# Patient Record
Sex: Female | Born: 1935 | Race: White | Hispanic: No | Smoking: Never smoker
Health system: Southern US, Community
[De-identification: ages and names within clinical notes are randomized; demographics above are authoritative.]

## PROBLEM LIST (undated history)

## (undated) DIAGNOSIS — I252 Old myocardial infarction: Secondary | ICD-10-CM

## (undated) DIAGNOSIS — I2510 Atherosclerotic heart disease of native coronary artery without angina pectoris: Secondary | ICD-10-CM

## (undated) DIAGNOSIS — E785 Hyperlipidemia, unspecified: Secondary | ICD-10-CM

## (undated) DIAGNOSIS — I10 Essential (primary) hypertension: Secondary | ICD-10-CM

## (undated) DIAGNOSIS — C4490 Unspecified malignant neoplasm of skin, unspecified: Secondary | ICD-10-CM

## (undated) DIAGNOSIS — R109 Unspecified abdominal pain: Secondary | ICD-10-CM

## (undated) DIAGNOSIS — J189 Pneumonia, unspecified organism: Secondary | ICD-10-CM

## (undated) DIAGNOSIS — D649 Anemia, unspecified: Secondary | ICD-10-CM

## (undated) DIAGNOSIS — I5030 Unspecified diastolic (congestive) heart failure: Secondary | ICD-10-CM

## (undated) DIAGNOSIS — I255 Ischemic cardiomyopathy: Secondary | ICD-10-CM

## (undated) DIAGNOSIS — N189 Chronic kidney disease, unspecified: Secondary | ICD-10-CM

## (undated) DIAGNOSIS — M1990 Unspecified osteoarthritis, unspecified site: Secondary | ICD-10-CM

## (undated) DIAGNOSIS — I499 Cardiac arrhythmia, unspecified: Secondary | ICD-10-CM

## (undated) DIAGNOSIS — G40909 Epilepsy, unspecified, not intractable, without status epilepticus: Secondary | ICD-10-CM

## (undated) DIAGNOSIS — G4733 Obstructive sleep apnea (adult) (pediatric): Secondary | ICD-10-CM

## (undated) HISTORY — DX: Ischemic cardiomyopathy: I25.5

## (undated) HISTORY — PX: EYE SURGERY: SHX253

## (undated) HISTORY — DX: Unspecified malignant neoplasm of skin, unspecified: C44.90

## (undated) HISTORY — DX: Unspecified abdominal pain: R10.9

## (undated) HISTORY — PX: KNEE SURGERY: SHX244

## (undated) HISTORY — DX: Obstructive sleep apnea (adult) (pediatric): G47.33

## (undated) HISTORY — DX: Cardiac arrhythmia, unspecified: I49.9

## (undated) HISTORY — PX: TOTAL ABDOMINAL HYSTERECTOMY: SHX209

## (undated) HISTORY — DX: Essential (primary) hypertension: I10

## (undated) HISTORY — PX: BACK SURGERY: SHX140

## (undated) HISTORY — PX: WRIST FRACTURE SURGERY: SHX121

## (undated) HISTORY — PX: CHOLECYSTECTOMY: SHX55

## (undated) HISTORY — DX: Old myocardial infarction: I25.2

---

## 1999-10-25 ENCOUNTER — Encounter: Admission: RE | Admit: 1999-10-25 | Discharge: 1999-10-25 | Payer: Self-pay | Admitting: Family Medicine

## 1999-10-25 ENCOUNTER — Encounter: Payer: Self-pay | Admitting: Family Medicine

## 2000-02-08 ENCOUNTER — Inpatient Hospital Stay (HOSPITAL_COMMUNITY): Admission: EM | Admit: 2000-02-08 | Discharge: 2000-02-13 | Payer: Self-pay | Admitting: Emergency Medicine

## 2000-02-08 ENCOUNTER — Encounter: Payer: Self-pay | Admitting: Emergency Medicine

## 2000-02-08 ENCOUNTER — Encounter (INDEPENDENT_AMBULATORY_CARE_PROVIDER_SITE_OTHER): Payer: Self-pay | Admitting: Specialist

## 2000-02-10 ENCOUNTER — Encounter: Payer: Self-pay | Admitting: Cardiology

## 2001-10-11 ENCOUNTER — Encounter: Admission: RE | Admit: 2001-10-11 | Discharge: 2001-10-11 | Payer: Self-pay | Admitting: Family Medicine

## 2001-10-11 ENCOUNTER — Encounter: Payer: Self-pay | Admitting: Family Medicine

## 2004-05-09 ENCOUNTER — Inpatient Hospital Stay (HOSPITAL_COMMUNITY): Admission: EM | Admit: 2004-05-09 | Discharge: 2004-05-12 | Payer: Self-pay | Admitting: Emergency Medicine

## 2005-02-16 ENCOUNTER — Encounter: Payer: Self-pay | Admitting: Pulmonary Disease

## 2006-01-22 ENCOUNTER — Ambulatory Visit: Payer: Self-pay | Admitting: Pulmonary Disease

## 2006-09-10 ENCOUNTER — Encounter: Admission: RE | Admit: 2006-09-10 | Discharge: 2006-10-09 | Payer: Self-pay | Admitting: Orthopedic Surgery

## 2006-10-22 ENCOUNTER — Ambulatory Visit (HOSPITAL_COMMUNITY): Admission: RE | Admit: 2006-10-22 | Discharge: 2006-10-23 | Payer: Self-pay | Admitting: Orthopedic Surgery

## 2008-05-22 ENCOUNTER — Encounter: Admission: RE | Admit: 2008-05-22 | Discharge: 2008-05-22 | Payer: Self-pay | Admitting: Family Medicine

## 2008-05-22 ENCOUNTER — Encounter: Payer: Self-pay | Admitting: Gastroenterology

## 2008-11-10 ENCOUNTER — Telehealth (INDEPENDENT_AMBULATORY_CARE_PROVIDER_SITE_OTHER): Payer: Self-pay | Admitting: *Deleted

## 2008-11-10 ENCOUNTER — Ambulatory Visit: Payer: Self-pay | Admitting: Gastroenterology

## 2008-11-10 DIAGNOSIS — R109 Unspecified abdominal pain: Secondary | ICD-10-CM

## 2008-11-11 ENCOUNTER — Encounter (INDEPENDENT_AMBULATORY_CARE_PROVIDER_SITE_OTHER): Payer: Self-pay | Admitting: *Deleted

## 2008-11-11 LAB — CONVERTED CEMR LAB
Albumin: 4 g/dL (ref 3.5–5.2)
Alkaline Phosphatase: 118 units/L — ABNORMAL HIGH (ref 39–117)
Eosinophils Relative: 1.6 % (ref 0.0–5.0)
Glucose, Bld: 112 mg/dL — ABNORMAL HIGH (ref 70–99)
MCV: 94.7 fL (ref 78.0–100.0)
Monocytes Absolute: 0.2 10*3/uL (ref 0.1–1.0)
Monocytes Relative: 3.9 % (ref 3.0–12.0)
Neutrophils Relative %: 74.2 % (ref 43.0–77.0)
Platelets: 158 10*3/uL (ref 150.0–400.0)
Potassium: 4.5 meq/L (ref 3.5–5.1)
Sodium: 143 meq/L (ref 135–145)
Total Protein: 7.5 g/dL (ref 6.0–8.3)
WBC: 5.4 10*3/uL (ref 4.5–10.5)

## 2008-11-19 ENCOUNTER — Encounter (INDEPENDENT_AMBULATORY_CARE_PROVIDER_SITE_OTHER): Payer: Self-pay | Admitting: *Deleted

## 2008-11-19 ENCOUNTER — Ambulatory Visit (HOSPITAL_COMMUNITY): Admission: RE | Admit: 2008-11-19 | Discharge: 2008-11-19 | Payer: Self-pay | Admitting: Gastroenterology

## 2008-11-19 ENCOUNTER — Ambulatory Visit: Payer: Self-pay | Admitting: Gastroenterology

## 2008-12-18 ENCOUNTER — Encounter: Admission: RE | Admit: 2008-12-18 | Discharge: 2008-12-18 | Payer: Self-pay | Admitting: Orthopedic Surgery

## 2009-01-05 ENCOUNTER — Ambulatory Visit: Payer: Self-pay | Admitting: Gastroenterology

## 2009-02-03 ENCOUNTER — Ambulatory Visit (HOSPITAL_BASED_OUTPATIENT_CLINIC_OR_DEPARTMENT_OTHER): Admission: RE | Admit: 2009-02-03 | Discharge: 2009-02-03 | Payer: Self-pay | Admitting: Orthopedic Surgery

## 2009-03-02 ENCOUNTER — Encounter: Admission: RE | Admit: 2009-03-02 | Discharge: 2009-03-31 | Payer: Self-pay | Admitting: Orthopedic Surgery

## 2010-05-24 NOTE — Procedures (Signed)
Summary: COLON prep/[facility]  [facility] prep/[facility]   Imported By: Lester [place]ates 11/13/2008 08:01:13  _____________________________________________________________________  External Attachment:    Type:   Image     Comment:   External Document

## 2010-05-30 ENCOUNTER — Ambulatory Visit (INDEPENDENT_AMBULATORY_CARE_PROVIDER_SITE_OTHER): Payer: Medicare Other | Admitting: Pulmonary Disease

## 2010-05-30 ENCOUNTER — Encounter: Payer: Self-pay | Admitting: Pulmonary Disease

## 2010-05-30 DIAGNOSIS — I499 Cardiac arrhythmia, unspecified: Secondary | ICD-10-CM | POA: Insufficient documentation

## 2010-05-30 DIAGNOSIS — G4733 Obstructive sleep apnea (adult) (pediatric): Secondary | ICD-10-CM

## 2010-05-30 DIAGNOSIS — J309 Allergic rhinitis, unspecified: Secondary | ICD-10-CM | POA: Insufficient documentation

## 2010-05-30 DIAGNOSIS — Z85828 Personal history of other malignant neoplasm of skin: Secondary | ICD-10-CM | POA: Insufficient documentation

## 2010-05-30 DIAGNOSIS — G4731 Primary central sleep apnea: Secondary | ICD-10-CM | POA: Insufficient documentation

## 2010-05-30 DIAGNOSIS — I10 Essential (primary) hypertension: Secondary | ICD-10-CM | POA: Insufficient documentation

## 2010-06-15 NOTE — Assessment & Plan Note (Signed)
Summary: self referral for management of osa   Copy to:  n/a Primary Provider/Referring Provider:  Charlesetta Shanks, MD   CC:  Sleep Consult.  Former Pt. Was last seen Oct 2007. Marland Kitchen  History of Present Illness: The pt is a 77y/o female who comes in today as a self referral for management of osa.  She has a diagnosis of severe osa established in 2006, with AHI 84/hr and including some central apneas.  She was started on Adapt SV by the attending sleep center, but ultimately came to me for f/u at a later date.  I would typically not leave her on this type of machine, however the pt felt she was doing well and wanted to continue.  She has now been lossed to f/u for 4+ years, and comes in today where she is in need of a new machine.  She has kept up with her mask changes and maintenance faithfully.  She currently feels that she sleeps well with her device prior to it having functioning issues, and feels rested the following day.  She is satisfied with her daytime alertness, and has no issues with sleepiness unless she reads for hours during the middle of the day.  Her weight has been fairly stable since the last visit.    Current Medications (verified): 1)  Fish Oil   Oil (Fish Oil) .Marland Kitchen.. 1000 Mg By Mouth Once Daily 2)  Folic Acid 800 Mcg Tabs (Folic Acid) .Marland Kitchen.. 1 By Mouth Once Daily 3)  Caltrate 600+d 600-400 Mg-Unit Tabs (Calcium Carbonate-Vitamin D) .Marland Kitchen.. 1 By Mouth Once Daily 4)  Super Omega 3 500 Mg Caps (Omega-3 Fatty Acids) .... Take 1 Tablet By Mouth Once A Day 5)  Aspir-Low 81 Mg Tbec (Aspirin) .Marland Kitchen.. 1 By Mouth Once Daily 6)  Dilantin 100 Mg Caps (Phenytoin Sodium Extended) .... 2 By Mouth At Bedtime 7)  Lisinopril 20 Mg Tabs (Lisinopril) .Marland Kitchen.. 1 By Mouth Once Daily 8)  Oxybutynin Chloride 5 Mg Tabs (Oxybutynin Chloride) .Marland Kitchen.. 1 By Mouth Once Daily 9)  Allegra 180 Mg Tabs (Fexofenadine Hcl) .Marland Kitchen.. 1 By Mouth Once Daily 10)  Felodipine 5 Mg Xr24h-Tab (Felodipine) .Marland Kitchen.. 1 By Mouth Once Daily 11)  Vitamin D  (Ergocalciferol) 50000 Unit Caps (Ergocalciferol) .Marland Kitchen.. 1 By Mouth Per Week 12)  Coenzyme Q10 10 Mg Caps (Coenzyme Q10) .Marland Kitchen.. 1 By Mouth Once Daily 13)  Acidophilus  Caps (Lactobacillus) .... As Needed Usually By Mouth Once Daily 14)  Flonase 50 Mcg/act  Susp (Fluticasone Propionate) .... Two Puffs Each Nostril Daily  Allergies (verified): 1)  ! Codeine  Past History:  Past Medical History:  ALLERGIC RHINITIS (ICD-477.9) SKIN CANCER, HX OF (ICD-V10.83) HYPERTENSION (ICD-401.9) OBSTRUCTIVE SLEEP APNEA (ICD-327.23)--AHI 84/hr in 2006 IRREGULAR HEART RATE (ICD-427.9) ABDOMINAL PAIN OTHER SPECIFIED SITE (ICD-789.09)    Past Surgical History: Reviewed history from 11/10/2008 and no changes required. Back Surgery Cholecystectomy Hysterectomy   Family History: Reviewed history from 11/10/2008 and no changes required. heart disease: father cancer: mother and father (skin)   Social History: Reviewed history from 11/10/2008 and no changes required. she is married and lives with husband. , she has one son,  she never smoked cigarettes,  she does not drink alcohol. retired from [employer].   Review of Systems       The patient complains of irregular heartbeats, acid heartburn, indigestion, nasal congestion/difficulty breathing through nose, sneezing, and joint stiffness or pain.  The patient denies shortness of breath with activity, shortness of breath at rest, productive  cough, non-productive cough, coughing up blood, chest pain, loss of appetite, weight change, abdominal pain, difficulty swallowing, sore throat, tooth/dental problems, headaches, itching, ear ache, anxiety, depression, hand/feet swelling, rash, change in color of mucus, and fever.    Vital Signs:  Patient profile:   77 year old female Height:      62 inches Weight:      161.25 pounds BMI:     29.60 O2 Sat:      98 % on Room air Temp:     98.2 degrees F oral Pulse rate:   82 / minute BP sitting:   124 / 80   (left arm) Cuff size:   regular  Vitals Entered By: Arman Filter LPN (May 30, 2010 9:27 AM)  O2 Flow:  Room air CC: Sleep Consult.  Former Pt. Was last seen Oct 2007.  Comments Medications reviewed with patient Arman Filter LPN  May 30, 2010 9:28 AM    Physical Exam  General:  ow female in nad Eyes:  PERRLA and EOMI.   Nose:  patent without discharge no purulence Mouth:  moderate elongation of soft palate and uvula Neck:  no jvd, tmg, LN Lungs:  clear to auscultation Heart:  rrr, no mrg Abdomen:  soft and nontender, bs+ Extremities:  no significant edema or cyanosis  pulses intact distally Neurologic:  alert, does not appear sleepy, moves all 4.   Impression & Recommendations:  Problem # 1:  OBSTRUCTIVE SLEEP APNEA (ICD-327.23) the pt has known mixed apnea, and has done well with adapt sv device.  She is wearing this complianlty, and has been keeping up with her supplies.  She does have some daytime sleepiness, but is not adversely impacting her QOL. I have reminded her to see me yearly (have not seen since 2007), and also to work on weight loss.  Will send an order to her DME to replace her machine.  Medications Added to Medication List This Visit: 1)  Super Omega 3 500 Mg Caps (Omega-3 fatty acids) .... Take 1 tablet by mouth once a day 2)  Flonase 50 Mcg/act Susp (Fluticasone propionate) .... Two puffs each nostril daily  Other Orders: New Patient Level III (16109) DME Referral (DME)  Patient Instructions: 1)  work on weight loss 2)  will send an order to dme for a new machine 3)  try turning up heat on humidifier for nasal congestion at night to see if that helps 4)  followup with me in one year, but call if having issues.

## 2010-07-28 LAB — POCT I-STAT 4, (NA,K, GLUC, HGB,HCT)
Hemoglobin: 13.9 g/dL (ref 12.0–15.0)
Sodium: 141 mEq/L (ref 135–145)

## 2010-09-06 NOTE — Op Note (Signed)
[name], [name]             ACCOUNT NO.:  [id number]   MEDICAL RECORD NO.:  [id number]          PATIENT TYPE:  OIB   LOCATION:  1607                         FACILITY:  [facility]   PHYSICIAN:  Marlowe Kays, M.D.  DATE OF BIRTH:  1930/01/29   DATE OF PROCEDURE:  10/22/2006  DATE OF DISCHARGE:                               OPERATIVE REPORT   PREOPERATIVE DIAGNOSIS:  Painful right 2nd claw toe.   POSTOPERATIVE DIAGNOSIS:  Painful right 2nd claw toe.   OPERATION:  Correction of right 2nd claw toe with resection of the base  of the proximal phalanx and pin fixation.   SURGEON:  Marlowe Kays, M.D.   ASSISTANT:  Nurse.   ANESTHESIA:  Spinal.   PATHOLOGY AND JUSTIFICATION FOR PROCEDURE:  As stated in diagnosis.  She  had dorsal subluxation of the proximal phalanx on the 2nd metatarsal  head.   PROCEDURE:  Prophylactic antibiotics, satisfactory spinal anesthesia and  pneumatic tourniquet, right lower extremity was prepped with DuraPrep  from midcalf to toes after esmarching the leg out nonsterilely.  Foot  and ankle were then draped in a sterile field.  I made the dorsal  extensor splitting incision from the distal portion of the proximal  phalanx over the 1st metatarsal head.  The capsule of the MP joint was  opened and the proximal phalanx isolated with subperiosteal dissection.  Baby Homan retractors were placed beneath the proximal portion of the  proximal phalanx and I then used a micro saw to cut the proximal phalanx  and roughly junction mid proximal 3rds, which is what it took to correct  the deformity.  I had to take the base of proximal phalanx out piecemeal  because it was so fragile and would not hold clamps.  I checked to be  sure that there were no fragments of bone remaining.  The wound was then  irrigated well with sterile saline and soft tissues infiltrated and  additional spinal anesthetic with 0.5% plain Marcaine.  I then placed a  0.045 smooth K-wire from  proximal to distal and then distal to proximal,  stabilizing the 2nd toe in a corrected position.  I released the  tourniquet and the 2nd toe pinked up nicely.  The pin was bent and cut  and capped.  I then closed the incision with running 3-0 Vicryl in the  extensor tendon mechanism and interrupted 4-0 nylon in the skin and  subcutaneous tissue.  Betadine, Adaptic and dry sterile dressing were  applied.  She tolerated the procedure well and was taken to the recovery  room in satisfactory condition with no known complications.           ______________________________  Marlowe Kays, M.D.     JA/MEDQ  D:  10/22/2006  T:  10/23/2006  Job:  [id number]

## 2010-09-09 NOTE — Discharge Summary (Signed)
NAMEMENNA, ABELN             ACCOUNT NO.:  [id number]   MEDICAL RECORD NO.:  [id number]          PATIENT TYPE:  INP   LOCATION:  6733                         FACILITY:  [facility]   PHYSICIAN:  Melissa L. Ladona Ridgel, MD  DATE OF BIRTH:  04-24-1930   DATE OF ADMISSION:  05/09/2004  DATE OF DISCHARGE:  05/12/2004                                 DISCHARGE SUMMARY   DISCHARGE DIAGNOSES:  1.  Gastroenteritis, likely viral in etiology.  There still remain some      cultures pending which we will follow up as an outpatient.  The      patient's Clostridium difficile cultures have been.  The patient is      tolerating solid food and has no further symptoms of diarrhea, she was      actually able to have a formed stool.  She will be discharged to home on      no medications.  2.  Hypertension.  The patient was transiently kept off of her lisinopril      and did have some episodes of hypertension, systolically in the 170s.      With resumption of lisinopril 10 mg, her blood pressures have been      appropriate and stable in the 110-120 systolic range.  I would discharge      her home on lisinopril 10 mg.  At this time, she is unclear of the      dosing of her lisinopril - she suggests that perhaps it might be 30 mg      but since she is recently recovering from dehydration I would prefer to      keep her on a lower dose until she is back eating and drinking and      functioning at a normal level.  I would, therefore, ask her primary care      physician to up titrate her medications as needed.  3.  History of seizure disorder.  The patient states that she had head      trauma in the past and was prophylactically placed on a seizure      medication because of some mild jerking in her hands which was felt to      represent seizure activity.  During the course of the hospital stay she      initially was kept off of her Dilantin as her liver enzymes were      elevated into the 50s, AST and ALT.  The  Dilantin was resumed on the day      after admission and at this time her liver enzymes have returned to      normal.  I suspect that the LFT elevation was related to the nausea and      vomiting.  There is, however, a hepatitis panel pending to assess      whether or not this may have been a cause for her initial illness.  I      will follow up on this with her primary care physician.   DISCHARGE MEDICATIONS:  1.  Lisinopril 10 mg daily.  2.  Dilantin 200 mg p.o. q.h.s.   I will request the patient see her primary care physician, Dr. Modesto Charon, in the  next 1-2 weeks to follow up on this hospital admission.  She will be  instructed to return to her primary care physician's office or the emergency  room if she develops fevers, chills, nausea, vomiting or further diarrhea.   HISTORY OF PRESENT ILLNESS:  This is a 77 year old white female with a past  medical history as stated for hypertension and seizure disorder.  She  presented to the emergency room with a 4-day history of nausea, vomiting and  diarrhea.  The patient states that prior to her ED visit she awoke feeling  well, she relates getting up and having a [place] sandwich and then developed  an episode of diarrhea.  The diarrhea became more frequent and loose.  She  stated that she then developed some nausea and vomiting, she saw her  outpatient physician who gave her conservative medication for diarrhea  without improvement.  She was unable to take the oral medication and,  therefore, came to the emergency room for further evaluation.   In the emergency room, she was found to be dehydration with a BUN of 32 and  a creatinine of 1.0.  Physically, she did appear dehydrated with dry mucous  membranes and a UA specific gravity of 1.023.  She also had 80 ketones in  her urine.  Because of her protracted symptoms and inability to keep pills  down as well as liquids, she was admitted to the hospital for further  evaluation.   HOSPITAL  COURSE:  During the course of the hospital stay the patient was  rehydrated, she was started back on clear liquids and then advanced to a  full diet which she has tolerated well.  She was given two doses of Questran  to assist with the formation of more formed stool.  C. difficile cultures  remained negative throughout the hospital course.  She was not started on  any antibiotics because she did not display any white count or fever and it  was felt that her symptoms were more related to a viral source than a  bacterial source.  At the time of discharge the patient has provided with  stool samples which will be sent for bacterial studies, Salmonella, Shigella  and Campylobacter.  We will also follow up on her hepatitis panel and let  her physician know if there are any gross abnormalities.   At the time of discharge the patient appears stable clinically, her vital  signs are T-max 98.6, temperature 97, blood pressure 121/78, pulse 72,  respirations 20, saturations 97% on room air.  Generally, she appears well  with good color in her cheeks.  Mucous membranes are moist.  Her neck is  supple, there is no JVD, no lymph nodes, pupils equal round and reactive to  light, extraocular movements are intact, chest is clear to auscultation,  there are no rhonchi, rales or wheezes, and cardiovascular is regular rate  and rhythm with positive S1/S2, no S3 or S4.  There may be a faint 1/6  systolic ejection murmur heard today in the right second intercostal space,  otherwise she has had no obvious murmurs.  Abdomen is soft, nontender, non-  distended with positive bowel sounds.  Extremities show no clubbing,  cyanosis or edema.  Neurologically, she is nonfocal, cranial nerves II-XII  are intact.  Extremities show 2+ pulses and mild hyperpigmentation of the  left  greater than right lower extremity consistent with possible stasis  dermatitis.  PERTINENT LAB VALUES:  At the time of discharge her hemoglobin  is 11.3,  hematocrit 33, white count mildly decreased at 3.5 with platelets of  136,000.  BUN on the day of discharge is 9 with a creatinine of 0.7, sodium  142, potassium 4.0.  Her discharging LFTs reveal an AST of 30 and ALT of 34,  albumin decreased at 2.9.  Of note, her admitting liver panel revealed AST  60, ALT 58.  These findings were without signs or symptoms of obvious  gallbladder disease.   At this time the patient is deemed stable for discharge with a diagnosis of  viral gastroenteritis.  We will follow up on her bacterial cultures but I  doubt that we will see any positivity.  I will make contact with the  patient's primary care physician if there is a positive finding that needs  intervention.      MLT/MEDQ  D:  05/12/2004  T:  05/12/2004  Job:  [id number]   cc:   Maryla Morrow. Modesto Charon, M.D.  [address]  [place]  [place] [address]  Fax: [phone]

## 2010-09-09 NOTE — Op Note (Signed)
[place]  Patient:    [name], [name]                    MRN: [id number] Adm. Date:  [id number] Attending:  Katha Cabal CC:         ALFRED B. LITTLE, M.D.   Operative Report  PREOPERATIVE  DIAGNOSIS:  Acute cholecystitis.  POSTOPERATIVE DIAGNOSIS:  Acute gangrenous gallbladder, acute gangrenous cholecystitis.  PROCEDURE:  Laparoscopic cholecystectomy.  SURGEON:  Thornton Park. Daphine Deutscher, M.D.  ANESTHESIA:  General endotracheal.  FINDINGS:  Normal intraoperative cholangiogram, gangrenous full thickness wall with a purulent bowel.  DESCRIPTION OF PROCEDURE:   Ms. Hebdon was taken to the OR 16 on Friday afternoon, October 19, and given general anesthesia.  Preoperatively she had been treated with Unasyn.  I went through the umbilicus with a pursestring suture and entered the abdomen and inflated.  Found a large omental pack walling off the gallbladder and after stripping it away there was a greenish brown patch on the fundus consistent with transmural necrosis of the gangrenous gallbladder.  The rest of the gallbladder was edematous and red speckled color.  This was first decompressed with a needle and the suction device.  It was grasped and elevated as best I could.  I went down and dissected out the infundibulum.  I pealed it away from the omental pack and was fortunately able to see a cystic duct readily.  I put a clip up on the gallbladder.  I incised the cystic duct and inserted a Reddick catheter through which I took a dynamic cholangiogram, which showed good intrahepatic filling and prompt flow into the duodenum.  I triple clipped the cystic duct, divided it, triple clipped the cystic artery, divided it and removed the gallbladder from the gallbladder bed using the electrocautery.  I did have a little bleed on the medial aspect of the liver edge which I controlled with the electrocautery and a clip.  The gallbladder was  essentially necrotic and pealed away.  There was a lot of vapor which clouded the camera at times but we were able to prevail and detach this and place it in a bag.  I then went in and irrigated and surveyed the gallbladder bed.  No active bleeding or bowel leaks were seen.  I put in a 35 [place] Blake drain and brought it out through the lateral most port.  This was sutured to the skin with a 4-0 nylon.  The bag containing the gangrenous gallbladder was brought out through the umbilicus and the pursestring suture was tied down under direct vision.  The abdomen was deflated and the remaining portion of the gas was removed. The wound were closed with 4-0 Vicryl with Benzoin and Steri-Strips.  The patient seemed to tolerate the procedure well and was taken to the recovery room in satisfactory condition. DD:  02/10/00 TD:  02/12/00 Job: [id number]

## 2010-09-09 NOTE — Discharge Summary (Signed)
[place]  Patient:    TISHA, CLINE                   MRN: [id number] Adm. Date:  02/08/00 Disc. Date: 02/13/00 Attending:  Thornton Park. Daphine Deutscher, M.D.                           Discharge Summary  ADMISSION DIAGNOSIS:  Chest pain, rule out cardiac disease.  DISCHARGE DIAGNOSIS:  Gangrenous cholecystitis.  HOSPITAL COURSE:  The patient is a 77 year old gentleman admitted to Dr. Selmer Dominion service.  He underwent a heart catheterization which did not show any cardiac findings that would explain his chest pain.  An ultrasound showed evidence of cholecystitis.  He was seen by me, and on February 10, 2000 underwent a laparoscopic cholecystectomy and intraoperative cholangiogram which showed a gangrenous gallbladder.  Postoperatively, he did relatively well and was ready for discharge on February 13, 2000.  I had placed a drain, and this was removed.  He was given Tylox for pain and instructed to return to the office in two weeks.  FINAL DIAGNOSIS:  Gangrenous cholecystitis status post laparoscopic cholecystectomy and intraoperative cholangiogram  CONDITION ON DISCHARGE:  Improved. DD:  07/03/00 TD:  07/04/00 Job: [id number]

## 2010-09-09 NOTE — H&P (Signed)
[name], [name]             ACCOUNT NO.:  [id number]   MEDICAL RECORD NO.:  [id number]          PATIENT TYPE:  EMS   LOCATION:  [facility]                         FACILITY:  [facility]   PHYSICIAN:  Deirdre Peer. Polite, M.D. DATE OF BIRTH:  [birth year]   DATE OF ADMISSION:  05/09/2004  DATE OF DISCHARGE:                                HISTORY & PHYSICAL   CHIEF COMPLAINT:  Diarrhea, nausea and vomiting.   HISTORY OF PRESENT ILLNESS:  77 year old female with a past medical history  of hypertension, seizure disorder who presents to the ED for evaluation  after having diarrhea, nausea and vomiting since last Thursday.  The patient  states she was in her usual state of health until last Wednesday when she  woke up not feeling well.  The patient states she ate a Molly [place]  sandwich and later, the following day, had episodes of diarrhea, at most up  to 12 times.  Initially loose ultimately becoming watery.  The patient  stated that the symptoms lasted all weekend and on Saturday, had some nausea  and vomiting.  The patient saw outpatient MD and given conservative meds for  diarrhea without improvement.  Since then, the patient has not been able to  take any oral meds or keep anything down.  The patient presents to the ED  for evaluation.   In the ED, the patient is alert and oriented but does appear to be  dehydrated.  Labs show a creatinine of 1, BUN 32, glucose 85, otherwise,  electrolytes within normal limits.  The patient's labs show elevated LFTs  with AST 79 and ALT 71 with a normal bilirubin.  The patient's UA shows  specific gravity of 1.023, greater than 80 ketones, trace leukocyte  esterase, 0 to 2 white blood cells.  Dilantin level 4.5.  Because of the  patient's protracted symptoms of diarrhea, now nausea and vomiting,  inability to keep any p.o. down, the patient [name] require admission for  further evaluation and treatment.  The patient denies any chills, does admit  to feeling  somewhat warm and thinks she may have had a fever at the doctors  office.  She denies any recent upper respiratory infection, she had a mild  earache which she treated herself a week or so ago.  She denies any chest  pain, no shortness of breath.  The patient denies any recent antibiotics.  She does have well water at home which her husband also consumes and has not  had any trouble.  She denies any foreign travel.  As stated because of the  protracted nature of the symptoms, admission is deemed necessary for further  evaluation and treatment.   PAST MEDICAL HISTORY:  As stated above, significant for hypertension,  history of seizure disorder approximately four years ago after having head  trauma.   MEDICATIONS:  On admission include Lisinopril and Dilantin.   PAST SURGICAL HISTORY:  Significant for cholecystectomy approximately 4-5  years ago,  hysterectomy approximately 25 years ago.  The patient had back  surgery of the lower back for herniated disc.   ALLERGIES:  The  patient states she is allergic to codeine which causes  nausea and vomiting.   REVIEW OF SYMPTOMS:  As stated in the HPI.   FAMILY HISTORY:  Essentially noncontributory.   PHYSICAL EXAMINATION:  VITAL SIGNS:  Temperature 96.9, blood pressure 160/52, pulse 90, respiratory  rate 18, saturation 96%.  HEENT:  Significant for dry oral mucosa, otherwise, within normal limits.  CHEST:  Clear to auscultation.  HEART:  Regular, no S3.  ABDOMEN:  Soft, nontender, no hepatosplenomegaly.  EXTREMITIES:  No cyanosis, clubbing, and edema.   LABORATORY DATA:  White count 5.1, hemoglobin 14.5, hematocrit 42.4, MCV  90.9, platelets 147.  BMP sodium 139, potassium 3.7, chloride 104, CO2 22,  glucose 85, BUN 32, creatinine 1, calcium 8.8, total protein 7, albumin 4,  AST 79, ALT 71, alkaline phos 126, bilirubin 0.9.  Dilantin level 4.5.  UA  with specific gravity 1.023, greater than 80 ketones, nitrite negative,  leukocyte  esterase trace, 0-2 white blood cells per high powered field.   ASSESSMENT:  1.  Diarrhea.  2.  Nausea and vomiting, must rule out viral gastroenteritis versus other      infectious etiology.  3.  Hypertension.  4.  Seizure disorder on Dilantin status post head trauma.  5.  Elevated LFTs without signs of jaundice.   RECOMMENDATIONS:  The patient admitted to a medicine floor bed, [name] give  judicious IV fluids, [name] check stool O&P, WBC, and C. diff.  [name] repeat  Dilantin level.  [name] also check repeat LFTs if LFTs remain elevated on her  hepatitis panel.  Also, if LFTs remain elevated, may need to consider  discontinuing Dilantin.  [name] make further recommendations after review of  the above studies.      RDP/MEDQ  D:  05/09/2004  T:  05/09/2004  Job:  [id number]

## 2010-09-09 NOTE — Discharge Summary (Signed)
[place]  Patient:    [name], [name]                    MRN: [id number] Adm. Date:  [id number] Disc. Date: [id number] Attending:  Katha Cabal CC:         Thereasa Solo. Little, M.D.  Carolyne Fiscal, M.D.   Discharge Summary  HISTORY OF PRESENT ILLNESS:  This is a 77 year old lady with known history of coronary artery disease who presented with chest pain and underwent cardiac catheterization.  As a result of the catheter, it was found that she had not significant coronary artery disease and had a gallbladder ultrasound showing acute cholecystitis.  Therefore, she was taken to the operating room on February 10, 2000, and underwent a laparoscopic cholecystectomy with intraoperative cholangiogram with findings of a gangrenous gallbladder with omentum neatly walling that area off.  Postoperatively, she did very well.  She was ready for discharge on February 13, 2000.  She had a drain placed at the time of surgery and this was removed prior to discharge.  FOLLOW-UP:  She was asked to return to the office in two weeks.  DISCHARGE MEDICATIONS:  She was given a prescription for Tylenol to take for pain as needed.  FINAL DIAGNOSIS:  Gangrenous cholecystitis, status post laparoscopic cholecystectomy. DD:  02/28/00 TD:  02/29/00 Job: [id number]

## 2010-09-09 NOTE — Assessment & Plan Note (Signed)
[facility]                               PULMONARY OFFICE NOTE   NAME:PREVATTTeasha, [name]                    MRN:          [id number]  DATE:01/22/2006                            DOB:          [birth year]    HISTORY OF PRESENT ILLNESS:  The patient is a 77 year old female who I have  been asked to see for management of obstructive sleep apnea.  The patient  was first diagnosed with sleep apnea in October 2006 where she was found to  have a respiratory disturbance index of approximately 84 events per hour and  O2 desaturation as low as 82%.  She primarily had obstructive apneas and  mixed apneas with a small number of centrals.  She was initially tried on  BiPAP during her titration; however, the central events persisted.  She was  then changed over to an Adapt SV machine with a pressure of 8/1.  Currently,  the patient is on her machine which she wears every night.  She uses a nasal  mask with a heated humidifier and obtains her supplies from [place] in  [place].  The patient typically gets to bed between 11:30 p.m. and 12  a.m. and gets up at 7:30 a.m. to start her day.  Overall, she feels fairly  rested but thinks that she may be able to feel a little better.  She is  satisfied with her degree of alertness during the day, although she will  occasionally get sleepy at times.  She feels this really does not adversely  impact her quality of life.  She has no sleepiness with driving.  Of note,  her weight is up about 10 pounds over the last 2 years.   PAST MEDICAL HISTORY:  Significant for:  1. Hypertension.  2. History of allergic rhinitis.  3. History of sleep apnea as stated above.  4. Status post cholecystectomy and hysterectomy.  5. Status post back surgery.   CURRENT MEDICATIONS:  1. Benicar 40 mg daily.  2. Dilantin 50 mg five tablets q.h.s.  3. Amlodipine 10 mg daily.  4. Vesicare 5 mg daily.  5. Fexofenadine 180 mg daily.   The  patient is intolerant to CODEINE.   SOCIAL HISTORY:  She is married, she has never smoked.   FAMILY HISTORY:  Remarkable for heart disease in both of her parents.  Her  father had prostate cancer.   REVIEW OF SYSTEMS:  As per history of present illness.  Also, see patient  intake form documented in the chart.   PHYSICAL EXAMINATION:  GENERAL:  This is a well-developed female in no acute  distress.  VITAL SIGNS:  Blood pressure is 142/74, pulse 60, temperature 97.7, weight  is 151 pounds, O2 saturation on room air is 97%.  HEENT:  Pupils equal, round, and reactive to light and accommodation.  Extraocular muscles are intact.  Nares are patent without difficulty.  Oropharynx does show mild elongation of the soft palate and uvula with a  very small lower jaw with overbite.  NECK:  Supple without lymphadenopathy, thyromegaly or JVD.  CHEST:  Totally clear.  CARDIAC:  Reveals regular rate and rhythm.  ABDOMEN:  Soft, nontender, with good bowel sounds.  GENITAL, RECTAL, BREAST:  Not done, not indicated.  LOWER EXTREMITIES:  Show trace edema and pulses are intact distally.  NEUROLOGIC:  She is alert and oriented without obvious motor deficit.   IMPRESSION:  History of severe obstructive sleep apnea with her obstructive  events much greater than her central events.  The patient currently is on an  Adapt SV machine and overall seems fairly satisfied with her quality of  life.  I have had a long discussion with her about the difference between  obstructive sleep apnea and central sleep apnea, and how we usually do not  worry about central events unless they are definitely disruptive to her  sleep.  I think that her Adapt SV machine is probably overkill, but the  patient seems to be satisfied with her level of alertness and has not had  any issues with her mask or machine.  At this point in time we will continue  on the current pathology.   PLAN:  1. Will obtain the actual raw data from  [facility]      for further review.  2. The patient will follow up in approximately 6 months or sooner if there      are problems.            ______________________________  Barbaraann Share, MD,FCCP      [facility]/MedQ  DD:  02/12/2006  DT:  02/12/2006  Job #:  [id number]   cc:   Armanda Magic, M.D.  Dr. Celene Skeen in [facility]

## 2010-09-09 NOTE — Cardiovascular Report (Signed)
[place]  Patient:    GRACY, EHLY                    MRN: [id number] Proc. Date: 02/09/00 Adm. Date:  [id number] Attending:  Loreli Dollar CC:         Carolyne Fiscal, M.D.  Cath Lab   Cardiac Catheterization  INDICATIONS FOR PROCEDURE:  Ms. Pownall is a 77 year old female who presented to the emergency room after having severe epigastric and lower chest pain.  It occurred at rest.  Nitroglycerin x 3 did not make a significant improvement, but GI cocktail did.  Her cardiac enzymes were negative, and her EKG was unremarkable.  She has had, in 1990, a nuclear stress test that was false positive and underwent a cardiac catheterization that showed no CAD at that time.  After discussing with Dr. Duaine Dredge, the decision was made to proceed on with cardiac catheterization.  His perception was that she had complained of intermittent chest pain off and on for years and knowing that a false positive stress test would be the outcome of a noninvasive evaluation, arrangements were made to proceed on with cardiac catheterization.  DESCRIPTION OF PROCEDURE:  The patient was prepared and draped in the usual sterile fashion exposing the right groin.  Following local anesthetic with 1% Xylocaine the Seldinger technique was employed, and a 6 [place] introducer sheath was placed in the right femoral artery.  Selective right and left coronary arteriography and ventriculography in the RAO projection were performed; 6 French Judkins configuration catheters were used.  COMPLICATIONS:  None.  1. Left heart catheterization. 2. Selective right and left coronary arteriography. 3. Ventriculography.  RESULTS: 1. Hemodynamic monitoring:  Central aortic pressure was 146/61, left    ventricular pressure was 146/10.  There was no aortic valve gradient noted    at the time of pullback. 2. Ventriculography:  Ventriculography in the RAO projection revealed  normal    left ventricular systolic function except the posterior basilar segment    appeared to be hypokinetic to akinetic.  The ejection fraction calculated    at 55%.  The end-diastolic pressure was 10.  CORONARY ARTERIOGRAPHY:  There was minor calcification noted in the mid portion of the RCA.  1. Left main:  Normal. 2. LAD.  The LAD had some minor proximal irregularities.  The vessel    trifurcated in its mid portion and was free of significant disease.  The    proximal portion of the LAD was about 3.5 to 4 mm in diameter. 3. Circumflex:  The circumflex was a large vessel of about 4 mm with minor    proximal irregularities and one distal OM branch which was free of disease. 4. Optimal diagonal:  3.0 to 3.5 mm vessel free of significant disease. 5. RCA:  The RCA had minor calcification in the proximal portion.  There were    mild irregularities in the proximal, mid, and distal vessel but no    significant obstructions.  The PDA and posterior lateral branches were free    of disease.  CONCLUSIONS: 1. Normal left ventricular systolic function with ejection fraction of 55%;    however, there was a posterior basilar wall motion abnormality. 2. No evidence of significant CAD.  DISCUSSION:  At this point, I see no reason the patient cannot be discharged to home with negative cardiac enzymes.  Would keep her on GI medications and make sure her blood pressure is well controlled.  I will reevaluate her in the office tomorrow with plans for long-term follow-up by Dr. Duaine Dredge. DD:  02/09/00 TD:  02/09/00 Job: [id number]

## 2011-02-08 LAB — BASIC METABOLIC PANEL
BUN: 17
Chloride: 105
GFR calc Af Amer: >60
Potassium: 4.5
Sodium: 148 — ABNORMAL HIGH

## 2011-02-08 LAB — PROTIME-INR
INR: 1.1
Prothrombin Time: 14.4

## 2011-02-08 LAB — HEMOGLOBIN AND HEMATOCRIT, BLOOD
HCT: 38.3
Hemoglobin: 13.1

## 2011-02-08 LAB — APTT: aPTT: 28

## 2011-05-29 ENCOUNTER — Encounter: Payer: Self-pay | Admitting: Pulmonary Disease

## 2011-05-30 ENCOUNTER — Encounter: Payer: Self-pay | Admitting: Pulmonary Disease

## 2011-05-30 ENCOUNTER — Ambulatory Visit (INDEPENDENT_AMBULATORY_CARE_PROVIDER_SITE_OTHER): Payer: Medicare Other | Admitting: Pulmonary Disease

## 2011-05-30 VITALS — BP 144/76 | HR 60 | Temp 97.7°F | Ht 65.0 in | Wt 157.0 lb

## 2011-05-30 DIAGNOSIS — G4733 Obstructive sleep apnea (adult) (pediatric): Secondary | ICD-10-CM

## 2011-05-30 NOTE — Patient Instructions (Signed)
Continue on your current device, keep up with mask changes Work on weight reduction If doing well, followup with me in one year.

## 2011-05-30 NOTE — Progress Notes (Signed)
  Subjective:    Patient ID: [name], female    DOB: 18-May-1929, 78 y.o.   MRN: [id number]  HPI The patient comes in today for followup of her sleep apnea.  She's been wearing her adapt SV compliantly, and does not have any issues with her mask fit or pressure delivery.  She feels that she sleeps well, and is fairly rested in the mornings.  She is satisfied with her daytime alertness.   Review of Systems  Constitutional: Negative for fever and unexpected weight change.  HENT: Positive for congestion and sneezing. Negative for ear pain, nosebleeds, sore throat, rhinorrhea, trouble swallowing, dental problem, postnasal drip and sinus pressure.   Eyes: Positive for redness and itching.  Respiratory: Positive for cough. Negative for chest tightness, shortness of breath and wheezing.   Cardiovascular: Positive for leg swelling. Negative for palpitations.  Gastrointestinal: Negative for nausea and vomiting.  Genitourinary: Negative for dysuria.  Musculoskeletal: Negative for joint swelling.  Skin: Negative for rash.  Neurological: Negative for headaches.  Hematological: Bruises/bleeds easily.  Psychiatric/Behavioral: Negative for dysphoric mood. The patient is not nervous/anxious.        Objective:   Physical Exam Overweight female in no acute distress No skin breakdown or pressure necrosis from the CPAP mask Lower extremities without edema, cyanosis Alert, moves all 4 extremities.       Assessment & Plan:

## 2011-05-30 NOTE — Assessment & Plan Note (Signed)
The patient is doing very well from a sleep apnea standpoint on her current positive pressure device.  I have asked her to continue on this, to work on weight reduction, and to keep up with her mask changes and supplies.  She will followup with me in one year, or sooner if having issues.

## 2011-07-12 DIAGNOSIS — K589 Irritable bowel syndrome without diarrhea: Secondary | ICD-10-CM | POA: Insufficient documentation

## 2011-07-12 DIAGNOSIS — L84 Corns and callosities: Secondary | ICD-10-CM | POA: Insufficient documentation

## 2011-07-12 DIAGNOSIS — J301 Allergic rhinitis due to pollen: Secondary | ICD-10-CM | POA: Insufficient documentation

## 2011-07-12 DIAGNOSIS — M25579 Pain in unspecified ankle and joints of unspecified foot: Secondary | ICD-10-CM | POA: Insufficient documentation

## 2011-07-12 DIAGNOSIS — K5712 Diverticulitis of small intestine without perforation or abscess without bleeding: Secondary | ICD-10-CM | POA: Insufficient documentation

## 2011-07-12 DIAGNOSIS — M546 Pain in thoracic spine: Secondary | ICD-10-CM | POA: Insufficient documentation

## 2011-07-12 DIAGNOSIS — D649 Anemia, unspecified: Secondary | ICD-10-CM | POA: Insufficient documentation

## 2011-08-07 ENCOUNTER — Encounter (HOSPITAL_COMMUNITY): Payer: Self-pay | Admitting: *Deleted

## 2011-08-07 ENCOUNTER — Emergency Department (HOSPITAL_COMMUNITY)
Admission: EM | Admit: 2011-08-07 | Discharge: 2011-08-08 | Disposition: A | Discharge status: Home / Self Care | Payer: Medicare Other | Attending: Emergency Medicine | Admitting: Emergency Medicine

## 2011-08-07 ENCOUNTER — Emergency Department (HOSPITAL_COMMUNITY): Payer: Medicare Other

## 2011-08-07 DIAGNOSIS — S52202A Unspecified fracture of shaft of left ulna, initial encounter for closed fracture: Secondary | ICD-10-CM

## 2011-08-07 DIAGNOSIS — S52509A Unspecified fracture of the lower end of unspecified radius, initial encounter for closed fracture: Secondary | ICD-10-CM

## 2011-08-07 DIAGNOSIS — Y92009 Unspecified place in unspecified non-institutional (private) residence as the place of occurrence of the external cause: Secondary | ICD-10-CM | POA: Insufficient documentation

## 2011-08-07 DIAGNOSIS — S52209A Unspecified fracture of shaft of unspecified ulna, initial encounter for closed fracture: Secondary | ICD-10-CM | POA: Insufficient documentation

## 2011-08-07 DIAGNOSIS — I10 Essential (primary) hypertension: Secondary | ICD-10-CM | POA: Insufficient documentation

## 2011-08-07 DIAGNOSIS — W010XXA Fall on same level from slipping, tripping and stumbling without subsequent striking against object, initial encounter: Secondary | ICD-10-CM | POA: Insufficient documentation

## 2011-08-07 DIAGNOSIS — S52599A Other fractures of lower end of unspecified radius, initial encounter for closed fracture: Secondary | ICD-10-CM | POA: Insufficient documentation

## 2011-08-07 MED ORDER — OXYCODONE-ACETAMINOPHEN 5-325 MG PO TABS
1.0000 | ORAL_TABLET | Freq: Once | ORAL | Status: AC
  Administered 2011-08-07: 1 via ORAL
  Filled 2011-08-07: qty 1

## 2011-08-07 MED ORDER — BUPIVACAINE HCL (PF) 0.5 % IJ SOLN
30.0000 mL | Freq: Once | INTRAMUSCULAR | Status: AC
  Administered 2011-08-07: 30 mL
  Filled 2011-08-07: qty 30

## 2011-08-07 MED ORDER — HYDROCODONE-ACETAMINOPHEN 5-325 MG PO TABS: 1.0000 | ORAL_TABLET | ORAL | Status: AC | PRN

## 2011-08-07 MED ORDER — PROPOFOL 10 MG/ML IV BOLUS: 1.0000 mg/kg | Freq: Once | INTRAVENOUS | Status: DC

## 2011-08-07 MED ORDER — FENTANYL CITRATE 0.05 MG/ML IJ SOLN
50.0000 ug | Freq: Once | INTRAMUSCULAR | Status: AC
  Administered 2011-08-07: 50 ug via INTRAVENOUS
  Filled 2011-08-07: qty 2

## 2011-08-07 MED ORDER — PROPOFOL 10 MG/ML IV BOLUS
1.0000 mg/kg | Freq: Once | INTRAVENOUS | Status: AC
  Administered 2011-08-07: 30 mg via INTRAVENOUS
  Filled 2011-08-07: qty 20
  Filled 2011-08-07: qty 6.97

## 2011-08-07 MED ORDER — PROPOFOL BOLUS VIA INFUSION: 1.0000 mg/kg | Freq: Once | INTRAVENOUS | Status: DC

## 2011-08-07 NOTE — ED Notes (Signed)
XR at bedside for post reduction films.

## 2011-08-07 NOTE — ED Provider Notes (Signed)
Patient received from [facility], PA-C, at change of shift.  This is a shared visit with Dr Rubin Payor.  Patient with "dorsally displaced and angulated fracture of the distal radius" and ulnar styloid fracture.  Wrist reduced by Dr Rubin Payor with my assistance.  Post-reduction film shows some improvement.  Splint placed by ortho tech. Patient prefers to follow up with her orthopedist Dr Simonne Come.  Dr Rubin Payor has reviewed the post-reduction film and advises d/c home with follow up with Dr Simonne Come.  Patient reports she has taken hydrocodone without any problems in the past.  Patient understands that she need to follow up with Dr Simonne Come first thing in the morning.  Discussed reasons for immediate return, importance of close follow up.  Patient verbalizes understanding and agrees with plan.     Results for orders placed during the hospital encounter of 02/03/09  POCT I-STAT 4, (NA,K, GLUC, HGB,HCT)      Component Value Range   Sodium 141  135 - 145 (mEq/L)   Potassium 4.3  3.5 - 5.1 (mEq/L)   Glucose, Bld 104 (*) 70 - 99 (mg/dL)   HCT 78.4  69.6 - 29.5 (%)   Hemoglobin 13.9  12.0 - 15.0 (g/dL)   Dg Wrist Complete Left  08/07/2011  *RADIOLOGY REPORT*  Clinical Data: Status post reduction of left wrist fracture.  LEFT WRIST - COMPLETE 3+ VIEW  Comparison: Left wrist radiographs performed earlier today at 06:09 p.m.  Findings: There is perhaps slightly improved alignment of the comminuted fracture through the distal radius, though significant dorsal angulation and displacement, and radial displacement, are again noted, with mild underlying impaction.  The ulnar styloid fracture is unchanged in appearance.  The carpal rows remain grossly intact, though fracture lines extend to the radiocarpal joint.  The soft tissues are not well assessed due to the overlying splint.  IMPRESSION: Perhaps slightly improved alignment of the comminuted fracture through the distal radius, though significant dorsal  angulation and displacement, and radial displacement, are again noted, with mild underlying impaction.  Stable appearance to ulnar styloid fracture.  Original Report Authenticated By: Tonia Ghent, M.D.   Dg Wrist Complete Left  08/07/2011  *RADIOLOGY REPORT*  Clinical Data: Fall.  Wrist pain and swelling.  LEFT WRIST - COMPLETE 3+ VIEW  Comparison: None.  Findings: The patient has a dorsally displaced and angulated fracture of the distal radius.  Ulnar styloid fracture is also identified.  No other acute bony or joint abnormality is seen. Calcifications about the second and third MCP joints is consistent with chronic chondrocalcinosis.  Chondrocalcinosis of the triangular fibrocartilage also identified.  IMPRESSION: Acute distal radius and ulnar styloid fractures.  Per CMS PQRS reporting requirements (PQRS Measure 24): Given the patient's age of greater than 50 and the fracture site (hip, distal radius, or spine), the patient should be tested for osteoporosis using DXA, and the appropriate treatment considered based on the DXA results.  Original Report Authenticated By: Bernadene Bell. Maricela Curet, M.D.      Dillard Cannon Daisy, [place] 08/07/11 [phone]

## 2011-08-07 NOTE — ED Notes (Signed)
Pt waiting on Ortho consult per Ebbie Ridge PA-C.

## 2011-08-07 NOTE — ED Provider Notes (Signed)
History     CSN: [id number]  Arrival date & time 08/07/11  1747   First MD Initiated Contact with Patient 08/07/11 1827      Chief Complaint  Patient presents with  . Fall    L wrist injury     The history is provided by the patient.   Patient states that she was taking some trash out to her trash cans when she states that the trash can started to tip and she fell backwards, landing on her left wrist. The patient denies pain in any other area. The patient denies headache, weakness, neck pain, N/V, visual changes, chest pain, SOB, or back pain. Past Medical History  Diagnosis Date  . Allergic rhinitis   . Skin cancer   . Hypertension   . OSA (obstructive sleep apnea)   . Irregular heart rate   . Abdominal  pain, other specified site     Past Surgical History  Procedure Date  . Back surgery   . Cholecystectomy   . Total abdominal hysterectomy     Family History  Problem Relation Age of Onset  . Heart disease Father   . Skin cancer Mother   . Skin cancer Father     History  Substance Use Topics  . Smoking status: Never Smoker   . Smokeless tobacco: Not on file  . Alcohol Use: No    OB History    Grav Para Term Preterm Abortions TAB SAB Ect Mult Living                  Review of Systems ROS; All pertinent positives and negatives reviewed in the history of present illness  Allergies  Codeine  Home Medications   Current Outpatient Rx  Name Route Sig Dispense Refill  . ASPIRIN 81 MG PO TABS Oral Take 81 mg by mouth every other day.     Marland Kitchen CALCIUM-VITAMIN D 600-125 MG-UNIT PO TABS Oral Take 1 tablet by mouth daily.    Marland Kitchen VITAMIN D 1000 UNITS PO TABS Oral Take 1,000 Units by mouth daily.    Marland Kitchen COENZYME Q10 60 MG PO TABS Oral Take 1 tablet by mouth daily.    Marland Kitchen FELODIPINE ER 5 MG PO TB24 Oral Take 5 mg by mouth daily.    Marland Kitchen FEXOFENADINE HCL 180 MG PO TABS Oral Take 180 mg by mouth daily.    Marland Kitchen FOLIC ACID 800 MCG PO TABS Oral Take 800 mcg by mouth daily.     Marland Kitchen  LISINOPRIL 20 MG PO TABS Oral Take 20 mg by mouth daily.    Marland Kitchen FISH OIL 1000 MG PO CAPS Oral Take 2 capsules by mouth daily.    . OXYBUTYNIN CHLORIDE 5 MG PO TABS Oral Take 5 mg by mouth daily.    Marland Kitchen PHENYTOIN SODIUM EXTENDED 100 MG PO CAPS Oral Take 200 mg by mouth daily.      BP 190/73  Pulse 76  Temp(Src) 97.4 F (36.3 C) (Oral)  Resp 18  SpO2 99%  Physical Exam  Nursing note and vitals reviewed. Constitutional: She is oriented to person, place, and time. She appears well-developed.  HENT:  Head: Normocephalic and atraumatic.  Eyes: Pupils are equal, round, and reactive to light.  Neck: Normal range of motion.  Cardiovascular: Normal rate, regular rhythm and normal heart sounds.  Exam reveals no gallop and no friction rub.   No murmur heard. Pulmonary/Chest: Effort normal and breath sounds normal. No respiratory distress.  Musculoskeletal:  Arms: Neurological: She is alert and oriented to person, place, and time. She has normal strength. No cranial nerve deficit or sensory deficit. GCS eye subscore is 4. GCS verbal subscore is 5. GCS motor subscore is 6.  Skin: Skin is warm and dry.    ED Course  Procedures (including critical care time)  Labs Reviewed - No data to display Dg Wrist Complete Left  08/07/2011  *RADIOLOGY REPORT*  Clinical Data: Fall.  Wrist pain and swelling.  LEFT WRIST - COMPLETE 3+ VIEW  Comparison: None.  Findings: The patient has a dorsally displaced and angulated fracture of the distal radius.  Ulnar styloid fracture is also identified.  No other acute bony or joint abnormality is seen. Calcifications about the second and third MCP joints is consistent with chronic chondrocalcinosis.  Chondrocalcinosis of the triangular fibrocartilage also identified.  IMPRESSION: Acute distal radius and ulnar styloid fractures.  Per CMS PQRS reporting requirements (PQRS Measure 24): Given the patient's age of greater than 50 and the fracture site (hip, distal radius, or  spine), the patient should be tested for osteoporosis using DXA, and the appropriate treatment considered based on the DXA results.  Original Report Authenticated By: Bernadene Bell. Maricela Curet, M.D.     The patient sees [facility] orthopedics.  I spoke with Dr. Shelle Iron. There is no one on for hand for them. Will splint and Dr. Rubin Payor will reduce the fracture fragment.   MDM  MDM Interpretation: x-ray Consults: orthopedics            Carlyle Dolly, PA-C 08/07/11 2049

## 2011-08-07 NOTE — ED Notes (Signed)
Pt reports she was on a step stool throwing trash in a large trash can, had her weight on the large trash can when it fell.  Pt reports trying to catch her fall with her L hand.  Deformity to L wrist noted.  Pt reports hitting her head, denies LOC.  No hematoma or bleeding noted at this time.  Pt is A&Ox 4.

## 2011-08-07 NOTE — ED Notes (Signed)
Sedation start time 2150.

## 2011-08-07 NOTE — Discharge Instructions (Signed)
Please call Dr Simonne Come first thing tomorrow morning for a close follow up appointment.  You have broken both of the bones in your forearm and one of them is displaced.  Please use the pain medication as prescribed.  Do not take additional tylenol while you are taking this medication.  You may take additional ibuprofen (Motrin, Advil) or aleve if you need additional pain relief.  If you hand turns pale, becomes numb or tingling, or you have difficulty moving your fingers, return immediately to the ER.  You may return to the ER at any time for worsening condition or any new symptoms that concern you.  Cast or Splint Care Casts and splints support injured limbs and keep bones from moving while they heal.  HOME CARE  Keep the cast or splint uncovered during the drying period.   A plaster cast can take 24 to 48 hours to dry.   A fiberglass cast will dry in less than 1 hour.   Do not rest the cast on anything harder than a pillow for 24 hours.   Do not put weight on your injured limb. Do not put pressure on the cast. Wait for your doctor's approval.   Keep the cast or splint dry.   Cover the cast or splint with a plastic bag during baths or wet weather.   If you have a cast over your chest and belly (trunk), take sponge baths until the cast is taken off.   Keep your cast or splint clean. Wash a dirty cast with a damp cloth.   Do not put any objects under your cast or splint. Do not scratch the skin under the cast with an object.   Do not take out the padding from inside your cast.   Exercise your joints near the cast as told by your doctor.   Raise (elevate) your injured limb on 1 or 2 pillows for the first 1 to 3 days.  GET HELP RIGHT AWAY IF:  Your cast or splint cracks.   Your cast or splint is too tight or too loose.   You itch badly under the cast.   Your cast gets wet or has a soft spot.   You have a bad smell coming from the cast.   You get an object stuck under the  cast.   Your skin around the cast becomes red or raw.   You have new or more pain after the cast is put on.   You have fluid leaking through the cast.   You cannot move your fingers or toes.   Your fingers or toes turn colors or are cool, painful, or puffy (swollen).   You have tingling or lose feeling (numbness) around the injured area.   You have pain or pressure under the cast.   You have trouble breathing or have shortness of breath.   You have chest pain.  MAKE SURE YOU:  Understand these instructions.   Will watch your condition.   Will get help right away if you are not doing well or get worse.  Document Released: 08/10/2010 Document Revised: 03/30/2011 Document Reviewed: 08/10/2010 [facility] Patient Information 2012 [place], [place].  Radial Fracture You have a broken bone (fracture) of the forearm. This is the part of your arm between the elbow and your wrist. Your forearm is made up of two bones. These are the radius and ulna. Your fracture is in the radial shaft. This is the bone in your forearm located on the thumb  side. A cast or splint is used to protect and keep your injured bone from moving. The cast or splint will be on generally for about 5 to 6 weeks, with individual variations. HOME CARE INSTRUCTIONS   Keep the injured part elevated while sitting or lying down. Keep the injury above the level of your heart (the center of the chest). This will decrease swelling and pain.   Apply ice to the injury for 15 to 20 minutes, 3 to 4 times per day while awake, for 2 days. Put the ice in a plastic bag and place a towel between the bag of ice and your cast or splint.   Move your fingers to avoid stiffness and minimize swelling.   If you have a plaster or fiberglass cast:   Do not try to scratch the skin under the cast using sharp or pointed objects.   Check the skin around the cast every day. You may put lotion on any red or sore areas.   Keep your cast dry and  clean.   If you have a plaster splint:   Wear the splint as directed.   You may loosen the elastic around the splint if your fingers become numb, tingle, or turn cold or blue.   Do not put pressure on any part of your cast or splint. It may break. Rest your cast only on a pillow for the first 24 hours until it is fully hardened.   Your cast or splint can be protected during bathing with a plastic bag. Do not lower the cast or splint into water.   Only take over-the-counter or prescription medicines for pain, discomfort, or fever as directed by your caregiver.  SEEK IMMEDIATE MEDICAL CARE IF:   Your cast gets damaged or breaks.   You have more severe pain or swelling than you did before getting the cast.   You have severe pain when stretching your fingers.   There is a bad smell, new stains and/or pus-like (purulent) drainage coming from under the cast.   Your fingers or hand turn pale or blue and become cold or your loose feeling.  Document Released: 09/21/2005 Document Revised: 03/30/2011 Document Reviewed: 12/18/2005 [facility] Patient Information 2012 [place], [place].Ulnar Fracture You have a fracture (broken bone) of the forearm. This is the part of your arm between the elbow and your wrist. Your forearm is made up of two bones. These are the radius and ulna. Your fracture is in the ulna. This is the bone in your forearm located on the little finger side of your forearm. A cast or splint is used to protect and keep your injured bone from moving. The cast or splint will be on generally for about 5 to 6 weeks, with individual variations. HOME CARE INSTRUCTIONS   Keep the injured part elevated while sitting or lying down. Keep the injury above the level of your heart (the center of the chest). This will decrease swelling and pain.   Apply ice to the injury for 15 to 20 minutes, 3 to 4 times per day while awake, for 2 days. Put the ice in a plastic bag and place a towel between the bag  of ice and your cast or splint.   Move your fingers to avoid stiffness and minimize swelling.   If you have a plaster or fiberglass cast:   Do not try to scratch the skin under the cast using sharp or pointed objects.   Check the skin around the cast every  day. You may put lotion on any red or sore areas.   Keep your cast dry and clean.   If you have a plaster splint:   Wear the splint as directed.   You may loosen the elastic around the splint if your fingers become numb, tingle, or turn cold or blue.   Do not put pressure on any part of your cast or splint. It may break. Rest your cast only on a pillow the first 24 hours until it is fully hardened.   Your cast or splint can be protected during bathing with a plastic bag. Do not lower the cast or splint into water.   Only take over-the-counter or prescription medicines for pain, discomfort, or fever as directed by your caregiver.  SEEK IMMEDIATE MEDICAL CARE IF:   Your cast gets damaged or breaks.   You have more severe pain or swelling than you did before the cast.   You have severe pain when stretching your fingers.   There is a bad smell or new stains and/or purulent (pus like) drainage coming from under the cast.  Document Released: 09/21/2005 Document Revised: 03/30/2011 Document Reviewed: 02/23/2007 [facility] Patient Information 2012 [place], [place].

## 2011-08-07 NOTE — ED Notes (Signed)
Pt tolerated sedation well. No adverse reactions noted. pts sedation score remained 5. Pt has returned to baseline. VSS. Pt A&Ox's 4. Family at bedside. Pt remains on cardiac monitor.

## 2011-08-08 NOTE — ED Provider Notes (Signed)
  Physical Exam  BP 143/55  Pulse 70  Temp(Src) 97.4 F (36.3 C) (Oral)  Resp 16  Ht 5' 4.96" (1.65 m)  Wt 153 lb 9.6 oz (69.673 kg)  BMI 25.59 kg/m2  SpO2 95%  Physical Exam  ED Course  ORTHOPEDIC INJURY TREATMENT Date/Time: 08/08/2011 12:10 AM Performed by: Benjiman Core R. Authorized by: Billee Cashing Consent: Verbal consent obtained. Written consent obtained. Risks and benefits: risks, benefits and alternatives were discussed Consent given by: patient Patient understanding: patient states understanding of the procedure being performed Patient consent: the patient's understanding of the procedure matches consent given Procedure consent: procedure consent matches procedure scheduled Relevant documents: relevant documents present and verified Test results: test results available and properly labeled Required items: required blood products, implants, devices, and special equipment available Patient identity confirmed: verbally with patient and arm band Time out: Immediately prior to procedure a "time out" was called to verify the correct patient, procedure, equipment, support staff and site/side marked as required. Injury location: wrist Location details: left wrist Injury type: fracture Fracture type: distal radius and ulnar styloid Pre-procedure neurovascular assessment: neurovascularly intact Pre-procedure distal perfusion: normal Pre-procedure neurological function: normal Pre-procedure range of motion: reduced Local anesthesia used: yes Anesthesia: hematoma block Local anesthetic: bupivacaine 0.5% without epinephrine Anesthetic total: 2 ml Patient sedated: yes Manipulation performed: yes Reduction successful: no Immobilization: splint Splint type: sugar tong Post-procedure neurovascular assessment: post-procedure neurovascularly intact Post-procedure distal perfusion: normal Post-procedure neurological function: normal Post-procedure range of motion:  normal Patient tolerance: Patient tolerated the procedure well with no immediate complications. Comments: Will followup with orthopedics tomorrow.    Procedural sedation Performed by: Billee Cashing Consent: Verbal consent obtained. Risks and benefits: risks, benefits and alternatives were discussed Required items: required blood products, implants, devices, and special equipment available Patient identity confirmed: arm band and provided demographic data Time out: Immediately prior to procedure a "time out" was called to verify the correct patient, procedure, equipment, support staff and site/side marked as required.  Sedation type: moderate (conscious) sedation NPO time confirmed and considedered  Sedatives: PROPOFOL  Physician Time at Bedside:  Vitals: Vital signs were monitored during sedation. Cardiac Monitor, pulse oximeter Patient tolerance: Patient tolerated the procedure well with no immediate complications. Comments: Pt with uneventful recovered. Returned to pre-procedural sedation baseline    MDM Patient with a fall and distal radius and ulnar styloid fracture. Discussed with orthopedic surgery. He will see her in followup. Unsuccessful reduction an attempt, but somewhat improved pain here. No neuro deficits. She'll followup with orthopedic tomorrow. She sees [facility] like to followup with him. He did not have a hand surgeon on call today, but the patient wished to stay with them and not see the other doctor on call      Juliet Rude. Rubin Payor, MD 08/08/11 [phone]

## 2011-08-10 NOTE — ED Provider Notes (Signed)
Medical screening examination/treatment/procedure(s) were conducted as a shared visit with non-physician practitioner(s) and myself.  I personally evaluated the patient during the encounter  Juliet Rude. Rubin Payor, MD 08/10/11 [phone]

## 2011-08-10 NOTE — ED Provider Notes (Signed)
Medical screening examination/treatment/procedure(s) were conducted as a shared visit with non-physician practitioner(s) and myself.  I personally evaluated the patient during the encounter  Torrance Frech R. Teagan Ozawa, MD 08/10/11 0703 

## 2011-09-11 ENCOUNTER — Ambulatory Visit: Payer: Medicare Other | Attending: Orthopedic Surgery | Admitting: Physical Therapy

## 2011-09-11 DIAGNOSIS — IMO0001 Reserved for inherently not codable concepts without codable children: Secondary | ICD-10-CM | POA: Insufficient documentation

## 2011-09-11 DIAGNOSIS — M25639 Stiffness of unspecified wrist, not elsewhere classified: Secondary | ICD-10-CM | POA: Insufficient documentation

## 2011-09-11 DIAGNOSIS — M25539 Pain in unspecified wrist: Secondary | ICD-10-CM | POA: Insufficient documentation

## 2011-09-14 ENCOUNTER — Ambulatory Visit: Payer: Medicare Other | Admitting: Physical Therapy

## 2011-09-19 ENCOUNTER — Ambulatory Visit: Payer: Medicare Other | Admitting: Physical Therapy

## 2011-09-21 ENCOUNTER — Ambulatory Visit: Payer: Medicare Other | Admitting: Physical Therapy

## 2011-09-25 ENCOUNTER — Ambulatory Visit: Payer: Medicare Other | Attending: Orthopedic Surgery | Admitting: Physical Therapy

## 2011-09-25 DIAGNOSIS — IMO0001 Reserved for inherently not codable concepts without codable children: Secondary | ICD-10-CM | POA: Insufficient documentation

## 2011-09-25 DIAGNOSIS — M25539 Pain in unspecified wrist: Secondary | ICD-10-CM | POA: Insufficient documentation

## 2011-09-25 DIAGNOSIS — M25639 Stiffness of unspecified wrist, not elsewhere classified: Secondary | ICD-10-CM | POA: Insufficient documentation

## 2011-09-28 ENCOUNTER — Ambulatory Visit: Payer: Medicare Other | Admitting: Physical Therapy

## 2011-10-02 ENCOUNTER — Ambulatory Visit: Payer: Medicare Other | Admitting: Physical Therapy

## 2011-10-05 ENCOUNTER — Ambulatory Visit: Payer: Medicare Other | Admitting: Physical Therapy

## 2011-10-10 ENCOUNTER — Ambulatory Visit: Payer: Medicare Other | Admitting: Physical Therapy

## 2011-10-12 ENCOUNTER — Ambulatory Visit: Payer: Medicare Other | Admitting: Physical Therapy

## 2011-10-17 ENCOUNTER — Ambulatory Visit: Payer: Medicare Other | Admitting: Physical Therapy

## 2011-10-19 ENCOUNTER — Ambulatory Visit: Payer: Medicare Other | Admitting: Physical Therapy

## 2011-10-23 ENCOUNTER — Ambulatory Visit: Payer: Medicare Other | Attending: Orthopedic Surgery | Admitting: Physical Therapy

## 2011-10-23 DIAGNOSIS — M25539 Pain in unspecified wrist: Secondary | ICD-10-CM | POA: Insufficient documentation

## 2011-10-23 DIAGNOSIS — M25639 Stiffness of unspecified wrist, not elsewhere classified: Secondary | ICD-10-CM | POA: Insufficient documentation

## 2011-10-23 DIAGNOSIS — IMO0001 Reserved for inherently not codable concepts without codable children: Secondary | ICD-10-CM | POA: Insufficient documentation

## 2011-10-24 ENCOUNTER — Ambulatory Visit: Payer: Medicare Other | Admitting: Physical Therapy

## 2011-10-30 ENCOUNTER — Ambulatory Visit: Payer: Medicare Other | Admitting: Physical Therapy

## 2012-06-04 ENCOUNTER — Ambulatory Visit (INDEPENDENT_AMBULATORY_CARE_PROVIDER_SITE_OTHER): Payer: Medicare Other | Admitting: Pulmonary Disease

## 2012-06-04 ENCOUNTER — Ambulatory Visit: Payer: Medicare Other | Admitting: Pulmonary Disease

## 2012-06-04 ENCOUNTER — Encounter: Payer: Self-pay | Admitting: Pulmonary Disease

## 2012-06-04 VITALS — BP 148/78 | HR 82 | Temp 96.8°F | Ht 63.0 in | Wt 147.4 lb

## 2012-06-04 DIAGNOSIS — G4733 Obstructive sleep apnea (adult) (pediatric): Secondary | ICD-10-CM

## 2012-06-04 NOTE — Assessment & Plan Note (Signed)
The patient is doing very well on adaptSV.  She is sleeping well, feels rested in the mornings, and is having no issues with her device.  I have asked her to keep up with her mask changes and supplies, and to work on weight loss.

## 2012-06-04 NOTE — Progress Notes (Signed)
  Subjective:    Patient ID: [name], female    DOB: 1930-04-12, 79 y.o.   MRN: [id number]  HPI The patient comes in today for followup of her known obstructive sleep apnea.  She is wearing Adapt SV and feels that she is doing well with the device.  She is having no issues with her mask fit or pressure.  She feels that she is sleeping well, and denies any daytime alertness issues.   Review of Systems  Constitutional: Negative for fever and unexpected weight change.  HENT: Negative for ear pain, nosebleeds, congestion, sore throat, rhinorrhea, sneezing, trouble swallowing, dental problem, postnasal drip and sinus pressure.   Eyes: Negative for redness and itching.  Respiratory: Negative for cough, chest tightness, shortness of breath and wheezing.   Cardiovascular: Negative for palpitations and leg swelling.  Gastrointestinal: Negative for nausea and vomiting.  Genitourinary: Negative for dysuria.  Musculoskeletal: Negative for joint swelling.  Skin: Negative for rash.  Neurological: Negative for headaches.  Hematological: Does not bruise/bleed easily.  Psychiatric/Behavioral: Negative for dysphoric mood. The patient is not nervous/anxious.        Objective:   Physical Exam Overweight female in no acute distress Nose without purulence or discharge noted No skin breakdown or pressure necrosis from the CPAP mask Lower extremities with minimal edema, no cyanosis Alert and oriented, does not appear to be sleepy, moves all 4 extremities.       Assessment & Plan:

## 2012-06-04 NOTE — Patient Instructions (Addendum)
Continue with your device, and keep up with mask changes and supplies. Work on weight loss followup with me in one year.

## 2012-09-28 DIAGNOSIS — M109 Gout, unspecified: Secondary | ICD-10-CM | POA: Insufficient documentation

## 2013-02-10 DIAGNOSIS — M810 Age-related osteoporosis without current pathological fracture: Secondary | ICD-10-CM | POA: Insufficient documentation

## 2013-05-15 ENCOUNTER — Other Ambulatory Visit: Payer: Self-pay | Admitting: Dermatology

## 2013-06-04 ENCOUNTER — Encounter: Payer: Self-pay | Admitting: Pulmonary Disease

## 2013-06-04 ENCOUNTER — Ambulatory Visit (INDEPENDENT_AMBULATORY_CARE_PROVIDER_SITE_OTHER): Payer: Medicare Other | Admitting: Pulmonary Disease

## 2013-06-04 VITALS — BP 116/72 | HR 70 | Temp 97.5°F | Ht 60.0 in | Wt 149.2 lb

## 2013-06-04 DIAGNOSIS — G4733 Obstructive sleep apnea (adult) (pediatric): Secondary | ICD-10-CM

## 2013-06-04 NOTE — Progress Notes (Signed)
   Subjective:    Patient ID: [name], female    DOB: 05/01/1929, 80 y.o.   MRN: [id number]  HPI Patient comes in today for followup of her known obstructive sleep apnea. 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  She is sleeping well with the device, and feels rested in the mornings upon arising. She denies any inappropriate daytime sleepiness. She is having no issues with her mask fit, and has been keeping up with her mask changes and supplies.   Review of Systems  Constitutional: Negative for fever and unexpected weight change.  HENT: Negative for congestion, dental problem, ear pain, nosebleeds, postnasal drip, rhinorrhea, sinus pressure, sneezing, sore throat and trouble swallowing.   Eyes: Negative for redness and itching.  Respiratory: Negative for cough, chest tightness, shortness of breath and wheezing.   Cardiovascular: Positive for leg swelling ( right ankle swelling). Negative for palpitations.  Gastrointestinal: Negative for nausea and vomiting.  Genitourinary: Negative for dysuria.  Musculoskeletal: Negative for joint swelling.  Skin: Negative for rash.  Neurological: Negative for headaches.  Hematological: Does not bruise/bleed easily.  Psychiatric/Behavioral: Negative for dysphoric mood. The patient is not nervous/anxious.        Objective:   Physical Exam Ow female in nad Nose without purulence or d/c noted.  No skin breakdown or pressure necrosis from cpap mask. Neck without LN or TMG LE with mild edema, no cyanosis Alert and oriented, does not appear to be sleepy, moves all 4.        Assessment & Plan:

## 2013-06-04 NOTE — Assessment & Plan Note (Signed)
The pt feels she is doing well on her adapt sv, and is sleeping well with adequate daytime alertness.  I have asked her to continue with her treatment, and to work on weight loss.  I will see her back in one year.

## 2013-06-04 NOTE — Patient Instructions (Signed)
Continue on your ASV device, and keep up with mask changes and supplies. Work on weight reduction. followup with me again in one year.

## 2013-08-12 DIAGNOSIS — Z6829 Body mass index (BMI) 29.0-29.9, adult: Secondary | ICD-10-CM | POA: Insufficient documentation

## 2013-09-22 ENCOUNTER — Encounter: Payer: Self-pay | Admitting: Podiatry

## 2013-09-22 ENCOUNTER — Ambulatory Visit (INDEPENDENT_AMBULATORY_CARE_PROVIDER_SITE_OTHER): Payer: Medicare Other | Admitting: Podiatry

## 2013-09-22 VITALS — BP 158/70 | HR 63 | Resp 18

## 2013-09-22 DIAGNOSIS — M2040 Other hammer toe(s) (acquired), unspecified foot: Secondary | ICD-10-CM

## 2013-09-22 DIAGNOSIS — Q828 Other specified congenital malformations of skin: Secondary | ICD-10-CM

## 2013-09-22 NOTE — Progress Notes (Signed)
° °  Subjective:    Patient ID: [name], female    DOB: 1929-05-28, 80 y.o.   MRN: [id number]  HPI I have some places on both of my feet and the right 5th toe and has been going on for about 6 months and hurts with shoes and sore and tender and feels like it is cutting me with a knife and on the left foot I have a place on the side of my foot and has been going on for about 6 months and sore and tender as well and hurts with shoes    Review of Systems  Skin:       Left leg has some redness to it  Neurological: Positive for dizziness.  Hematological: Bruises/bleeds easily.  All other systems reviewed and are negative.      Objective:   Physical Exam  Orientated x3 white female  Vascular: DP pulses 4/4 bilaterally PT pulses 0/4 bilaterally  Neurological: Sensation to 10 g monofilament wire intact 4/5 right and 5/5 left Ankle reflexes reactive bilaterally Vibratory sensation and reactive bilaterally  Dermatological: Keratoses dorsum of right fifth toe Nucleated keratoses plantar fifth left MPJ  Musculoskeletal: Adductovarus rotation fourth and fifth right toes      Assessment & Plan:   Assessment: Suspect peripheral vascular disease associated with decreased pedal pulses Sensory neuropathy Hammertoe deformity fifth right toe Keratoses fifth right toe Porokeratoses left  Plan: Keratoses x2 debrided Gelcap sleeve dispensed to wear over fifth right toe  Reappoint at patient's request

## 2013-09-22 NOTE — Patient Instructions (Signed)
Wear gelcap sleeve on fifth right toe Spot stretch shoe over fifth right toe Consider cutting shoe out over fifth right toe

## 2013-09-23 ENCOUNTER — Encounter: Payer: Self-pay | Admitting: Podiatry

## 2014-06-10 ENCOUNTER — Encounter: Payer: Self-pay | Admitting: Pulmonary Disease

## 2014-06-10 ENCOUNTER — Ambulatory Visit (INDEPENDENT_AMBULATORY_CARE_PROVIDER_SITE_OTHER): Payer: Medicare Other | Admitting: Pulmonary Disease

## 2014-06-10 VITALS — BP 110/78 | HR 73 | Temp 97.0°F | Ht 62.0 in | Wt 150.0 lb

## 2014-06-10 DIAGNOSIS — G4737 Central sleep apnea in conditions classified elsewhere: Secondary | ICD-10-CM

## 2014-06-10 DIAGNOSIS — G4731 Primary central sleep apnea: Secondary | ICD-10-CM

## 2014-06-10 DIAGNOSIS — G4733 Obstructive sleep apnea (adult) (pediatric): Secondary | ICD-10-CM

## 2014-06-10 NOTE — Assessment & Plan Note (Signed)
The patient appears to be doing well her current ASV set up. I've asked her to continue on her device, and to keep up with mask changes and supplies. I've also encouraged her to work on modest weight loss, and to call us if she is not doing well.

## 2014-06-10 NOTE — Patient Instructions (Signed)
Continue on your ASV device, and keep up with mask changes and supplies. Work on modest weight loss followup with me again in one year, but call if having any issues with your device.

## 2014-06-10 NOTE — Progress Notes (Signed)
   Subjective:    Patient ID: [name], female    DOB: Apr 15, 1930, 81 y.o.   MRN: [id number]  HPI The patient comes in today for follow-up of her complex sleep apnea. She is maintaining on her ASV device, and feels that she is doing well. Her husband has not heard any breakthrough snoring and thinks that she sleeps well. Has any significant daytime alertness issues, and is satisfied with her sleep.   Review of Systems  Constitutional: Negative.  Negative for fever and unexpected weight change.  HENT: Positive for rhinorrhea. Negative for congestion, dental problem, ear pain, nosebleeds, postnasal drip, sinus pressure, sneezing, sore throat and trouble swallowing.   Eyes: Negative.  Negative for redness and itching.  Respiratory: Positive for cough. Negative for choking, chest tightness, shortness of breath, wheezing and stridor.   Cardiovascular: Negative for palpitations and leg swelling.  Gastrointestinal: Negative.  Negative for nausea and vomiting.  Endocrine: Negative.   Genitourinary: Negative.  Negative for dysuria and decreased urine volume.  Musculoskeletal: Negative.  Negative for joint swelling.  Skin: Negative for rash.  Allergic/Immunologic: Positive for environmental allergies. Negative for food allergies and immunocompromised state.  Neurological: Negative for seizures, syncope, weakness, numbness and headaches.  Hematological: Does not bruise/bleed easily.  Psychiatric/Behavioral: Negative.  Negative for dysphoric mood. The patient is not nervous/anxious.        Objective:   Physical Exam Overweight female in no acute distress Nose without purulence or discharge noted Neck without lymphadenopathy or thyromegaly No skin breakdown or pressure necrosis from the C Pap mask Lower extremities with mild edema, no cyanosis Alert and oriented, does not appear to be sleepy, moves all 4 extremities.       Assessment & Plan:

## 2014-09-15 DIAGNOSIS — K529 Noninfective gastroenteritis and colitis, unspecified: Secondary | ICD-10-CM | POA: Insufficient documentation

## 2015-06-14 ENCOUNTER — Ambulatory Visit: Payer: Medicare Other | Admitting: Pulmonary Disease

## 2015-06-28 ENCOUNTER — Ambulatory Visit: Payer: Medicare Other | Admitting: Pulmonary Disease

## 2015-10-01 ENCOUNTER — Observation Stay (HOSPITAL_COMMUNITY)
Admission: EM | Admit: 2015-10-01 | Discharge: 2015-10-03 | Disposition: A | Discharge status: Home / Self Care | Payer: Medicare Other | Attending: Internal Medicine | Admitting: Internal Medicine

## 2015-10-01 ENCOUNTER — Emergency Department (HOSPITAL_COMMUNITY): Payer: Medicare Other

## 2015-10-01 ENCOUNTER — Encounter (HOSPITAL_COMMUNITY): Payer: Self-pay | Admitting: Emergency Medicine

## 2015-10-01 DIAGNOSIS — G40909 Epilepsy, unspecified, not intractable, without status epilepticus: Secondary | ICD-10-CM

## 2015-10-01 DIAGNOSIS — Z7982 Long term (current) use of aspirin: Secondary | ICD-10-CM | POA: Insufficient documentation

## 2015-10-01 DIAGNOSIS — E871 Hypo-osmolality and hyponatremia: Secondary | ICD-10-CM | POA: Diagnosis not present

## 2015-10-01 DIAGNOSIS — J019 Acute sinusitis, unspecified: Secondary | ICD-10-CM | POA: Diagnosis not present

## 2015-10-01 DIAGNOSIS — Z9989 Dependence on other enabling machines and devices: Secondary | ICD-10-CM | POA: Insufficient documentation

## 2015-10-01 DIAGNOSIS — Z79899 Other long term (current) drug therapy: Secondary | ICD-10-CM | POA: Insufficient documentation

## 2015-10-01 DIAGNOSIS — R079 Chest pain, unspecified: Principal | ICD-10-CM | POA: Diagnosis present

## 2015-10-01 DIAGNOSIS — I10 Essential (primary) hypertension: Secondary | ICD-10-CM | POA: Diagnosis present

## 2015-10-01 DIAGNOSIS — G4733 Obstructive sleep apnea (adult) (pediatric): Secondary | ICD-10-CM | POA: Diagnosis present

## 2015-10-01 DIAGNOSIS — G4737 Central sleep apnea in conditions classified elsewhere: Secondary | ICD-10-CM

## 2015-10-01 DIAGNOSIS — B9689 Other specified bacterial agents as the cause of diseases classified elsewhere: Secondary | ICD-10-CM | POA: Diagnosis present

## 2015-10-01 DIAGNOSIS — G4739 Other sleep apnea: Secondary | ICD-10-CM | POA: Diagnosis present

## 2015-10-01 DIAGNOSIS — G4731 Primary central sleep apnea: Secondary | ICD-10-CM | POA: Diagnosis present

## 2015-10-01 LAB — CBC
HCT: 36.4 % (ref 36.0–46.0)
HEMOGLOBIN: 12.3 g/dL (ref 12.0–15.0)
MCH: 31.1 pg (ref 26.0–34.0)
MCHC: 33.8 g/dL (ref 30.0–36.0)
MCV: 91.9 fL (ref 78.0–100.0)
PLATELETS: 196 10*3/uL (ref 150–400)
RBC: 3.96 MIL/uL (ref 3.87–5.11)
RDW: 12.8 % (ref 11.5–15.5)
WBC: 7 10*3/uL (ref 4.0–10.5)

## 2015-10-01 LAB — BASIC METABOLIC PANEL
Anion gap: 9 (ref 5–15)
BUN: 30 mg/dL — AB (ref 6–20)
CALCIUM: 9.9 mg/dL (ref 8.9–10.3)
CO2: 21 mmol/L — AB (ref 22–32)
CREATININE: 0.91 mg/dL (ref 0.44–1.00)
Chloride: 104 mmol/L (ref 101–111)
GFR calc Af Amer: >60 mL/min (ref >60)
GFR calc non Af Amer: 57 mL/min — ABNORMAL LOW (ref >60)
GLUCOSE: 127 mg/dL — AB (ref 65–99)
Potassium: 4.5 mmol/L (ref 3.5–5.1)
Sodium: 134 mmol/L — ABNORMAL LOW (ref 135–145)

## 2015-10-01 LAB — PHENYTOIN LEVEL, TOTAL: Phenytoin Lvl: <2.5 ug/mL — ABNORMAL LOW (ref 10.0–20.0)

## 2015-10-01 LAB — I-STAT TROPONIN, ED: TROPONIN I, POC: 0 ng/mL (ref 0.00–0.08)

## 2015-10-01 LAB — TROPONIN I: Troponin I: <0.03 ng/mL (ref <0.031)

## 2015-10-01 MED ORDER — OMEGA-3-ACID ETHYL ESTERS 1 G PO CAPS
1.0000 g | ORAL_CAPSULE | Freq: Every day | ORAL | Status: DC
  Administered 2015-10-02: 1 g via ORAL
  Filled 2015-10-01 (×2): qty 1

## 2015-10-01 MED ORDER — HEPARIN SODIUM (PORCINE) 5000 UNIT/ML IJ SOLN
5000.0000 [IU] | Freq: Three times a day (TID) | INTRAMUSCULAR | Status: DC
  Administered 2015-10-02 – 2015-10-03 (×5): 5000 [IU] via SUBCUTANEOUS
  Filled 2015-10-01 (×5): qty 1

## 2015-10-01 MED ORDER — PHENYTOIN SODIUM EXTENDED 100 MG PO CAPS
100.0000 mg | ORAL_CAPSULE | Freq: Two times a day (BID) | ORAL | Status: DC
Start: 2015-10-01 — End: 2015-10-03
  Administered 2015-10-02 (×2): 100 mg via ORAL
  Filled 2015-10-01 (×5): qty 1

## 2015-10-01 MED ORDER — LISINOPRIL 20 MG PO TABS
20.0000 mg | ORAL_TABLET | Freq: Every day | ORAL | Status: DC
  Administered 2015-10-02: 20 mg via ORAL
  Filled 2015-10-01 (×2): qty 1

## 2015-10-01 MED ORDER — PANTOPRAZOLE SODIUM 40 MG PO TBEC
40.0000 mg | DELAYED_RELEASE_TABLET | Freq: Every day | ORAL | Status: DC
  Administered 2015-10-02 (×2): 40 mg via ORAL
  Filled 2015-10-01 (×3): qty 1

## 2015-10-01 MED ORDER — ACETAMINOPHEN 325 MG PO TABS
650.0000 mg | ORAL_TABLET | Freq: Four times a day (QID) | ORAL | Status: DC | PRN
  Administered 2015-10-01: 650 mg via ORAL
  Filled 2015-10-01: qty 2

## 2015-10-01 MED ORDER — FLUTICASONE PROPIONATE 50 MCG/ACT NA SUSP
2.0000 | Freq: Every day | NASAL | Status: DC
  Administered 2015-10-02: 2 via NASAL
  Filled 2015-10-01: qty 16

## 2015-10-01 MED ORDER — FOLIC ACID 1 MG PO TABS
1.0000 mg | ORAL_TABLET | Freq: Every day | ORAL | Status: DC
  Administered 2015-10-02: 1 mg via ORAL
  Filled 2015-10-01 (×2): qty 1

## 2015-10-01 MED ORDER — SALINE SPRAY 0.65 % NA SOLN
1.0000 | NASAL | Status: DC | PRN
  Filled 2015-10-01: qty 44

## 2015-10-01 MED ORDER — ASPIRIN EC 81 MG PO TBEC
81.0000 mg | DELAYED_RELEASE_TABLET | ORAL | Status: DC
  Administered 2015-10-02: 81 mg via ORAL
  Filled 2015-10-01: qty 1

## 2015-10-01 MED ORDER — AMOXICILLIN-POT CLAVULANATE 875-125 MG PO TABS
1.0000 | ORAL_TABLET | Freq: Two times a day (BID) | ORAL | Status: DC
  Administered 2015-10-02 (×3): 1 via ORAL
  Filled 2015-10-01 (×4): qty 1

## 2015-10-01 MED ORDER — CALCIUM CARBONATE-VITAMIN D 500-200 MG-UNIT PO TABS
1.0000 | ORAL_TABLET | Freq: Every day | ORAL | Status: DC
Start: 2015-10-02 — End: 2015-10-03
  Administered 2015-10-02: 1 via ORAL
  Filled 2015-10-01 (×2): qty 1

## 2015-10-01 MED ORDER — LORATADINE 10 MG PO TABS
10.0000 mg | ORAL_TABLET | Freq: Every day | ORAL | Status: DC
  Administered 2015-10-02: 10 mg via ORAL
  Filled 2015-10-01 (×2): qty 1

## 2015-10-01 MED ORDER — METOPROLOL TARTRATE 5 MG/5ML IV SOLN
5.0000 mg | Freq: Once | INTRAVENOUS | Status: AC
  Administered 2015-10-02: 5 mg via INTRAVENOUS
  Filled 2015-10-01: qty 5

## 2015-10-01 MED ORDER — DIPHENOXYLATE-ATROPINE 2.5-0.025 MG PO TABS: 1.0000 | ORAL_TABLET | ORAL | Status: DC | PRN

## 2015-10-01 MED ORDER — OXYBUTYNIN CHLORIDE 5 MG PO TABS
5.0000 mg | ORAL_TABLET | Freq: Every day | ORAL | Status: DC
  Administered 2015-10-02: 5 mg via ORAL
  Filled 2015-10-01 (×2): qty 1

## 2015-10-01 MED ORDER — ASPIRIN 81 MG PO CHEW
325.0000 mg | CHEWABLE_TABLET | Freq: Once | ORAL | Status: AC
  Administered 2015-10-01: 324 mg via ORAL
  Filled 2015-10-01: qty 5

## 2015-10-01 MED ORDER — ATORVASTATIN CALCIUM 40 MG PO TABS
40.0000 mg | ORAL_TABLET | Freq: Once | ORAL | Status: AC
  Administered 2015-10-02: 40 mg via ORAL
  Filled 2015-10-01: qty 1

## 2015-10-01 MED ORDER — NITROGLYCERIN 0.4 MG SL SUBL
0.4000 mg | SUBLINGUAL_TABLET | SUBLINGUAL | Status: DC | PRN
  Administered 2015-10-01 (×2): 0.4 mg via SUBLINGUAL
  Filled 2015-10-01: qty 1

## 2015-10-01 MED ORDER — ONDANSETRON HCL 4 MG/2ML IJ SOLN: 4.0000 mg | Freq: Four times a day (QID) | INTRAMUSCULAR | Status: DC | PRN

## 2015-10-01 MED ORDER — NIFEDIPINE ER 30 MG PO TB24
30.0000 mg | ORAL_TABLET | Freq: Every day | ORAL | Status: DC
  Administered 2015-10-02: 30 mg via ORAL
  Filled 2015-10-01 (×2): qty 1

## 2015-10-01 NOTE — H&P (Signed)
History and Physical    [name] [id number] DOB: [birth year] DOA: 10/01/2015  PCP: Phineas Inches, MD   Patient coming from: Home   Chief Complaint: Chest tightness   HPI: [name] is a 82 y.o. female with medical history significant for allergic rhinitis, hypertension, sleep apnea, and recent diagnosis of acute bacterial sinusitis on doxycycline and presents to the ED with 3 days of intermittent pressure sensation in the central chest. Patient reports pain in her usual state of health until approximately 3 weeks ago when she developed copious rhinorrhea. Over the ensuing days, the rhinorrhea slowed, but she experienced sinus congestion with pain at the maxillary sinus. This continued to progress despite aggressive treatment of her allergic rhinitis and she was prescribed a course of doxycycline on 09/27/2015. The following day, patient noted the acute development of pressure in the central chest while at rest that resolved spontaneously over the course of several minutes. Sensation recurred several times over the next 2 days but has been persistent today. She is unable to identify any alleviating or exacerbating factors. She describes the sensation as a "tightness". It is nonradiating and without associated nausea, shortness of breath, or diaphoresis. There's been no palpitations, unilateral leg swelling or tenderness, or history of VTE. She reports similar symptoms approximately 20 years ago which was initially diagnosed as angina but she reports that she was later told after some testing that it was not cardiac related and she had not experienced those symptoms again in the past 20 years.  ED Course: Upon arrival to the ED, patient is found to be afebrile, saturating well on room air, and with vital signs stable. EKG features a sinus rhythm with ventricular premature complex and troponin is 0.00. Chest x-ray is negative for acute cardiopulmonary disease. Chemistry panel features a  mild hyponatremia and BUN of 30 with serum creatinine of 0.91. CBC is unremarkable. A full dose aspirin chew was administered in the emergency department and the patient was monitored on telemetry with no ischemic changes appreciated. She denies chest pain at this time and will be observed on the telemetry unit for ongoing evaluation and management of her presenting complaints.  Review of Systems:  All other systems reviewed and apart from HPI, are negative.  Past Medical History  Diagnosis Date  . Allergic rhinitis   . Skin cancer   . Hypertension   . OSA (obstructive sleep apnea)   . Irregular heart rate   . Abdominal pain, other specified site     Past Surgical History  Procedure Laterality Date  . Back surgery    . Cholecystectomy    . Total abdominal hysterectomy       reports that she has never smoked. She has never used smokeless tobacco. She reports that she does not drink alcohol or use illicit drugs.  Allergies  Allergen Reactions  . Codeine Other (See Comments)    Family History  Problem Relation Age of Onset  . Heart disease Father   . Skin cancer Mother   . Skin cancer Father      Prior to Admission medications   Medication Sig Start Date End Date Taking? Authorizing Provider  aspirin 81 MG tablet Take 81 mg by mouth every other day.    Yes Historical Provider, MD  Calcium-Vitamin D 600-125 MG-UNIT TABS Take 1 tablet by mouth daily.   Yes Historical Provider, MD  cholecalciferol (VITAMIN D) 1000 UNITS tablet Take 1,000 Units by mouth daily.   Yes Historical Provider,  MD  Coenzyme Q10 60 MG TABS Take 1 tablet by mouth daily.   Yes Historical Provider, MD  doxycycline (VIBRAMYCIN) 100 MG capsule Take 100 mg by mouth 2 (two) times daily.  09/27/15 10/07/15 Yes Historical Provider, MD  felodipine (PLENDIL) 5 MG 24 hr tablet Take 5 mg by mouth daily.   Yes Historical Provider, MD  fexofenadine (ALLEGRA) 180 MG tablet Take 180 mg by mouth daily.   Yes Historical  Provider, MD  folic acid (FOLVITE) 1 MG tablet Take 1 mg by mouth daily.   Yes Historical Provider, MD  lisinopril (PRINIVIL,ZESTRIL) 20 MG tablet Take 20 mg by mouth daily.   Yes Historical Provider, MD  NIFEdipine (PROCARDIA-XL/ADALAT-CC/NIFEDICAL-XL) 30 MG 24 hr tablet TK 1 T PO  D 08/13/15  Yes Historical Provider, MD  Omega-3 Fatty Acids (FISH OIL) 1000 MG CAPS Take 2 capsules by mouth daily.   Yes Historical Provider, MD  oxybutynin (DITROPAN) 5 MG tablet Take 5 mg by mouth daily.   Yes Historical Provider, MD  phenytoin (DILANTIN) 100 MG ER capsule Take 100 mg by mouth 2 (two) times daily.    Yes Historical Provider, MD  diphenoxylate-atropine (LOMOTIL) 2.5-0.025 MG per tablet Take 1 tablet by mouth as needed for diarrhea or loose stools.    Historical Provider, MD  sodium chloride (OCEAN) 0.65 % SOLN nasal spray Place 1 spray into both nostrils as needed for congestion.    Historical Provider, MD    Physical Exam: Filed Vitals:   10/01/15 2147 10/01/15 2155 10/01/15 2200 10/01/15 2230  BP: 156/73 158/63 150/60 156/71  Pulse: 87 89 86   Temp:      TempSrc:      Resp: 18 18 22 20   Height:      Weight:      SpO2: 98% 97% 98%       Constitutional: NAD, calm, in apparent discomfort  Eyes: PERTLA, lids and conjunctivae normal ENMT: Mucous membranes are moist. Cobblestoning of posterior pharynx, no exudate or lesions.   Neck: normal, supple, no masses, no thyromegaly Respiratory: clear to auscultation bilaterally, no wheezing, no crackles. Normal respiratory effort.    Cardiovascular: S1 & S2 heard, regular rate and rhythm, no significant murmurs / rubs / gallops. No significant JVD. Abdomen: No distension, no tenderness, no masses palpated. Bowel sounds normal.  Musculoskeletal: no clubbing / cyanosis. No joint deformity upper and lower extremities. Normal muscle tone.  Skin: no significant rashes, lesions, ulcers. Warm, dry, well-perfused. Neurologic: CN 2-12 grossly intact.  Sensation intact, DTR normal. Strength 5/5 in all 4 limbs. Fine resting tremor, generalized.  Psychiatric: Normal judgment and insight. Alert and oriented x 3. Normal mood and affect.      Labs on Admission: I have personally reviewed following labs and imaging studies  CBC:  Recent Labs Lab 10/01/15 1933  WBC 7.0  HGB 12.3  HCT 36.4  MCV 91.9  PLT 123456   Basic Metabolic Panel:  Recent Labs Lab 10/01/15 1933  NA 134*  K 4.5  CL 104  CO2 21*  GLUCOSE 127*  BUN 30*  CREATININE 0.91  CALCIUM 9.9   GFR: Estimated Creatinine Clearance: 43.8 mL/min (by C-G formula based on Cr of 0.91). Liver Function Tests: No results for input(s): AST, ALT, ALKPHOS, BILITOT, PROT, ALBUMIN in the last 168 hours. No results for input(s): LIPASE, AMYLASE in the last 168 hours. No results for input(s): AMMONIA in the last 168 hours. Coagulation Profile: No results for input(s): INR, PROTIME in the  last 168 hours. Cardiac Enzymes: No results for input(s): CKTOTAL, CKMB, CKMBINDEX, TROPONINI in the last 168 hours. BNP (last 3 results) No results for input(s): PROBNP in the last 8760 hours. HbA1C: No results for input(s): HGBA1C in the last 72 hours. CBG: No results for input(s): GLUCAP in the last 168 hours. Lipid Profile: No results for input(s): CHOL, HDL, LDLCALC, TRIG, CHOLHDL, LDLDIRECT in the last 72 hours. Thyroid Function Tests: No results for input(s): TSH, T4TOTAL, FREET4, T3FREE, THYROIDAB in the last 72 hours. Anemia Panel: No results for input(s): VITAMINB12, FOLATE, FERRITIN, TIBC, IRON, RETICCTPCT in the last 72 hours. Urine analysis: No results found for: COLORURINE, APPEARANCEUR, LABSPEC, PHURINE, GLUCOSEU, HGBUR, BILIRUBINUR, KETONESUR, PROTEINUR, UROBILINOGEN, NITRITE, LEUKOCYTESUR Sepsis Labs: @LABRCNTIP (procalcitonin:4,lacticidven:4) )No results found for this or any previous visit (from the past 240 hour(s)).   Radiological Exams on Admission: Dg Chest 2  View  10/01/2015  CLINICAL DATA:  Back pain, shortness of breath, dry cough EXAM: CHEST  2 VIEW COMPARISON:  02/20/2009 FINDINGS: Cardiomediastinal silhouette is stable. No acute infiltrate or pleural effusion. No pulmonary edema. Osteopenia and mild degenerative changes mid thoracic spine. IMPRESSION: No active cardiopulmonary disease. Electronically Signed   By: Lahoma Crocker M.D.   On: 10/01/2015 20:08    EKG: Independently reviewed. Sinus rhythm, VPC  Assessment/Plan  1. Chest pain  - Described by pt as central chest "tightness," which she believes is caused by the recent doxycycline use - Symptoms are largely atypical and possibly MSK d/t recent cough, or less likely esophagitis from doxy; will treat with APAP and Protonix  - EKG on admission features a sinus rhythm with no appreciable ischemic changes; will repeat in am, sooner for CP recurrence   - Initial troponin is 0.00, will continue serial measurements q6h x3  - Full-dose ASA chew, Lopressor 5 mg IVP given, and high-intensity statin administered in ED   - Monitor on telemetry for ischemic changes   2. Acute bacterial sinusitis  - Diagnosed on 6/5 days ago and started on doxycycline, which pt did not take today as she believes it is causing the CP  - No fever or leukocytosis on admission, possibly secondary to partial treatment PTA  - Will complete treatment with Augmentin 875 mg BID    3. Sleep apnea   - Managed with CPAP at home, will continue    4. Seizure disorder  - Pt reports as stable  - Continue current-dose Dilantin    5. Hypertension - Mildly elevated on presentation, treated with Lopressor 5 mg IV x1 in setting of potential ACS  - Managed at home with lisinopril, felodipine, nifedipine  - Will continue with lisinopril and nifedipine, hold felodipine for now given uncertainty surrounding dual-dihydropyridines    DVT prophylaxis: sq heparin  Code Status: Full  Family Communication: Husband updated at the bedside    Disposition Plan: Observe on telemetry   Consults called: None   Admission status: Observation    Vianne Bulls, MD [facility] Pager [phone]  If 7PM-7AM, please contact night-coverage [email or web address] Password [email or web address]  10/01/2015, 11:06 PM

## 2015-10-01 NOTE — ED Provider Notes (Signed)
CSN: [id number]     Arrival date & time 10/01/15  1848 History   First MD Initiated Contact with Patient 10/01/15 2136     Chief Complaint  Patient presents with  . Chest Pain  PT PRESENTS WITH INTERMITTENT CP FOR 3 DAYS.  PT DENIES ANY PRIOR CARDIAC HX.  PT HAS SOME SOB.  PT HAS BEEN TREATED FOR SINUSITIS FOR 3 WEEKS AND PT IS UNSURE IF HER CP IS FROM THE MEDICATIONS SHE HAS BEEN TAKEN.   (Consider location/radiation/quality/duration/timing/severity/associated sxs/prior Treatment) Patient is a 82 y.o. female presenting with chest pain. The history is provided by the patient.  Chest Pain Pain location:  Substernal area Pain quality: pressure   Pain radiates to:  Does not radiate Pain radiates to the back: no   Pain severity:  Mild Onset quality:  Gradual Timing:  Intermittent Progression:  Waxing and waning   Past Medical History  Diagnosis Date  . Allergic rhinitis   . Skin cancer   . Hypertension   . OSA (obstructive sleep apnea)   . Irregular heart rate   . Abdominal pain, other specified site    Past Surgical History  Procedure Laterality Date  . Back surgery    . Cholecystectomy    . Total abdominal hysterectomy     Family History  Problem Relation Age of Onset  . Heart disease Father   . Skin cancer Mother   . Skin cancer Father    Social History  Substance Use Topics  . Smoking status: Never Smoker   . Smokeless tobacco: Never Used  . Alcohol Use: No   OB History    No data available     Review of Systems  Cardiovascular: Positive for chest pain.  All other systems reviewed and are negative.     Allergies  Codeine  Home Medications   Prior to Admission medications   Medication Sig Start Date End Date Taking? Authorizing Provider  aspirin 81 MG tablet Take 81 mg by mouth every other day.    Yes Historical Provider, MD  Calcium-Vitamin D 600-125 MG-UNIT TABS Take 1 tablet by mouth daily.   Yes Historical Provider, MD  cholecalciferol (VITAMIN  D) 1000 UNITS tablet Take 1,000 Units by mouth daily.   Yes Historical Provider, MD  Coenzyme Q10 60 MG TABS Take 1 tablet by mouth daily.   Yes Historical Provider, MD  doxycycline (VIBRAMYCIN) 100 MG capsule Take 100 mg by mouth 2 (two) times daily.  09/27/15 10/07/15 Yes Historical Provider, MD  felodipine (PLENDIL) 5 MG 24 hr tablet Take 5 mg by mouth daily.   Yes Historical Provider, MD  fexofenadine (ALLEGRA) 180 MG tablet Take 180 mg by mouth daily.   Yes Historical Provider, MD  folic acid (FOLVITE) 1 MG tablet Take 1 mg by mouth daily.   Yes Historical Provider, MD  lisinopril (PRINIVIL,ZESTRIL) 20 MG tablet Take 20 mg by mouth daily.   Yes Historical Provider, MD  NIFEdipine (PROCARDIA-XL/ADALAT-CC/NIFEDICAL-XL) 30 MG 24 hr tablet TK 1 T PO  D 08/13/15  Yes Historical Provider, MD  Omega-3 Fatty Acids (FISH OIL) 1000 MG CAPS Take 2 capsules by mouth daily.   Yes Historical Provider, MD  oxybutynin (DITROPAN) 5 MG tablet Take 5 mg by mouth daily.   Yes Historical Provider, MD  phenytoin (DILANTIN) 100 MG ER capsule Take 100 mg by mouth 2 (two) times daily.    Yes Historical Provider, MD  diphenoxylate-atropine (LOMOTIL) 2.5-0.025 MG per tablet Take 1 tablet by mouth  as needed for diarrhea or loose stools.    Historical Provider, MD  sodium chloride (OCEAN) 0.65 % SOLN nasal spray Place 1 spray into both nostrils as needed for congestion.    Historical Provider, MD   BP 158/63 mmHg  Pulse 89  Temp(Src) 97.9 F (36.6 C) (Oral)  Resp 18  Ht 5\' 3"  (1.6 m)  Wt 153 lb (69.4 kg)  BMI 27.11 kg/m2  SpO2 97% Physical Exam  Constitutional: She is oriented to person, place, and time. She appears well-developed and well-nourished.  HENT:  Head: Normocephalic and atraumatic.  Right Ear: External ear normal.  Left Ear: External ear normal.  Nose: Nose normal.  Mouth/Throat: Oropharynx is clear and moist.  Eyes: Conjunctivae and EOM are normal. Pupils are equal, round, and reactive to light.   Neck: Normal range of motion. Neck supple.  Cardiovascular: Normal rate, regular rhythm, normal heart sounds and intact distal pulses.   Pulmonary/Chest: Effort normal and breath sounds normal.  Abdominal: Soft. Bowel sounds are normal.  Musculoskeletal: Normal range of motion.  Neurological: She is alert and oriented to person, place, and time.  Skin: Skin is warm and dry.  Psychiatric: She has a normal mood and affect. Her behavior is normal. Judgment and thought content normal.  Nursing note and vitals reviewed.   ED Course  Procedures (including critical care time) Labs Review Labs Reviewed  BASIC METABOLIC PANEL - Abnormal; Notable for the following:    Sodium 134 (*)    CO2 21 (*)    Glucose, Bld 127 (*)    BUN 30 (*)    GFR calc non Af Amer 57 (*)    All other components within normal limits  CBC  PHENYTOIN LEVEL, TOTAL  I-STAT TROPOININ, ED    Imaging Review Dg Chest 2 View  10/01/2015  CLINICAL DATA:  Back pain, shortness of breath, dry cough EXAM: CHEST  2 VIEW COMPARISON:  02/20/2009 FINDINGS: Cardiomediastinal silhouette is stable. No acute infiltrate or pleural effusion. No pulmonary edema. Osteopenia and mild degenerative changes mid thoracic spine. IMPRESSION: No active cardiopulmonary disease. Electronically Signed   By: Lahoma Crocker M.D.   On: 10/01/2015 20:08   I have personally reviewed and evaluated these images and lab results as part of my medical decision-making.   EKG Interpretation   Date/Time:  Friday October 01 2015 18:58:13 EDT Ventricular Rate:  92 PR Interval:  151 QRS Duration: 81 QT Interval:  370 QTC Calculation: 458 R Axis:   25 Text Interpretation:  Sinus rhythm Ventricular premature complex Confirmed  by Felishia Wartman MD, Jerrilyn Messinger ([id number]) on 10/01/2015 9:36:59 PM      MDM  PT WITH SOME RISK FACTORS FOR CAD.  NL TROPONIN AND EKG.  I SPOKE WITH DR. OPYD ([facility]) WHO WILL ADMIT FOR OBS. Final diagnoses:  Chest pain, unspecified chest pain type        Isla Pence, MD 10/01/15 2226

## 2015-10-01 NOTE — ED Notes (Signed)
Pt reports constant central chest pressure for 3 days; states nothing makes it better or worse; denies cardiac hx.

## 2015-10-02 ENCOUNTER — Observation Stay (HOSPITAL_BASED_OUTPATIENT_CLINIC_OR_DEPARTMENT_OTHER): Payer: Medicare Other

## 2015-10-02 DIAGNOSIS — R079 Chest pain, unspecified: Principal | ICD-10-CM

## 2015-10-02 DIAGNOSIS — J019 Acute sinusitis, unspecified: Secondary | ICD-10-CM | POA: Diagnosis not present

## 2015-10-02 DIAGNOSIS — I10 Essential (primary) hypertension: Secondary | ICD-10-CM | POA: Diagnosis not present

## 2015-10-02 DIAGNOSIS — G40909 Epilepsy, unspecified, not intractable, without status epilepticus: Secondary | ICD-10-CM | POA: Diagnosis not present

## 2015-10-02 LAB — T4, FREE: FREE T4: 1.46 ng/dL — AB (ref 0.61–1.12)

## 2015-10-02 LAB — ECHOCARDIOGRAM COMPLETE
CHL CUP MV DEC (S): 141
E/e' ratio: 10.85
EWDT: 141 ms
FS: 23 % — AB (ref 28–44)
Height: 63 in
IVS/LV PW RATIO, ED: 1.01
LA diam end sys: 42 mm
LA diam index: 2.43 cm/m2
LASIZE: 42 mm
LAVOL: 56.4 mL
LAVOLA4C: 63.9 mL
LAVOLIN: 32.6 mL/m2
LDCA: 2.01 cm2
LV E/e' medial: 10.85
LV E/e'average: 10.85
LV PW d: 9.21 mm — AB (ref 0.6–1.1)
LV TDI E'LATERAL: 9.03
LVELAT: 9.03 cm/s
LVOTD: 16 mm
MV pk A vel: 96 m/s
MV pk E vel: 98 m/s
MVPG: 4 mmHg
TDI e' medial: 7.62
WEIGHTICAEL: 2306.89 [oz_av]

## 2015-10-02 LAB — TROPONIN I
Troponin I: <0.03 ng/mL (ref <0.031)
Troponin I: <0.03 ng/mL (ref <0.031)

## 2015-10-02 LAB — PROCALCITONIN: Procalcitonin: <0.1 ng/mL

## 2015-10-02 LAB — ALBUMIN: Albumin: 3.7 g/dL (ref 3.5–5.0)

## 2015-10-02 LAB — TSH: TSH: 2.088 u[IU]/mL (ref 0.350–4.500)

## 2015-10-02 NOTE — Progress Notes (Signed)
Patient complains of mask discomfort on CPAP. Also states "too much air" and "too much noise". Settings and mask adjusted to auto min 4 max 8. Continues to state discomfort and wishes to remove from face. Patient requests smaller mask or nasal pillows as she uses at home. Education provided on apparatus availability and offered for her to use her home mask if someone could bring it in. Patient declines at this time. RT will continue to follow.

## 2015-10-02 NOTE — Progress Notes (Signed)
RT placed patient on CPAP. Patient setting is auto titrate 5-20 cmH2O. Sterile water added to water chamber for humidification.Patient seems to be tolerating well. RT will continue to monitor.

## 2015-10-02 NOTE — Progress Notes (Signed)
Echocardiogram 2D Echocardiogram has been performed.  Tresa Res 10/02/2015, 2:41 PM

## 2015-10-02 NOTE — Evaluation (Signed)
Physical Therapy Evaluation Patient Details Name: [name] MRN: [id number] DOB: [birth year] Today's Date: 10/02/2015   History of Present Illness  82 yo female admitted with chest tightness.  She has recent history of sinusitis with use of antibiotics and remote history of right knee pain and procedures. Her husband is in the room with her and she reports he has memory problems and she can't leave him along. She also reports anxiety about being in the hospital   Clinical Impression  [name] shows inconsistency in her functional abilities and safety.  She needs 24 hour supervision for herself for safety as she recovers and I do not feel she would be able to care for her husband at this time. If there is  24 hour care at home for both of them, I would recommend home health PT.  Feel the best venue for her return to independent function in the shortest amount of time is a SNF with PT 5 days a week for a few weeks, but she declined this suggestion.  She said she did not have the money for that and she didn't know who would care for her husband.     Follow Up Recommendations Home health PT;SNF;Supervision/Assistance - 24 hour;Other (comment) (pt/husband need 24 hour supervision and with that )    Equipment Recommendations  None recommended by PT    Recommendations for Other Services OT consult     Precautions / Restrictions Precautions Precautions: Fall Restrictions Weight Bearing Restrictions: No      Mobility  Bed Mobility Overal bed mobility: Needs Assistance Bed Mobility: Supine to Sit;Sit to Supine     Supine to sit: Min guard;Mod assist Sit to supine: Min guard;Mod assist   General bed mobility comments: pt can move toward right side of bed and stand from right side of bed,  she needs assist to move toward left side of bed and was unable, with multiple attempts to stand from bed on left side   Transfers Overall transfer level: Needs assistance Equipment used:  Rolling walker (2 wheeled) Transfers: Sit to/from Stand Sit to Stand: Min assist;Mod assist         General transfer comment: mutiple attempts, pt needs frequent cues for hand placement and needs assis to initiate movement   Ambulation/Gait Ambulation/Gait assistance: Min assist Ambulation Distance (Feet): 50 Feet Assistive device: Rolling walker (2 wheeled) Gait Pattern/deviations: Step-through pattern;Decreased step length - right;Decreased step length - left;Decreased weight shift to right Gait velocity: slow   General Gait Details: noted to have arms tremble as she pushes on walker indicating general weakness and reliance on arm strength for stability.  She has difficitly with advancing right leg   Stairs            Wheelchair Mobility    Modified Rankin (Stroke Patients Only)       Balance Overall balance assessment: Needs assistance Sitting-balance support: Bilateral upper extremity supported Sitting balance-Leahy Scale: Fair (keeps trunk flexed )     Standing balance support: Bilateral upper extremity supported Standing balance-Leahy Scale: Fair                               Pertinent Vitals/Pain Pain Assessment: No/denies pain (at current time)    Home Living Family/patient expects to be discharged to:: Private residence Living Arrangements: Spouse/significant other Available Help at Discharge: Other (Comment) (sister-in- law local but limited ability to assist ) Type of Home: House  Home Access: Stairs to enter   Entrance Stairs-Number of Steps: 1 Home Layout: One level Home Equipment: [place] - 2 wheels;Cane - single point Additional Comments: pt says she does not use her RW     Prior Function Level of Independence: Independent with assistive device(s)         Comments: Pt states she does all her own housework and shopping  but she also says she is limited by cramping in legs and right knee pain      Hand Dominance         Extremity/Trunk Assessment   Upper Extremity Assessment: Generalized weakness           Lower Extremity Assessment: Generalized weakness;RLE deficits/detail;LLE deficits/detail RLE Deficits / Details: pt reports knee pain, appears to have about 3-/5 quad strength as she cannot get full active knee extension, ROM ~ 0-90  LLE Deficits / Details: generalized weakness and muscle loss   Cervical / Trunk Assessment: Kyphotic  Communication   Communication: No difficulties  Cognition Arousal/Alertness: Awake/alert Behavior During Therapy: Agitated (pt showed different levels of behavior/ functional ability) Overall Cognitive Status: Difficult to assess                      General Comments General comments (skin integrity, edema, etc.): Pt appears to have generalized weakness with inconstency in functional ability and decreased activity tolerance     Exercises        Assessment/Plan    PT Assessment    PT Diagnosis Difficulty walking;Abnormality of gait;Generalized weakness   PT Problem List    PT Treatment Interventions     PT Goals (Current goals can be found in the Care Plan section) Acute Rehab PT Goals Patient Stated Goal: to go home tonight  PT Goal Formulation: With patient Time For Goal Achievement: 10/16/15 Potential to Achieve Goals: Good    Frequency     Barriers to discharge        Co-evaluation               End of Session Equipment Utilized During Treatment: Gait belt Activity Tolerance: Patient limited by fatigue Patient left: in bed;with call bell/phone within reach;with bed alarm set;with family/visitor present Nurse Communication: Mobility status    Functional Assessment Tool Used: clincal judgement  Functional Limitation: Mobility: Walking and moving around Mobility: Walking and Moving Around Current Status [id number]): At least 40 percent but less than 60 percent impaired, limited or restricted Mobility: Walking and Moving Around  Goal Status [phone]): At least 1 percent but less than 20 percent impaired, limited or restricted    Time: 1635-1710 PT Time Calculation (min) (ACUTE ONLY): 35 min   Charges:   PT Evaluation $PT Eval Low Complexity: 1 Procedure PT Treatments $Gait Training: 8-22 mins   PT G Codes:   PT G-Codes **NOT FOR INPATIENT CLASS** Functional Assessment Tool Used: clincal judgement  Functional Limitation: Mobility: Walking and moving around Mobility: Walking and Moving Around Current Status [id number]): At least 40 percent but less than 60 percent impaired, limited or restricted Mobility: Walking and Moving Around Goal Status [phone]): At least 1 percent but less than 20 percent impaired, limited or restricted   Helene Kelp K. Owens Shark, PT  10/02/2015, 5:55 PM

## 2015-10-02 NOTE — Progress Notes (Signed)
PROGRESS NOTE    [name]  [id number] DOB: 22-May-1929 DOA: 10/01/2015 PCP: Phineas Inches, MD    Brief Narrative: [name] is a 82 y.o. female with medical history significant for allergic rhinitis, hypertension, sleep apnea, and recent diagnosis of acute bacterial sinusitis on doxycycline and presents to the ED with 3 days of intermittent pressure sensation in the central chest. Patient reports pain in her usual state of health until approximately 3 weeks ago when she developed copious rhinorrhea. Over the ensuing days, the rhinorrhea slowed, but she experienced sinus congestion with pain at the maxillary sinus. This continued to progress despite aggressive treatment of her allergic rhinitis and she was prescribed a course of doxycycline on 09/27/2015. The following day, patient noted the acute development of pressure in the central chest while at rest that resolved spontaneously over the course of several minutes. Sensation recurred several times over the next 2 days but has been persistent today. She describes the sensation as a "tightness". It is nonradiating and without associated nausea, shortness of breath, or diaphoresis. There's been no palpitations, unilateral leg swelling or tenderness, or history of VTE. She reports similar symptoms approximately 20 years ago which was initially diagnosed as angina but she reports that she was later told after some testing that it was not cardiac related and she had not experienced those symptoms again in the past 20 years.  Assessment & Plan:   Principal Problem:   Chest pain Active Problems:   Complex sleep apnea syndrome   Hypertension   Hyponatremia   Acute bacterial sinusitis   OSA on CPAP   Seizure disorder ([place])  1. Chest pain  - Described by pt as central chest "tightness," which she believes is caused by the recent doxycycline use - Symptoms are largely atypical and possibly MSK d/t recent cough, or less likely  esophagitis from doxy; will treat with APAP and Protonix  - EKG on admission features a sinus rhythm with PVC - Full-dose ASA chew.  -troponin times 2 negative.  Check ECHO.   2. Acute bacterial sinusitis  - Diagnosed on 6/5 days ago and started on doxycycline, which pt did not take today as she believes it is causing the CP  - No fever or leukocytosis on admission, possibly secondary to partial treatment PTA  - Will complete treatment with Augmentin 875 mg BID  -day 4 antibiotics.   3. Sleep apnea  - Managed with CPAP at home, will continue   4. Seizure disorder  - Pt reports as stable  - Continue current-dose Dilantin   5. Hypertension - Mildly elevated on presentation, treated with Lopressor 5 mg IV x1 in setting of potential ACS  - Continue with  lisinopril,  nifedipine  - Will continue with lisinopril and nifedipine, hold felodipine for now given uncertainty surrounding dual-dihydropyridines   6-Unsteady balance; per nursing staff patient at high risk for fall, PT CM consulted.   DVT prophylaxis: heparin  Code Status: full code.  Family Communication: husband who was at bedside.  Disposition Plan: await PT evaluation and ECHO results.    Consultants:   none  Procedures:   ECHO P  Antimicrobials:  Augmentin    Subjective: Denies chest pain. No further episode. She report chest pain worse with deep breath and cough.  Denies chest pain on exertion. Chest pain resolved yesterday after nitroglycerin.   Objective: Filed Vitals:   10/01/15 2300 10/01/15 2339 10/02/15 0418 10/02/15 0959  BP: 149/61 154/64 158/58 160/52  Pulse:  85 79   Temp:  97.8 F (36.6 C) 98.3 F (36.8 C)   TempSrc:  Oral Oral   Resp: 16 20 24    Height:  5\' 3"  (1.6 m)    Weight:  65.6 kg (144 lb 10 oz) 65.4 kg (144 lb 2.9 oz)   SpO2:  99% 100%     Intake/Output Summary (Last 24 hours) at 10/02/15 1007 Last data filed at 10/02/15 0700  Gross per 24 hour  Intake    120  ml  Output    600 ml  Net   -480 ml   Filed Weights   10/01/15 1856 10/01/15 2339 10/02/15 0418  Weight: 69.4 kg (153 lb) 65.6 kg (144 lb 10 oz) 65.4 kg (144 lb 2.9 oz)    Examination:  General exam: Appears calm and comfortable  Respiratory system: Clear to auscultation. Respiratory effort normal. Cardiovascular system: S1 & S2 heard, RRR. No JVD, murmurs, rubs, gallops or clicks. No pedal edema. Gastrointestinal system: Abdomen is nondistended, soft and nontender. No organomegaly or masses felt. Normal bowel sounds heard. Central nervous system: Alert and oriented. No focal neurological deficits. Extremities: Symmetric 5 x 5 power. Skin: No rashes, lesions or ulcers Psychiatry: Judgement and insight appear normal. Mood & affect appropriate.     Data Reviewed: I have personally reviewed following labs and imaging studies  CBC:  Recent Labs Lab 10/01/15 1933  WBC 7.0  HGB 12.3  HCT 36.4  MCV 91.9  PLT 123456   Basic Metabolic Panel:  Recent Labs Lab 10/01/15 1933  NA 134*  K 4.5  CL 104  CO2 21*  GLUCOSE 127*  BUN 30*  CREATININE 0.91  CALCIUM 9.9   GFR: Estimated Creatinine Clearance: 42.6 mL/min (by C-G formula based on Cr of 0.91). Liver Function Tests: No results for input(s): AST, ALT, ALKPHOS, BILITOT, PROT, ALBUMIN in the last 168 hours. No results for input(s): LIPASE, AMYLASE in the last 168 hours. No results for input(s): AMMONIA in the last 168 hours. Coagulation Profile: No results for input(s): INR, PROTIME in the last 168 hours. Cardiac Enzymes:  Recent Labs Lab 10/01/15 2312 10/02/15 0530  TROPONINI <0.03 <0.03   BNP (last 3 results) No results for input(s): PROBNP in the last 8760 hours. HbA1C: No results for input(s): HGBA1C in the last 72 hours. CBG: No results for input(s): GLUCAP in the last 168 hours. Lipid Profile: No results for input(s): CHOL, HDL, LDLCALC, TRIG, CHOLHDL, LDLDIRECT in the last 72 hours. Thyroid Function  Tests:  Recent Labs  10/01/15 2312  TSH 2.088   Anemia Panel: No results for input(s): VITAMINB12, FOLATE, FERRITIN, TIBC, IRON, RETICCTPCT in the last 72 hours. Sepsis Labs:  Recent Labs Lab 10/01/15 2312  PROCALCITON <0.10    No results found for this or any previous visit (from the past 240 hour(s)).       Radiology Studies: Dg Chest 2 View  10/01/2015  CLINICAL DATA:  Back pain, shortness of breath, dry cough EXAM: CHEST  2 VIEW COMPARISON:  02/20/2009 FINDINGS: Cardiomediastinal silhouette is stable. No acute infiltrate or pleural effusion. No pulmonary edema. Osteopenia and mild degenerative changes mid thoracic spine. IMPRESSION: No active cardiopulmonary disease. Electronically Signed   By: Lahoma Crocker M.D.   On: 10/01/2015 20:08        Scheduled Meds: . amoxicillin-clavulanate  1 tablet Oral BID  . aspirin EC  81 mg Oral QODAY  . calcium-vitamin D  1 tablet Oral Daily  . fluticasone  2  spray Each Nare Daily  . folic acid  1 mg Oral Daily  . heparin  5,000 Units Subcutaneous Q8H  . lisinopril  20 mg Oral Daily  . loratadine  10 mg Oral Daily  . NIFEdipine  30 mg Oral Daily  . omega-3 acid ethyl esters  1 g Oral Daily  . oxybutynin  5 mg Oral Daily  . pantoprazole  40 mg Oral Daily  . phenytoin  100 mg Oral BID   Continuous Infusions:       Time spent: 35 minutes.     Elmarie Shiley, MD [facility] Pager [phone]  If 7PM-7AM, please contact night-coverage [email or web address] Password [email or web address] 10/02/2015, 10:07 AM

## 2015-10-03 DIAGNOSIS — R079 Chest pain, unspecified: Secondary | ICD-10-CM | POA: Diagnosis not present

## 2015-10-03 DIAGNOSIS — I10 Essential (primary) hypertension: Secondary | ICD-10-CM | POA: Diagnosis not present

## 2015-10-03 DIAGNOSIS — J019 Acute sinusitis, unspecified: Secondary | ICD-10-CM | POA: Diagnosis not present

## 2015-10-03 DIAGNOSIS — G40909 Epilepsy, unspecified, not intractable, without status epilepticus: Secondary | ICD-10-CM | POA: Diagnosis not present

## 2015-10-03 LAB — CBC
HEMATOCRIT: 32.7 % — AB (ref 36.0–46.0)
HEMOGLOBIN: 11.3 g/dL — AB (ref 12.0–15.0)
MCH: 31 pg (ref 26.0–34.0)
MCHC: 34.6 g/dL (ref 30.0–36.0)
MCV: 89.8 fL (ref 78.0–100.0)
Platelets: 156 10*3/uL (ref 150–400)
RBC: 3.64 MIL/uL — ABNORMAL LOW (ref 3.87–5.11)
RDW: 12.7 % (ref 11.5–15.5)
WBC: 6.9 10*3/uL (ref 4.0–10.5)

## 2015-10-03 LAB — BASIC METABOLIC PANEL
Anion gap: 7 (ref 5–15)
BUN: 21 mg/dL — ABNORMAL HIGH (ref 6–20)
CALCIUM: 8.8 mg/dL — AB (ref 8.9–10.3)
CO2: 22 mmol/L (ref 22–32)
Chloride: 103 mmol/L (ref 101–111)
Creatinine, Ser: 0.81 mg/dL (ref 0.44–1.00)
Glucose, Bld: 108 mg/dL — ABNORMAL HIGH (ref 65–99)
Potassium: 4 mmol/L (ref 3.5–5.1)
SODIUM: 132 mmol/L — AB (ref 135–145)

## 2015-10-03 LAB — PROCALCITONIN

## 2015-10-03 MED ORDER — AMOXICILLIN-POT CLAVULANATE 875-125 MG PO TABS: 1.0000 | ORAL_TABLET | Freq: Two times a day (BID) | ORAL | Status: DC

## 2015-10-03 MED ORDER — PANTOPRAZOLE SODIUM 40 MG PO TBEC: 40.0000 mg | DELAYED_RELEASE_TABLET | Freq: Every day | ORAL | Status: DC

## 2015-10-03 MED ORDER — NITROGLYCERIN 0.4 MG SL SUBL: 0.4000 mg | SUBLINGUAL_TABLET | SUBLINGUAL | Status: DC | PRN

## 2015-10-03 NOTE — Discharge Summary (Signed)
Physician Discharge Summary  [name] [id number] DOB: 10-29-1929 DOA: 10/01/2015  PCP: Phineas Inches, MD  Admit date: 10/01/2015 Discharge date: 10/03/2015  Admitted From: Home Disposition: Home  Recommendations for Outpatient Follow-up:  1. Follow up with PCP in 1-2 weeks 2. Please obtain BMP/CBC in one week 3. Further evaluation for chest pain as needed.   Home Health: Yes  Equipment/Devices; none  Discharge Condition: Stable CODE STATUS:Full code.  Diet recommendation: Heart healthy diet   Brief/Interim Summary: 1. Chest pain ; possible esophagitis.  - Described by pt as central chest "tightness," which she believes is caused by the recent doxycycline use - Symptoms are largely atypical and possibly MSK d/t recent cough, or less likely esophagitis from doxy; will treat with APAP and Protonix  - EKG on admission features a sinus rhythm with PVC - Full-dose ASA chew.  -troponin times 3 negative.  -ECHO normal EF, no wall motion abnormalities.   2. Acute bacterial sinusitis  - Diagnosed on 6/5 days ago and started on doxycycline, which pt did not take today as she believes it is causing the CP  - No fever or leukocytosis on admission, possibly secondary to partial treatment PTA  - Will complete treatment with Augmentin 875 mg BID  -day 5 antibiotics.   3. Sleep apnea  - Managed with CPAP at home, will continue   4. Seizure disorder  - Pt reports as stable  - Continue current-dose Dilantin   5. Hypertension - Mildly elevated on presentation, treated with Lopressor 5 mg IV x1 in setting of potential ACS  - Continue with lisinopril, nifedipine  - Will continue with lisinopril and nifedipine, hold felodipine for now given uncertainty surrounding dual-dihydropyridines   6-Unsteady balance; pt recommend SNF//HH 24  Patient and husband decline SNF. CM consulted. Home health orded: PT, RN, Aid, SW   Discharge Diagnoses:    Chest pain, possible  esophagitis.    Complex sleep apnea syndrome   Hypertension   Hyponatremia   Acute bacterial sinusitis   OSA on CPAP   Seizure disorder [facility])    Discharge Instructions  Discharge Instructions    Diet - low sodium heart healthy    Complete by:  As directed      Increase activity slowly    Complete by:  As directed             Medication List    STOP taking these medications        diphenoxylate-atropine 2.5-0.025 MG tablet  Commonly known as:  LOMOTIL     doxycycline 100 MG capsule  Commonly known as:  VIBRAMYCIN     felodipine 5 MG 24 hr tablet  Commonly known as:  PLENDIL      TAKE these medications        amoxicillin-clavulanate 875-125 MG tablet  Commonly known as:  AUGMENTIN  Take 1 tablet by mouth 2 (two) times daily.     aspirin 81 MG tablet  Take 81 mg by mouth every other day.     Calcium-Vitamin D 600-125 MG-UNIT Tabs  Take 1 tablet by mouth daily.     cholecalciferol 1000 units tablet  Commonly known as:  VITAMIN D  Take 1,000 Units by mouth daily.     Coenzyme Q10 60 MG Tabs  Take 1 tablet by mouth daily.     fexofenadine 180 MG tablet  Commonly known as:  ALLEGRA  Take 180 mg by mouth daily.     Fish Oil 1000 MG Caps  Take 2 capsules by mouth daily.     folic acid 1 MG tablet  Commonly known as:  FOLVITE  Take 1 mg by mouth daily.     lisinopril 20 MG tablet  Commonly known as:  PRINIVIL,ZESTRIL  Take 20 mg by mouth daily.     NIFEdipine 30 MG 24 hr tablet  Commonly known as:  PROCARDIA-XL/ADALAT-CC/NIFEDICAL-XL  TK 1 T PO  D     nitroGLYCERIN 0.4 MG SL tablet  Commonly known as:  NITROSTAT  Place 1 tablet (0.4 mg total) under the tongue every 5 (five) minutes as needed for chest pain.     oxybutynin 5 MG tablet  Commonly known as:  DITROPAN  Take 5 mg by mouth daily.     pantoprazole 40 MG tablet  Commonly known as:  PROTONIX  Take 1 tablet (40 mg total) by mouth daily.     phenytoin 100 MG ER capsule  Commonly known  as:  DILANTIN  Take 100 mg by mouth 2 (two) times daily.     sodium chloride 0.65 % Soln nasal spray  Commonly known as:  OCEAN  Place 1 spray into both nostrils as needed for congestion.           Follow-up Information    Follow up with BOUSKA,DAVID E, MD In 1 week.   Specialty:  Family Medicine   Contact information:   [address] [place] [address] [phone]      Allergies  Allergen Reactions  . Codeine Other (See Comments)    Consultations:  none   Procedures/Studies: Dg Chest 2 View  10/01/2015  CLINICAL DATA:  Back pain, shortness of breath, dry cough EXAM: CHEST  2 VIEW COMPARISON:  02/20/2009 FINDINGS: Cardiomediastinal silhouette is stable. No acute infiltrate or pleural effusion. No pulmonary edema. Osteopenia and mild degenerative changes mid thoracic spine. IMPRESSION: No active cardiopulmonary disease. Electronically Signed   By: Lahoma Crocker M.D.   On: 10/01/2015 20:08    ECHO; - Left ventricle: The cavity size was normal. Systolic function was  normal. The estimated ejection fraction was in the range of 60%  to 65%. Wall motion was normal; there were no regional wall  motion abnormalities. Left ventricular diastolic function  parameters were normal. - Mitral valve: Calcified annulus. There was mild regurgitation. - Left atrium: The atrium was mildly dilated. - Atrial septum: No defect or patent foramen ovale was identified.    Subjective:   Discharge Exam: Filed Vitals:   10/02/15 2203 10/03/15 0431  BP: 146/68 144/64  Pulse: 91 101  Temp: 98 F (36.7 C) 98.4 F (36.9 C)  Resp: 18 16   Filed Vitals:   10/02/15 1500 10/02/15 2031 10/02/15 2203 10/03/15 0431  BP: 151/62  146/68 144/64  Pulse: 95 94 91 101  Temp: 98.3 F (36.8 C)  98 F (36.7 C) 98.4 F (36.9 C)  TempSrc: Oral  Oral Oral  Resp: 20 18 18 16   Height:      Weight:    64.6 kg (142 lb 6.7 oz)  SpO2: 99% 98% 98% 99%    General: Pt is alert,  awake, not in acute distress Cardiovascular: RRR, S1/S2 +, no rubs, no gallops Respiratory: CTA bilaterally, no wheezing, no rhonchi Abdominal: Soft, NT, ND, bowel sounds + Extremities: no edema, no cyanosis    The results of significant diagnostics from this hospitalization (including imaging, microbiology, ancillary and laboratory) are listed below for reference.  Microbiology: No results found for this or any previous visit (from the past 240 hour(s)).   Labs: BNP (last 3 results) No results for input(s): BNP in the last 8760 hours. Basic Metabolic Panel:  Recent Labs Lab 10/01/15 1933 10/03/15 0516  NA 134* 132*  K 4.5 4.0  CL 104 103  CO2 21* 22  GLUCOSE 127* 108*  BUN 30* 21*  CREATININE 0.91 0.81  CALCIUM 9.9 8.8*   Liver Function Tests:  Recent Labs Lab 10/02/15 1139  ALBUMIN 3.7   No results for input(s): LIPASE, AMYLASE in the last 168 hours. No results for input(s): AMMONIA in the last 168 hours. CBC:  Recent Labs Lab 10/01/15 1933 10/03/15 0516  WBC 7.0 6.9  HGB 12.3 11.3*  HCT 36.4 32.7*  MCV 91.9 89.8  PLT 196 156   Cardiac Enzymes:  Recent Labs Lab 10/01/15 2312 10/02/15 0530 10/02/15 1139  TROPONINI <0.03 <0.03 <0.03   BNP: Invalid input(s): POCBNP CBG: No results for input(s): GLUCAP in the last 168 hours. D-Dimer No results for input(s): DDIMER in the last 72 hours. Hgb A1c No results for input(s): HGBA1C in the last 72 hours. Lipid Profile No results for input(s): CHOL, HDL, LDLCALC, TRIG, CHOLHDL, LDLDIRECT in the last 72 hours. Thyroid function studies  Recent Labs  10/01/15 2312  TSH 2.088   Anemia work up No results for input(s): VITAMINB12, FOLATE, FERRITIN, TIBC, IRON, RETICCTPCT in the last 72 hours. Urinalysis No results found for: COLORURINE, APPEARANCEUR, LABSPEC, [place], GLUCOSEU, HGBUR, BILIRUBINUR, KETONESUR, PROTEINUR, UROBILINOGEN, NITRITE, LEUKOCYTESUR Sepsis Labs Invalid input(s):  PROCALCITONIN,  WBC,  LACTICIDVEN Microbiology No results found for this or any previous visit (from the past 240 hour(s)).   Time coordinating discharge: Over 30 minutes  SIGNED:   Elmarie Shiley, MD  [facility] 10/03/2015, 8:47 AM Pager   If 7PM-7AM, please contact night-coverage [email or web address] Password [email or web address]

## 2015-10-03 NOTE — Progress Notes (Signed)
Nutrition Brief Note  Patient identified on the Malnutrition Screening Tool (MST) Report   Wt Readings from Last 15 Encounters:  10/03/15 142 lb 6.7 oz (64.6 kg)  06/10/14 150 lb (68.04 kg)  06/04/13 149 lb 3.2 oz (67.677 kg)  06/04/12 147 lb 6.4 oz (66.86 kg)  08/07/11 153 lb 9.6 oz (69.673 kg)  05/30/11 157 lb (71.215 kg)  05/30/10 161 lb 4 oz (73.143 kg)  01/05/09 154 lb (69.854 kg)  11/10/08 152 lb (68.947 kg)    Body mass index is 25.23 kg/(m^2). Patient meets criteria for overweight based on current BMI.   Current diet order is Regular, patient is consuming approximately 100% of meals at this time. Labs and medications reviewed.   No nutrition interventions warranted at this time. If nutrition issues arise, please consult RD.   Clayton Bibles, MS, RD, LDN Pager: [phone] After Hours Pager: [phone]

## 2015-10-03 NOTE — Care Management Note (Signed)
Case Management Note  Patient Details  Name: [name] MRN: [id number] Date of Birth: Jun 04, 1929  Subjective/Objective:  Chest pain, Acute bacterial sinusitis                   Action/Plan: Discharge Planning: AVS reviewed:  NCM spoke to pt and husband at bedside. Offered choice for HH/list provided. Pt agreeable to [facility] for HH. Pt states she has RW and cane at home. Lives at home with husband. States she does not want SNF-rehab. She wants to dc to home.   PCP -Bernerd Limbo MD Expected Discharge Date:  10/03/2015               Expected Discharge Plan:  [place]  In-House Referral:  Clinical Social Work  Discharge planning Services  CM Consult  Post Acute Care Choice:  Home Health Choice offered to:  Patient  DME Arranged:  N/A DME Agency:  NA  HH Arranged:  RN, PT, Social Work, Nurse's Aide [place] Agency:  [facility]  Status of Service:  Completed, signed off  Medicare Important Message Given:    Date Medicare IM Given:    Medicare IM give by:    Date Additional Medicare IM Given:    Additional Medicare Important Message give by:     If discussed at [place] of Stay Meetings, dates discussed:    Additional Comments:  Erenest Rasher, RN 10/03/2015, 10:12 AM

## 2015-10-03 NOTE — Care Management Obs Status (Signed)
[place] NOTIFICATION   Patient Details  Name: [name] MRN: [id number] Date of Birth: 1930/04/10   Medicare Observation Status Notification Given:  Yes    Erenest Rasher, RN 10/03/2015, 9:34 AM

## 2015-10-05 ENCOUNTER — Emergency Department (HOSPITAL_COMMUNITY): Payer: Medicare Other

## 2015-10-05 ENCOUNTER — Encounter (HOSPITAL_COMMUNITY): Payer: Self-pay | Admitting: Emergency Medicine

## 2015-10-05 ENCOUNTER — Emergency Department (HOSPITAL_COMMUNITY)
Admission: EM | Admit: 2015-10-05 | Discharge: 2015-10-06 | Disposition: A | Discharge status: Home / Self Care | Payer: Medicare Other | Source: Home / Self Care | Attending: Emergency Medicine | Admitting: Emergency Medicine

## 2015-10-05 DIAGNOSIS — Z79899 Other long term (current) drug therapy: Secondary | ICD-10-CM

## 2015-10-05 DIAGNOSIS — I10 Essential (primary) hypertension: Secondary | ICD-10-CM

## 2015-10-05 DIAGNOSIS — Z7982 Long term (current) use of aspirin: Secondary | ICD-10-CM | POA: Insufficient documentation

## 2015-10-05 DIAGNOSIS — Z91040 Latex allergy status: Secondary | ICD-10-CM | POA: Insufficient documentation

## 2015-10-05 DIAGNOSIS — R791 Abnormal coagulation profile: Secondary | ICD-10-CM

## 2015-10-05 DIAGNOSIS — J189 Pneumonia, unspecified organism: Secondary | ICD-10-CM | POA: Insufficient documentation

## 2015-10-05 DIAGNOSIS — Z85820 Personal history of malignant melanoma of skin: Secondary | ICD-10-CM | POA: Insufficient documentation

## 2015-10-05 LAB — COMPREHENSIVE METABOLIC PANEL
ALBUMIN: 3.3 g/dL — AB (ref 3.5–5.0)
ALK PHOS: 71 U/L (ref 38–126)
ALT: 25 U/L (ref 14–54)
ANION GAP: 7 (ref 5–15)
AST: 23 U/L (ref 15–41)
BILIRUBIN TOTAL: 0.5 mg/dL (ref 0.3–1.2)
BUN: 33 mg/dL — ABNORMAL HIGH (ref 6–20)
CALCIUM: 9 mg/dL (ref 8.9–10.3)
CO2: 21 mmol/L — ABNORMAL LOW (ref 22–32)
Chloride: 104 mmol/L (ref 101–111)
Creatinine, Ser: 0.98 mg/dL (ref 0.44–1.00)
GFR calc non Af Amer: 52 mL/min — ABNORMAL LOW (ref >60)
GLUCOSE: 137 mg/dL — AB (ref 65–99)
POTASSIUM: 4.1 mmol/L (ref 3.5–5.1)
Sodium: 132 mmol/L — ABNORMAL LOW (ref 135–145)
TOTAL PROTEIN: 6.1 g/dL — AB (ref 6.5–8.1)

## 2015-10-05 LAB — PROTIME-INR
INR: 1.28 (ref 0.00–1.49)
PROTHROMBIN TIME: 16.2 s — AB (ref 11.6–15.2)

## 2015-10-05 LAB — CBC
HEMATOCRIT: 32.4 % — AB (ref 36.0–46.0)
Hemoglobin: 10.7 g/dL — ABNORMAL LOW (ref 12.0–15.0)
MCH: 30.2 pg (ref 26.0–34.0)
MCHC: 33 g/dL (ref 30.0–36.0)
MCV: 91.5 fL (ref 78.0–100.0)
Platelets: 152 10*3/uL (ref 150–400)
RBC: 3.54 MIL/uL — ABNORMAL LOW (ref 3.87–5.11)
RDW: 12.8 % (ref 11.5–15.5)
WBC: 8.6 10*3/uL (ref 4.0–10.5)

## 2015-10-05 LAB — I-STAT TROPONIN, ED: TROPONIN I, POC: 0.01 ng/mL (ref 0.00–0.08)

## 2015-10-05 LAB — D-DIMER, QUANTITATIVE: D-Dimer, Quant: 1.9 ug/mL-FEU — ABNORMAL HIGH (ref 0.00–0.50)

## 2015-10-05 MED ORDER — IOPAMIDOL (ISOVUE-370) INJECTION 76%
INTRAVENOUS | Status: AC
  Administered 2015-10-06: 100 mL
  Filled 2015-10-05: qty 100

## 2015-10-05 MED ORDER — SODIUM CHLORIDE 0.9 % IV SOLN
INTRAVENOUS | Status: DC
  Administered 2015-10-05: 23:00:00 via INTRAVENOUS

## 2015-10-05 NOTE — ED Provider Notes (Signed)
CSN: [id number]     Arrival date & time 10/05/15  2153 History   First MD Initiated Contact with Patient 10/05/15 2159     Chief Complaint  Patient presents with  . Chest Pain   HPI Patient presents to the emergency room for evaluation of chest pain. The patient presents to the emergency room with complaints of chest pain.  Patient states she's had a constant pressure in her chest for the last day. Pain does not radiate. She has had some trouble with coughing and congestion. She was recently in the hospital and told she had a sinus infection. She has felt febrile and has had generalized weakness. She denies any vomiting or diarrhea. She denies any shortness of breath.  Past Medical History  Diagnosis Date  . Allergic rhinitis   . Skin cancer   . Hypertension   . OSA (obstructive sleep apnea)   . Irregular heart rate   . Abdominal pain, other specified site    Past Surgical History  Procedure Laterality Date  . Back surgery    . Cholecystectomy    . Total abdominal hysterectomy     Family History  Problem Relation Age of Onset  . Heart disease Father   . Skin cancer Mother   . Skin cancer Father    Social History  Substance Use Topics  . Smoking status: Never Smoker   . Smokeless tobacco: Never Used  . Alcohol Use: No   OB History    No data available     Review of Systems  All other systems reviewed and are negative.     Allergies  Codeine; Latex; and Tape  Home Medications   Prior to Admission medications   Medication Sig Start Date End Date Taking? Authorizing Provider  amLODipine (NORVASC) 2.5 MG tablet Take 2.5 mg by mouth daily.   Yes Historical Provider, MD  amoxicillin-clavulanate (AUGMENTIN) 875-125 MG tablet Take 1 tablet by mouth 2 (two) times daily. 10/03/15  Yes Belkys A Regalado, MD  doxycycline (VIBRAMYCIN) 100 MG capsule Take 100 mg by mouth 2 (two) times daily.   Yes Historical Provider, MD  lisinopril (PRINIVIL,ZESTRIL) 20 MG tablet Take 20 mg by  mouth daily.   Yes Historical Provider, MD  NIFEdipine (PROCARDIA-XL/ADALAT-CC/NIFEDICAL-XL) 30 MG 24 hr tablet Take 30 mg by mouth daily 08/13/15  Yes Historical Provider, MD  nitroGLYCERIN (NITROSTAT) 0.4 MG SL tablet Place 1 tablet (0.4 mg total) under the tongue every 5 (five) minutes as needed for chest pain. 10/03/15  Yes Belkys A Regalado, MD  oxybutynin (DITROPAN) 5 MG tablet Take 5 mg by mouth daily.   Yes Historical Provider, MD  pantoprazole (PROTONIX) 40 MG tablet Take 1 tablet (40 mg total) by mouth daily. 10/03/15  Yes Belkys A Regalado, MD  phenytoin (DILANTIN) 100 MG ER capsule Take 200 mg by mouth at bedtime.    Yes Historical Provider, MD  aspirin 81 MG tablet Take 81 mg by mouth every other day.     Historical Provider, MD  Calcium-Vitamin D 600-125 MG-UNIT TABS Take 1 tablet by mouth daily.    Historical Provider, MD  cholecalciferol (VITAMIN D) 1000 UNITS tablet Take 1,000 Units by mouth daily.    Historical Provider, MD  Coenzyme Q10 60 MG TABS Take 1 tablet by mouth daily.    Historical Provider, MD  fexofenadine (ALLEGRA) 180 MG tablet Take 180 mg by mouth daily.    Historical Provider, MD  folic acid (FOLVITE) 1 MG tablet Take 1 mg  by mouth daily.    Historical Provider, MD  Omega-3 Fatty Acids (FISH OIL) 1000 MG CAPS Take 2 capsules by mouth daily.    Historical Provider, MD  sodium chloride (OCEAN) 0.65 % SOLN nasal spray Place 1 spray into both nostrils as needed for congestion.    Historical Provider, MD   BP 136/56 mmHg  Pulse 97  Temp(Src) 97.7 F (36.5 C) (Oral)  Resp 20  SpO2 99% Physical Exam  Constitutional: No distress.  HENT:  Head: Normocephalic and atraumatic.  Right Ear: External ear normal.  Left Ear: External ear normal.  Eyes: Conjunctivae are normal. Right eye exhibits no discharge. Left eye exhibits no discharge. No scleral icterus.  Neck: Neck supple. No tracheal deviation present.  Cardiovascular: Normal rate, regular rhythm and intact distal  pulses.   Pulmonary/Chest: Effort normal and breath sounds normal. No stridor. No respiratory distress. She has no wheezes. She has no rales. She exhibits no tenderness.  Abdominal: Soft. Bowel sounds are normal. She exhibits no distension. There is no tenderness. There is no rebound and no guarding.  Musculoskeletal: She exhibits no edema or tenderness.  Neurological: She is alert. She has normal strength. No cranial nerve deficit (no facial droop, extraocular movements intact, no slurred speech) or sensory deficit. She exhibits normal muscle tone. She displays no seizure activity. Coordination normal.  Skin: Skin is warm and dry. No rash noted.  Psychiatric: She has a normal mood and affect.  Nursing note and vitals reviewed.   ED Course  Procedures (including critical care time) Labs Review Labs Reviewed  CBC - Abnormal; Notable for the following:    RBC 3.54 (*)    Hemoglobin 10.7 (*)    HCT 32.4 (*)    All other components within normal limits  COMPREHENSIVE METABOLIC PANEL - Abnormal; Notable for the following:    Sodium 132 (*)    CO2 21 (*)    Glucose, Bld 137 (*)    BUN 33 (*)    Total Protein 6.1 (*)    Albumin 3.3 (*)    GFR calc non Af Amer 52 (*)    All other components within normal limits  PROTIME-INR - Abnormal; Notable for the following:    Prothrombin Time 16.2 (*)    All other components within normal limits  D-DIMER, QUANTITATIVE (NOT AT [facility]) - Abnormal; Notable for the following:    D-Dimer, Quant 1.90 (*)    All other components within normal limits  I-STAT TROPOININ, ED    Imaging Review Dg Chest 2 View  10/05/2015  CLINICAL DATA:  Mid chest pain for 3 days EXAM: CHEST  2 VIEW COMPARISON:  10/01/2015 FINDINGS: Normal heart size and pulmonary vascularity. No focal airspace disease or consolidation in the lungs. No blunting of costophrenic angles. No pneumothorax. Mediastinal contours appear intact. Degenerative changes in the spine and shoulders.  Calcification of the aorta. IMPRESSION: No active cardiopulmonary disease. Electronically Signed   By: Lucienne Capers M.D.   On: 10/05/2015 23:17   I have personally reviewed and evaluated these images and lab results as part of my medical decision-making.   EKG Interpretation   Date/Time:  Tuesday October 05 2015 22:21:35 EDT Ventricular Rate:  100 PR Interval:  148 QRS Duration: 80 QT Interval:  369 QTC Calculation: 476 R Axis:   44 Text Interpretation:  Sinus tachycardia with irregular rate Probable  anteroseptal infarct, old No significant change since last tracing  Confirmed by Chiann Goffredo  MD-J, Mandeep Kiser [id number]) on 10/05/2015  10:26:49 PM      MDM   Pt was recently admitted to the hospital for chest pain.  Diagnosed with a sinus infection, had a cardiac rule out and was released.  Presents today with recurrent chest pain.  CXR without pna.  D dimer is elevated.  Will ct chest to rule out PE.    Dorie Rank, MD 10/05/15 [phone]

## 2015-10-05 NOTE — ED Notes (Signed)
Pt arrives via EMS for cp x several hours today, was discharged from [facility] yesterday for sinus infection. Pt reports centralized chest pain after chasing her husband in the yard all day. Pt is his primary caretaker as he has dementia and has been chasing him around the house all day trying to prevent him from getting stung by bees, for which she was unsuccessful. Pt feels weak, and has had a low grade fever all day per EMS. tremoring in triage. Pt received 324 aspirin and 4 SL nitros PTA.

## 2015-10-06 ENCOUNTER — Emergency Department (HOSPITAL_COMMUNITY): Payer: Medicare Other

## 2015-10-06 LAB — I-STAT TROPONIN, ED: TROPONIN I, POC: 0.01 ng/mL (ref 0.00–0.08)

## 2015-10-06 MED ORDER — LEVOFLOXACIN 750 MG PO TABS: 750.0000 mg | ORAL_TABLET | Freq: Every day | ORAL | Status: DC

## 2015-10-06 MED ORDER — LEVOFLOXACIN 750 MG PO TABS: 750.0000 mg | ORAL_TABLET | Freq: Once | ORAL | Status: DC

## 2015-10-06 MED ORDER — ACETAMINOPHEN 325 MG PO TABS
650.0000 mg | ORAL_TABLET | Freq: Once | ORAL | Status: AC
  Administered 2015-10-06: 650 mg via ORAL
  Filled 2015-10-06: qty 2

## 2015-10-06 NOTE — Discharge Instructions (Signed)

## 2015-10-06 NOTE — ED Notes (Signed)
Patient left at this time with all belongings. 

## 2015-10-06 NOTE — ED Provider Notes (Signed)
Patient signed out to me to follow-up on CT angiogram chest. Patient seen for chest pain. She was just recently hospitalized and had complete cardiac workup, felt to be expressing noncardiac chest pain. Patient returns with chest pain, had an elevated d-dimer. CT angiography was therefore ordered. CT has been reviewed. No evidence of PE noted. She does have areas of possible pneumonia. Will treat empirically with Levaquin. She is appropriate for discharge and outpatient management. Follow-up with PCP one week.  Results for orders placed or performed during the hospital encounter of 10/05/15  CBC  Result Value Ref Range   WBC 8.6 4.0 - 10.5 K/uL   RBC 3.54 (L) 3.87 - 5.11 MIL/uL   Hemoglobin 10.7 (L) 12.0 - 15.0 g/dL   HCT 32.4 (L) 36.0 - 46.0 %   MCV 91.5 78.0 - 100.0 fL   MCH 30.2 26.0 - 34.0 pg   MCHC 33.0 30.0 - 36.0 g/dL   RDW 12.8 11.5 - 15.5 %   Platelets 152 150 - 400 K/uL  Comprehensive metabolic panel  Result Value Ref Range   Sodium 132 (L) 135 - 145 mmol/L   Potassium 4.1 3.5 - 5.1 mmol/L   Chloride 104 101 - 111 mmol/L   CO2 21 (L) 22 - 32 mmol/L   Glucose, Bld 137 (H) 65 - 99 mg/dL   BUN 33 (H) 6 - 20 mg/dL   Creatinine, Ser 0.98 0.44 - 1.00 mg/dL   Calcium 9.0 8.9 - 10.3 mg/dL   Total Protein 6.1 (L) 6.5 - 8.1 g/dL   Albumin 3.3 (L) 3.5 - 5.0 g/dL   AST 23 15 - 41 U/L   ALT 25 14 - 54 U/L   Alkaline Phosphatase 71 38 - 126 U/L   Total Bilirubin 0.5 0.3 - 1.2 mg/dL   GFR calc non Af Amer 52 (L) >60 mL/min   GFR calc Af Amer >60 >60 mL/min   Anion gap 7 5 - 15  Protime-INR  Result Value Ref Range   Prothrombin Time 16.2 (H) 11.6 - 15.2 seconds   INR 1.28 0.00 - 1.49  D-dimer, quantitative (not at [facility])  Result Value Ref Range   D-Dimer, Quant 1.90 (H) 0.00 - 0.50 ug/mL-FEU  I-stat troponin, ED (not at [facility])  Result Value Ref Range   Troponin i, poc 0.01 0.00 - 0.08 ng/mL   Comment 3          I-stat troponin, ED  Result Value Ref Range   Troponin i, poc 0.01  0.00 - 0.08 ng/mL   Comment 3           Dg Chest 2 View  10/05/2015  CLINICAL DATA:  Mid chest pain for 3 days EXAM: CHEST  2 VIEW COMPARISON:  10/01/2015 FINDINGS: Normal heart size and pulmonary vascularity. No focal airspace disease or consolidation in the lungs. No blunting of costophrenic angles. No pneumothorax. Mediastinal contours appear intact. Degenerative changes in the spine and shoulders. Calcification of the aorta. IMPRESSION: No active cardiopulmonary disease. Electronically Signed   By: Lucienne Capers M.D.   On: 10/05/2015 23:17   Dg Chest 2 View  10/01/2015  CLINICAL DATA:  Back pain, shortness of breath, dry cough EXAM: CHEST  2 VIEW COMPARISON:  02/20/2009 FINDINGS: Cardiomediastinal silhouette is stable. No acute infiltrate or pleural effusion. No pulmonary edema. Osteopenia and mild degenerative changes mid thoracic spine. IMPRESSION: No active cardiopulmonary disease. Electronically Signed   By: Lahoma Crocker M.D.   On: 10/01/2015 20:08  Ct Angio Chest Pe W/cm &/or Wo Cm  10/06/2015  CLINICAL DATA:  Acute onset of generalized chest pain. Elevated D-dimer. Initial encounter. EXAM: CT ANGIOGRAPHY CHEST WITH CONTRAST TECHNIQUE: Multidetector CT imaging of the chest was performed using the standard protocol during bolus administration of intravenous contrast. Multiplanar CT image reconstructions and MIPs were obtained to evaluate the vascular anatomy. CONTRAST:  100 mL of Isovue 370 IV contrast COMPARISON:  Chest radiograph performed 10/05/2015 FINDINGS: There is no evidence of pulmonary embolus. Minimal patchy nodular opacities are noted within both lungs. Some of this reflects atelectasis, though mild infection cannot be excluded. There is no evidence of pleural effusion or pneumothorax. Diffuse coronary artery calcifications are seen. A borderline prominent 1.1 cm precarinal node is noted. No additional mediastinal lymphadenopathy is seen. No pericardial effusion is identified.  Scattered calcification is noted along the aortic arch and proximal great vessels. No axillary lymphadenopathy is seen. The thyroid gland is unremarkable in appearance. The visualized portions of the liver and spleen are unremarkable. The patient is status post cholecystectomy, with clips noted at the gallbladder fossa. The visualized portions of the pancreas, adrenal glands and kidneys are within normal limits. Scattered calcification is noted along the proximal abdominal aorta. No acute osseous abnormalities are seen. Mild nonspecific sclerotic change is noted along the lower thoracic and upper lumbar spine. Review of the MIP images confirms the above findings. IMPRESSION: 1. No evidence of pulmonary embolus. 2. Minimal patchy nodular opacities within both lungs. Some of this reflects atelectasis, though mild infection cannot be excluded. 3. Diffuse coronary artery calcifications seen. 4. Borderline prominent 1.1 cm precarinal node noted. Electronically Signed   By: Garald Balding M.D.   On: 10/06/2015 02:34      Orpah Greek, MD 10/06/15 [phone]

## 2015-10-08 ENCOUNTER — Emergency Department (HOSPITAL_COMMUNITY): Payer: Medicare Other

## 2015-10-08 ENCOUNTER — Encounter (HOSPITAL_COMMUNITY): Payer: Self-pay | Admitting: Emergency Medicine

## 2015-10-08 ENCOUNTER — Inpatient Hospital Stay (HOSPITAL_COMMUNITY)
Admission: EM | Admit: 2015-10-08 | Discharge: 2015-10-11 | DRG: 194 | Disposition: A | Discharge status: Skilled Nursing Facility | Payer: Medicare Other | Attending: Family Medicine | Admitting: Family Medicine

## 2015-10-08 DIAGNOSIS — E860 Dehydration: Secondary | ICD-10-CM | POA: Diagnosis present

## 2015-10-08 DIAGNOSIS — Z885 Allergy status to narcotic agent status: Secondary | ICD-10-CM

## 2015-10-08 DIAGNOSIS — F0390 Unspecified dementia without behavioral disturbance: Secondary | ICD-10-CM | POA: Diagnosis present

## 2015-10-08 DIAGNOSIS — E871 Hypo-osmolality and hyponatremia: Secondary | ICD-10-CM | POA: Diagnosis present

## 2015-10-08 DIAGNOSIS — N179 Acute kidney failure, unspecified: Secondary | ICD-10-CM | POA: Diagnosis present

## 2015-10-08 DIAGNOSIS — Z91048 Other nonmedicinal substance allergy status: Secondary | ICD-10-CM

## 2015-10-08 DIAGNOSIS — R1313 Dysphagia, pharyngeal phase: Secondary | ICD-10-CM | POA: Diagnosis present

## 2015-10-08 DIAGNOSIS — Z8249 Family history of ischemic heart disease and other diseases of the circulatory system: Secondary | ICD-10-CM

## 2015-10-08 DIAGNOSIS — Z7982 Long term (current) use of aspirin: Secondary | ICD-10-CM

## 2015-10-08 DIAGNOSIS — I10 Essential (primary) hypertension: Secondary | ICD-10-CM | POA: Diagnosis present

## 2015-10-08 DIAGNOSIS — Y95 Nosocomial condition: Secondary | ICD-10-CM | POA: Diagnosis present

## 2015-10-08 DIAGNOSIS — Z91040 Latex allergy status: Secondary | ICD-10-CM

## 2015-10-08 DIAGNOSIS — Z792 Long term (current) use of antibiotics: Secondary | ICD-10-CM

## 2015-10-08 DIAGNOSIS — Z888 Allergy status to other drugs, medicaments and biological substances status: Secondary | ICD-10-CM

## 2015-10-08 DIAGNOSIS — G4733 Obstructive sleep apnea (adult) (pediatric): Secondary | ICD-10-CM | POA: Diagnosis present

## 2015-10-08 DIAGNOSIS — R079 Chest pain, unspecified: Secondary | ICD-10-CM

## 2015-10-08 DIAGNOSIS — Z9989 Dependence on other enabling machines and devices: Secondary | ICD-10-CM

## 2015-10-08 DIAGNOSIS — Z808 Family history of malignant neoplasm of other organs or systems: Secondary | ICD-10-CM

## 2015-10-08 DIAGNOSIS — Z79899 Other long term (current) drug therapy: Secondary | ICD-10-CM

## 2015-10-08 DIAGNOSIS — J189 Pneumonia, unspecified organism: Secondary | ICD-10-CM | POA: Diagnosis present

## 2015-10-08 DIAGNOSIS — R000 Tachycardia, unspecified: Secondary | ICD-10-CM | POA: Diagnosis present

## 2015-10-08 DIAGNOSIS — G40909 Epilepsy, unspecified, not intractable, without status epilepticus: Secondary | ICD-10-CM

## 2015-10-08 DIAGNOSIS — R5381 Other malaise: Secondary | ICD-10-CM | POA: Diagnosis not present

## 2015-10-08 DIAGNOSIS — Z85828 Personal history of other malignant neoplasm of skin: Secondary | ICD-10-CM

## 2015-10-08 DIAGNOSIS — R531 Weakness: Secondary | ICD-10-CM

## 2015-10-08 LAB — COMPREHENSIVE METABOLIC PANEL
ALBUMIN: 3.2 g/dL — AB (ref 3.5–5.0)
ALK PHOS: 65 U/L (ref 38–126)
ALT: 24 U/L (ref 14–54)
AST: 28 U/L (ref 15–41)
Anion gap: 9 (ref 5–15)
BILIRUBIN TOTAL: 0.7 mg/dL (ref 0.3–1.2)
BUN: 34 mg/dL — AB (ref 6–20)
CO2: 17 mmol/L — ABNORMAL LOW (ref 22–32)
CREATININE: 1.3 mg/dL — AB (ref 0.44–1.00)
Calcium: 9 mg/dL (ref 8.9–10.3)
Chloride: 105 mmol/L (ref 101–111)
GFR calc Af Amer: 43 mL/min — ABNORMAL LOW (ref >60)
GFR, EST NON AFRICAN AMERICAN: 37 mL/min — AB (ref >60)
GLUCOSE: 94 mg/dL (ref 65–99)
POTASSIUM: 4.1 mmol/L (ref 3.5–5.1)
Sodium: 131 mmol/L — ABNORMAL LOW (ref 135–145)
TOTAL PROTEIN: 6 g/dL — AB (ref 6.5–8.1)

## 2015-10-08 LAB — CBC
HEMATOCRIT: 32.8 % — AB (ref 36.0–46.0)
Hemoglobin: 11 g/dL — ABNORMAL LOW (ref 12.0–15.0)
MCH: 30.2 pg (ref 26.0–34.0)
MCHC: 33.5 g/dL (ref 30.0–36.0)
MCV: 90.1 fL (ref 78.0–100.0)
Platelets: 141 10*3/uL — ABNORMAL LOW (ref 150–400)
RBC: 3.64 MIL/uL — ABNORMAL LOW (ref 3.87–5.11)
RDW: 13.1 % (ref 11.5–15.5)
WBC: 9.1 10*3/uL (ref 4.0–10.5)

## 2015-10-08 LAB — PHENYTOIN LEVEL, TOTAL

## 2015-10-08 LAB — I-STAT TROPONIN, ED: TROPONIN I, POC: 0.01 ng/mL (ref 0.00–0.08)

## 2015-10-08 LAB — BRAIN NATRIURETIC PEPTIDE: B Natriuretic Peptide: 41 pg/mL (ref 0.0–100.0)

## 2015-10-08 MED ORDER — DEXTROSE 5 % IV SOLN
500.0000 mg | Freq: Once | INTRAVENOUS | Status: AC
  Administered 2015-10-08: 500 mg via INTRAVENOUS
  Filled 2015-10-08: qty 500

## 2015-10-08 MED ORDER — DEXTROSE 5 % IV SOLN
1.0000 g | Freq: Once | INTRAVENOUS | Status: AC
  Administered 2015-10-08: 1 g via INTRAVENOUS
  Filled 2015-10-08: qty 10

## 2015-10-08 MED ORDER — AZITHROMYCIN 250 MG PO TABS
500.0000 mg | ORAL_TABLET | Freq: Once | ORAL | Status: DC
  Filled 2015-10-08: qty 2

## 2015-10-08 MED ORDER — SODIUM CHLORIDE 0.9 % IV BOLUS (SEPSIS)
500.0000 mL | Freq: Once | INTRAVENOUS | Status: AC
  Administered 2015-10-08: 500 mL via INTRAVENOUS

## 2015-10-08 NOTE — ED Provider Notes (Signed)
CSN: [id number]     Arrival date & time 10/08/15  1711 History  By signing my name below, I, Eustaquio Maize, attest that this documentation has been prepared under the direction and in the presence of Dorie Rank, MD. Electronically Signed: Eustaquio Maize, ED Scribe. 10/08/2015. 5:50 PM.   Chief Complaint  Patient presents with  . Chest Pain   Patient is a 82 y.o. female presenting with chest pain. The history is provided by the patient. No language interpreter was used.  Chest Pain Pain location:  Substernal area Pain radiates to:  Does not radiate Pain radiates to the back: no   Pain severity:  Moderate Onset quality:  Gradual Duration:  3 days Timing:  Constant Progression:  Unchanged Chronicity:  New Ineffective treatments: antibiotics. Associated symptoms: cough and fever     HPI Comments: [name] is a 82 y.o. female brought in by ambulance, who presents to the Emergency Department complaining of gradual onset, constant, central chest pain x 3 days. The chest pain is exacerbated with coughing. Pt was seen in the ED on 10/05/2015 for chest pain and had a CXR done with findings of pneumonia. Pt was prescribed Levaquin and discharged home. She returns today for unchanged pain despite being complaint with the antibiotics. She has not been taking anything for the pain.Pt also complains of fever. Her temperature in the ED is 97.5.  Denies any other associated symptoms.   Past Medical History  Diagnosis Date  . Allergic rhinitis   . Skin cancer   . Hypertension   . OSA (obstructive sleep apnea)   . Irregular heart rate   . Abdominal pain, other specified site    Past Surgical History  Procedure Laterality Date  . Back surgery    . Cholecystectomy    . Total abdominal hysterectomy     Family History  Problem Relation Age of Onset  . Heart disease Father   . Skin cancer Mother   . Skin cancer Father    Social History  Substance Use Topics  . Smoking status: Never  Smoker   . Smokeless tobacco: Never Used  . Alcohol Use: No   OB History    No data available     Review of Systems  Constitutional: Positive for fever.  Respiratory: Positive for cough.   Cardiovascular: Positive for chest pain.  All other systems reviewed and are negative.     Allergies  Codeine; Latex; and Tape  Home Medications   Prior to Admission medications   Medication Sig Start Date End Date Taking? Authorizing Provider  amLODipine (NORVASC) 2.5 MG tablet Take 2.5 mg by mouth daily.    Historical Provider, MD  amoxicillin-clavulanate (AUGMENTIN) 875-125 MG tablet Take 1 tablet by mouth 2 (two) times daily. 10/03/15   Belkys A Regalado, MD  aspirin 81 MG tablet Take 81 mg by mouth every other day.     Historical Provider, MD  Calcium-Vitamin D 600-125 MG-UNIT TABS Take 1 tablet by mouth daily.    Historical Provider, MD  cholecalciferol (VITAMIN D) 1000 UNITS tablet Take 1,000 Units by mouth daily.    Historical Provider, MD  Coenzyme Q10 60 MG TABS Take 1 tablet by mouth daily.    Historical Provider, MD  doxycycline (VIBRAMYCIN) 100 MG capsule Take 100 mg by mouth 2 (two) times daily.    Historical Provider, MD  fexofenadine (ALLEGRA) 180 MG tablet Take 180 mg by mouth daily.    Historical Provider, MD  folic acid (FOLVITE)  1 MG tablet Take 1 mg by mouth daily.    Historical Provider, MD  levofloxacin (LEVAQUIN) 750 MG tablet Take 1 tablet (750 mg total) by mouth daily. 10/06/15   Orpah Greek, MD  lisinopril (PRINIVIL,ZESTRIL) 20 MG tablet Take 20 mg by mouth daily.    Historical Provider, MD  NIFEdipine (PROCARDIA-XL/ADALAT-CC/NIFEDICAL-XL) 30 MG 24 hr tablet Take 30 mg by mouth daily 08/13/15   Historical Provider, MD  nitroGLYCERIN (NITROSTAT) 0.4 MG SL tablet Place 1 tablet (0.4 mg total) under the tongue every 5 (five) minutes as needed for chest pain. 10/03/15   Belkys A Regalado, MD  Omega-3 Fatty Acids (FISH OIL) 1000 MG CAPS Take 2 capsules by mouth  daily.    Historical Provider, MD  oxybutynin (DITROPAN) 5 MG tablet Take 5 mg by mouth daily.    Historical Provider, MD  pantoprazole (PROTONIX) 40 MG tablet Take 1 tablet (40 mg total) by mouth daily. 10/03/15   Belkys A Regalado, MD  phenytoin (DILANTIN) 100 MG ER capsule Take 200 mg by mouth at bedtime.     Historical Provider, MD  sodium chloride (OCEAN) 0.65 % SOLN nasal spray Place 1 spray into both nostrils as needed for congestion.    Historical Provider, MD   BP 127/62 mmHg  Pulse 104  Temp(Src) 97.5 F (36.4 C) (Oral)  Resp 22  SpO2 98%   Physical Exam  Constitutional: No distress.  Frail, elderly  HENT:  Head: Normocephalic and atraumatic.  Right Ear: External ear normal.  Left Ear: External ear normal.  Eyes: Conjunctivae are normal. Right eye exhibits no discharge. Left eye exhibits no discharge. No scleral icterus.  Neck: Neck supple. No tracheal deviation present.  Cardiovascular: Normal rate, regular rhythm and intact distal pulses.   Pulmonary/Chest: Effort normal and breath sounds normal. No stridor. No respiratory distress. She has no wheezes. She has no rales.  Abdominal: Soft. Bowel sounds are normal. She exhibits no distension. There is no tenderness. There is no rebound and no guarding.  Musculoskeletal: She exhibits no edema or tenderness.  Neurological: She is alert. She has normal strength. No cranial nerve deficit (no facial droop, extraocular movements intact, no slurred speech) or sensory deficit. She exhibits normal muscle tone. She displays no seizure activity. Coordination normal.  Skin: Skin is warm and dry. No rash noted.  Psychiatric: She has a normal mood and affect.  Nursing note and vitals reviewed.   ED Course  Procedures (including critical care time)  DIAGNOSTIC STUDIES: Oxygen Saturation is 99% on RA, normal by my interpretation.    COORDINATION OF CARE: 5:49 PM-Discussed treatment plan with pt at bedside and pt agreed to plan.    Labs Review Labs Reviewed  COMPREHENSIVE METABOLIC PANEL - Abnormal; Notable for the following:    Sodium 131 (*)    CO2 17 (*)    BUN 34 (*)    Creatinine, Ser 1.30 (*)    Total Protein 6.0 (*)    Albumin 3.2 (*)    GFR calc non Af Amer 37 (*)    GFR calc Af Amer 43 (*)    All other components within normal limits  CBC - Abnormal; Notable for the following:    RBC 3.64 (*)    Hemoglobin 11.0 (*)    HCT 32.8 (*)    Platelets 141 (*)    All other components within normal limits  BRAIN NATRIURETIC PEPTIDE  I-STAT TROPOININ, ED  Randolm Idol, ED    Imaging Review Dg  Chest 2 View  10/08/2015  CLINICAL DATA:  Central chest pain for 3 days. Pain worse with coughing. Recent diagnosis of pneumonia. EXAM: CHEST  2 VIEW COMPARISON:  Radiographs and CT 3 days prior. FINDINGS: Improved bibasilar aeration from prior exam. No new focal consolidation. Heart size and mediastinal contours are unchanged. No pulmonary edema, pleural effusion or pneumothorax. No acute osseous abnormality. IMPRESSION: Improved bibasilar aeration with decreased bibasilar opacities. No new abnormality is seen. Electronically Signed   By: Jeb Levering M.D.   On: 10/08/2015 18:47    Reviewed CXR results from 10/01/2015 and 10/05/2015 as well at CT Chest from 10/06/2015.   I have personally reviewed and evaluated these images and lab results as part of my medical decision-making.   EKG Interpretation   Date/Time:  Friday October 08 2015 17:18:55 EDT Ventricular Rate:  102 PR Interval:  151 QRS Duration: 86 QT Interval:  363 QTC Calculation: 473 R Axis:   6 Text Interpretation:  Sinus tachycardia Probable anteroseptal infarct, old  No significant change since last tracing Confirmed by Cyanna Neace  MD-J, Clotee Schlicker  [id number]) on 10/08/2015 5:35:16 PM      MDM   Final diagnoses:  Community acquired pneumonia  Weakness   Pt feels like she is getting weaker.  Has not been able to feed herself according to her pastor  and friend.  Pt is not sleeping well because of having to watch her husband.  Suspect there is a anxiety/stress component to her illness however she is more tachycardic.  CT that was done the previous day suggested PNA.  Will consult with medical service for IV abx and fluids.   I personally performed the services described in this documentation, which was scribed in my presence.  The recorded information has been reviewed and is accurate.      Dorie Rank, MD 10/09/15 [phone]

## 2015-10-08 NOTE — ED Notes (Signed)
Attempted report 

## 2015-10-08 NOTE — ED Notes (Signed)
Pt arrives from home via [facility] for cp x3 days. Ems reports pt was recently diagnosed with pneumonia. Pt received 324mg  asa and 1 sl nitro.

## 2015-10-08 NOTE — Clinical Social Work Note (Signed)
Clinical Social Work Assessment  Patient Details  Name: [name] MRN: [id number] Date of Birth: 16-Dec-1929  Date of referral:  10/08/15               Reason for consult:  Discharge Planning, Facility Placement, Other (Comment Required) (pt is sole caregiver to her husband who has dementia)                Permission sought to share information with:  Other, Family Supports (pastor ted burke ) Permission granted to share information::     Name::        Agency::     Relationship::     Contact Information:     Housing/Transportation Living arrangements for the past 2 months:  [place] of Information:  Other (Comment Required) Administrator, Civil Service) Patient Interpreter Needed:  None Criminal Activity/Legal Involvement Pertinent to Current Situation/Hospitalization:  No - Comment as needed Significant Relationships:  Adult Children, [place], Spouse Lives with:  Spouse Do you feel safe going back to the place where you live?  No Need for family participation in patient care:  Yes (Comment)  Care giving concerns: CSW met with pt and her pastor.  Pt appeared to be shivering uncontrollably, and unable to whisper more than a few words at a time.  She is the caregiver to her husband who has dementia, but has had increased difficulty in caring for him secondary to her state of health.  Pt's pastor [name] [phone]) has been able to arrange care for pt's husband for a few days, but maintains that he ultimately needs placed in a facility.  It is also likely that pt will need rehab following this admission, as well.  Pt has a son [name]) who lives in [place], but is currently in the [place] and won't be back in the states until next Friday, 6/23.  Per [name], pt's son is not reachable by phone, but can receive/send text messages.  [name] was not able to provide insight into the couple's finances, nor what they could afford in the way of care for pt's husband: ALF respite until  more permanent plans could be arranged vs home with privately hired caregivers.  CSW did not press pt for financial information during visit as is appeared very ill/uncomfortable.   Social Worker assessment / plan: CSW will follow along for disposition recommendations for pt and facilitate arrangements as appropriate.  CSW also available to provide guidance/resources for pt's husband's friends/family as they work to secure care for him.  This is a difficult situation as pt's son is unavailable and pt is unable to arrange for care for husband in her current state.  Family/friends are limited as they are unable to make financial arrangements on behalf of pt/husband. Employment status:  Retired Forensic scientist:  Managed Care PT Recommendations:  Not assessed at this time Information / Referral to community resources:  Other (Comment Required) (pt's husband may require alf placement)  Patient/Family's Response to care: Unable to determine secondary to pt's clinical state.  Pt's pastor appreciative of CSW visit and recommendations for care for pt's husband. Patient/Family's Understanding of and Emotional Response to Diagnosis, Current Treatment, and Prognosis: Pt wanting to go home and care for her husband.  She is unable to focus on getting the care she needs, because she is worried about her husband. Emotional Assessment Appearance:  Appears stated age Attitude/Demeanor/Rapport:  Guarded Affect (typically observed):  Overwhelmed Orientation:  Oriented to Self, Oriented to Place,  Oriented to Situation, Oriented to  Time Alcohol / Substance use:  Not Applicable Psych involvement (Current and /or in the community):  No (Comment)  Discharge Needs  Concerns to be addressed:  Discharge Planning Concerns Readmission within the last 30 days:  Yes Current discharge risk:  Other Barriers to Discharge:  No Barriers Identified   Roanna Raider, LCSW 10/08/2015, 10:58 PM

## 2015-10-08 NOTE — H&P (Addendum)
[name] [id number] DOB: Jan 21, 1930 DOA: 10/08/2015     PCP: Phineas Inches, MD   Outpatient Specialists: Pulmonology Young Patient coming from:  home Lives alone With family    Chief Complaint: cough  HPI: [name] is a 82 y.o. female with medical history significant of sleep apnea on CPAP  Seizure disorder, hypertension,  Presented with worsening fatigue and cough chest pain She was recently admitted for chest pain which felt to be secondary to esophagitis troponin was negative 3 echogram showed normal EF normal wall motion abnormalities she was diagnosed with bacterial sinusitis and treated with Augmentin. She had PT OT evaluation was recommended SNF but patient has been has refused bu husband of note he has severe dementia.  Came back again on June 14 complaining of chest pain CT and Job chest showed no evidence of PE but did show evidence of pneumonia she was started on Levaquin discharged to home. Despite this she continued to feel worse has been having chest pain worse with coughing she reports fever. So weak cannot even walk or feed self. Patient stopped eating and drinking so that she doesn't have to get up to use the bathroom.    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 Patient has been has issues with dementia. She has known history of seizure disorder on Dilantin    IN ER: Afebrile heart rate up to 104 respirations 23 blood pressure 05/18/1951  WBC 9.1 hemoglobin 11 platelets 141 potassium 131 which is around her baseline bicarbonate 17 creatinine 1.3 which is up from baseline of 0.98 X-ray was repeated today showing improvement   Hospitalist was called for admission for HCAP And dehydration  Review of Systems:    Pertinent positives include:  Fevers, chills, fatigue,  shortness of breath at rest dyspnea on exertion,  chest pain, productive cough, Constitutional:  No weight loss, night sweats weight loss  HEENT:  No  headaches, Difficulty swallowing,Tooth/dental problems,Sore throat,  No sneezing, itching, ear ache, nasal congestion, post nasal drip,  Cardio-vascular:  No Orthopnea, PND, anasarca, dizziness, palpitations.no Bilateral lower extremity swelling  GI:  No heartburn, indigestion, abdominal pain, nausea, vomiting, diarrhea, change in bowel habits, loss of appetite, melena, blood in stool, hematemesis Resp:   No excess mucus, no  No non-productive cough, No coughing up of blood.No change in color of mucus.No wheezing. Skin:  no rash or lesions. No jaundice GU:  no dysuria, change in color of urine, no urgency or frequency. No straining to urinate.  No flank pain.  Musculoskeletal:  No joint pain or no joint swelling. No decreased range of motion. No back pain.  Psych:  No change in mood or affect. No depression or anxiety. No memory loss.  Neuro: no localizing neurological complaints, no tingling, no weakness, no double vision, no gait abnormality, no slurred speech, no confusion  As per HPI otherwise 10 point review of systems negative.   Past Medical History: Past Medical History  Diagnosis Date  . Allergic rhinitis   . Skin cancer   . Hypertension   . OSA (obstructive sleep apnea)   . Irregular heart rate   . Abdominal pain, other specified site    Past Surgical History  Procedure Laterality Date  . Back surgery    . Cholecystectomy    . Total abdominal hysterectomy       Social History:  Ambulatory  independently      reports that she has never smoked. She  has never used smokeless tobacco. She reports that she does not drink alcohol or use illicit drugs.  Allergies:   Allergies  Allergen Reactions  . Codeine Hypertension    Causes a sensation of "pressure" within her head  . Latex Itching  . Tape Itching       Family History:    Family History  Problem Relation Age of Onset  . Heart disease Father   . Skin cancer Mother   . Skin cancer Father      Medications: Prior to Admission medications   Medication Sig Start Date End Date Taking? Authorizing Provider  amLODipine (NORVASC) 2.5 MG tablet Take 2.5 mg by mouth daily.   Yes Historical Provider, MD  amoxicillin-clavulanate (AUGMENTIN) 875-125 MG tablet Take 1 tablet by mouth 2 (two) times daily. 10/03/15  Yes Belkys A Regalado, MD  aspirin 81 MG tablet Take 81 mg by mouth every other day.    Yes Historical Provider, MD  Calcium-Vitamin D 600-125 MG-UNIT TABS Take 1 tablet by mouth daily.   Yes Historical Provider, MD  Coenzyme Q10 60 MG TABS Take 1 tablet by mouth daily.   Yes Historical Provider, MD  fexofenadine (ALLEGRA) 180 MG tablet Take 180 mg by mouth daily.   Yes Historical Provider, MD  folic acid (FOLVITE) 1 MG tablet Take 1 mg by mouth daily.   Yes Historical Provider, MD  lisinopril (PRINIVIL,ZESTRIL) 20 MG tablet Take 20 mg by mouth daily.   Yes Historical Provider, MD  NIFEdipine (PROCARDIA-XL/ADALAT-CC/NIFEDICAL-XL) 30 MG 24 hr tablet Take 30 mg by mouth daily 08/13/15  Yes Historical Provider, MD  nitroGLYCERIN (NITROSTAT) 0.4 MG SL tablet Place 1 tablet (0.4 mg total) under the tongue every 5 (five) minutes as needed for chest pain. 10/03/15  Yes Belkys A Regalado, MD  Omega-3 Fatty Acids (FISH OIL) 1000 MG CAPS Take 1 capsule by mouth daily.    Yes Historical Provider, MD  oxybutynin (DITROPAN) 5 MG tablet Take 5 mg by mouth daily.   Yes Historical Provider, MD  phenytoin (DILANTIN) 100 MG ER capsule Take 200 mg by mouth at bedtime.    Yes Historical Provider, MD  levofloxacin (LEVAQUIN) 750 MG tablet Take 1 tablet (750 mg total) by mouth daily. 10/06/15   Orpah Greek, MD  pantoprazole (PROTONIX) 40 MG tablet Take 1 tablet (40 mg total) by mouth daily. 10/03/15   Elmarie Shiley, MD    Physical Exam: Patient Vitals for the past 24 hrs:  BP Temp Temp src Pulse Resp SpO2  10/08/15 1945 125/55 mmHg - - 98 19 98 %  10/08/15 1930 128/56 mmHg - - 96 23 97 %   10/08/15 1900 127/62 mmHg - - 104 22 98 %  10/08/15 1845 (!) 118/52 mmHg - - 95 15 98 %  10/08/15 1832 141/70 mmHg - - 98 - 98 %  10/08/15 1800 130/62 mmHg - - 99 - 98 %  10/08/15 1745 136/70 mmHg - - 98 17 98 %  10/08/15 1730 145/66 mmHg - - 99 - 98 %  10/08/15 1725 141/72 mmHg 97.5 F (36.4 C) Oral 103 20 99 %    1. General:  in No Acute distress 2. Psychological: Alert and  Oriented 3. Head/ENT:     Dry Mucous Membranes                          Head Non traumatic, neck supple  Poor Dentition 4. SKIN:  decreased Skin turgor,  Skin clean Dry and intact no rash 5. Heart: Regular rate and rhythm no   Murmur, Rub or gallop 6. Lungs:   no wheezes or crackles   7. Abdomen: Soft, non-tender, Non distended 8. Lower extremities: no clubbing, cyanosis, or edema 9. Neurologically Grossly intact, moving all 4 extremities equally 10. MSK: Normal range of motion   body mass index is unknown because there is no weight on file.  Labs on Admission:   Labs on Admission: I have personally reviewed following labs and imaging studies  CBC:  Recent Labs Lab 10/03/15 0516 10/05/15 2249 10/08/15 1747  WBC 6.9 8.6 9.1  HGB 11.3* 10.7* 11.0*  HCT 32.7* 32.4* 32.8*  MCV 89.8 91.5 90.1  PLT 156 152 [id number]*   Basic Metabolic Panel:  Recent Labs Lab 10/03/15 0516 10/05/15 2249 10/08/15 1747  NA 132* 132* 131*  K 4.0 4.1 4.1  CL 103 104 105  CO2 22 21* 17*  GLUCOSE 108* 137* 94  BUN 21* 33* 34*  CREATININE 0.81 0.98 1.30*  CALCIUM 8.8* 9.0 9.0   GFR: Estimated Creatinine Clearance: 29.7 mL/min (by C-G formula based on Cr of 1.3). Liver Function Tests:  Recent Labs Lab 10/02/15 1139 10/05/15 2249 10/08/15 1747  AST  --  23 28  ALT  --  25 24  ALKPHOS  --  71 65  BILITOT  --  0.5 0.7  PROT  --  6.1* 6.0*  ALBUMIN 3.7 3.3* 3.2*   No results for input(s): LIPASE, AMYLASE in the last 168 hours. No results for input(s): AMMONIA in the last 168  hours. Coagulation Profile:  Recent Labs Lab 10/05/15 2249  INR 1.28   Cardiac Enzymes:  Recent Labs Lab 10/01/15 2312 10/02/15 0530 10/02/15 1139  TROPONINI <0.03 <0.03 <0.03   BNP (last 3 results) No results for input(s): PROBNP in the last 8760 hours. HbA1C: No results for input(s): HGBA1C in the last 72 hours. CBG: No results for input(s): GLUCAP in the last 168 hours. Lipid Profile: No results for input(s): CHOL, HDL, LDLCALC, TRIG, CHOLHDL, LDLDIRECT in the last 72 hours. Thyroid Function Tests: No results for input(s): TSH, T4TOTAL, FREET4, T3FREE, THYROIDAB in the last 72 hours. Anemia Panel: No results for input(s): VITAMINB12, FOLATE, FERRITIN, TIBC, IRON, RETICCTPCT in the last 72 hours. Urine analysis: No results found for: COLORURINE, APPEARANCEUR, LABSPEC, PHURINE, GLUCOSEU, HGBUR, BILIRUBINUR, KETONESUR, PROTEINUR, UROBILINOGEN, NITRITE, LEUKOCYTESUR Sepsis Labs: @LABRCNTIP (procalcitonin:4,lacticidven:4) )No results found for this or any previous visit (from the past 240 hour(s)).   UA ordered  No results found for: HGBA1C  Estimated Creatinine Clearance: 29.7 mL/min (by C-G formula based on Cr of 1.3).  BNP (last 3 results) No results for input(s): PROBNP in the last 8760 hours.   ECG REPORT  Independently reviewed Rate: 102  Rhythm: sinus tachycardia ST&T Change: No acute ischemic changes   QTC 472  There were no vitals filed for this visit.   Cultures: No results found for: SDES, [place], CULT, REPTSTATUS   Radiological Exams on Admission: Dg Chest 2 View  10/08/2015  CLINICAL DATA:  Central chest pain for 3 days. Pain worse with coughing. Recent diagnosis of pneumonia. EXAM: CHEST  2 VIEW COMPARISON:  Radiographs and CT 3 days prior. FINDINGS: Improved bibasilar aeration from prior exam. No new focal consolidation. Heart size and mediastinal contours are unchanged. No pulmonary edema, pleural effusion or pneumothorax. No acute osseous  abnormality. IMPRESSION: Improved bibasilar aeration with decreased  bibasilar opacities. No new abnormality is seen. Electronically Signed   By: Jeb Levering M.D.   On: 10/08/2015 18:47    Chart has been reviewed    Assessment/Plan  82 y.o. female with medical history significant of sleep apnea on CPAP,  Seizure disorder, hypertension, here with HCAP dehydration and debility  Present on Admission:  . HCAP (healthcare-associated pneumonia)   - will admit for treatment of HCAP will start on appropriate antibiotic coverage.   Obtain sputum cultures, blood cultures if febrile or if decompensates.  Provide oxygen as needed.  . Hypertension - hold lisinopril given acute renal failure allow some permissive hypertension  . Hyponatremia likely secondary to dehydration will give IV fluids check urine electrolytes  . OSA on CPAP will order  . Debility - OT/PT eval patient likely will need nursing home placement her husband has significant history of dementia and unable to make decisions for her Seizure disorder continue Dilantin and check levels  . Chest pain - patient has had recent workup chest pain appears to be musculoskeletal secondary to repeated coughing  . Dehydration administer IV fluids Other plan as per orders.  DVT prophylaxis:   Lovenox     Code Status:  FULL CODE   as per patient    Family Communication:   Family not  at  Bedside    Disposition Plan:   likely will need placement for rehabilitation                           Consults called: social work  Admission status:    Inpatient     Level of care    tele     [place] 10/08/2015, 9:54 PM    [facility]  Pager [phone]   after 2 AM please page floor coverage PA If 7AM-7PM, please contact the day team taking care of the patient  [email or web address]  Password [email or web address]

## 2015-10-09 LAB — COMPREHENSIVE METABOLIC PANEL
ALBUMIN: 2.9 g/dL — AB (ref 3.5–5.0)
ALT: 22 U/L (ref 14–54)
AST: 22 U/L (ref 15–41)
Alkaline Phosphatase: 64 U/L (ref 38–126)
Anion gap: 8 (ref 5–15)
BILIRUBIN TOTAL: 0.6 mg/dL (ref 0.3–1.2)
BUN: 25 mg/dL — AB (ref 6–20)
CO2: 20 mmol/L — AB (ref 22–32)
Calcium: 8.7 mg/dL — ABNORMAL LOW (ref 8.9–10.3)
Chloride: 107 mmol/L (ref 101–111)
Creatinine, Ser: 1.1 mg/dL — ABNORMAL HIGH (ref 0.44–1.00)
GFR calc Af Amer: 52 mL/min — ABNORMAL LOW (ref >60)
GFR calc non Af Amer: 45 mL/min — ABNORMAL LOW (ref >60)
GLUCOSE: 122 mg/dL — AB (ref 65–99)
POTASSIUM: 3.7 mmol/L (ref 3.5–5.1)
SODIUM: 135 mmol/L (ref 135–145)
TOTAL PROTEIN: 5.4 g/dL — AB (ref 6.5–8.1)

## 2015-10-09 LAB — URINALYSIS, ROUTINE W REFLEX MICROSCOPIC
Bilirubin Urine: NEGATIVE
GLUCOSE, UA: NEGATIVE mg/dL
KETONES UR: 15 mg/dL — AB
NITRITE: NEGATIVE
PH: 5 (ref 5.0–8.0)
Protein, ur: NEGATIVE mg/dL
SPECIFIC GRAVITY, URINE: 1.015 (ref 1.005–1.030)

## 2015-10-09 LAB — CBC WITH DIFFERENTIAL/PLATELET
BASOS ABS: 0 10*3/uL (ref 0.0–0.1)
BASOS PCT: 0 %
Eosinophils Absolute: 0.2 10*3/uL (ref 0.0–0.7)
Eosinophils Relative: 2 %
HEMATOCRIT: 32.5 % — AB (ref 36.0–46.0)
HEMOGLOBIN: 10.8 g/dL — AB (ref 12.0–15.0)
LYMPHS ABS: 0.9 10*3/uL (ref 0.7–4.0)
LYMPHS PCT: 10 %
MCH: 30.2 pg (ref 26.0–34.0)
MCHC: 33.2 g/dL (ref 30.0–36.0)
MCV: 90.8 fL (ref 78.0–100.0)
Monocytes Absolute: 0.8 10*3/uL (ref 0.1–1.0)
Monocytes Relative: 9 %
Neutro Abs: 7.2 10*3/uL (ref 1.7–7.7)
Neutrophils Relative %: 79 %
Platelets: 128 10*3/uL — ABNORMAL LOW (ref 150–400)
RBC: 3.58 MIL/uL — AB (ref 3.87–5.11)
RDW: 13.1 % (ref 11.5–15.5)
WBC: 9 10*3/uL (ref 4.0–10.5)

## 2015-10-09 LAB — CREATININE, URINE, RANDOM: Creatinine, Urine: 31.48 mg/dL

## 2015-10-09 LAB — URINE MICROSCOPIC-ADD ON

## 2015-10-09 LAB — GLUCOSE, CAPILLARY: GLUCOSE-CAPILLARY: 97 mg/dL (ref 65–99)

## 2015-10-09 LAB — STREP PNEUMONIAE URINARY ANTIGEN: Strep Pneumo Urinary Antigen: NEGATIVE

## 2015-10-09 LAB — OSMOLALITY, URINE: Osmolality, Ur: 373 mOsm/kg (ref 300–900)

## 2015-10-09 LAB — TSH: TSH: 3.741 u[IU]/mL (ref 0.350–4.500)

## 2015-10-09 LAB — SODIUM, URINE, RANDOM: SODIUM UR: 96 mmol/L

## 2015-10-09 MED ORDER — PHENYTOIN SODIUM EXTENDED 100 MG PO CAPS
200.0000 mg | ORAL_CAPSULE | Freq: Every day | ORAL | Status: DC
  Administered 2015-10-09 – 2015-10-10 (×3): 200 mg via ORAL
  Filled 2015-10-09 (×4): qty 2

## 2015-10-09 MED ORDER — OXYBUTYNIN CHLORIDE 5 MG PO TABS
5.0000 mg | ORAL_TABLET | Freq: Every day | ORAL | Status: DC
  Administered 2015-10-09 – 2015-10-11 (×3): 5 mg via ORAL
  Filled 2015-10-09 (×3): qty 1

## 2015-10-09 MED ORDER — VANCOMYCIN HCL IN DEXTROSE 750-5 MG/150ML-% IV SOLN
750.0000 mg | Freq: Once | INTRAVENOUS | Status: AC
  Administered 2015-10-09: 750 mg via INTRAVENOUS
  Filled 2015-10-09: qty 150

## 2015-10-09 MED ORDER — LORAZEPAM 2 MG/ML IJ SOLN: 2.0000 mg | INTRAMUSCULAR | Status: DC | PRN

## 2015-10-09 MED ORDER — SODIUM CHLORIDE 0.9 % IV SOLN
200.0000 mg | Freq: Once | INTRAVENOUS | Status: AC
  Administered 2015-10-09: 200 mg via INTRAVENOUS
  Filled 2015-10-09: qty 4

## 2015-10-09 MED ORDER — ENOXAPARIN SODIUM 40 MG/0.4ML ~~LOC~~ SOLN
40.0000 mg | SUBCUTANEOUS | Status: DC
  Administered 2015-10-10 – 2015-10-11 (×2): 40 mg via SUBCUTANEOUS
  Filled 2015-10-09 (×2): qty 0.4

## 2015-10-09 MED ORDER — DEXTROSE 5 % IV SOLN
1.0000 g | INTRAVENOUS | Status: DC
  Administered 2015-10-09 – 2015-10-11 (×3): 1 g via INTRAVENOUS
  Filled 2015-10-09 (×4): qty 1

## 2015-10-09 MED ORDER — ASPIRIN 81 MG PO CHEW
81.0000 mg | CHEWABLE_TABLET | ORAL | Status: DC
  Administered 2015-10-09 – 2015-10-11 (×2): 81 mg via ORAL
  Filled 2015-10-09 (×2): qty 1

## 2015-10-09 MED ORDER — FOLIC ACID 1 MG PO TABS
1.0000 mg | ORAL_TABLET | Freq: Every day | ORAL | Status: DC
  Administered 2015-10-09 – 2015-10-11 (×3): 1 mg via ORAL
  Filled 2015-10-09 (×3): qty 1

## 2015-10-09 MED ORDER — VANCOMYCIN HCL IN DEXTROSE 750-5 MG/150ML-% IV SOLN: 750.0000 mg | INTRAVENOUS | Status: DC

## 2015-10-09 MED ORDER — LORATADINE 10 MG PO TABS
10.0000 mg | ORAL_TABLET | Freq: Every day | ORAL | Status: DC
  Administered 2015-10-09 – 2015-10-11 (×3): 10 mg via ORAL
  Filled 2015-10-09 (×3): qty 1

## 2015-10-09 MED ORDER — PANTOPRAZOLE SODIUM 40 MG PO TBEC
40.0000 mg | DELAYED_RELEASE_TABLET | Freq: Every day | ORAL | Status: DC
  Administered 2015-10-09 – 2015-10-11 (×3): 40 mg via ORAL
  Filled 2015-10-09 (×3): qty 1

## 2015-10-09 MED ORDER — SODIUM CHLORIDE 0.9 % IV SOLN
INTRAVENOUS | Status: AC
  Administered 2015-10-09 (×2): via INTRAVENOUS

## 2015-10-09 MED ORDER — ENOXAPARIN SODIUM 30 MG/0.3ML ~~LOC~~ SOLN
30.0000 mg | Freq: Every day | SUBCUTANEOUS | Status: DC
  Administered 2015-10-09: 30 mg via SUBCUTANEOUS
  Filled 2015-10-09: qty 0.3

## 2015-10-09 MED ORDER — CEFEPIME HCL 1 G IJ SOLR: 1.0000 g | Freq: Three times a day (TID) | INTRAMUSCULAR | Status: DC

## 2015-10-09 MED ORDER — VANCOMYCIN HCL IN DEXTROSE 1-5 GM/200ML-% IV SOLN
1000.0000 mg | INTRAVENOUS | Status: DC
  Administered 2015-10-10 – 2015-10-11 (×2): 1000 mg via INTRAVENOUS
  Filled 2015-10-09 (×3): qty 200

## 2015-10-09 MED ORDER — AMLODIPINE BESYLATE 2.5 MG PO TABS
2.5000 mg | ORAL_TABLET | Freq: Every day | ORAL | Status: DC
  Administered 2015-10-09 – 2015-10-11 (×3): 2.5 mg via ORAL
  Filled 2015-10-09 (×3): qty 1

## 2015-10-09 NOTE — Progress Notes (Signed)
Pharmacy Antibiotic Note  [name] is a 82 y.o. female admitted on 10/08/2015 with pneumonia.  Pharmacy has been consulted for vancomycin and cefepime dosing.  Plan: Vancomycin 750mg  IV every 24 hours.  Goal trough 15-20 mcg/mL.  Change cefepime to 1g IV every 24 hours.  Height: 5\' 2"  (157.5 cm) Weight: 143 lb 4.8 oz (65 kg) IBW/kg (Calculated) : 50.1  Temp (24hrs), Avg:97.9 F (36.6 C), Min:97.5 F (36.4 C), Max:98.3 F (36.8 C)   Recent Labs Lab 10/03/15 0516 10/05/15 2249 10/08/15 1747  WBC 6.9 8.6 9.1  CREATININE 0.81 0.98 1.30*    Estimated Creatinine Clearance: 29 mL/min (by C-G formula based on Cr of 1.3).    Allergies  Allergen Reactions  . Codeine Hypertension    Causes a sensation of "pressure" within her head  . Latex Itching  . Tape Itching     Thank you for allowing pharmacy to be a part of this patient's care.  Wynona Neat, PharmD, BCPS  10/09/2015 12:10 AM

## 2015-10-09 NOTE — H&P (Addendum)
[facility] PROGRESS NOTE  [name] [id number] DOB: May 28, 1929 DOA: 10/08/2015 PCP: Phineas Inches, MD  Assessment/Plan: 82 y.o. female with medical history significant of sleep apnea on CPAP, Seizure disorder, hypertension, here with HCAP dehydration and debility  Present on Admission:  . HCAP (healthcare-associated pneumonia)[place] organism -  treatment of HCAP w/ vanc and maxipime. Pending sputum cultures, blood cultures if febrile or if decompensates. Provide oxygen as needed.  . Hypertension - hold lisinopril given acute renal failure allow some permissive hypertension  . Hyponatremia likely secondary to dehydration will give IV fluids check urine electrolytes  . OSA on CPAP ordered  . Debility - OT/PT eval pending Seizure disorder continue Dilantin and checked level was low.Ativan when necessary seizure seizure precautions. . Chest pain - patient has had recent workup chest pain appears to be musculoskeletal secondary to repeated coughing  . Dehydration administer IV fluids Other plan as per orders.  DVT prophylaxis: Lovenox   Code Status: FULL CODE as per patient   Family Communication: Family not at Bedside   Disposition Plan: likely will need placement for rehabilitation    Consults called: social work  Admission status: Inpatient    Level of care tele    HPI/Subjective: Patient with no improvement in fatigue. Will get PT OT. Per nursing no overnight events.  Objective: Filed Vitals:   10/09/15 0623 10/09/15 1044  BP: 138/46 146/68  Pulse: 96 99  Temp: 98.1 F (36.7 C) 98.3 F (36.8 C)  Resp: 17 16    Intake/Output Summary (Last 24 hours) at 10/09/15 1210 Last data filed at 10/09/15 0914  Gross per 24 hour  Intake     50 ml  Output    400 ml  Net   -350 ml   Filed Weights   10/08/15 2340  Weight: 65 kg (143 lb 4.8 oz)    Exam:  General:  No diaphoresis, anxious, no acute  distress  Cardiovascular: Regular rate and rhythm no murmurs rubs or gallops  Respiratory: Clear to auscultation bilaterally no more breathing  Abdomen: Nondistended bowel sounds normal nontender palpation  Musculoskeletal: Moving all extremities, no deformity, 5 out of 5 strength   Data Reviewed: Basic Metabolic Panel:  Recent Labs Lab 10/03/15 0516 10/05/15 2249 10/08/15 1747 10/09/15 0405  NA 132* 132* 131* 135  K 4.0 4.1 4.1 3.7  CL 103 104 105 107  CO2 22 21* 17* 20*  GLUCOSE 108* 137* 94 122*  BUN 21* 33* 34* 25*  CREATININE 0.81 0.98 1.30* 1.10*  CALCIUM 8.8* 9.0 9.0 8.7*   Liver Function Tests:  Recent Labs Lab 10/05/15 2249 10/08/15 1747 10/09/15 0405  AST 23 28 22   ALT 25 24 22   ALKPHOS 71 65 64  BILITOT 0.5 0.7 0.6  PROT 6.1* 6.0* 5.4*  ALBUMIN 3.3* 3.2* 2.9*   No results for input(s): LIPASE, AMYLASE in the last 168 hours. No results for input(s): AMMONIA in the last 168 hours. CBC:  Recent Labs Lab 10/03/15 0516 10/05/15 2249 10/08/15 1747 10/09/15 0405  WBC 6.9 8.6 9.1 9.0  NEUTROABS  --   --   --  7.2  HGB 11.3* 10.7* 11.0* 10.8*  HCT 32.7* 32.4* 32.8* 32.5*  MCV 89.8 91.5 90.1 90.8  PLT 156 152 141* 128*   Cardiac Enzymes: No results for input(s): CKTOTAL, CKMB, CKMBINDEX, TROPONINI in the last 168 hours. BNP (last 3 results)  Recent Labs  10/08/15 1755  BNP 41.0    ProBNP (last 3 results) No results  for input(s): PROBNP in the last 8760 hours.  CBG:  Recent Labs Lab 10/09/15 0237  GLUCAP 97    No results found for this or any previous visit (from the past 240 hour(s)).   Studies: Dg Chest 2 View  10/08/2015  CLINICAL DATA:  Central chest pain for 3 days. Pain worse with coughing. Recent diagnosis of pneumonia. EXAM: CHEST  2 VIEW COMPARISON:  Radiographs and CT 3 days prior. FINDINGS: Improved bibasilar aeration from prior exam. No new focal consolidation. Heart size and mediastinal contours are unchanged. No  pulmonary edema, pleural effusion or pneumothorax. No acute osseous abnormality. IMPRESSION: Improved bibasilar aeration with decreased bibasilar opacities. No new abnormality is seen. Electronically Signed   By: Jeb Levering M.D.   On: 10/08/2015 18:47    Scheduled Meds: . amLODipine  2.5 mg Oral Daily  . aspirin  81 mg Oral QODAY  . ceFEPime (MAXIPIME) IV  1 g Intravenous Q24H  . enoxaparin (LOVENOX) injection  30 mg Subcutaneous QHS  . folic acid  1 mg Oral Daily  . loratadine  10 mg Oral Daily  . oxybutynin  5 mg Oral Daily  . pantoprazole  40 mg Oral Daily  . phenytoin  200 mg Oral QHS  . [START ON 10/10/2015] vancomycin  1,000 mg Intravenous Q24H   Continuous Infusions: . sodium chloride 100 mL/hr at 10/09/15 [id number]    Active Problems:   Hypertension   Chest pain   Hyponatremia   OSA on CPAP   Seizure disorder ([place])   CAP (community acquired pneumonia)   HCAP (healthcare-associated pneumonia)   Debility   Dehydration    Time spent: [facility] Pager [phone]. If 7PM-7AM, please contact night-coverage at [email or web address], password [facility] 10/09/2015, 12:10 PM  LOS: 1 day

## 2015-10-09 NOTE — Progress Notes (Signed)
Pt was admitted from ED PER Gwynn, self introduced to pt , id bracelet checked, fall prevention plan discussed with pt , she has been oriented to the equipment, admission package given, call light and phone within reach and pt able to demonstrate how to use it, treatment started will continue to monitor

## 2015-10-09 NOTE — [place] FL2 (Signed)
[place] MEDICAID FL2 LEVEL OF CARE SCREENING TOOL     IDENTIFICATION  Patient Name: [name] Birthdate: 1929-07-13 Sex: female Admission Date (Current Location): 10/08/2015  [facility] and [place] Number:  Herbalist and Address:  [facility] [place], [place] [address] [place], [place] [address]      Provider Number: [id number]  Attending Physician Name and Address:  Elwin Mocha, MD  Relative Name and Phone Number:       Current Level of Care: Hospital Recommended Level of Care: [place] Prior Approval Number:    Date Approved/Denied:   PASRR Number: [id number]  Discharge Plan: SNF    Current Diagnoses: Patient Active Problem List   Diagnosis Date Noted  . CAP (community acquired pneumonia) 10/08/2015  . HCAP (healthcare-associated pneumonia) 10/08/2015  . Debility 10/08/2015  . Dehydration 10/08/2015  . Chest pain 10/01/2015  . Hyponatremia 10/01/2015  . Acute bacterial sinusitis 10/01/2015  . OSA on CPAP 10/01/2015  . Seizure disorder ([place]) 10/01/2015  . 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 Hypertension 05/30/2010  . IRREGULAR HEART RATE 05/30/2010  . ALLERGIC RHINITIS 05/30/2010  . SKIN CANCER, HX OF 05/30/2010  . ABDOMINAL PAIN OTHER SPECIFIED SITE 11/10/2008    Orientation RESPIRATION BLADDER Height & Weight     Self, Time, Situation, Place  Normal Incontinent Weight: 65 kg (143 lb 4.8 oz) Height:  5\' 2"  (157.5 cm)  BEHAVIORAL SYMPTOMS/MOOD NEUROLOGICAL BOWEL NUTRITION STATUS      Continent Diet (Please see DC Summary)  AMBULATORY STATUS COMMUNICATION OF NEEDS Skin   Extensive Assist Verbally Normal                       Personal Care Assistance Level of Assistance  Bathing, Feeding, Dressing Bathing Assistance: Maximum assistance Feeding assistance: Independent Dressing Assistance: Maximum assistance     Functional Limitations Info             SPECIAL CARE FACTORS FREQUENCY   PT (By licensed PT)     PT Frequency: min 3x/week              Contractures      Additional Factors Info  Code Status, Allergies Code Status Info: Full Allergies Info: Codeine, Latex, Tape           Current Medications (10/09/2015):  This is the current hospital active medication list Current Facility-Administered Medications  Medication Dose Route Frequency Provider Last Rate Last Dose  . 0.9 %  sodium chloride infusion   Intravenous Continuous Elwin Mocha, MD 100 mL/hr at 10/09/15 1354    . amLODipine (NORVASC) tablet 2.5 mg  2.5 mg Oral Daily Toy Baker, MD   2.5 mg at 10/09/15 1043  . aspirin chewable tablet 81 mg  81 mg Oral QODAY Toy Baker, MD   81 mg at 10/09/15 1044  . ceFEPIme (MAXIPIME) 1 g in dextrose 5 % 50 mL IVPB  1 g Intravenous Q24H Laren Everts, RPH   1 g at 10/09/15 0103  . enoxaparin (LOVENOX) injection 30 mg  30 mg Subcutaneous QHS Toy Baker, MD   30 mg at 10/09/15 0051  . folic acid (FOLVITE) tablet 1 mg  1 mg Oral Daily Toy Baker, MD   1 mg at 10/09/15 1044  . loratadine (CLARITIN) tablet 10 mg  10 mg Oral Daily Toy Baker, MD   10 mg at 10/09/15 1044  . LORazepam (ATIVAN) injection 2 mg  2  mg Intravenous Q4H PRN Elwin Mocha, MD      . oxybutynin [facility]) tablet 5 mg  5 mg Oral Daily Toy Baker, MD   5 mg at 10/09/15 1044  . pantoprazole (PROTONIX) EC tablet 40 mg  40 mg Oral Daily Toy Baker, MD   40 mg at 10/09/15 1042  . phenytoin (DILANTIN) ER capsule 200 mg  200 mg Oral QHS Toy Baker, MD   200 mg at 10/09/15 0051  . [START ON 10/10/2015] vancomycin (VANCOCIN) IVPB 1000 mg/200 mL premix  1,000 mg Intravenous Q24H Rolla Flatten, [facility]         Discharge Medications: Please see discharge summary for a list of discharge medications.  Relevant Imaging Results:  Relevant Lab Results:   Additional Information SSN: [place], [place]

## 2015-10-09 NOTE — Evaluation (Signed)
Clinical/Bedside Swallow Evaluation Patient Details  Name: [name] MRN: [id number] Date of Birth: 1930/01/25  Today's Date: 10/09/2015 Time: SLP Start Time (ACUTE ONLY): 1328 SLP Stop Time (ACUTE ONLY): 1345 SLP Time Calculation (min) (ACUTE ONLY): 17 min  Past Medical History:  Past Medical History  Diagnosis Date  . Allergic rhinitis   . Skin cancer   . Hypertension   . OSA (obstructive sleep apnea)   . Irregular heart rate   . Abdominal pain, other specified site    Past Surgical History:  Past Surgical History  Procedure Laterality Date  . Back surgery    . Cholecystectomy    . Total abdominal hysterectomy     HPI:  82 y.o. female with medical history significant of sleep apnea on CPAP, Seizure disorder, hypertension, here with HCAP dehydration and debility. Pt has chest pain due to repeated coughing.   No history of SLP treatment in chart.    Assessment / Plan / Recommendation Clinical Impression  Pt demonstrates consistent soft cough after each sip of thin liquids despite bolus size. SLP provided cueing for more forceful cough, still with poor plosive strength. Recommend pt have objective assessment of her swallowing prior to d/c as SLP suspects pt may have inadequate airway protection with thin liquids. Pt agreeable to testing. Pt may continue current diet until then as intake has been minimal.     Aspiration Risk  Moderate aspiration risk    Diet Recommendation Regular;Thin liquid   Liquid Administration via: Cup Medication Administration: Whole meds with puree Supervision: Staff to assist with self feeding Compensations: Small sips/bites;Slow rate Postural Changes: Seated upright at 90 degrees    Other  Recommendations Oral Care Recommendations: Oral care BID   Follow up Recommendations       Frequency and Duration            Prognosis        Swallow Study   General HPI: 82 y.o. female with medical history significant of sleep apnea on  CPAP, Seizure disorder, hypertension, here with HCAP dehydration and debility. Pt has chest pain due to repeated coughing.   No history of SLP treatment in chart.  Type of Study: Bedside Swallow Evaluation Previous Swallow Assessment: none Diet Prior to this Study: Regular;Thin liquids Temperature Spikes Noted: No Respiratory Status: Room air History of Recent Intubation: No Behavior/Cognition: Alert;Cooperative;Pleasant mood Oral Cavity Assessment: Within Functional Limits Oral Care Completed by SLP: No Oral Cavity - Dentition: Adequate natural dentition Vision: Functional for self-feeding Self-Feeding Abilities: Able to feed self Patient Positioning: Upright in chair Baseline Vocal Quality: Normal Volitional Cough: Weak Volitional Swallow: Able to elicit    Oral/Motor/Sensory Function Overall Oral Motor/Sensory Function: Within functional limits   Ice Chips     Thin Liquid Thin Liquid: Impaired Presentation: Cup;Straw;Self Fed Pharyngeal  Phase Impairments: Cough - Immediate    Nectar Thick Nectar Thick Liquid: Not tested   Honey Thick Honey Thick Liquid: Not tested   Puree Puree: Within functional limits   Solid   GO   Solid: Within functional limits       [facility], MA CCC-SLP [id number]  [name], Katherene Ponto 10/09/2015,1:54 PM

## 2015-10-09 NOTE — Clinical Social Work Placement (Signed)
   CLINICAL SOCIAL WORK PLACEMENT  NOTE  Date:  10/09/2015  Patient Details  Name: [name] MRN: [id number] Date of Birth: 1929-05-16  Clinical Social Work is seeking post-discharge placement for this patient at the [place] level of care (*CSW will initial, date and re-position this form in  chart as items are completed):      Patient/family provided with [place] Work Department's list of facilities offering this level of care within the geographic area requested by the patient (or if unable, by the patient's family).      Patient/family informed of their freedom to choose among providers that offer the needed level of care, that participate in Medicare, Medicaid or managed care program needed by the patient, have an available bed and are willing to accept the patient.      Patient/family informed of [facility]'s ownership interest in [facility] and [facility], as well as of the fact that they are under no obligation to receive care at these facilities.  PASRR submitted to EDS on 10/09/15     PASRR number received on 10/09/15     Existing PASRR number confirmed on       FL2 transmitted to all facilities in geographic area requested by pt/family on 10/09/15     FL2 transmitted to all facilities within larger geographic area on       Patient informed that his/her managed care company has contracts with or will negotiate with certain facilities, including the following:            Patient/family informed of bed offers received.  Patient chooses bed at       Physician recommends and patient chooses bed at      Patient to be transferred to   on  .  Patient to be transferred to facility by       Patient family notified on   of transfer.  Name of family member notified:        PHYSICIAN Please sign FL2     Additional Comment:    _______________________________________________ Benard Halsted, [place] 10/09/2015, 4:08  PM

## 2015-10-09 NOTE — Progress Notes (Signed)
RT placed patient on CPAP auto 1max and 46min. Patient tolerating well. RT will continue to monitor.

## 2015-10-09 NOTE — Progress Notes (Signed)
Pharmacy Antibiotic Note  [name] is a 82 y.o. female admitted on 10/08/2015 with worsening fatigue, cough, and CP. It is noted that the patient had a recent admit earlier in June for CP, CE neg x3, neg for PE - recently treated outpatient for sinusitis with Augmentin and was recently given Rx for LVQ to treat PNA however had yet to start. Pharmacy was consulted for Vancomycin + Cefepime dosing for r/o HCAP.  Given the patient's improvement in SCr/renal function - will adjust the dose of Vancomycin today. Cefepime remains appropriate.   Goal of Therapy:  Vancomycin trough of 15-20 mcg/ml  Plan: 1. Adjust Vancomycin to 1g IV every 24 hours 2. Continue Cefepime 1g IV every 24 hours 3. Will continue to follow renal function, culture results, LOT, and antibiotic de-escalation plans   Height: 5\' 2"  (157.5 cm) Weight: 143 lb 4.8 oz (65 kg) IBW/kg (Calculated) : 50.1  Temp (24hrs), Avg:98.1 F (36.7 C), Min:97.5 F (36.4 C), Max:98.3 F (36.8 C)   Recent Labs Lab 10/03/15 0516 10/05/15 2249 10/08/15 1747 10/09/15 0405  WBC 6.9 8.6 9.1 9.0  CREATININE 0.81 0.98 1.30* 1.10*    Estimated Creatinine Clearance: 34.3 mL/min (by C-G formula based on Cr of 1.1).    Allergies  Allergen Reactions  . Codeine Hypertension    Causes a sensation of "pressure" within her head  . Latex Itching  . Tape Itching    Antimicrobials this admission: Augmentin PTA 6/11 >> 6/15 Vanc 6/17 >> * 750 mg x 1 dose on 6/17@ 0100 CTX 6/16 x 1 dose Cefepime 6/17 >>  Dose adjustments this admission: 6/17: Vanc adjusted to 1g/24h for improving renal fxn  Microbiology results: 6/17 UCx >> 6/17 RCx >>  Thank you for allowing pharmacy to be a part of this patient's care.  Alycia Rossetti, PharmD, BCPS Clinical Pharmacist Pager: [phone] 10/09/2015 11:09 AM

## 2015-10-09 NOTE — Procedures (Signed)
Placed patient on CPAP auto 5-15cm H20 for the night.  Patient is tolerating well at this time.

## 2015-10-09 NOTE — Evaluation (Signed)
Physical Therapy Evaluation Patient Details Name: [name] MRN: [id number] DOB: [birth year] Today's Date: 10/09/2015   History of Present Illness  Pt is a 82  y/o F admitted on 10/08/15 w/ pneumonia.  She was recently admitted w/ chest pain and was diagnosed w/ bacterial sinusitis.  Pt's PMH includes skin cancer, back surgery.     Clinical Impression  Pt admitted with above diagnosis. Pt currently with functional limitations due to the deficits listed below (see PT Problem List). [name] presents w/ generalized weakness, impaired balance, and is a high fall risk.  She currently requires up min assist for transfers and very short distance ambulation in room w/ HR up to 131.  Had discussion w/ pt about dispo plan.  She is in agreement that she will need assist at home upon d/c but is concerned about the financial implications of going to a SNF.  Pt agreeable to discuss options w/ SW. If pt refuses SNF she will need HH services maxed out to provide 24/7 assist.  Pt will benefit from skilled PT to increase their independence and safety with mobility to allow discharge to the venue listed below.      Follow Up Recommendations SNF;Supervision/Assistance - 24 hour    Equipment Recommendations       Recommendations for Other Services OT consult     Precautions / Restrictions Precautions Precautions: Fall Precaution Comments: monitor HR Restrictions Weight Bearing Restrictions: No      Mobility  Bed Mobility Overal bed mobility: Needs Assistance Bed Mobility: Supine to Sit     Supine to sit: Min assist;HOB elevated     General bed mobility comments: Pt requires max increased time and assist to advance Bil LEs to EOB and elevate trunk.  Pt uses bed rail to pull up to sitting w/ assist provided to elevate trunk.  Transfers Overall transfer level: Needs assistance Equipment used: Rolling walker (2 wheeled) Transfers: Sit to/from Stand Sit to Stand: Min assist;+2 physical  assistance         General transfer comment: Assist to boost up to standing and to steady.  Cues for hand placement.  Posterior lean w/ transfer.  Ambulation/Gait Ambulation/Gait assistance: Min assist Ambulation Distance (Feet): 5 Feet Assistive device: Rolling walker (2 wheeled) Gait Pattern/deviations: Shuffle;Trunk flexed   Gait velocity interpretation: <1.8 ft/sec, indicative of risk for recurrent falls General Gait Details: Pt shuffles her feet w/ dec gait speed and flexed posture.  Cues for upright posture and assist to steady as pt very tremulous.  HR up to 131.  Stairs            Wheelchair Mobility    Modified Rankin (Stroke Patients Only)       Balance Overall balance assessment: Needs assistance Sitting-balance support: Bilateral upper extremity supported;Feet supported Sitting balance-Leahy Scale: Poor Sitting balance - Comments: Close min guard assist and pt requires UE support sitting EOB   Standing balance support: Bilateral upper extremity supported;During functional activity Standing balance-Leahy Scale: Poor Standing balance comment: Relies on UE support                             Pertinent Vitals/Pain Pain Assessment: Faces Faces Pain Scale: Hurts little more Pain Location: Rt knee, chest Pain Descriptors / Indicators: Tightness;Aching Pain Intervention(s): Monitored during session;Limited activity within patient's tolerance;Repositioned    Home Living Family/patient expects to be discharged to:: Private residence Living Arrangements: Spouse/significant other Available Help at Discharge: Other (  Comment) (sister in law able to provide limited assist) Type of Home: House Home Access: Stairs to enter Entrance Stairs-Rails: None Entrance Stairs-Number of Steps: 1 Home Layout: One level Home Equipment: Walker - 2 wheels;Cane - single point;Bedside commode Additional Comments: 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  Denies needing assist for bathing, dressing over the past week.     Hand Dominance        Extremity/Trunk Assessment   Upper Extremity Assessment: Generalized weakness           Lower Extremity Assessment: Generalized weakness      Cervical / Trunk Assessment: Kyphotic  Communication   Communication: No difficulties  Cognition Arousal/Alertness: Awake/alert Behavior During Therapy: Flat affect Overall Cognitive Status: Within Functional Limits for tasks assessed                      General Comments General comments (skin integrity, edema, etc.): Had discussion w/ pt about dispo plan.  She is in agreement that she will need assist at home upon d/c but is concerned about the financial implications of going to a SNF.  Pt agreeable to discuss options w/ SW.    Exercises General Exercises - Lower Extremity Ankle Circles/Pumps: AROM;Both;10 reps;Seated      Assessment/Plan    PT Assessment Patient needs continued PT services  PT Diagnosis Difficulty walking;Generalized weakness   PT Problem List Decreased strength;Decreased activity tolerance;Decreased balance;Decreased mobility;Decreased knowledge of use of DME;Decreased safety awareness;Cardiopulmonary status limiting activity;Pain  PT Treatment Interventions DME instruction;Gait training;Stair training;Functional mobility training;Therapeutic activities;Therapeutic exercise;Balance training;Patient/family education   PT Goals (Current goals can be found in the Care Plan section) Acute Rehab PT Goals Patient Stated Goal: to be able to go home PT Goal Formulation: With patient Time For Goal Achievement: 10/23/15 Potential to Achieve Goals: Good    Frequency Min 3X/week    Barriers to discharge Inaccessible home environment;Decreased caregiver support No assist available at home and has 1 step to enter home    Co-evaluation               End of Session Equipment Utilized During Treatment: Gait belt Activity Tolerance: Patient limited by fatigue Patient left: in chair;with call bell/phone within reach;with chair alarm set;with family/visitor present Nurse Communication: Mobility status;Other (comment) (HR)         Time: [id number] PT Time Calculation (min) (ACUTE ONLY): 28 min   Charges:   PT Evaluation $PT Eval Low Complexity: 1 Procedure PT Treatments $Therapeutic Activity: 8-22 mins   PT G Codes:       Collie Siad PT, DPT  Pager: [phone] Phone: [phone] 10/09/2015, 10:01 AM

## 2015-10-09 NOTE — Progress Notes (Signed)
Ex-pastor and his wife are at bedside with patient's husband (with dementia). They are trying to arrange for someone to stay with husband while patient is in hospital. CSW and RN explained that patient's husband cannot stay at hospital without someone with him due to liability. CSW provided food vouchers to patient's husband for the cafeteria. Patient expressed agreement to go to SNF until her son gets back in the country. She prefers [facility] or [facility]. CSW to continue to follow for needs.  Percell Locus Joeangel Jeanpaul LCSWA [phone]

## 2015-10-10 ENCOUNTER — Inpatient Hospital Stay (HOSPITAL_COMMUNITY): Payer: Medicare Other

## 2015-10-10 DIAGNOSIS — E860 Dehydration: Secondary | ICD-10-CM

## 2015-10-10 DIAGNOSIS — J189 Pneumonia, unspecified organism: Principal | ICD-10-CM

## 2015-10-10 LAB — CBC WITH DIFFERENTIAL/PLATELET
BASOS ABS: 0 10*3/uL (ref 0.0–0.1)
Basophils Relative: 0 %
Eosinophils Absolute: 0.3 10*3/uL (ref 0.0–0.7)
Eosinophils Relative: 3 %
HEMATOCRIT: 38 % (ref 36.0–46.0)
HEMOGLOBIN: 12.5 g/dL (ref 12.0–15.0)
LYMPHS PCT: 16 %
Lymphs Abs: 1.5 10*3/uL (ref 0.7–4.0)
MCH: 30.3 pg (ref 26.0–34.0)
MCHC: 32.9 g/dL (ref 30.0–36.0)
MCV: 92.2 fL (ref 78.0–100.0)
MONO ABS: 0.6 10*3/uL (ref 0.1–1.0)
Monocytes Relative: 6 %
NEUTROS ABS: 7.1 10*3/uL (ref 1.7–7.7)
NEUTROS PCT: 75 %
PLATELETS: 147 10*3/uL — AB (ref 150–400)
RBC: 4.12 MIL/uL (ref 3.87–5.11)
RDW: 13.4 % (ref 11.5–15.5)
WBC: 9.4 10*3/uL (ref 4.0–10.5)

## 2015-10-10 LAB — BASIC METABOLIC PANEL
ANION GAP: 9 (ref 5–15)
BUN: 11 mg/dL (ref 6–20)
CALCIUM: 8.8 mg/dL — AB (ref 8.9–10.3)
CO2: 22 mmol/L (ref 22–32)
Chloride: 103 mmol/L (ref 101–111)
Creatinine, Ser: 0.85 mg/dL (ref 0.44–1.00)
Glucose, Bld: 146 mg/dL — ABNORMAL HIGH (ref 65–99)
POTASSIUM: 3.4 mmol/L — AB (ref 3.5–5.1)
SODIUM: 134 mmol/L — AB (ref 135–145)

## 2015-10-10 LAB — URINE CULTURE: Culture: 30000 — AB

## 2015-10-10 MED ORDER — ACETAMINOPHEN 325 MG PO TABS
650.0000 mg | ORAL_TABLET | Freq: Four times a day (QID) | ORAL | Status: DC | PRN
  Administered 2015-10-10: 650 mg via ORAL
  Filled 2015-10-10: qty 2

## 2015-10-10 MED ORDER — RESOURCE THICKENUP CLEAR PO POWD
ORAL | Status: DC | PRN
  Filled 2015-10-10: qty 125

## 2015-10-10 NOTE — Progress Notes (Signed)
[facility] PROGRESS NOTE  [name] [id number] DOB: 08/17/1929 DOA: 10/08/2015 PCP: Phineas Inches, MD  Assessment/Plan: 82 y.o. female with medical history significant of sleep apnea on CPAP, Seizure disorder, hypertension, here with HCAP dehydration and debility. Doing well. Fever curve in right direction.  Present on Admission:  . HCAP (healthcare-associated pneumonia) -  treatment of HCAP w/ vanc and maxipime. Pending sputum cultures, blood cultures if febrile or if decompensates. Provide oxygen as needed.  . Hypertension - hold lisinopril given acute renal failure allow some permissive hypertension  . Hyponatremia resolved  . OSA on CPAP ordered  . Debility - OT/PT eval SNF or home PT Seizure disorder continue Dilantin and checked level was low.Ativan when necessary seizure seizure precautions. . Chest pain - patient has had recent workup chest pain appears to be musculoskeletal secondary to repeated coughing  . Dehydration administered IV fluids Other plan as per orders.  - Diff Swallowing - SLP eval appreciated, placed on dyphagia diet & pt educated by SLP  DVT prophylaxis: Lovenox   Code Status: FULL CODE as per patient   Family Communication: Family not at Bedside   Disposition Plan: likely will need placement for rehabilitation    Consults called: social work  Admission status: Inpatient    Level of care tele    HPI/Subjective: Patient with no complaints. States she understands new diet guidelines. Denies worsening SOB and CP.  Objective: Filed Vitals:   10/10/15 1432 10/10/15 2107  BP: 143/50 136/48  Pulse: 94 94  Temp: 98.4 F (36.9 C) 98.3 F (36.8 C)  Resp: 18 18    Intake/Output Summary (Last 24 hours) at 10/10/15 2127 Last data filed at 10/10/15 1440  Gross per 24 hour  Intake    650 ml  Output    150 ml  Net    500 ml   Filed Weights   10/08/15 2340  Weight: 65 kg (143  lb 4.8 oz)    Exam:  General:  No diaphoresis, anxious, no acute distress  Cardiovascular: Regular rate and rhythm no murmurs rubs or gallops  Respiratory: Clear to auscultation bilaterally, normal breathing  Abdomen: Nondistended bowel sounds normal nontender palpation  Musculoskeletal: Moving all extremities, no deformity, 5 out of 5 strength   Data Reviewed: Basic Metabolic Panel:  Recent Labs Lab 10/05/15 2249 10/08/15 1747 10/09/15 0405 10/10/15 1037  NA 132* 131* 135 134*  K 4.1 4.1 3.7 3.4*  CL 104 105 107 103  CO2 21* 17* 20* 22  GLUCOSE 137* 94 122* 146*  BUN 33* 34* 25* 11  CREATININE 0.98 1.30* 1.10* 0.85  CALCIUM 9.0 9.0 8.7* 8.8*   Liver Function Tests:  Recent Labs Lab 10/05/15 2249 10/08/15 1747 10/09/15 0405  AST 23 28 22   ALT 25 24 22   ALKPHOS 71 65 64  BILITOT 0.5 0.7 0.6  PROT 6.1* 6.0* 5.4*  ALBUMIN 3.3* 3.2* 2.9*   No results for input(s): LIPASE, AMYLASE in the last 168 hours. No results for input(s): AMMONIA in the last 168 hours. CBC:  Recent Labs Lab 10/05/15 2249 10/08/15 1747 10/09/15 0405 10/10/15 1037  WBC 8.6 9.1 9.0 9.4  NEUTROABS  --   --  7.2 7.1  HGB 10.7* 11.0* 10.8* 12.5  HCT 32.4* 32.8* 32.5* 38.0  MCV 91.5 90.1 90.8 92.2  PLT 152 141* 128* 147*   Cardiac Enzymes: No results for input(s): CKTOTAL, CKMB, CKMBINDEX, TROPONINI in the last 168 hours. BNP (last 3 results)  Recent Labs  10/08/15  1755  BNP 41.0    ProBNP (last 3 results) No results for input(s): PROBNP in the last 8760 hours.  CBG:  Recent Labs Lab 10/09/15 0237  GLUCAP 97    Recent Results (from the past 240 hour(s))  Urine culture     Status: Abnormal   Collection Time: 10/09/15  9:14 AM  Result Value Ref Range Status   Specimen Description URINE, CLEAN CATCH  Final   Special Requests NONE  Final   Culture 30,000 COLONIES/mL YEAST (A)  Final   Report Status 10/10/2015 FINAL  Final     Studies: Dg Swallowing Func-speech  Pathology  10/10/2015  Objective Swallowing Evaluation: Type of Study: MBS-Modified Barium Swallow Study Patient Details Name: [name] MRN: [id number] Date of Birth: 1929/11/23 Today's Date: 10/10/2015 Time: SLP Start Time (ACUTE ONLY): 1307-SLP Stop Time (ACUTE ONLY): 1321 SLP Time Calculation (min) (ACUTE ONLY): 14 min Past Medical History: Past Medical History Diagnosis Date . Allergic rhinitis  . Skin cancer  . Hypertension  . OSA (obstructive sleep apnea)  . Irregular heart rate  . Abdominal pain, other specified site  Past Surgical History: Past Surgical History Procedure Laterality Date . Back surgery   . Cholecystectomy   . Total abdominal hysterectomy   HPI: 82 y.o. female with medical history significant of sleep apnea on CPAP, Seizure disorder, hypertension, here with HCAP dehydration and debility. Pt has chest pain due to repeated coughing.   No history of SLP treatment in chart.  Subjective: pt alert, pleasant Assessment / Plan / Recommendation CHL IP CLINICAL IMPRESSIONS 10/10/2015 Therapy Diagnosis Moderate pharyngeal phase dysphagia Clinical Impression Pt has a moderate pharyngeal dysphagia with primarily motor issues. She has significantly reduced base of tongue retraction, hyolaryngeal movement, epiglottic inversion, and pharyngeal squeeze, resulting in the need for multiple reflexive swallows to clear single boluses. Moderate amounts of residue remain in the valleculae post-swallow as well as some along the posterior pharyngeal wall, with some reduction with cues for multiple swallows. Aspiration occurs during the swallow with thin liquids as pt is working to clear the bolus from her pharynx. Aspiration is sensed but with a very weak throat clear. She cannot produce much more subglottal pressure despite cues for more forceful coughing. Given the above, recommend Dys 2 diet and nectar thick liquids by cup, alternating solids/liquids and utilizing multiple effortful swallows per bolus.  Impact on safety and function Moderate aspiration risk   CHL IP TREATMENT RECOMMENDATION 10/10/2015 Treatment Recommendations Therapy as outlined in treatment plan below   Prognosis 10/10/2015 Prognosis for Safe Diet Advancement Good Barriers to Reach Goals Severity of deficits Barriers/Prognosis Comment -- CHL IP DIET RECOMMENDATION 10/10/2015 SLP Diet Recommendations Dysphagia 2 (Fine chop) solids;Nectar thick liquid Liquid Administration via Cup;No straw Medication Administration Crushed with puree Compensations Slow rate;Small sips/bites Postural Changes Remain semi-upright after after feeds/meals (Comment);Seated upright at 90 degrees   CHL IP OTHER RECOMMENDATIONS 10/10/2015 Recommended Consults -- Oral Care Recommendations Oral care BID Other Recommendations --   CH[facility] IP FOLLOW UP RECOMMENDATIONS 10/10/2015 Follow up Recommendations Skilled Nursing facility   [facility] IP FREQUENCY AND DURATION 10/10/2015 Speech Therapy Frequency (ACUTE ONLY) min 2x/week Treatment Duration 2 weeks      CHL IP ORAL PHASE 10/10/2015 Oral Phase WFL Oral - Pudding Teaspoon -- Oral - Pudding Cup -- Oral - Honey Teaspoon -- Oral - Honey Cup -- Oral - Nectar Teaspoon -- Oral - Nectar Cup -- Oral - Nectar Straw -- Oral - Thin Teaspoon -- Oral - Thin  Cup -- Oral - Thin Straw -- Oral - Puree -- Oral - Mech Soft -- Oral - Regular -- Oral - Multi-Consistency -- Oral - Pill -- Oral Phase - Comment --  CHL IP PHARYNGEAL PHASE 10/10/2015 Pharyngeal Phase Impaired Pharyngeal- Pudding Teaspoon -- Pharyngeal -- Pharyngeal- Pudding Cup -- Pharyngeal -- Pharyngeal- Honey Teaspoon -- Pharyngeal -- Pharyngeal- Honey Cup -- Pharyngeal -- Pharyngeal- Nectar Teaspoon -- Pharyngeal -- Pharyngeal- Nectar Cup Delayed swallow initiation-vallecula;Reduced pharyngeal peristalsis;Reduced epiglottic inversion;Reduced anterior laryngeal mobility;Reduced laryngeal elevation;Reduced tongue base retraction;Pharyngeal residue - valleculae;Pharyngeal residue - posterior pharnyx  Pharyngeal -- Pharyngeal- Nectar Straw -- Pharyngeal -- Pharyngeal- Thin Teaspoon -- Pharyngeal -- Pharyngeal- Thin Cup Delayed swallow initiation-vallecula;Reduced pharyngeal peristalsis;Reduced epiglottic inversion;Reduced anterior laryngeal mobility;Reduced laryngeal elevation;Reduced tongue base retraction;Pharyngeal residue - valleculae;Pharyngeal residue - posterior pharnyx;Penetration/Aspiration during swallow Pharyngeal Material enters airway, passes BELOW cords and not ejected out despite cough attempt by patient Pharyngeal- Thin Straw Delayed swallow initiation-vallecula;Reduced pharyngeal peristalsis;Reduced epiglottic inversion;Reduced anterior laryngeal mobility;Reduced laryngeal elevation;Reduced tongue base retraction;Pharyngeal residue - valleculae;Pharyngeal residue - posterior pharnyx;Penetration/Aspiration during swallow Pharyngeal Material enters airway, passes BELOW cords and not ejected out despite cough attempt by patient Pharyngeal- Puree Delayed swallow initiation-vallecula;Reduced pharyngeal peristalsis;Reduced epiglottic inversion;Reduced anterior laryngeal mobility;Reduced laryngeal elevation;Reduced tongue base retraction;Pharyngeal residue - valleculae;Pharyngeal residue - posterior pharnyx Pharyngeal -- Pharyngeal- Mechanical Soft Delayed swallow initiation-vallecula;Reduced pharyngeal peristalsis;Reduced epiglottic inversion;Reduced anterior laryngeal mobility;Reduced laryngeal elevation;Reduced tongue base retraction;Pharyngeal residue - valleculae;Pharyngeal residue - posterior pharnyx Pharyngeal -- Pharyngeal- Regular -- Pharyngeal -- Pharyngeal- Multi-consistency -- Pharyngeal -- Pharyngeal- Pill -- Pharyngeal -- Pharyngeal Comment --  No flowsheet data found. No flowsheet data found. Germain Osgood, M.A. CCC-SLP [phone] Germain Osgood 10/10/2015, 2:52 PM               Scheduled Meds: . amLODipine  2.5 mg Oral Daily  . aspirin  81 mg Oral QODAY  . ceFEPime  (MAXIPIME) IV  1 g Intravenous Q24H  . enoxaparin (LOVENOX) injection  40 mg Subcutaneous Q24H  . folic acid  1 mg Oral Daily  . loratadine  10 mg Oral Daily  . oxybutynin  5 mg Oral Daily  . pantoprazole  40 mg Oral Daily  . phenytoin  200 mg Oral QHS  . vancomycin  1,000 mg Intravenous Q24H   Continuous Infusions:    Active Problems:   Hypertension   Chest pain   Hyponatremia   OSA on CPAP   Seizure disorder ([place])   CAP (community acquired pneumonia)   HCAP (healthcare-associated pneumonia)   Debility   Dehydration    Time spent: [facility] Pager [phone]. If 7PM-7AM, please contact night-coverage at [email or web address], password [facility] 10/10/2015, 9:27 PM  LOS: 2 days

## 2015-10-11 DIAGNOSIS — R1313 Dysphagia, pharyngeal phase: Secondary | ICD-10-CM | POA: Diagnosis present

## 2015-10-11 LAB — RENAL FUNCTION PANEL
Albumin: 2.7 g/dL — ABNORMAL LOW (ref 3.5–5.0)
Anion gap: 5 (ref 5–15)
BUN: 11 mg/dL (ref 6–20)
CHLORIDE: 104 mmol/L (ref 101–111)
CO2: 22 mmol/L (ref 22–32)
Calcium: 8.5 mg/dL — ABNORMAL LOW (ref 8.9–10.3)
Creatinine, Ser: 0.66 mg/dL (ref 0.44–1.00)
Glucose, Bld: 123 mg/dL — ABNORMAL HIGH (ref 65–99)
POTASSIUM: 3.7 mmol/L (ref 3.5–5.1)
Phosphorus: 2.8 mg/dL (ref 2.5–4.6)
Sodium: 131 mmol/L — ABNORMAL LOW (ref 135–145)

## 2015-10-11 LAB — LEGIONELLA PNEUMOPHILA SEROGP 1 UR AG: L. PNEUMOPHILA SEROGP 1 UR AG: NEGATIVE

## 2015-10-11 LAB — CBC WITH DIFFERENTIAL/PLATELET
BASOS PCT: 0 %
Basophils Absolute: 0 10*3/uL (ref 0.0–0.1)
EOS ABS: 0.5 10*3/uL (ref 0.0–0.7)
EOS PCT: 5 %
HCT: 35.6 % — ABNORMAL LOW (ref 36.0–46.0)
Hemoglobin: 11.8 g/dL — ABNORMAL LOW (ref 12.0–15.0)
LYMPHS ABS: 1.4 10*3/uL (ref 0.7–4.0)
Lymphocytes Relative: 16 %
MCH: 31 pg (ref 26.0–34.0)
MCHC: 33.1 g/dL (ref 30.0–36.0)
MCV: 93.4 fL (ref 78.0–100.0)
MONO ABS: 0.9 10*3/uL (ref 0.1–1.0)
MONOS PCT: 10 %
Neutro Abs: 6.5 10*3/uL (ref 1.7–7.7)
Neutrophils Relative %: 69 %
Platelets: 143 10*3/uL — ABNORMAL LOW (ref 150–400)
RBC: 3.81 MIL/uL — ABNORMAL LOW (ref 3.87–5.11)
RDW: 13.6 % (ref 11.5–15.5)
WBC: 9.3 10*3/uL (ref 4.0–10.5)

## 2015-10-11 MED ORDER — BENZONATATE 100 MG PO CAPS: 100.0000 mg | ORAL_CAPSULE | Freq: Three times a day (TID) | ORAL | Status: DC | PRN

## 2015-10-11 MED ORDER — ALBUTEROL SULFATE HFA 108 (90 BASE) MCG/ACT IN AERS: 2.0000 | INHALATION_SPRAY | RESPIRATORY_TRACT | Status: DC | PRN

## 2015-10-11 MED ORDER — BENZONATATE 100 MG PO CAPS
100.0000 mg | ORAL_CAPSULE | Freq: Three times a day (TID) | ORAL | Status: DC | PRN
Start: 2015-10-11 — End: 2015-10-11

## 2015-10-11 MED ORDER — CEFDINIR 300 MG PO CAPS: 300.0000 mg | ORAL_CAPSULE | Freq: Two times a day (BID) | ORAL | Status: AC

## 2015-10-11 NOTE — Progress Notes (Signed)
Patient will discharge to [facility] Anticipated discharge date: 6/19 Family notified: pastor Transportation by [employer]- called at 2:24pm  [place] signing off.  Domenica Reamer, [place] Social Worker [phone]

## 2015-10-11 NOTE — Progress Notes (Addendum)
NURSING PROGRESS NOTE  LILLE VALDEZ [id number] Discharge Data: 10/11/2015 3:29 PM Attending Provider: No att. providers found [id number] E, MD     Evern Core Treu to be D/C'd [place] per MD order.  Discussed with the patient the After Visit Summary and all questions fully answered. All IV's discontinued with no bleeding noted. All belongings returned to patient for patient to take home.   Last Vital Signs:  Blood pressure 140/62, pulse 93, temperature 98.2 F (36.8 C), temperature source Oral, resp. rate 16, height 5\' 2"  (1.575 m), weight 65.499 kg (144 lb 6.4 oz), SpO2 98 %.  Discharge Medication List   Medication List    STOP taking these medications        amoxicillin-clavulanate 875-125 MG tablet  Commonly known as:  AUGMENTIN     levofloxacin 750 MG tablet  Commonly known as:  LEVAQUIN     NIFEdipine 30 MG 24 hr tablet  Commonly known as:  PROCARDIA-XL/ADALAT-CC/NIFEDICAL-XL      TAKE these medications        albuterol 108 (90 Base) MCG/ACT inhaler  Commonly known as:  PROVENTIL HFA;VENTOLIN HFA  Inhale 2 puffs into the lungs every 4 (four) hours as needed for wheezing or shortness of breath.     amLODipine 2.5 MG tablet  Commonly known as:  NORVASC  Take 2.5 mg by mouth daily.     aspirin 81 MG tablet  Take 81 mg by mouth every other day.     benzonatate 100 MG capsule  Commonly known as:  TESSALON  Take 1 capsule (100 mg total) by mouth 3 (three) times daily as needed for cough.     Calcium-Vitamin D 600-125 MG-UNIT Tabs  Take 1 tablet by mouth daily.     cefdinir 300 MG capsule  Commonly known as:  OMNICEF  Take 1 capsule (300 mg total) by mouth 2 (two) times daily.     Coenzyme Q10 60 MG Tabs  Take 1 tablet by mouth daily.     fexofenadine 180 MG tablet  Commonly known as:  ALLEGRA  Take 180 mg by mouth daily.     Fish Oil 1000 MG Caps  Take 1 capsule by mouth daily.     folic acid 1 MG tablet  Commonly known  as:  FOLVITE  Take 1 mg by mouth daily.     lisinopril 20 MG tablet  Commonly known as:  PRINIVIL,ZESTRIL  Take 20 mg by mouth daily.     nitroGLYCERIN 0.4 MG SL tablet  Commonly known as:  NITROSTAT  Place 1 tablet (0.4 mg total) under the tongue every 5 (five) minutes as needed for chest pain.     oxybutynin 5 MG tablet  Commonly known as:  DITROPAN  Take 5 mg by mouth daily.     pantoprazole 40 MG tablet  Commonly known as:  PROTONIX  Take 1 tablet (40 mg total) by mouth daily.     phenytoin 100 MG ER capsule  Commonly known as:  DILANTIN  Take 200 mg by mouth at bedtime.          Report called and given to Collie Siad, nurse receiving pt at [facility]. Report given at 1530.

## 2015-10-11 NOTE — Progress Notes (Signed)
Speech Language Pathology Treatment: Dysphagia  Patient Details Name: [name] MRN: [id number] DOB: 1929-10-02 Today's Date: 10/11/2015 Time: [id number] SLP Time Calculation (min) (ACUTE ONLY): 13 min  Assessment / Plan / Recommendation Clinical Impression  Skilled treatment session focused on addressing dysphagia goals.  Patient was observed to consume RN administered medications crushed in puree with multiple swallows as well as nectar-thick liquids via cup with use of multiple swallows and no overt s/s of aspiration were noted.  Patient able to recall that her swallow was abnormal, but thought that she could still consume thin liquids as evidenced by thin Coke at the bedside.  SLP reviewed MBS results with patient, implemented an external aid to facilitate recall and encouraged patient to complete Supraglottic Swallows with little improvement in cough strength.  Recommend to continue with current diet restrictions as well as acute SLP follow up.  Anticipate patient will require 24/7 supervision and SLP follow up at next level of care.   HPI HPI: 82 y.o. female with medical history significant of sleep apnea on CPAP, Seizure disorder, hypertension, here with HCAP dehydration and debility. Pt has chest pain due to repeated coughing.   No history of SLP treatment in chart.       SLP Plan  Continue with current plan of care     Recommendations  Diet recommendations: Dysphagia 2 (fine chop);Nectar-thick liquid Liquids provided via: Cup;No straw Medication Administration: Crushed with puree Supervision: Patient able to self feed;Intermittent supervision to cue for compensatory strategies Compensations: Slow rate;Small sips/bites;Multiple dry swallows after each bite/sip;Follow solids with liquid Postural Changes and/or Swallow Maneuvers: Seated upright 90 degrees             Oral Care Recommendations: Oral care BID Follow up Recommendations: Skilled Nursing facility;24 hour  supervision/assistance Plan: Continue with current plan of care     GO               Carmelia Roller., CCC-SLP [id number]  [place] 10/11/2015, 10:56 AM

## 2015-10-11 NOTE — Discharge Instructions (Addendum)
Community-Acquired Pneumonia, Adult °Pneumonia is an infection of the lungs. There are different types of pneumonia. One type can develop while a person is in a hospital. A different type, called community-acquired pneumonia, develops in people who are not, or have not recently been, in the hospital or other health care facility.  °CAUSES °Pneumonia may be caused by bacteria, viruses, or funguses. Community-acquired pneumonia is often caused by Streptococcus pneumonia bacteria. These bacteria are often passed from one person to another by breathing in droplets from the cough or sneeze of an infected person. °RISK FACTORS °The condition is more likely to develop in: °· People who have chronic diseases, such as chronic obstructive pulmonary disease (COPD), asthma, congestive heart failure, cystic fibrosis, diabetes, or kidney disease. °· People who have early-stage or late-stage HIV. °· People who have sickle cell disease. °· People who have had their spleen removed (splenectomy). °· People who have poor dental hygiene. °· People who have medical conditions that increase the risk of breathing in (aspirating) secretions their own mouth and nose.   °· People who have a weakened immune system (immunocompromised). °· People who smoke. °· People who travel to areas where pneumonia-causing germs commonly exist. °· People who are around animal habitats or animals that have pneumonia-causing germs, including birds, bats, rabbits, cats, and farm animals. °SYMPTOMS °Symptoms of this condition include: °· A dry cough. °· A wet (productive) cough. °· Fever. °· Sweating. °· Chest pain, especially when breathing deeply or coughing. °· Rapid breathing or difficulty breathing. °· Shortness of breath. °· Shaking chills. °· Fatigue. °· Muscle aches. °DIAGNOSIS °Your health care provider will take a medical history and perform a physical exam. You may also have other tests, including: °· Imaging studies of your chest, including  X-rays. °· Tests to check your blood oxygen level and other blood gases. °· Other tests on blood, mucus (sputum), fluid around your lungs (pleural fluid), and urine. °If your pneumonia is severe, other tests may be done to identify the specific cause of your illness. °TREATMENT °The type of treatment that you receive depends on many factors, such as the cause of your pneumonia, the medicines you take, and other medical conditions that you have. For most adults, treatment and recovery from pneumonia may occur at home. In some cases, treatment must happen in a hospital. Treatment may include: °· Antibiotic medicines, if the pneumonia was caused by bacteria. °· Antiviral medicines, if the pneumonia was caused by a virus. °· Medicines that are given by mouth or through an IV tube. °· Oxygen. °· Respiratory therapy. °Although rare, treating severe pneumonia may include: °· Mechanical ventilation. This is done if you are not breathing well on your own and you cannot maintain a safe blood oxygen level. °· Thoracentesis. This procedure removes fluid around one lung or both lungs to help you breathe better. °HOME CARE INSTRUCTIONS °· Take over-the-counter and prescription medicines only as told by your health care provider. °¨ Only take cough medicine if you are losing sleep. Understand that cough medicine can prevent your body's natural ability to remove mucus from your lungs. °¨ If you were prescribed an antibiotic medicine, take it as told by your health care provider. Do not stop taking the antibiotic even if you start to feel better. °· Sleep in a semi-upright position at night. Try sleeping in a reclining chair, or place a few pillows under your head. °· Do not use tobacco products, including cigarettes, chewing tobacco, and e-cigarettes. If you need help quitting, ask your health care provider. °· Drink enough water to keep your urine   clear or pale yellow. This will help to thin out mucus secretions in your  lungs. PREVENTION There are ways that you can decrease your risk of developing community-acquired pneumonia. Consider getting a pneumococcal vaccine if:  You are older than 80 years of age.  You are older than 80 years of age and are undergoing cancer treatment, have chronic lung disease, or have other medical conditions that affect your immune system. Ask your health care provider if this applies to you. There are different types and schedules of pneumococcal vaccines. Ask your health care provider which vaccination option is best for you. You may also prevent community-acquired pneumonia if you take these actions:  Get an influenza vaccine every year. Ask your health care provider which type of influenza vaccine is best for you.  Go to the dentist on a regular basis.  Wash your hands often. Use hand sanitizer if soap and water are not available. SEEK MEDICAL CARE IF:  You have a fever.  You are losing sleep because you cannot control your cough with cough medicine. SEEK IMMEDIATE MEDICAL CARE IF:  You have worsening shortness of breath.  You have increased chest pain.  Your sickness becomes worse, especially if you are an older adult or have a weakened immune system.  You cough up blood.   This information is not intended to replace advice given to you by your health care provider. Make sure you discuss any questions you have with your health care provider.   Document Released: 04/10/2005 Document Revised: 12/30/2014 Document Reviewed: 08/05/2014 Elsevier Interactive Patient Education 2016 [place] restarting the nifedipine with your doctor.

## 2015-10-11 NOTE — Care Management Note (Signed)
Case Management Note  Patient Details  Name: [name] MRN: [id number] Date of Birth: 1930-04-08  Subjective/Objective:                 Patient form home with spouse. Admitted with PNA, Will DC to SNF today as facilitated by CSW.    Action/Plan:   Expected Discharge Date:                  Expected Discharge Plan:  Skilled Nursing Facility  In-House Referral:  Clinical Social Work  Discharge planning Services  CM Consult  Post Acute Care Choice:  NA Choice offered to:     DME Arranged:    DME Agency:     HH Arranged:    [place] Agency:     Status of Service:     Medicare Important Message Given:    Date Medicare IM Given:    Medicare IM give by:    Date Additional Medicare IM Given:    Additional Medicare Important Message give by:     If discussed at [place] of Stay Meetings, dates discussed:    Additional Comments:  Carles Collet, RN 10/11/2015, 11:16 AM

## 2015-10-11 NOTE — Clinical Documentation Improvement (Signed)
Hospitalist  Can the diagnosis of pneumonia be further specified?   Type of Pneumonia - CAP, HCAP, Bacterial Pneumonia, Aspiration Pneumonia, Gram Negative Pneumonia, Other Condition, Unable to Clinically Determine  Document causative organism if known  Document any associated diagnoses/conditions such as Respiratory Failure, Sepsis, Underlying Lung Disease (Specify), Other (Specify)  Other condition  Clinically Undetermined   Supporting Information: :   6/118: Speech Language Pathology:swallow evaluation:    Pharyngeal        Delayed swallow initiation-vallecula;Reduced pharyngeal peristalsis;Reduced epiglottic inversion;Reduced anterior laryngeal mobility;Reduced laryngeal elevation;Reduced tongue base retraction;Pharyngeal residue - valleculae;Pharyngeal residue - posterior pharnyx;Penetration/Aspiration during swallow    Material enters airway, passes BELOW cords and not ejected out despite cough attempt by patient        Please exercise your independent, professional judgment when responding. A specific answer is not anticipated or expected.   Thank You, [place] [phone]

## 2015-10-11 NOTE — Progress Notes (Addendum)
Initial Nutrition Assessment  DOCUMENTATION CODES:   Not applicable  INTERVENTION:   -Ensure Enlive po BID, each supplement provides 350 kcal and 20 grams of protein  NUTRITION DIAGNOSIS:   Inadequate oral intake related to poor appetite as evidenced by meal completion < 25%.  GOAL:   Patient will meet greater than or equal to 90% of their needs  MONITOR:   PO intake, Supplement acceptance, Diet advancement, Labs, Weight trends, Skin, I & O's  REASON FOR ASSESSMENT:   Malnutrition Screening Tool    ASSESSMENT:   82 y.o. female with medical history significant of sleep apnea on CPAP, Seizure disorder, hypertension, here with HCAP dehydration and debility  Pt admitted with HCAP/debility.   Spoke with pt and family friend at bedside. Both report a general decline in health over the past month, related to weakness. Pt reports she generally consumes 3 meals per day, however, over the past month has been eating 50-75% less than normal. She was also consuming Ensure supplements once daily PTA.   Pt reports UBW is around 155#. She estimates 5.8% wt loss over the past month, however, no wt hx available to confirm this.   Pt underwent MBSS on 10/10/15, which revealed moderate pharyngeal dysphagia. Pt was advanced to a dysphagia 2 diet with nectar thick liquids. Appetite is still poor; PO: 25%. However, pt reports tolerating current diet texture well.   Nutrition-Focused physical exam completed. Findings are mild fat depletion, mild muscle depletion, and no edema. Suspect fat and muscle depletion are related to advanced age and decline in mobility.   Discussed importance of good nutritional intake to promote healing. Pt amenable to resume Ensure supplements, thickened to appropriate consistency.   Case discussed with RN.   Labs reviewed: Na: 134, K: 3.4.   Diet Order:  DIET DYS 2 Room service appropriate?: Yes; Fluid consistency:: Nectar Thick  Skin:  Reviewed, no  issues  Last BM:  PTA  Height:   Ht Readings from Last 1 Encounters:  10/08/15 5\' 2"  (1.575 m)    Weight:   Wt Readings from Last 1 Encounters:  10/11/15 144 lb 6.4 oz (65.499 kg)    Ideal Body Weight:  50 kg  BMI:  Body mass index is 26.4 kg/(m^2).  Estimated Nutritional Needs:   Kcal:  1400-1600  Protein:  60-70 grams  Fluid:  1.4-1.6 L  EDUCATION NEEDS:   Education needs addressed  Gilman Olazabal A. Jimmye Norman, RD, LDN, CDE Pager: [phone] After hours Pager: [phone]

## 2015-10-11 NOTE — Progress Notes (Signed)
OT Cancellation Note  Patient Details Name: [name] MRN: [id number] DOB: 1929-11-16   Cancelled Treatment:    Reason Eval/Treat Not Completed:  (Pt discharging to SNF today, will defer OT eval to SNF.)  [name] 10/11/2015, 2:40 PM

## 2015-10-11 NOTE — Care Management Important Message (Signed)
Important Message  Patient Details  Name: [name] MRN: [id number] Date of Birth: 1929/12/28   Medicare Important Message Given:  Yes    Janashia Parco Abena 10/11/2015, 11:37 AM

## 2015-10-11 NOTE — Clinical Social Work Placement (Signed)
   CLINICAL SOCIAL WORK PLACEMENT  NOTE  Date:  10/11/2015  Patient Details  Name: [name] MRN: [id number] Date of Birth: 1929-11-16  Clinical Social Work is seeking post-discharge placement for this patient at the [place] level of care (*CSW will initial, date and re-position this form in  chart as items are completed):  Yes   Patient/family provided with [place] Work Department's list of facilities offering this level of care within the geographic area requested by the patient (or if unable, by the patient's family).  Yes   Patient/family informed of their freedom to choose among providers that offer the needed level of care, that participate in Medicare, Medicaid or managed care program needed by the patient, have an available bed and are willing to accept the patient.  Yes   Patient/family informed of [facility]'s ownership interest in [facility] and [facility], as well as of the fact that they are under no obligation to receive care at these facilities.  PASRR submitted to EDS on 10/09/15     PASRR number received on 10/09/15     Existing PASRR number confirmed on       FL2 transmitted to all facilities in geographic area requested by pt/family on 10/09/15     FL2 transmitted to all facilities within larger geographic area on       Patient informed that his/her managed care company has contracts with or will negotiate with certain facilities, including the following:        Yes   Patient/family informed of bed offers received.  Patient chooses bed at [place] [place]     Physician recommends and patient chooses bed at      Patient to be transferred to [place] [place] on 10/11/15.  Patient to be transferred to facility by [facility]     Patient family notified on 10/11/15 of transfer.  Name of family member notified:  Harrel Lemon (pastor)     PHYSICIAN Please sign FL2     Additional Comment:     _______________________________________________ Cranford Mon, LCSW 10/11/2015, 2:24 PM

## 2015-10-11 NOTE — Discharge Summary (Signed)
Physician Discharge Summary  [name] [id number] DOB: 06/07/1929 DOA: 10/08/2015  PCP: Phineas Inches, MD  Admit date: 10/08/2015 Discharge date: 10/11/2015  Time spent: 30 minutes  Recommendations for Outpatient Follow-up:  1. Primary medical doctor follow-up in 1-2 weeks 2. Follow-up CBC BMP 1 week.   Discharge Diagnoses:  Principal Problem:   CAP (community acquired pneumonia) Active Problems:   Hypertension   Chest pain   Hyponatremia   OSA on CPAP   Seizure disorder (HCC)   HCAP (healthcare-associated pneumonia)   Debility   Dehydration   Pharyngeal dysphagia    Discharge Condition: stable  Diet recommendation: Diet recommendations: Dysphagia 2 (fine chop);Nectar-thick liquid Liquids provided via: Cup;No straw Medication Administration: Crushed with puree Supervision: Patient able to self feed;Intermittent supervision to cue for compensatory strategies Compensations: Slow rate;Small sips/bites;Multiple dry swallows after each bite/sip;Follow solids with liquid Postural Changes and/or Swallow Maneuvers: Seated upright 90 degrees  Filed Weights   10/08/15 2340 10/11/15 0547  Weight: 65 kg (143 lb 4.8 oz) 65.499 kg (144 lb 6.4 oz)    History of present illness:  [name] is a 82 y.o. female with medical history significant of sleep apnea on CPAP Seizure disorder, hypertension,  Presented with worsening fatigue and cough chest pain She was recently admitted for chest pain which felt to be secondary to esophagitis troponin was negative 3 echogram showed normal EF normal wall motion abnormalities she was diagnosed with bacterial sinusitis and treated with Augmentin. She had PT OT evaluation was recommended SNF but patient has been has refused bu husband of note he has severe dementia.  Came back again on June 14 complaining of chest pain CT and Job chest showed no evidence of PE but did show evidence of pneumonia she was started on Levaquin  discharged to home. Despite this she continued to feel worse has been having chest pain worse with coughing she reports fever. So weak cannot even walk or feed self. Patient stopped eating and drinking so that she doesn't have to get up to use the bathroom.    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 Patient has been has issues with dementia. She has known history of seizure disorder on Dilantin   IN ER: Afebrile heart rate up to 104 respirations 23 blood pressure 05/18/1951  WBC 9.1 hemoglobin 11 platelets 141 potassium 131 which is around her baseline bicarbonate 17 creatinine 1.3 which is up from baseline of 0.98 X-ray was repeated today showing improvement  Hospital Course:  . HCAP (healthcare-associated pneumonia) - treatment of HCAP w/ vanc and maxipime for 3 days. Blood cultures 2 were negative..Provide oxygen as needed, was quickly weaned.  . Hypertension - held lisinopril given acute renal failure allow some permissive hypertension; this will be restarted on discharge  . Hyponatremia - chronic, no neuro sx. Responds to IVF . OSA on CPAP ordered patient did well on CPAP while in the hospital  . Debility - OT/PT eval recommended inpatient rehabilitation stay to help maximize patient's progress Seizure disorder continue Dilantin and checked level was low. Ativan when necessary seizure, not used. Seizure precautions were in place . Marland Kitchen Chest pain - patient has had recent workup chest pain appears to be musculoskeletal secondary to repeated coughing, Tessalon Perles helped with patient's cough.  . Dehydration administered IV fluids. Patient responded well. Other plan as per orders. - Diff Swallowing - SLP eval appreciated, placed on dyphagia diet & pt educated by SLP. Pt had history of  dysphagia but was not on a diet and though she could just swallow normally.  Procedures:  MBS (i.e. Studies not automatically included, echos, thoracentesis, etc; not  x-rays)  Consultations:  SLP  Discharge Exam: Filed Vitals:   10/11/15 0547 10/11/15 1125  BP: 156/62 135/72  Pulse: 91 91  Temp: 97.5 F (36.4 C) 97.8 F (36.6 C)  Resp: 18 16     General:  No diaphoresis, anxious, no acute distress  Cardiovascular: Regular rate and rhythm no murmurs rubs or gallops  Respiratory: Clear to auscultation bilaterally no more breathing  Abdomen: Nondistended bowel sounds normal nontender palpation  Musculoskeletal: Moving all extremities, no deformity, 5 out of 5 strength     Discharge Instructions    Current Discharge Medication List    CONTINUE these medications which have NOT CHANGED   Details  amLODipine (NORVASC) 2.5 MG tablet Take 2.5 mg by mouth daily.    amoxicillin-clavulanate (AUGMENTIN) 875-125 MG tablet Take 1 tablet by mouth 2 (two) times daily. Qty: 6 tablet, Refills: 0    aspirin 81 MG tablet Take 81 mg by mouth every other day.     Calcium-Vitamin D 600-125 MG-UNIT TABS Take 1 tablet by mouth daily.    Coenzyme Q10 60 MG TABS Take 1 tablet by mouth daily.    fexofenadine (ALLEGRA) 180 MG tablet Take 180 mg by mouth daily.    folic acid (FOLVITE) 1 MG tablet Take 1 mg by mouth daily.    lisinopril (PRINIVIL,ZESTRIL) 20 MG tablet Take 20 mg by mouth daily.    NIFEdipine (PROCARDIA-XL/ADALAT-CC/NIFEDICAL-XL) 30 MG 24 hr tablet Take 30 mg by mouth daily Refills: 3    nitroGLYCERIN (NITROSTAT) 0.4 MG SL tablet Place 1 tablet (0.4 mg total) under the tongue every 5 (five) minutes as needed for chest pain. Qty: 10 tablet, Refills: 0    Omega-3 Fatty Acids (FISH OIL) 1000 MG CAPS Take 1 capsule by mouth daily.     oxybutynin (DITROPAN) 5 MG tablet Take 5 mg by mouth daily.    phenytoin (DILANTIN) 100 MG ER capsule Take 200 mg by mouth at bedtime.     levofloxacin (LEVAQUIN) 750 MG tablet Take 1 tablet (750 mg total) by mouth daily. Qty: 5 tablet, Refills: 0    pantoprazole (PROTONIX) 40 MG tablet Take 1 tablet  (40 mg total) by mouth daily. Qty: 30 tablet, Refills: 0       Allergies  Allergen Reactions  . Codeine Hypertension    Causes a sensation of "pressure" within her head  . Latex Itching  . Tape Itching   Follow-up Information    Follow up with Phineas Inches, MD.   Specialty:  Family Medicine   Why:  CAP if already dischared from inpatient rehab   Contact information:   [address] [place] [address] [phone]        The results of significant diagnostics from this hospitalization (including imaging, microbiology, ancillary and laboratory) are listed below for reference.    Significant Diagnostic Studies: Dg Chest 2 View  10/08/2015  CLINICAL DATA:  Central chest pain for 3 days. Pain worse with coughing. Recent diagnosis of pneumonia. EXAM: CHEST  2 VIEW COMPARISON:  Radiographs and CT 3 days prior. FINDINGS: Improved bibasilar aeration from prior exam. No new focal consolidation. Heart size and mediastinal contours are unchanged. No pulmonary edema, pleural effusion or pneumothorax. No acute osseous abnormality. IMPRESSION: Improved bibasilar aeration with decreased bibasilar opacities. No new abnormality is seen.  Electronically Signed   By: Jeb Levering M.D.   On: 10/08/2015 18:47   Dg Chest 2 View  10/05/2015  CLINICAL DATA:  Mid chest pain for 3 days EXAM: CHEST  2 VIEW COMPARISON:  10/01/2015 FINDINGS: Normal heart size and pulmonary vascularity. No focal airspace disease or consolidation in the lungs. No blunting of costophrenic angles. No pneumothorax. Mediastinal contours appear intact. Degenerative changes in the spine and shoulders. Calcification of the aorta. IMPRESSION: No active cardiopulmonary disease. Electronically Signed   By: Lucienne Capers M.D.   On: 10/05/2015 23:17   Dg Chest 2 View  10/01/2015  CLINICAL DATA:  Back pain, shortness of breath, dry cough EXAM: CHEST  2 VIEW COMPARISON:  02/20/2009 FINDINGS: Cardiomediastinal  silhouette is stable. No acute infiltrate or pleural effusion. No pulmonary edema. Osteopenia and mild degenerative changes mid thoracic spine. IMPRESSION: No active cardiopulmonary disease. Electronically Signed   By: Lahoma Crocker M.D.   On: 10/01/2015 20:08   Ct Angio Chest Pe W/cm &/or Wo Cm  10/06/2015  CLINICAL DATA:  Acute onset of generalized chest pain. Elevated D-dimer. Initial encounter. EXAM: CT ANGIOGRAPHY CHEST WITH CONTRAST TECHNIQUE: Multidetector CT imaging of the chest was performed using the standard protocol during bolus administration of intravenous contrast. Multiplanar CT image reconstructions and MIPs were obtained to evaluate the vascular anatomy. CONTRAST:  100 mL of Isovue 370 IV contrast COMPARISON:  Chest radiograph performed 10/05/2015 FINDINGS: There is no evidence of pulmonary embolus. Minimal patchy nodular opacities are noted within both lungs. Some of this reflects atelectasis, though mild infection cannot be excluded. There is no evidence of pleural effusion or pneumothorax. Diffuse coronary artery calcifications are seen. A borderline prominent 1.1 cm precarinal node is noted. No additional mediastinal lymphadenopathy is seen. No pericardial effusion is identified. Scattered calcification is noted along the aortic arch and proximal great vessels. No axillary lymphadenopathy is seen. The thyroid gland is unremarkable in appearance. The visualized portions of the liver and spleen are unremarkable. The patient is status post cholecystectomy, with clips noted at the gallbladder fossa. The visualized portions of the pancreas, adrenal glands and kidneys are within normal limits. Scattered calcification is noted along the proximal abdominal aorta. No acute osseous abnormalities are seen. Mild nonspecific sclerotic change is noted along the lower thoracic and upper lumbar spine. Review of the MIP images confirms the above findings. IMPRESSION: 1. No evidence of pulmonary embolus. 2.  Minimal patchy nodular opacities within both lungs. Some of this reflects atelectasis, though mild infection cannot be excluded. 3. Diffuse coronary artery calcifications seen. 4. Borderline prominent 1.1 cm precarinal node noted. Electronically Signed   By: Garald Balding M.D.   On: 10/06/2015 02:34   Dg Swallowing Func-speech Pathology  10/10/2015  Objective Swallowing Evaluation: Type of Study: MBS-Modified Barium Swallow Study Patient Details Name: GARRETT MONETTE MRN: [id number] Date of Birth: 12-23-1929 Today's Date: 10/10/2015 Time: SLP Start Time (ACUTE ONLY): 1307-SLP Stop Time (ACUTE ONLY): 1321 SLP Time Calculation (min) (ACUTE ONLY): 14 min Past Medical History: Past Medical History Diagnosis Date . Allergic rhinitis  . Skin cancer  . Hypertension  . OSA (obstructive sleep apnea)  . Irregular heart rate  . Abdominal pain, other specified site  Past Surgical History: Past Surgical History Procedure Laterality Date . Back surgery   . Cholecystectomy   . Total abdominal hysterectomy   HPI: 82 y.o. female with medical history significant of sleep apnea on CPAP, Seizure disorder, hypertension, here with HCAP dehydration and debility. Pt  has chest pain due to repeated coughing.   No history of SLP treatment in chart.  Subjective: pt alert, pleasant Assessment / Plan / Recommendation CHL IP CLINICAL IMPRESSIONS 10/10/2015 Therapy Diagnosis Moderate pharyngeal phase dysphagia Clinical Impression Pt has a moderate pharyngeal dysphagia with primarily motor issues. She has significantly reduced base of tongue retraction, hyolaryngeal movement, epiglottic inversion, and pharyngeal squeeze, resulting in the need for multiple reflexive swallows to clear single boluses. Moderate amounts of residue remain in the valleculae post-swallow as well as some along the posterior pharyngeal wall, with some reduction with cues for multiple swallows. Aspiration occurs during the swallow with thin liquids as pt is working to  clear the bolus from her pharynx. Aspiration is sensed but with a very weak throat clear. She cannot produce much more subglottal pressure despite cues for more forceful coughing. Given the above, recommend Dys 2 diet and nectar thick liquids by cup, alternating solids/liquids and utilizing multiple effortful swallows per bolus. Impact on safety and function Moderate aspiration risk   CHL IP TREATMENT RECOMMENDATION 10/10/2015 Treatment Recommendations Therapy as outlined in treatment plan below   Prognosis 10/10/2015 Prognosis for Safe Diet Advancement Good Barriers to Reach Goals Severity of deficits Barriers/Prognosis Comment -- CHL IP DIET RECOMMENDATION 10/10/2015 SLP Diet Recommendations Dysphagia 2 (Fine chop) solids;Nectar thick liquid Liquid Administration via Cup;No straw Medication Administration Crushed with puree Compensations Slow rate;Small sips/bites Postural Changes Remain semi-upright after after feeds/meals (Comment);Seated upright at 90 degrees   CHL IP OTHER RECOMMENDATIONS 10/10/2015 Recommended Consults -- Oral Care Recommendations Oral care BID Other Recommendations --   CHL IP FOLLOW UP RECOMMENDATIONS 10/10/2015 Follow up Recommendations Skilled Nursing facility   [facility] IP FREQUENCY AND DURATION 10/10/2015 Speech Therapy Frequency (ACUTE ONLY) min 2x/week Treatment Duration 2 weeks      CHL IP ORAL PHASE 10/10/2015 Oral Phase WFL Oral - Pudding Teaspoon -- Oral - Pudding Cup -- Oral - Honey Teaspoon -- Oral - Honey Cup -- Oral - Nectar Teaspoon -- Oral - Nectar Cup -- Oral - Nectar Straw -- Oral - Thin Teaspoon -- Oral - Thin Cup -- Oral - Thin Straw -- Oral - Puree -- Oral - Mech Soft -- Oral - Regular -- Oral - Multi-Consistency -- Oral - Pill -- Oral Phase - Comment --  CHL IP PHARYNGEAL PHASE 10/10/2015 Pharyngeal Phase Impaired Pharyngeal- Pudding Teaspoon -- Pharyngeal -- Pharyngeal- Pudding Cup -- Pharyngeal -- Pharyngeal- Honey Teaspoon -- Pharyngeal -- Pharyngeal- Honey Cup -- Pharyngeal --  Pharyngeal- Nectar Teaspoon -- Pharyngeal -- Pharyngeal- Nectar Cup Delayed swallow initiation-vallecula;Reduced pharyngeal peristalsis;Reduced epiglottic inversion;Reduced anterior laryngeal mobility;Reduced laryngeal elevation;Reduced tongue base retraction;Pharyngeal residue - valleculae;Pharyngeal residue - posterior pharnyx Pharyngeal -- Pharyngeal- Nectar Straw -- Pharyngeal -- Pharyngeal- Thin Teaspoon -- Pharyngeal -- Pharyngeal- Thin Cup Delayed swallow initiation-vallecula;Reduced pharyngeal peristalsis;Reduced epiglottic inversion;Reduced anterior laryngeal mobility;Reduced laryngeal elevation;Reduced tongue base retraction;Pharyngeal residue - valleculae;Pharyngeal residue - posterior pharnyx;Penetration/Aspiration during swallow Pharyngeal Material enters airway, passes BELOW cords and not ejected out despite cough attempt by patient Pharyngeal- Thin Straw Delayed swallow initiation-vallecula;Reduced pharyngeal peristalsis;Reduced epiglottic inversion;Reduced anterior laryngeal mobility;Reduced laryngeal elevation;Reduced tongue base retraction;Pharyngeal residue - valleculae;Pharyngeal residue - posterior pharnyx;Penetration/Aspiration during swallow Pharyngeal Material enters airway, passes BELOW cords and not ejected out despite cough attempt by patient Pharyngeal- Puree Delayed swallow initiation-vallecula;Reduced pharyngeal peristalsis;Reduced epiglottic inversion;Reduced anterior laryngeal mobility;Reduced laryngeal elevation;Reduced tongue base retraction;Pharyngeal residue - valleculae;Pharyngeal residue - posterior pharnyx Pharyngeal -- Pharyngeal- Mechanical Soft Delayed swallow initiation-vallecula;Reduced pharyngeal peristalsis;Reduced epiglottic inversion;Reduced anterior laryngeal mobility;Reduced laryngeal elevation;Reduced tongue  base retraction;Pharyngeal residue - valleculae;Pharyngeal residue - posterior pharnyx Pharyngeal -- Pharyngeal- Regular -- Pharyngeal -- Pharyngeal-  Multi-consistency -- Pharyngeal -- Pharyngeal- Pill -- Pharyngeal -- Pharyngeal Comment --  No flowsheet data found. No flowsheet data found. Germain Osgood, M.A. CCC-SLP [phone] Germain Osgood 10/10/2015, 2:52 PM               Microbiology: Recent Results (from the past 240 hour(s))  Urine culture     Status: Abnormal   Collection Time: 10/09/15  9:14 AM  Result Value Ref Range Status   Specimen Description URINE, CLEAN CATCH  Final   Special Requests NONE  Final   Culture 30,000 COLONIES/mL YEAST (A)  Final   Report Status 10/10/2015 FINAL  Final     Labs: Basic Metabolic Panel:  Recent Labs Lab 10/05/15 2249 10/08/15 1747 10/09/15 0405 10/10/15 1037 10/11/15 0605  NA 132* 131* 135 134* 131*  K 4.1 4.1 3.7 3.4* 3.7  CL 104 105 107 103 104  CO2 21* 17* 20* 22 22  GLUCOSE 137* 94 122* 146* 123*  BUN 33* 34* 25* 11 11  CREATININE 0.98 1.30* 1.10* 0.85 0.66  CALCIUM 9.0 9.0 8.7* 8.8* 8.5*  PHOS  --   --   --   --  2.8   Liver Function Tests:  Recent Labs Lab 10/05/15 2249 10/08/15 1747 10/09/15 0405 10/11/15 0605  AST 23 28 22   --   ALT 25 24 22   --   ALKPHOS 71 65 64  --   BILITOT 0.5 0.7 0.6  --   PROT 6.1* 6.0* 5.4*  --   ALBUMIN 3.3* 3.2* 2.9* 2.7*   No results for input(s): LIPASE, AMYLASE in the last 168 hours. No results for input(s): AMMONIA in the last 168 hours. CBC:  Recent Labs Lab 10/05/15 2249 10/08/15 1747 10/09/15 0405 10/10/15 1037 10/11/15 0605  WBC 8.6 9.1 9.0 9.4 9.3  NEUTROABS  --   --  7.2 7.1 6.5  HGB 10.7* 11.0* 10.8* 12.5 11.8*  HCT 32.4* 32.8* 32.5* 38.0 35.6*  MCV 91.5 90.1 90.8 92.2 93.4  PLT 152 141* 128* 147* 143*   Cardiac Enzymes: No results for input(s): CKTOTAL, CKMB, CKMBINDEX, TROPONINI in the last 168 hours. BNP: BNP (last 3 results)  Recent Labs  10/08/15 1755  BNP 41.0    ProBNP (last 3 results) No results for input(s): PROBNP in the last 8760 hours.  CBG:  Recent Labs Lab 10/09/15 0237   GLUCAP 97       Signed:  Elwin Mocha MD  FACP  [facility] 10/11/2015, 1:36 PM

## 2016-02-13 DIAGNOSIS — E559 Vitamin D deficiency, unspecified: Secondary | ICD-10-CM | POA: Insufficient documentation

## 2016-08-30 DIAGNOSIS — R748 Abnormal levels of other serum enzymes: Secondary | ICD-10-CM | POA: Insufficient documentation

## 2017-01-17 DIAGNOSIS — R54 Age-related physical debility: Secondary | ICD-10-CM | POA: Insufficient documentation

## 2017-04-20 IMAGING — CT CT ANGIO CHEST
2 of 9 series · 18 of 36 positions shown · IV contrast (Omni 300)
Comparison: Chest radiograph performed 10/05/2015

CLINICAL DATA: Acute onset of generalized chest pain. Elevated
D-dimer. Initial encounter.

EXAM:
CT ANGIOGRAPHY CHEST WITH CONTRAST
TECHNIQUE: Multidetector CT imaging of the chest was performed using the
standard protocol during bolus administration of intravenous
contrast. Multiplanar CT image reconstructions and MIPs were
obtained to evaluate the vascular anatomy.
CONTRAST:  100 mL of Isovue 370 IV contrast

[Series 7: pe thins · axial · 0.85mm/px · z∈[-244,+38]mm · 17 of 636 slices shown]
[im 36/636  lung]
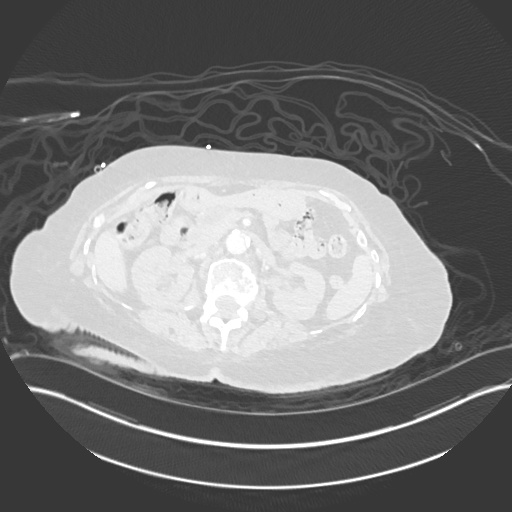
[im 71/636  mediastinal]
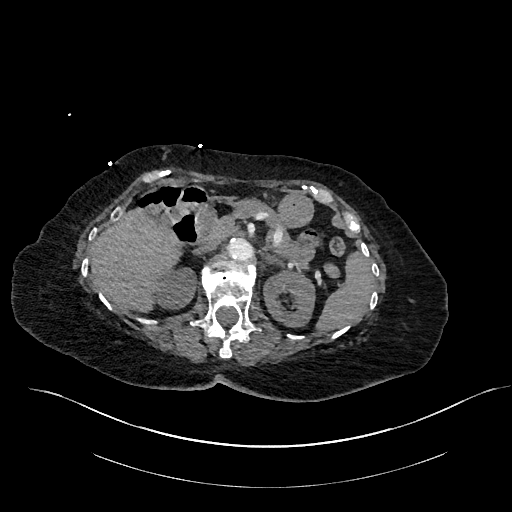
[im 106/636  lung]
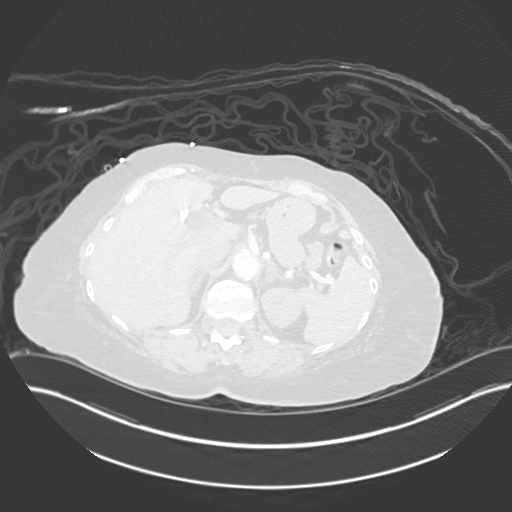
[im 142/636  mediastinal]
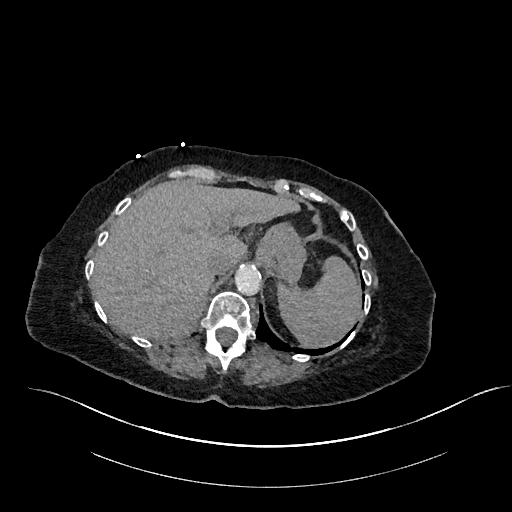
[im 177/636  lung]
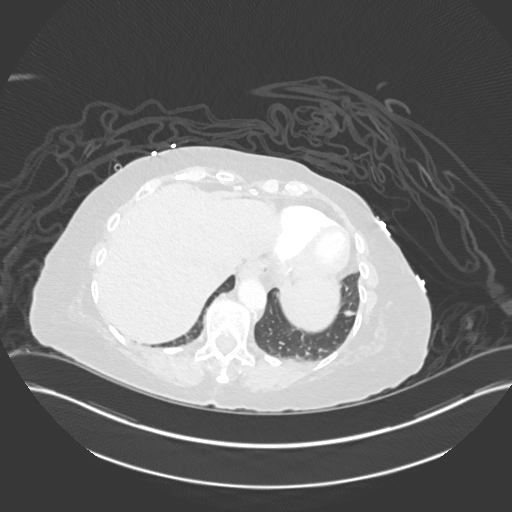
[im 212/636  mediastinal]
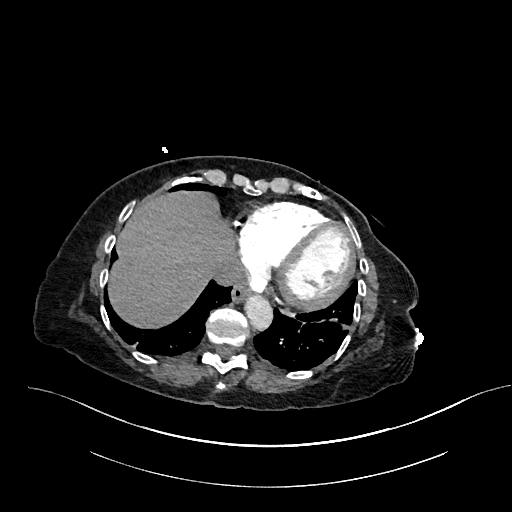
[im 247/636  lung]
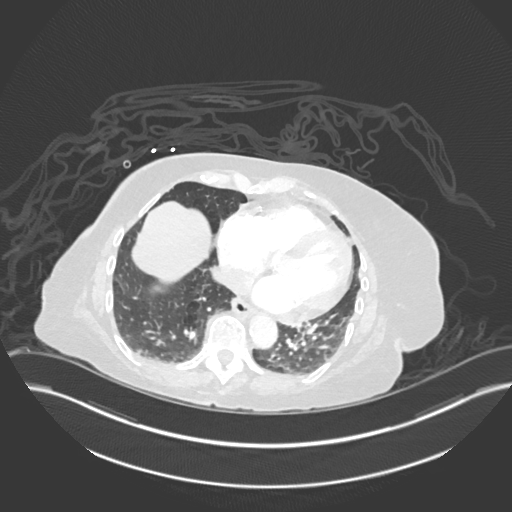
[im 283/636  mediastinal]
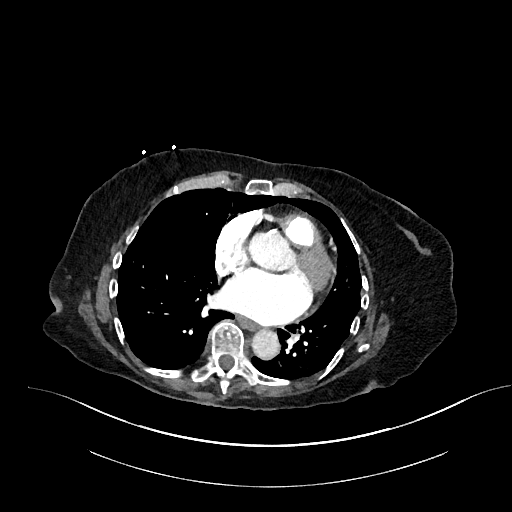
[im 318/636  lung]
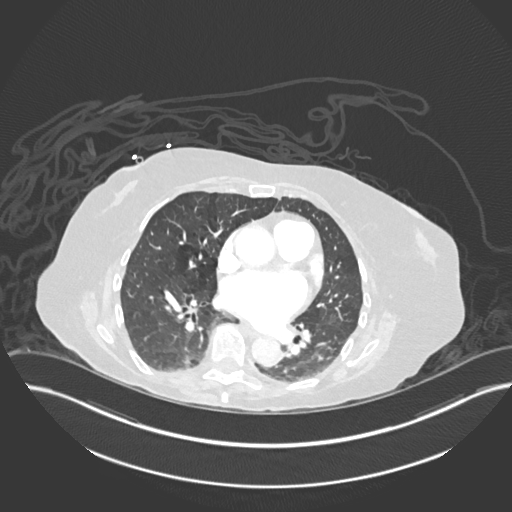
[im 353/636  mediastinal]
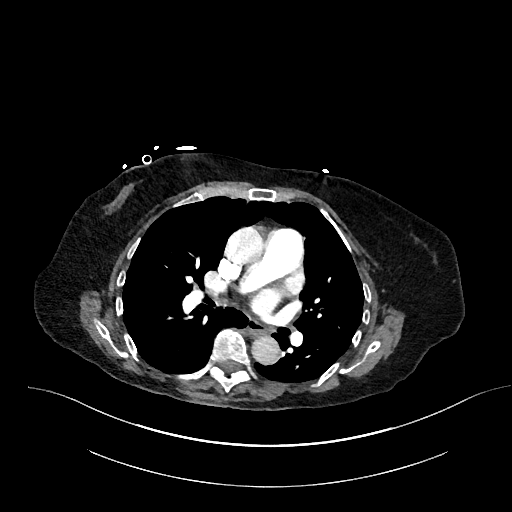
[im 389/636  lung]
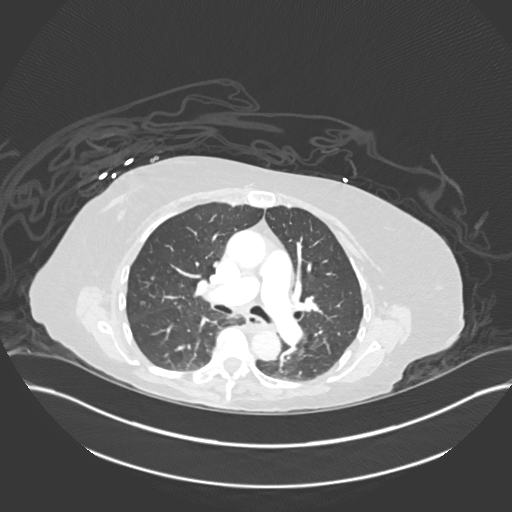
[im 424/636  mediastinal]
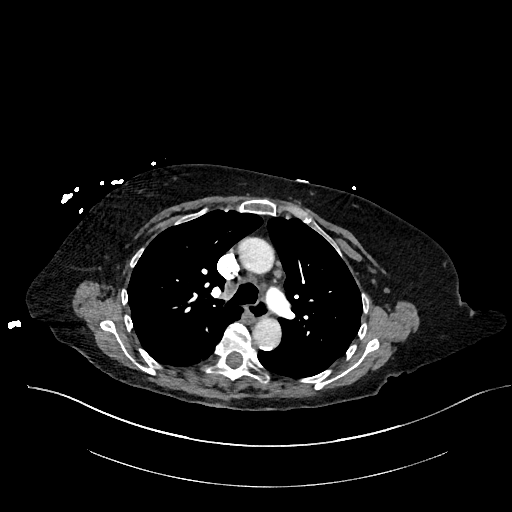
[im 459/636  lung]
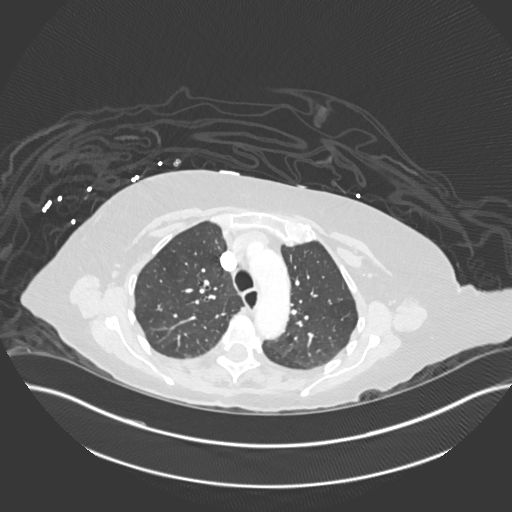
[im 494/636  mediastinal]
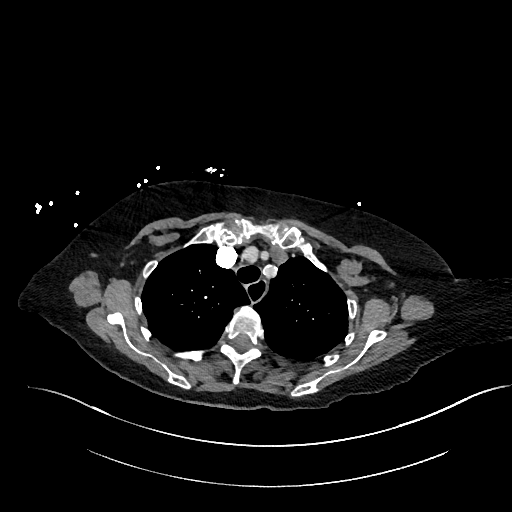
[im 530/636  lung]
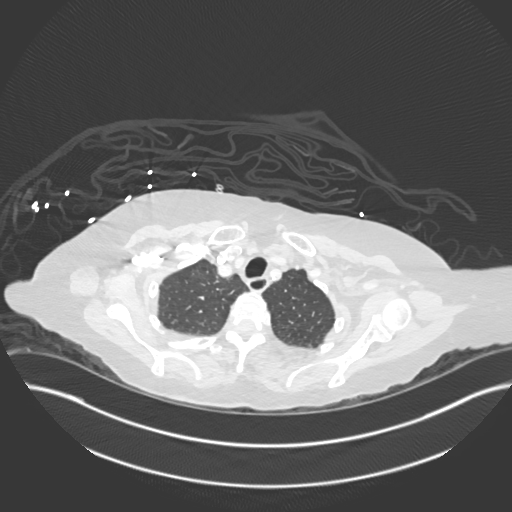
[im 565/636  mediastinal]
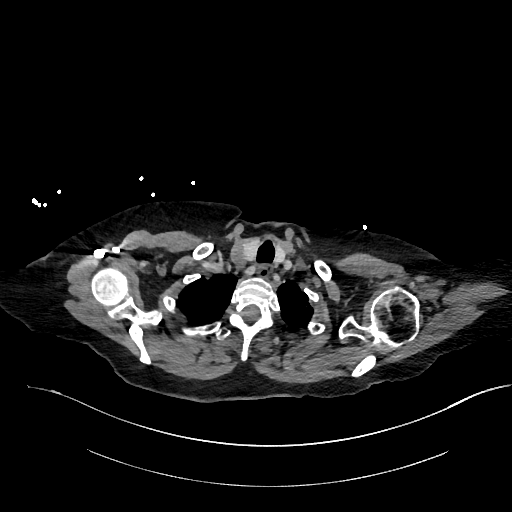
[im 600/636  lung]
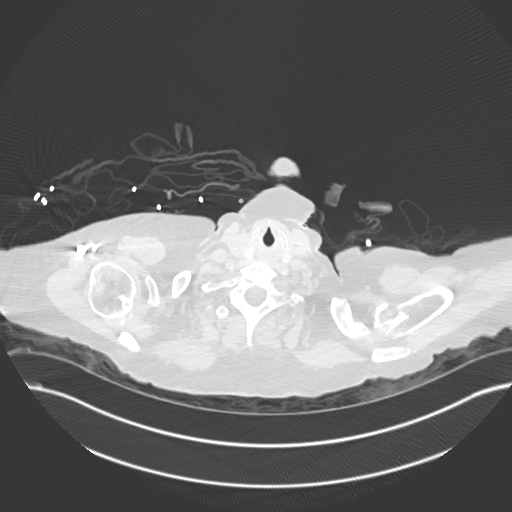

[Series 8: pe 2mm cor · coronal · 0.63mm/px · 1 of 113 slices shown]
[im 57/113  mediastinal]
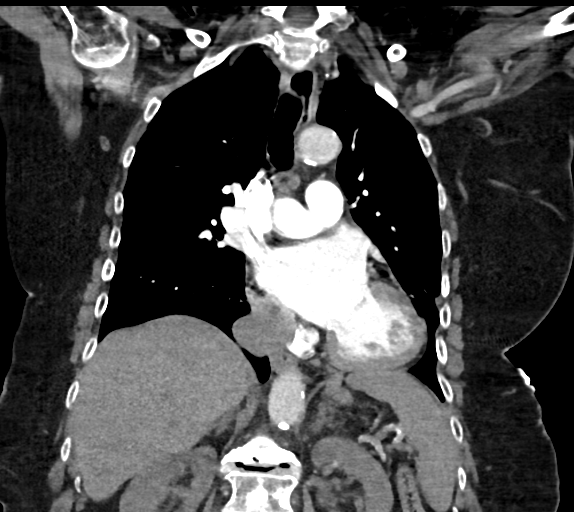

[18 of 36 positions shown; findings below may reference images not displayed]

FINDINGS: There is no evidence of pulmonary embolus.

Minimal patchy nodular opacities are noted within both lungs. Some
of this reflects atelectasis, though mild infection cannot be
excluded. There is no evidence of pleural effusion or pneumothorax.

Diffuse coronary artery calcifications are seen. A borderline
prominent 1.1 cm precarinal node is noted. No additional mediastinal
lymphadenopathy is seen. No pericardial effusion is identified.
Scattered calcification is noted along the aortic arch and proximal
great vessels. No axillary lymphadenopathy is seen. The thyroid
gland is unremarkable in appearance.

The visualized portions of the liver and spleen are unremarkable.
The patient is status post cholecystectomy, with clips noted at the
gallbladder fossa. The visualized portions of the pancreas, adrenal
glands and kidneys are within normal limits. Scattered calcification
is noted along the proximal abdominal aorta.

No acute osseous abnormalities are seen. Mild nonspecific sclerotic
change is noted along the lower thoracic and upper lumbar spine.

Review of the MIP images confirms the above findings.
IMPRESSION: 1. No evidence of pulmonary embolus.
2. Minimal patchy nodular opacities within both lungs. Some of this
reflects atelectasis, though mild infection cannot be excluded.
3. Diffuse coronary artery calcifications seen.
4. Borderline prominent 1.1 cm precarinal node noted.

## 2017-04-22 IMAGING — DX DG CHEST 2V
2 series · 2 of 2 positions shown · non-contrast
Comparison: Radiographs and CT 3 days prior.

CLINICAL DATA: Central chest pain for 3 days. Pain worse with
coughing. Recent diagnosis of pneumonia.

EXAM:
CHEST  2 VIEW

[chest lat]
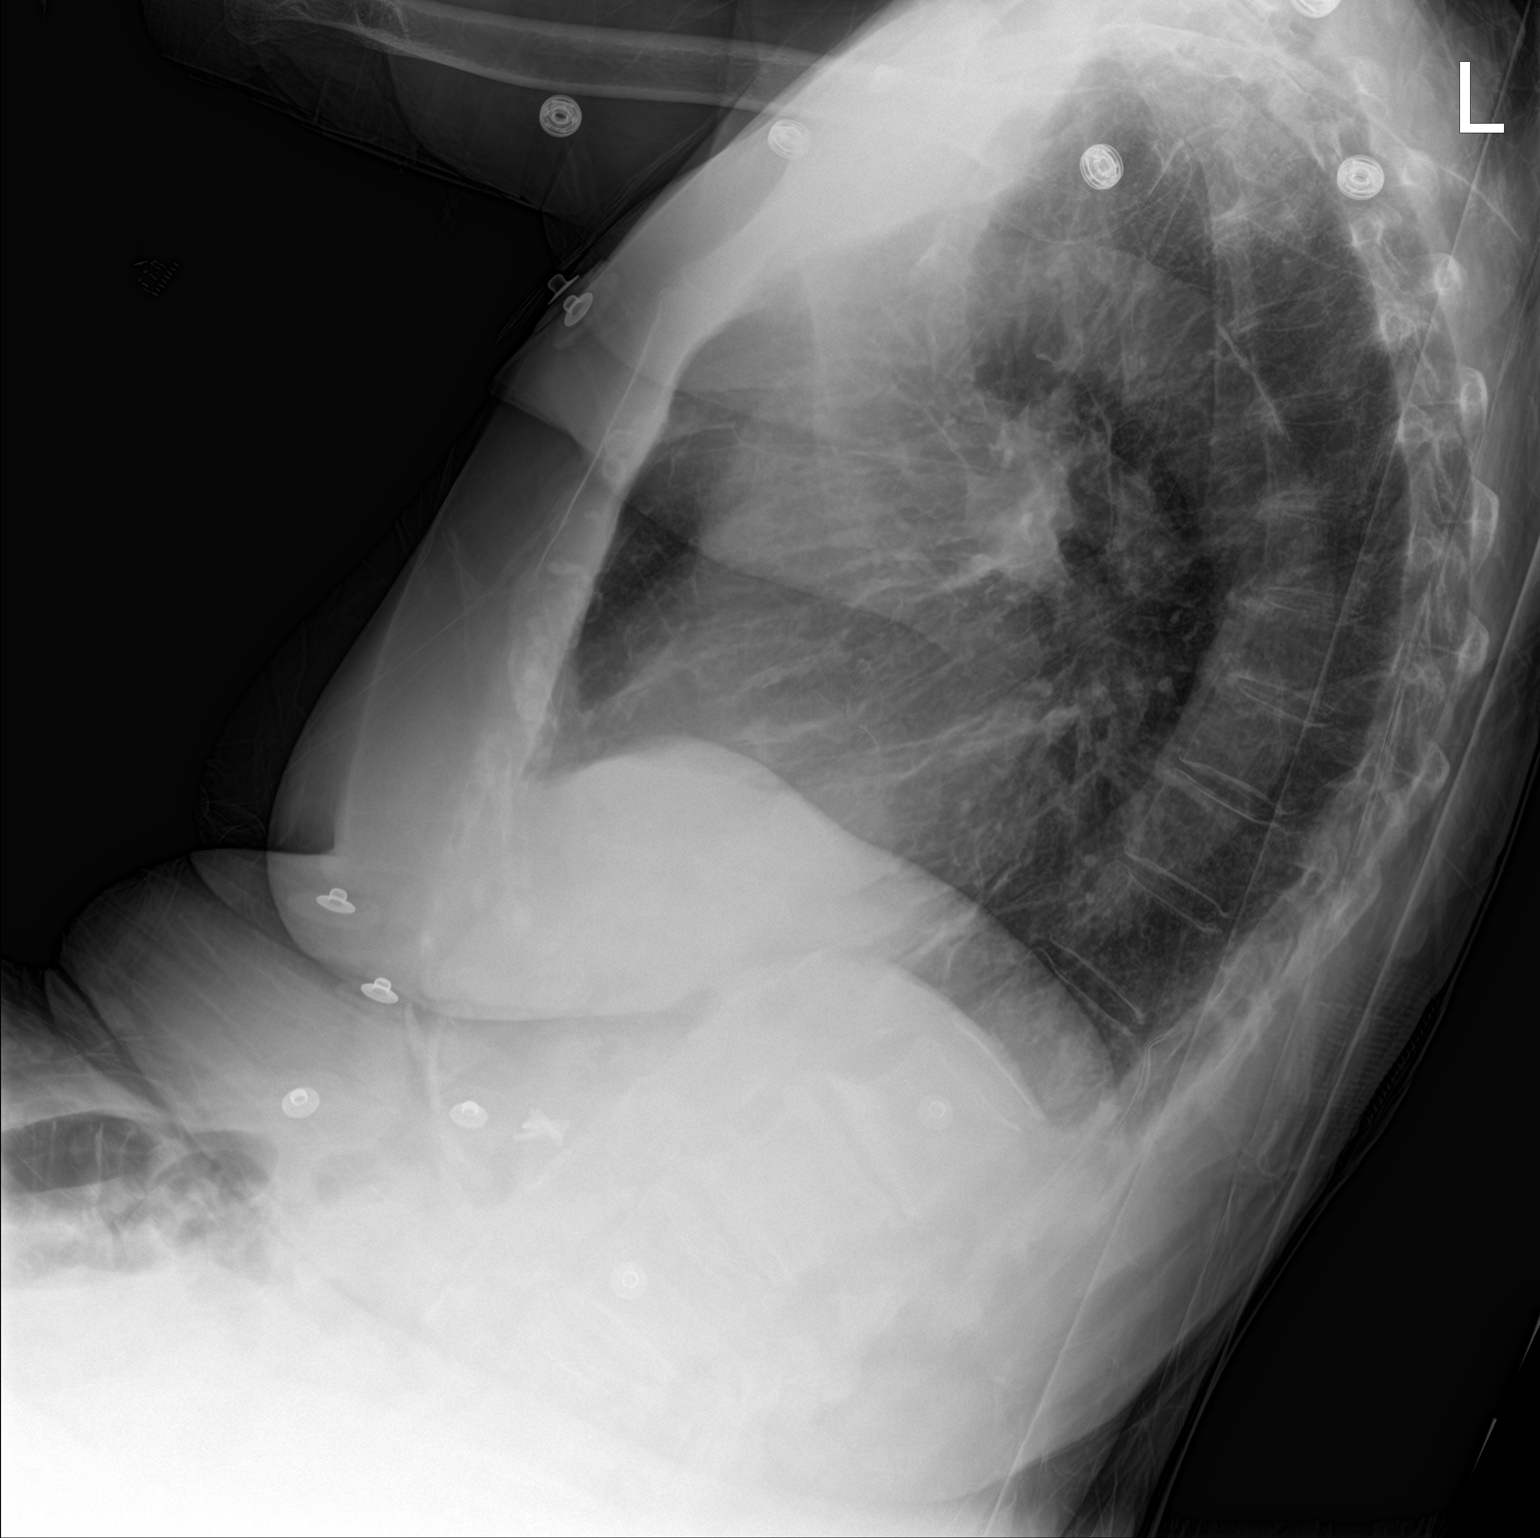

[chest ap]
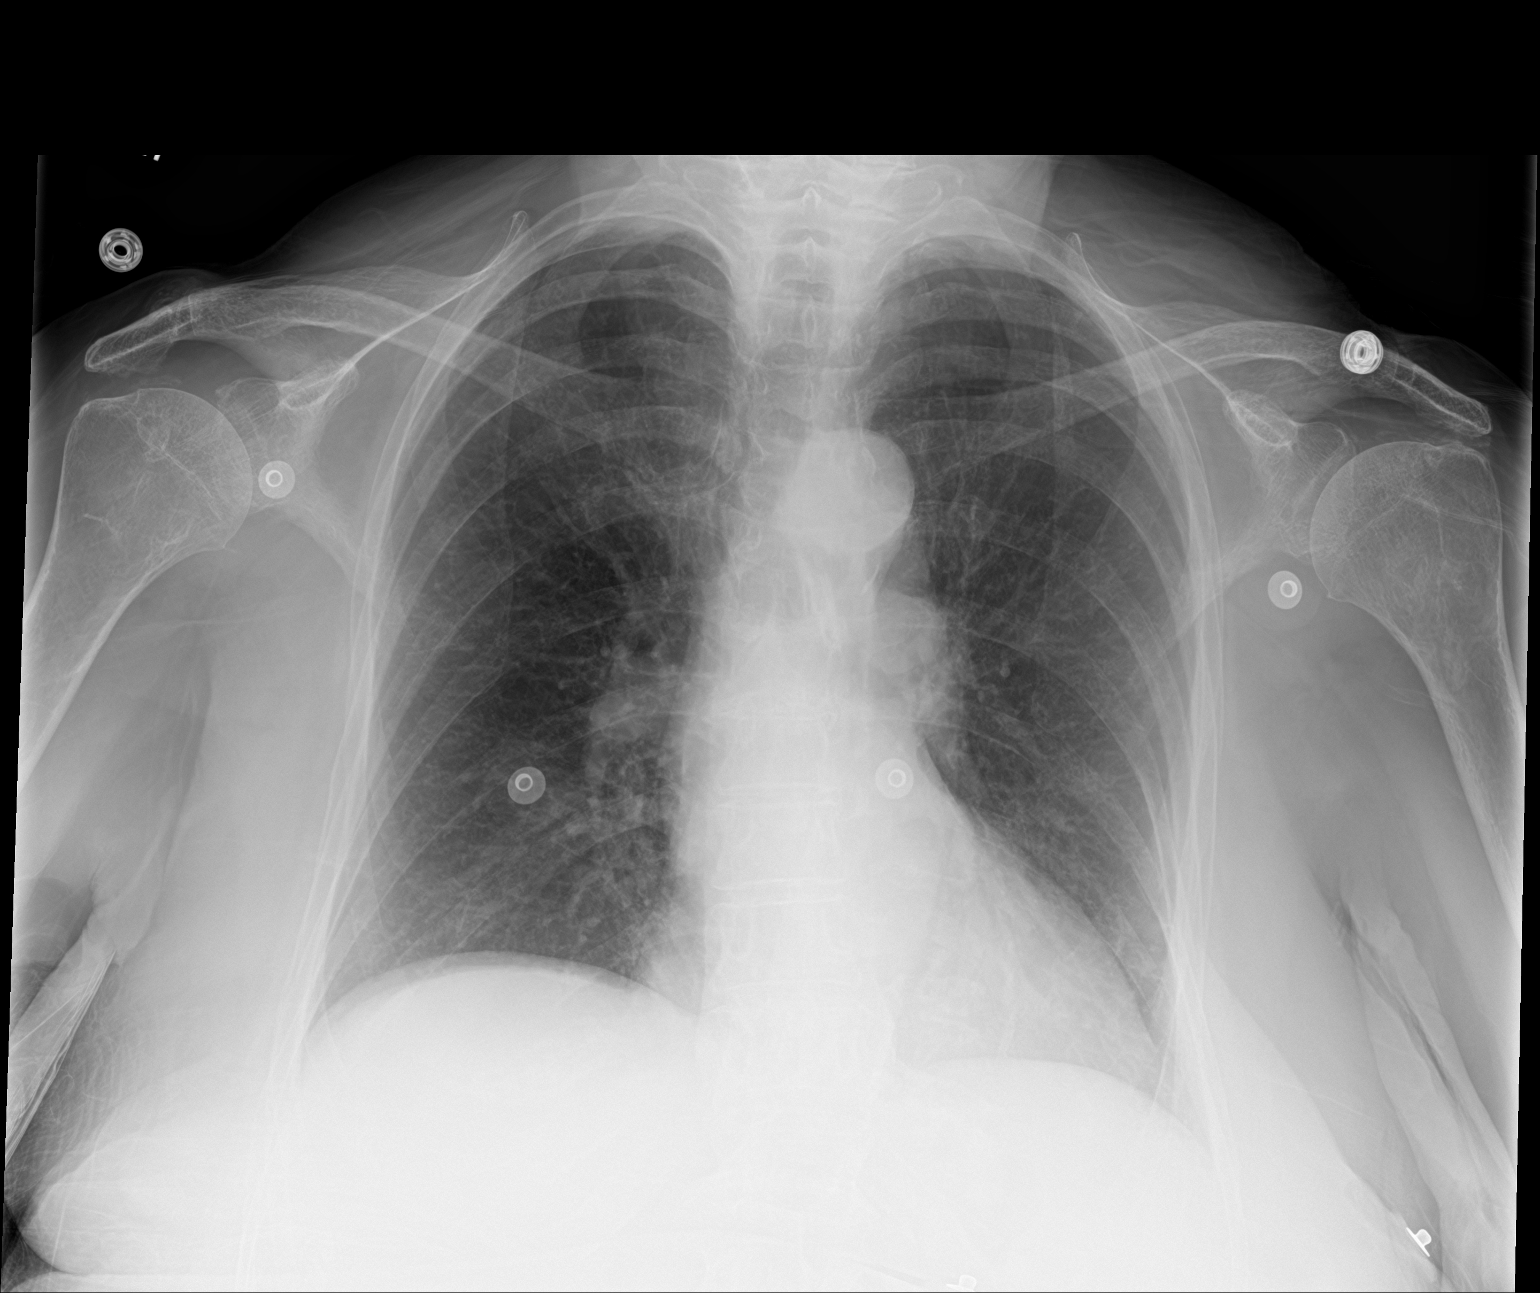

[2 of 2 positions shown; findings below may reference images not displayed]

FINDINGS: Improved bibasilar aeration from prior exam. No new focal
consolidation. Heart size and mediastinal contours are unchanged. No
pulmonary edema, pleural effusion or pneumothorax. No acute osseous
abnormality.
IMPRESSION: Improved bibasilar aeration with decreased bibasilar opacities. No
new abnormality is seen.

## 2017-04-24 IMAGING — RF DG SWALLOWING FUNCTION - [facility]
1 series · 18 of 24 positions shown · non-contrast
Comparison: none

[Series 1: run · 19 acquisitions, 18 frames shown]
[im 1/19]
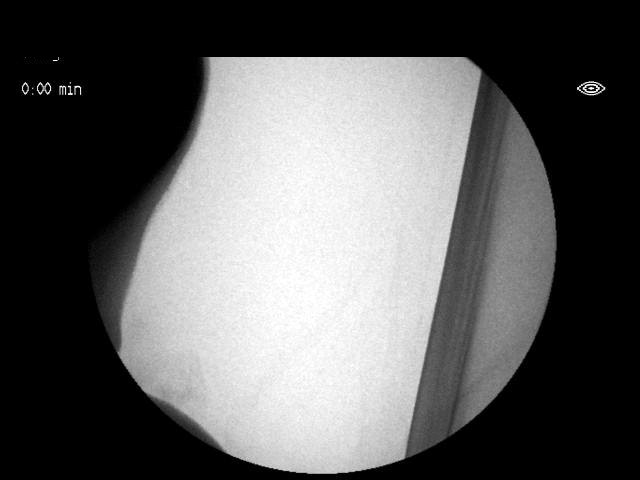
[im 2/19]
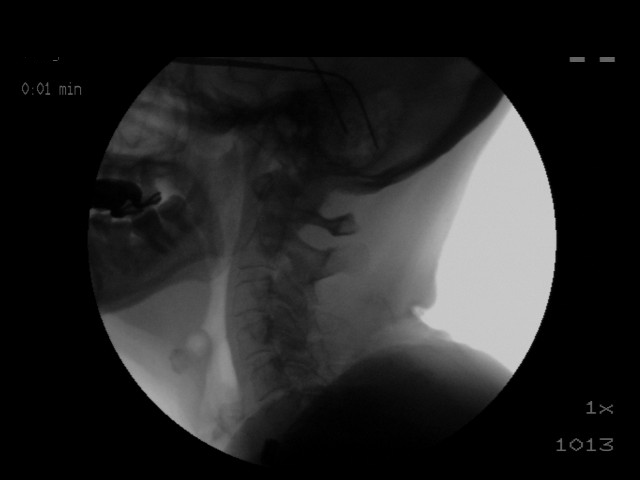
[im 3/19]
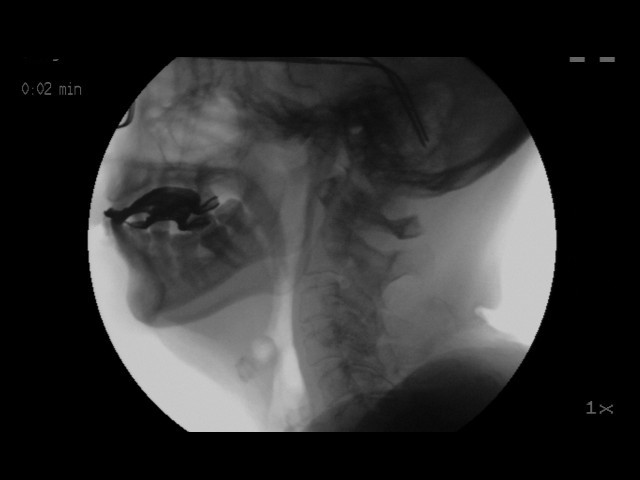
[im 4/19]
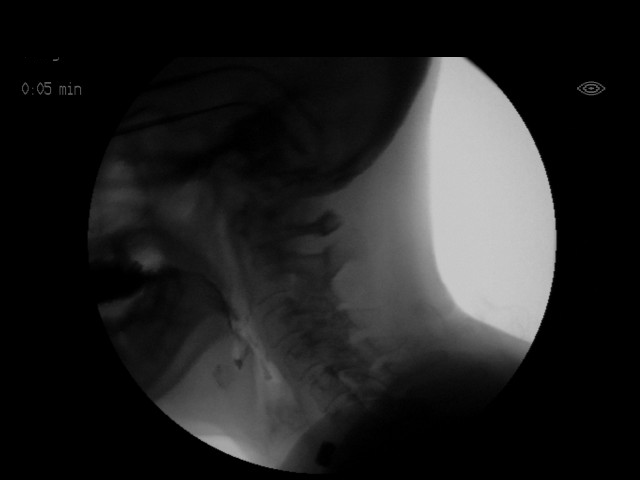
[im 6/19]
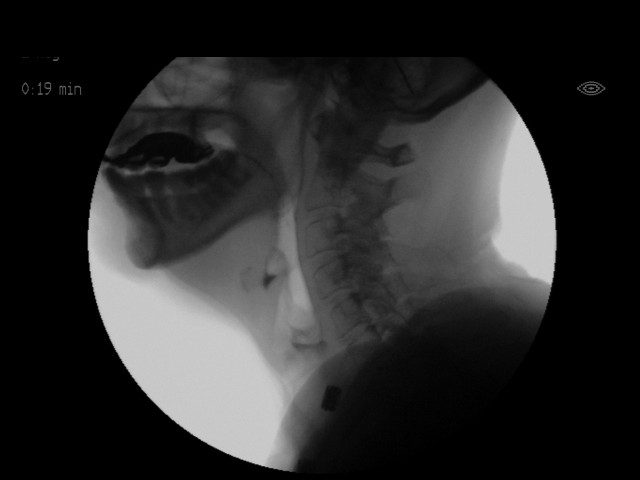
[im 6/19]
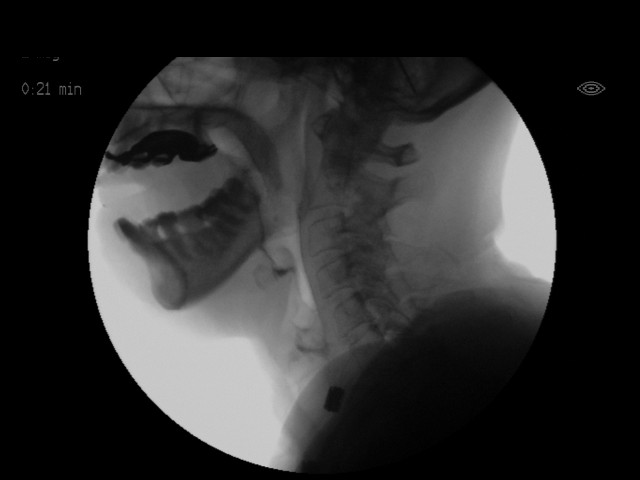
[im 7/19]
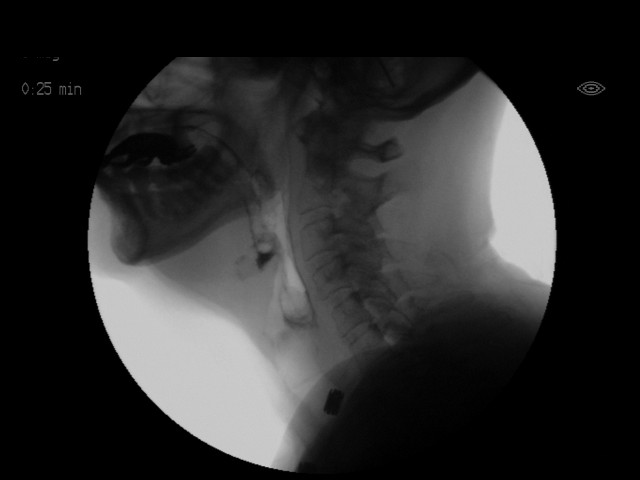
[im 9/19]
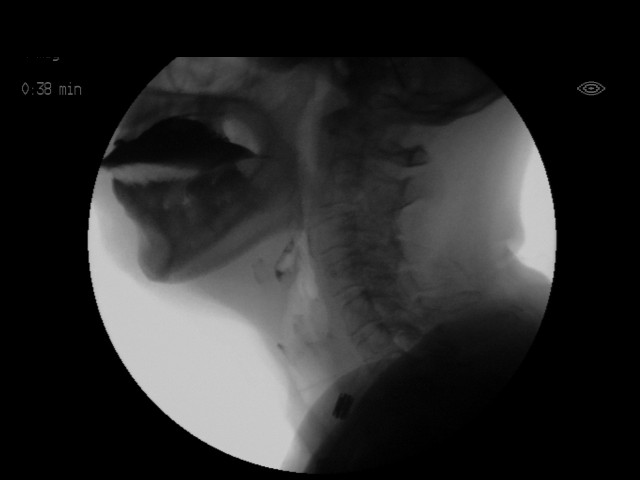
[im 10/19]
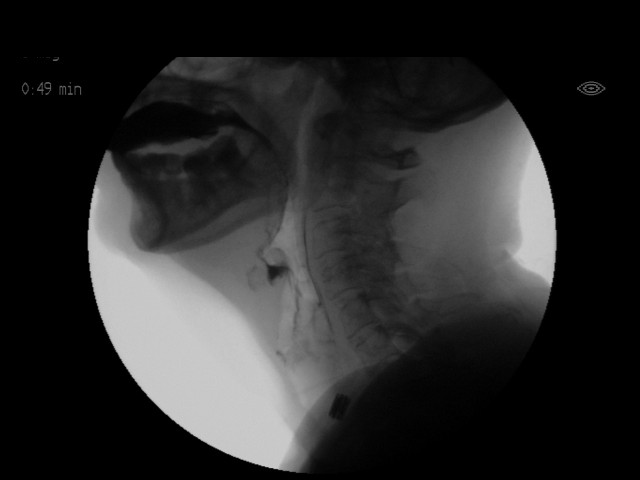
[im 10/19]
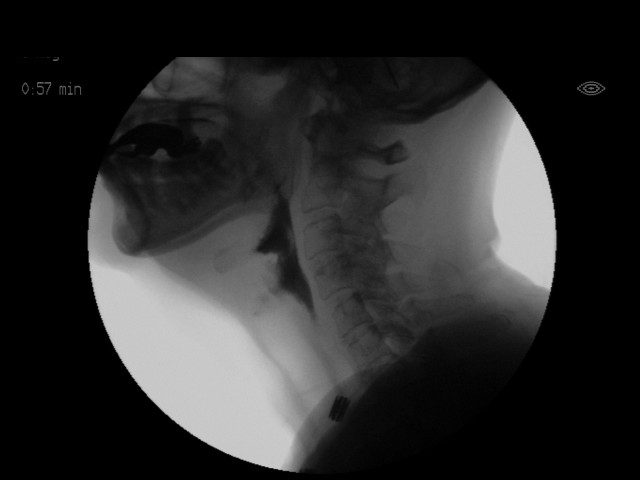
[im 12/19]
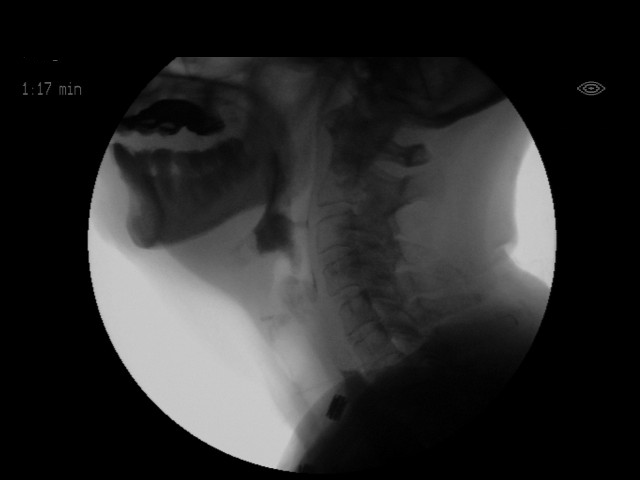
[im 13/19]
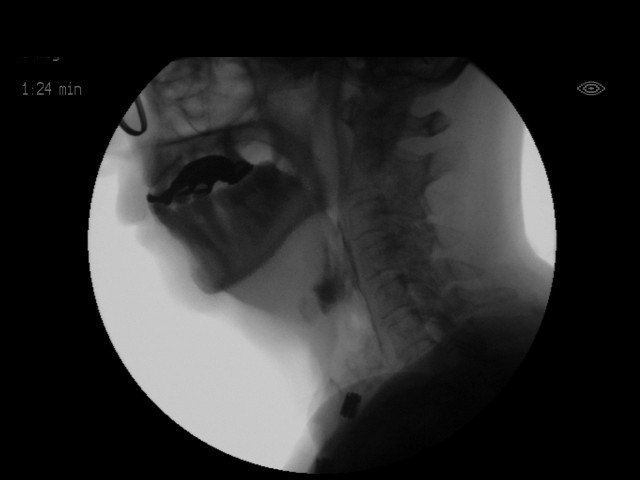
[im 14/19]
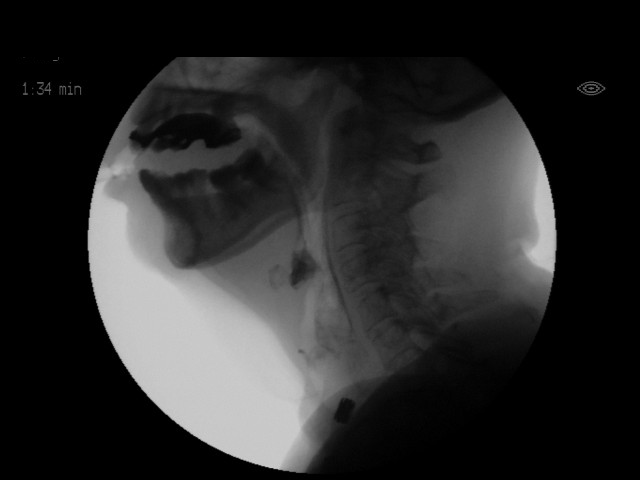
[im 15/19]
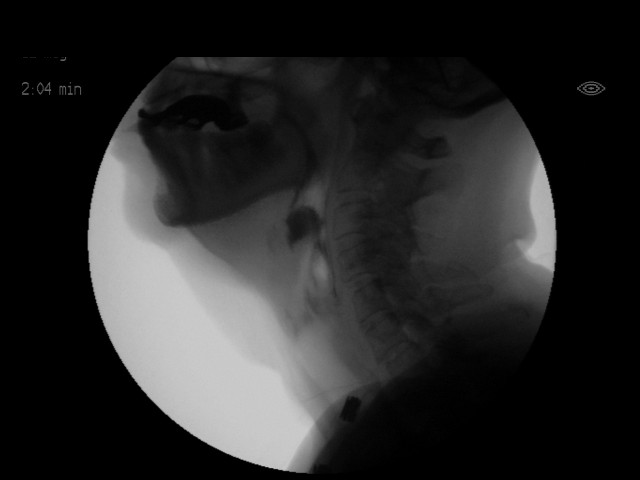
[im 16/19]
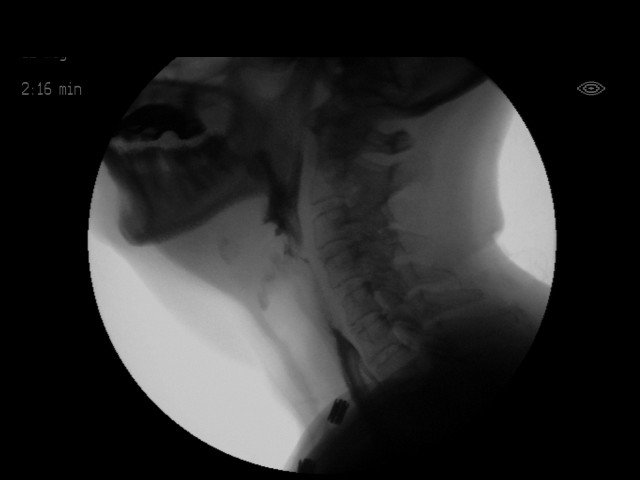
[im 17/19]
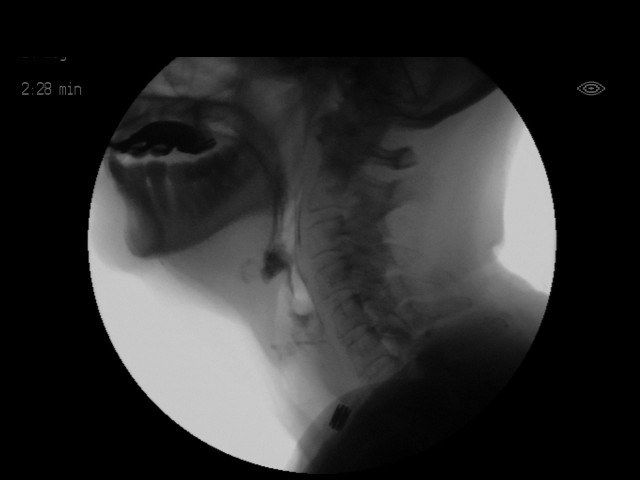
[im 19/19]
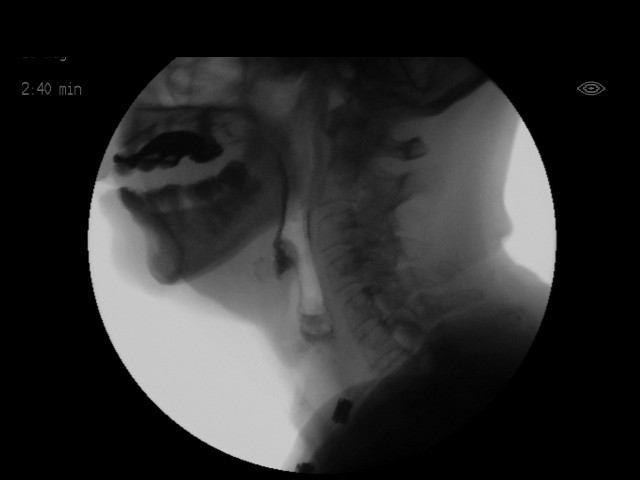
[im 19/19]
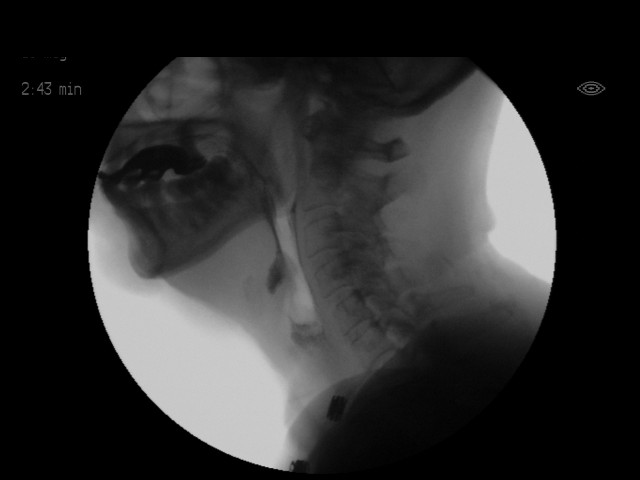

[18 of 24 positions shown; findings below may reference images not displayed]

FLUOROSCOPY FOR SWALLOWING FUNCTION STUDY:
Fluoroscopy was provided for swallowing function study, which was administered by a speech pathologist.  Final results and recommendations from this study are contained within the speech pathology report.

## 2017-05-03 ENCOUNTER — Other Ambulatory Visit: Payer: Self-pay | Admitting: Nurse Practitioner

## 2017-05-03 ENCOUNTER — Ambulatory Visit
Admission: RE | Admit: 2017-05-03 | Discharge: 2017-05-03 | Disposition: A | Discharge status: Home / Self Care | Payer: Medicare Other | Source: Ambulatory Visit | Attending: Nurse Practitioner | Admitting: Nurse Practitioner

## 2017-05-03 DIAGNOSIS — R0602 Shortness of breath: Secondary | ICD-10-CM

## 2017-10-12 ENCOUNTER — Emergency Department (HOSPITAL_COMMUNITY)
Admission: EM | Admit: 2017-10-12 | Discharge: 2017-10-12 | Disposition: A | Discharge status: Home / Self Care | Payer: Medicare Other | Attending: Emergency Medicine | Admitting: Emergency Medicine

## 2017-10-12 ENCOUNTER — Encounter (HOSPITAL_COMMUNITY): Payer: Self-pay

## 2017-10-12 DIAGNOSIS — Z79899 Other long term (current) drug therapy: Secondary | ICD-10-CM | POA: Diagnosis not present

## 2017-10-12 DIAGNOSIS — I10 Essential (primary) hypertension: Secondary | ICD-10-CM | POA: Diagnosis not present

## 2017-10-12 DIAGNOSIS — L97911 Non-pressure chronic ulcer of unspecified part of right lower leg limited to breakdown of skin: Secondary | ICD-10-CM | POA: Diagnosis present

## 2017-10-12 DIAGNOSIS — Z85828 Personal history of other malignant neoplasm of skin: Secondary | ICD-10-CM | POA: Diagnosis not present

## 2017-10-12 DIAGNOSIS — Z7982 Long term (current) use of aspirin: Secondary | ICD-10-CM | POA: Insufficient documentation

## 2017-10-12 MED ORDER — CEPHALEXIN 500 MG PO CAPS: 500.0000 mg | ORAL_CAPSULE | Freq: Three times a day (TID) | ORAL | 0 refills | Status: DC

## 2017-10-12 MED ORDER — MUPIROCIN CALCIUM 2 % EX CREA: 1.0000 "application " | TOPICAL_CREAM | Freq: Two times a day (BID) | CUTANEOUS | 0 refills | Status: DC

## 2017-10-12 NOTE — ED Notes (Signed)
Bed: [facility]1 Expected date:  Expected time:  Means of arrival:  Comments: 

## 2017-10-12 NOTE — ED Triage Notes (Signed)
Pt presents with c/o wound to her right leg, possible infection. Pt reports that she was seen earlier today at [facility] and given a prescription but it was sent to the wrong state and she is unable to retrieve it and needs the prescription.

## 2017-10-12 NOTE — Discharge Instructions (Signed)
Take antibiotics as prescribed, follow-up as scheduled.

## 2017-10-12 NOTE — ED Provider Notes (Signed)
[facility] DEPT Provider Note   CSN: [id number] Arrival date & time: 10/12/17  2154     History   Chief Complaint Chief Complaint  Patient presents with  . Leg wound    HPI [name] is a 84 y.o. female.  HPI [name] is a 84 y.o. female presents to emergency department for antibiotics.  Patient with skin breakdown to the right lower leg, seen at [facility] emergency department today, prescribed Bactroban and Keflex.  Patient received Rocephin IM while there.  She states that the prescription was electronically sent to pharmacy in [place].  She states she tried to call [facility] to see if they can resend medications but they could not.  She is here just for another prescription.  She has a close follow-up in 2 days for recheck.  While in the emergency department cultures of the wound were sent.  She denies any fever or chills.  She denies any worsening symptoms.  Past Medical History:  Diagnosis Date  . Abdominal pain, other specified site   . Allergic rhinitis   . Hypertension   . Irregular heart rate   . OSA (obstructive sleep apnea)   . Skin cancer     Patient Active Problem List   Diagnosis Date Noted  . Pharyngeal dysphagia 10/11/2015  . CAP (community acquired pneumonia) 10/08/2015  . HCAP (healthcare-associated pneumonia) 10/08/2015  . Debility 10/08/2015  . Dehydration 10/08/2015  . Chest pain 10/01/2015  . Hyponatremia 10/01/2015  . Acute bacterial sinusitis 10/01/2015  . OSA on CPAP 10/01/2015  . Seizure disorder ([place]) 10/01/2015  . 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 Hypertension 05/30/2010  . IRREGULAR HEART RATE 05/30/2010  . ALLERGIC RHINITIS 05/30/2010  . SKIN CANCER, HX OF 05/30/2010  . ABDOMINAL PAIN OTHER SPECIFIED SITE 11/10/2008    Past Surgical History:  Procedure Laterality Date  . BACK SURGERY    . CHOLECYSTECTOMY    . TOTAL ABDOMINAL HYSTERECTOMY       OB History   None      Home  Medications    Prior to Admission medications   Medication Sig Start Date End Date Taking? Authorizing Provider  albuterol (PROVENTIL HFA;VENTOLIN HFA) 108 (90 Base) MCG/ACT inhaler Inhale 2 puffs into the lungs every 4 (four) hours as needed for wheezing or shortness of breath. 10/11/15   Elwin Mocha, MD  amLODipine (NORVASC) 2.5 MG tablet Take 2.5 mg by mouth daily.    [provider]  aspirin 81 MG tablet Take 81 mg by mouth every other day.     [provider]  benzonatate (TESSALON) 100 MG capsule Take 1 capsule (100 mg total) by mouth 3 (three) times daily as needed for cough. 10/11/15   Elwin Mocha, MD  Calcium-Vitamin D [phone] MG-UNIT TABS Take 1 tablet by mouth daily.    [provider]  cephALEXin (KEFLEX) 500 MG capsule Take 1 capsule (500 mg total) by mouth 3 (three) times daily. 10/12/17   Zhaire Locker, PA-C  Coenzyme Q10 60 MG TABS Take 1 tablet by mouth daily.    [provider]  fexofenadine (ALLEGRA) 180 MG tablet Take 180 mg by mouth daily.    [provider]  folic acid (FOLVITE) 1 MG tablet Take 1 mg by mouth daily.    [provider]  lisinopril (PRINIVIL,ZESTRIL) 20 MG tablet Take 20 mg by mouth daily.    [provider]  mupirocin cream (BACTROBAN) 2 % Apply 1  application topically 2 (two) times daily. 10/12/17   Cobi Aldape, Lahoma Rocker, PA-C  nitroGLYCERIN (NITROSTAT) 0.4 MG SL tablet Place 1 tablet (0.4 mg total) under the tongue every 5 (five) minutes as needed for chest pain. 10/03/15   Regalado, Belkys A, MD  Omega-3 Fatty Acids (FISH OIL) 1000 MG CAPS Take 1 capsule by mouth daily.     [provider]  oxybutynin (DITROPAN) 5 MG tablet Take 5 mg by mouth daily.    [provider]  pantoprazole (PROTONIX) 40 MG tablet Take 1 tablet (40 mg total) by mouth daily. 10/03/15   Regalado, Belkys A, MD  phenytoin (DILANTIN) 100 MG ER capsule Take 200 mg by mouth at bedtime.     [provider]    Family History Family History  Problem Relation Age of Onset  . Heart disease Father   . Skin cancer Father   . Skin cancer Mother     Social History Social History   Tobacco Use  . Smoking status: Never Smoker  . Smokeless tobacco: Never Used  Substance Use Topics  . Alcohol use: No  . Drug use: No     Allergies   Codeine; Latex; and Tape   Review of Systems Review of Systems  Constitutional: Negative for chills and fever.  Respiratory: Negative for cough, chest tightness and shortness of breath.   Cardiovascular: Positive for leg swelling. Negative for chest pain and palpitations.  Gastrointestinal: Negative for abdominal pain, diarrhea, nausea and vomiting.  Genitourinary: Negative for dysuria, flank pain and pelvic pain.  Musculoskeletal: Positive for arthralgias.  Skin: Positive for wound. Negative for rash.  Neurological: Negative for dizziness and headaches.  All other systems reviewed and are negative.    Physical Exam Updated Vital Signs BP (!) 179/67 (BP Location: Left Arm)   Pulse 68   Temp 98.4 F (36.9 C) (Oral)   Resp 18   Ht 5\' 2"  (1.575 m)   Wt 66.7 kg (147 lb)   SpO2 98%   BMI 26.89 kg/m   Physical Exam  Constitutional: She appears well-developed and well-nourished. No distress.  Eyes: Conjunctivae are normal.  Neck: Neck supple.  Musculoskeletal:  Swelling noted to right lower leg. Coban in place. Pt refuses me to unwrap it. Distally cap refill <2 sec.   Neurological: She is alert.  Skin: Skin is warm and dry.  Nursing note and vitals reviewed.    ED Treatments / Results  Labs (all labs ordered are listed, but only abnormal results are displayed) Labs Reviewed - No data to display  EKG None  Radiology No results found.  Procedures Procedures (including critical care time)  Medications Ordered in ED Medications - No data to display   Initial Impression / Assessment and Plan / ED Course  I have  reviewed the triage vital signs and the nursing notes.  Pertinent labs & imaging results that were available during my care of the patient were reviewed by me and considered in my medical decision making (see chart for details).     Pt is here for prescriptions, was seen at [facility] earlier for leg wound, started on antibiotics. Antibiotics electronically sent to [place]. Pt has no complaints. Recheck apt in 2 days.  Afebrile, nontoxic-appearing.  I will refill her prescriptions.  Return precautions discussed.  Vitals:   10/12/17 2212 10/12/17 2220  BP: (!) 179/67   Pulse: 68   Resp: 18   Temp: 98.4 F (36.9 C)   TempSrc: Oral   SpO2:  98%   Weight:  66.7 kg (147 lb)  Height:  5\' 2"  (1.575 m)     Final Clinical Impressions(s) / ED Diagnoses   Final diagnoses:  Leg ulcer, right, limited to breakdown of skin [facility])    ED Discharge Orders        Ordered    cephALEXin (KEFLEX) 500 MG capsule  3 times daily     10/12/17 2228    mupirocin cream (BACTROBAN) 2 %  2 times daily     10/12/17 2228       Jeannett Senior, PA-C 10/12/17 2240    Quintella Reichert, MD 10/13/17 1547

## 2017-11-21 DIAGNOSIS — M179 Osteoarthritis of knee, unspecified: Secondary | ICD-10-CM | POA: Insufficient documentation

## 2017-11-21 DIAGNOSIS — M1710 Unilateral primary osteoarthritis, unspecified knee: Secondary | ICD-10-CM | POA: Insufficient documentation

## 2017-11-21 DIAGNOSIS — M25559 Pain in unspecified hip: Secondary | ICD-10-CM | POA: Insufficient documentation

## 2017-12-23 DIAGNOSIS — I219 Acute myocardial infarction, unspecified: Secondary | ICD-10-CM

## 2017-12-23 DIAGNOSIS — I2510 Atherosclerotic heart disease of native coronary artery without angina pectoris: Secondary | ICD-10-CM

## 2017-12-23 DIAGNOSIS — I5030 Unspecified diastolic (congestive) heart failure: Secondary | ICD-10-CM | POA: Diagnosis present

## 2017-12-23 HISTORY — DX: Acute myocardial infarction, unspecified: I21.9

## 2017-12-23 HISTORY — DX: Atherosclerotic heart disease of native coronary artery without angina pectoris: I25.10

## 2017-12-23 HISTORY — DX: Unspecified diastolic (congestive) heart failure: I50.30

## 2018-02-21 ENCOUNTER — Ambulatory Visit: Payer: Medicare Other | Admitting: Neurology

## 2018-04-13 ENCOUNTER — Emergency Department (HOSPITAL_COMMUNITY)
Admission: EM | Admit: 2018-04-13 | Discharge: 2018-04-13 | Disposition: A | Discharge status: Home / Self Care | Payer: Medicare Other | Attending: Emergency Medicine | Admitting: Emergency Medicine

## 2018-04-13 ENCOUNTER — Encounter (HOSPITAL_COMMUNITY): Payer: Self-pay | Admitting: *Deleted

## 2018-04-13 ENCOUNTER — Emergency Department (HOSPITAL_BASED_OUTPATIENT_CLINIC_OR_DEPARTMENT_OTHER): Payer: Medicare Other

## 2018-04-13 ENCOUNTER — Other Ambulatory Visit: Payer: Self-pay

## 2018-04-13 DIAGNOSIS — M7989 Other specified soft tissue disorders: Secondary | ICD-10-CM | POA: Diagnosis not present

## 2018-04-13 DIAGNOSIS — R2241 Localized swelling, mass and lump, right lower limb: Secondary | ICD-10-CM | POA: Diagnosis present

## 2018-04-13 DIAGNOSIS — M79609 Pain in unspecified limb: Secondary | ICD-10-CM

## 2018-04-13 DIAGNOSIS — L03115 Cellulitis of right lower limb: Secondary | ICD-10-CM | POA: Diagnosis not present

## 2018-04-13 DIAGNOSIS — L0390 Cellulitis, unspecified: Secondary | ICD-10-CM

## 2018-04-13 DIAGNOSIS — I10 Essential (primary) hypertension: Secondary | ICD-10-CM | POA: Diagnosis not present

## 2018-04-13 DIAGNOSIS — Z91040 Latex allergy status: Secondary | ICD-10-CM | POA: Diagnosis not present

## 2018-04-13 DIAGNOSIS — L538 Other specified erythematous conditions: Secondary | ICD-10-CM

## 2018-04-13 DIAGNOSIS — Z79899 Other long term (current) drug therapy: Secondary | ICD-10-CM | POA: Diagnosis not present

## 2018-04-13 DIAGNOSIS — Z85828 Personal history of other malignant neoplasm of skin: Secondary | ICD-10-CM | POA: Diagnosis not present

## 2018-04-13 LAB — CBC WITH DIFFERENTIAL/PLATELET
Abs Immature Granulocytes: 0.01 10*3/uL (ref 0.00–0.07)
BASOS ABS: 0 10*3/uL (ref 0.0–0.1)
Basophils Relative: 0 %
EOS ABS: 0.3 10*3/uL (ref 0.0–0.5)
EOS PCT: 7 %
HCT: 34.4 % — ABNORMAL LOW (ref 36.0–46.0)
HEMOGLOBIN: 10.8 g/dL — AB (ref 12.0–15.0)
Immature Granulocytes: 0 %
LYMPHS PCT: 28 %
Lymphs Abs: 1.2 10*3/uL (ref 0.7–4.0)
MCH: 31.4 pg (ref 26.0–34.0)
MCHC: 31.4 g/dL (ref 30.0–36.0)
MCV: 100 fL (ref 80.0–100.0)
Monocytes Absolute: 0.5 10*3/uL (ref 0.1–1.0)
Monocytes Relative: 11 %
NRBC: 0 % (ref 0.0–0.2)
Neutro Abs: 2.3 10*3/uL (ref 1.7–7.7)
Neutrophils Relative %: 54 %
PLATELETS: 169 10*3/uL (ref 150–400)
RBC: 3.44 MIL/uL — ABNORMAL LOW (ref 3.87–5.11)
RDW: 13.2 % (ref 11.5–15.5)
WBC: 4.4 10*3/uL (ref 4.0–10.5)

## 2018-04-13 LAB — BASIC METABOLIC PANEL
Anion gap: 9 (ref 5–15)
BUN: 13 mg/dL (ref 8–23)
CHLORIDE: 106 mmol/L (ref 98–111)
CO2: 24 mmol/L (ref 22–32)
CREATININE: 0.73 mg/dL (ref 0.44–1.00)
Calcium: 9.1 mg/dL (ref 8.9–10.3)
Glucose, Bld: 100 mg/dL — ABNORMAL HIGH (ref 70–99)
POTASSIUM: 3.7 mmol/L (ref 3.5–5.1)
SODIUM: 139 mmol/L (ref 135–145)

## 2018-04-13 MED ORDER — CLINDAMYCIN PHOSPHATE 600 MG/50ML IV SOLN
600.0000 mg | Freq: Once | INTRAVENOUS | Status: AC
  Administered 2018-04-13: 600 mg via INTRAVENOUS
  Filled 2018-04-13: qty 50

## 2018-04-13 MED ORDER — DOXYCYCLINE HYCLATE 100 MG PO CAPS: 100.0000 mg | ORAL_CAPSULE | Freq: Two times a day (BID) | ORAL | 0 refills | Status: DC

## 2018-04-13 NOTE — ED Provider Notes (Signed)
[facility] DEPT Provider Note   CSN: [id number] Arrival date & time: 04/13/18  1653     History   Chief Complaint Chief Complaint  Patient presents with  . Leg Pain    HPI [name] is a 83 y.o. female with a PMHx of HTN, seizure disorder, and other conditions listed below, who presents to the ED with complaints of approximately 1 week of right leg swelling, redness, and warmth.  Patient states that she was seen on 03/19/2018 by her PCP for some sores on her right leg, she was given Keflex 500 mg twice daily for 10 days.  She states that she completed 9 days worth but missed the last day.  She states that the sores improved however 1 week ago the leg started becoming more swollen, red, and warm to the touch.  When she found the 2 pills that she missed, she started taking them 2 days ago and has taken 1 dose of Keflex 500 mg each day.  She reports 8/10 constant stinging nonradiating RLE pain that worsens with walking and improves with Tylenol.  She denies any drainage from the wounds and states that the wounds are actually healing.  Her knees had a DVT, patient denies any recent travel, surgeries, or estrogen use.  Denies any personal history of DVT.  She also denies having any fevers, chills, HA, vision changes, CP, SOB, abd pain, N/V/D/C, hematuria, dysuria, numbness, tingling, focal weakness, or any other complaints at this time.   The history is provided by the patient and medical records. No language interpreter was used.  Leg Pain   Pertinent negatives include no numbness.    Past Medical History:  Diagnosis Date  . Abdominal pain, other specified site   . Allergic rhinitis   . Hypertension   . Irregular heart rate   . OSA (obstructive sleep apnea)   . Skin cancer     Patient Active Problem List   Diagnosis Date Noted  . Pharyngeal dysphagia 10/11/2015  . CAP (community acquired pneumonia) 10/08/2015  . HCAP (healthcare-associated  pneumonia) 10/08/2015  . Debility 10/08/2015  . Dehydration 10/08/2015  . Chest pain 10/01/2015  . Hyponatremia 10/01/2015  . Acute bacterial sinusitis 10/01/2015  . OSA on CPAP 10/01/2015  . Seizure disorder ([place]) 10/01/2015  . 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 Hypertension 05/30/2010  . IRREGULAR HEART RATE 05/30/2010  . ALLERGIC RHINITIS 05/30/2010  . SKIN CANCER, HX OF 05/30/2010  . ABDOMINAL PAIN OTHER SPECIFIED SITE 11/10/2008    Past Surgical History:  Procedure Laterality Date  . BACK SURGERY    . CHOLECYSTECTOMY    . TOTAL ABDOMINAL HYSTERECTOMY       OB History   No obstetric history on file.      Home Medications    Prior to Admission medications   Medication Sig Start Date End Date Taking? Authorizing Provider  albuterol (PROVENTIL HFA;VENTOLIN HFA) 108 (90 Base) MCG/ACT inhaler Inhale 2 puffs into the lungs every 4 (four) hours as needed for wheezing or shortness of breath. 10/11/15   Elwin Mocha, MD  amLODipine (NORVASC) 2.5 MG tablet Take 2.5 mg by mouth daily.    [provider]  aspirin 81 MG tablet Take 81 mg by mouth every other day.     [provider]  benzonatate (TESSALON) 100 MG capsule Take 1 capsule (100 mg total) by mouth 3 (three) times daily as needed for cough. 10/11/15   Elwin Mocha, MD  Calcium-Vitamin D 600-125 MG-UNIT TABS Take 1 tablet by mouth daily.    [provider]  cephALEXin (KEFLEX) 500 MG capsule Take 1 capsule (500 mg total) by mouth 3 (three) times daily. 10/12/17   Kirichenko, Tatyana, PA-C  Coenzyme Q10 60 MG TABS Take 1 tablet by mouth daily.    [provider]  fexofenadine (ALLEGRA) 180 MG tablet Take 180 mg by mouth daily.    [provider]  folic acid (FOLVITE) 1 MG tablet Take 1 mg by mouth daily.    [provider]  lisinopril (PRINIVIL,ZESTRIL) 20 MG tablet Take 20 mg by mouth daily.    [provider]  mupirocin cream (BACTROBAN) 2 % Apply  1 application topically 2 (two) times daily. 10/12/17   Kirichenko, Lahoma Rocker, PA-C  nitroGLYCERIN (NITROSTAT) 0.4 MG SL tablet Place 1 tablet (0.4 mg total) under the tongue every 5 (five) minutes as needed for chest pain. 10/03/15   Regalado, Belkys A, MD  Omega-3 Fatty Acids (FISH OIL) 1000 MG CAPS Take 1 capsule by mouth daily.     [provider]  oxybutynin (DITROPAN) 5 MG tablet Take 5 mg by mouth daily.    [provider]  pantoprazole (PROTONIX) 40 MG tablet Take 1 tablet (40 mg total) by mouth daily. 10/03/15   Regalado, Belkys A, MD  phenytoin (DILANTIN) 100 MG ER capsule Take 200 mg by mouth at bedtime.     [provider]    Family History Family History  Problem Relation Age of Onset  . Heart disease Father   . Skin cancer Father   . Skin cancer Mother     Social History Social History   Tobacco Use  . Smoking status: Never Smoker  . Smokeless tobacco: Never Used  Substance Use Topics  . Alcohol use: No  . Drug use: No     Allergies   Codeine; Latex; and Tape   Review of Systems Review of Systems  Constitutional: Negative for chills and fever.  Eyes: Negative for visual disturbance.  Respiratory: Negative for shortness of breath.   Cardiovascular: Negative for chest pain.  Gastrointestinal: Negative for abdominal pain, constipation, diarrhea, nausea and vomiting.  Genitourinary: Negative for dysuria and hematuria.  Musculoskeletal: Positive for arthralgias and myalgias.  Skin: Positive for color change and wound.  Allergic/Immunologic: Negative for immunocompromised state.  Neurological: Negative for weakness, numbness and headaches.  Psychiatric/Behavioral: Negative for confusion.   All other systems reviewed and are negative for acute change except as noted in the HPI.    Physical Exam Updated Vital Signs BP (!) 163/58 (BP Location: Left Arm)   Pulse 69   Temp (!) 97.4 F (36.3 C) (Oral)   Resp 18   Ht 5' 2.5" (1.588 m)    Wt 60.3 kg   SpO2 99%   BMI 23.94 kg/m    Physical Exam Vitals signs and nursing note reviewed.  Constitutional:      General: She is not in acute distress.    Appearance: Normal appearance. She is well-developed. She is not toxic-appearing.     Comments: Afebrile, nontoxic, NAD, BP elevated similar to prior visits  HENT:     Head: Normocephalic and atraumatic.  Eyes:     General:        Right eye: No discharge.        Left eye: No discharge.     Conjunctiva/sclera: Conjunctivae normal.  Neck:     Musculoskeletal: Normal range of motion and neck supple.  Cardiovascular:     Rate and Rhythm: Normal rate and regular rhythm.     Heart sounds: Normal heart sounds, S1 normal and S2 normal. No murmur. No friction rub. No gallop.   Pulmonary:     Effort: Pulmonary effort is normal. No respiratory distress.     Breath sounds: Normal breath sounds. No decreased breath sounds, wheezing, rhonchi or rales.  Abdominal:     General: Bowel sounds are normal. There is no distension.     Palpations: Abdomen is soft. Abdomen is not rigid.     Tenderness: There is no abdominal tenderness. There is no right CVA tenderness, left CVA tenderness, guarding or rebound. Negative signs include Murphy's sign and McBurney's sign.  Musculoskeletal: Normal range of motion.     Right lower leg: She exhibits tenderness, swelling and laceration (sores/wounds).     Comments: R lower leg with multiple scabbed sores to the distal lower leg, no drainage from these areas, with mild erythema, warmth, and swelling up to the mid-calf region. Mildly TTP. No areas of fluctuance. No red streaking up the leg. FROM intact in all joints, strength/sensation grossly intact, distal pulses intact, soft compartments.   Skin:    General: Skin is warm and dry.     Findings: No rash.  Neurological:     Mental Status: She is alert and oriented to person, place, and time.     Sensory: Sensation is intact. No sensory deficit.      Motor: Motor function is intact.  Psychiatric:        Mood and Affect: Mood and affect normal.        Behavior: Behavior normal.      ED Treatments / Results  Labs (all labs ordered are listed, but only abnormal results are displayed) Labs Reviewed  BASIC METABOLIC PANEL - Abnormal; Notable for the following components:      Result Value   Glucose, Bld 100 (*)    All other components within normal limits  CBC WITH DIFFERENTIAL/PLATELET - Abnormal; Notable for the following components:   RBC 3.44 (*)    Hemoglobin 10.8 (*)    HCT 34.4 (*)    All other components within normal limits    EKG None  Radiology Vas [place] Lower Extremity Venous (dvt) (only Mc & Wl 7a-7p)  Result Date: 04/13/2018  Lower Venous Study Indications: Pain, Swelling, Erythema, and Cellulitis.  Comparison Study: No prior study Performing Technologist: Sharion Dove RVS  Examination Guidelines: A complete evaluation includes B-mode imaging, spectral Doppler, color Doppler, and power Doppler as needed of all accessible portions of each vessel. Bilateral testing is considered an integral part of a complete examination. Limited examinations for reoccurring indications may be performed as noted.  Right Venous Findings: +---------+---------------+---------+-----------+----------+---------+          CompressibilityPhasicitySpontaneityPropertiesSummary   +---------+---------------+---------+-----------+----------+---------+ CFV      Full           Yes      Yes                            +---------+---------------+---------+-----------+----------+---------+ SFJ      Full                                                   +---------+---------------+---------+-----------+----------+---------+ FV Prox  Full                                                   +---------+---------------+---------+-----------+----------+---------+  FV Mid   Full                                                    +---------+---------------+---------+-----------+----------+---------+ FV DistalFull                                                   +---------+---------------+---------+-----------+----------+---------+ PFV      Full                                                   +---------+---------------+---------+-----------+----------+---------+ POP      Full           Yes      Yes                            +---------+---------------+---------+-----------+----------+---------+ PTV      Full                                                   +---------+---------------+---------+-----------+----------+---------+ GSV      Full                                         thickened +---------+---------------+---------+-----------+----------+---------+  Left Venous Findings: +---+---------------+---------+-----------+----------+-------+    CompressibilityPhasicitySpontaneityPropertiesSummary +---+---------------+---------+-----------+----------+-------+ CFVFull           Yes      Yes                          +---+---------------+---------+-----------+----------+-------+    Summary: Right: There is no evidence of deep vein thrombosis in the lower extremity.There is no evidence of acute superficial venous thrombosis. Left: No evidence of common femoral vein obstruction.  *See table(s) above for measurements and observations.    Preliminary      DVT U/S: Preliminary report: Right lower extremity venous duplex completed.   Preliminary report:  There is no DVT or SVT noted in the right lower extremity. KANADY, CANDACE, RVT 04/13/2018, 7:29 PM    Procedures Procedures (including critical care time)  Medications Ordered in ED Medications  clindamycin (CLEOCIN) IVPB 600 mg ( Intravenous Rate/Dose Verify 04/13/18 2011)     Initial Impression / Assessment and Plan / ED Course  I have reviewed the triage vital signs and the nursing notes.  Pertinent labs & imaging  results that were available during my care of the patient were reviewed by me and considered in my medical decision making (see chart for details).     84 y.o. female here with RLE swelling/redness/warmth x1 wk. She had some sores about 3.5 weeks ago for which she received Keflex, she took a total of 9 days worth but missed the last day, she started taking 1 dose each day last 2 days because of the swelling,  redness, and warmth.  On exam, right lower leg with mild erythema, warmth, and swelling up to the midcalf, some mild sores noted to the distal leg.  No active drainage.  Mild tenderness to the leg.  Neurovascularly intact.  Will obtain labs and DVT ultrasound to rule out DVT, likely represents new cellulitis rather than a failure of therapy from the last round of abx. Will reassess once labs/US return. Discussed case with my attending Dr. Tamera Punt who agrees with plan.   7:41 PM CBC w/diff with mild stable anemia but otherwise WNL. BMP WNL. DVT U/S negative for DVT. This likely represents new cellulitis rather than failure of prior treatment. Will give one dose of IV abx, then d/c home on doxycycline. Advised tylenol/motrin/elevation/ice/heat use, and close f/up with PCP in 2-3 days for recheck. Strict return guidelines advised. I explained the diagnosis and have given explicit precautions to return to the ER including for any other new or worsening symptoms. The patient understands and accepts the medical plan as it's been dictated and I have answered their questions. Discharge instructions concerning home care and prescriptions have been given. The patient is STABLE and is discharged to home in good condition.    Final Clinical Impressions(s) / ED Diagnoses   Final diagnoses:  Cellulitis of right lower extremity    ED Discharge Orders         Ordered    doxycycline (VIBRAMYCIN) 100 MG capsule  2 times daily     04/13/18 746 Ashley Aquarius [place], [place], [place] 04/13/18 2029    Malvin Johns, MD 04/13/18 2248

## 2018-04-13 NOTE — Progress Notes (Signed)
VASCULAR LAB PRELIMINARY  PRELIMINARY  PRELIMINARY  PRELIMINARY  Right lower extremity venous duplex completed.    Preliminary report:  There is no DVT or SVT noted in the right lower extremity.  Catrice Zuleta, RVT 04/13/2018, 7:29 PM

## 2018-04-13 NOTE — ED Triage Notes (Signed)
Pt complains of redness and swelling to right lower leg. Pt has been taking cephalexin but states redness and swelling has not improved.

## 2018-04-13 NOTE — Discharge Instructions (Addendum)
Keep wounds clean and dry. Apply cool and/or warm compresses to affected area throughout the day. Elevate your leg to help with pain and swelling. Take antibiotic starting tomorrow morning (04/14/18) and take it until it is finished. Alternate between tylenol and ibuprofen for pain. Follow-up with [facility]Primary Care doctor in 2-3 days for wound recheck. Monitor area for signs of infection to include, but not limited to: increasing pain, spreading redness, drainage/pus, worsening swelling, or fevers. Return to emergency department for emergent changing or worsening symptoms.

## 2018-04-25 ENCOUNTER — Emergency Department (HOSPITAL_COMMUNITY): Payer: Medicare Other

## 2018-04-25 ENCOUNTER — Encounter (HOSPITAL_COMMUNITY): Payer: Self-pay | Admitting: Emergency Medicine

## 2018-04-25 ENCOUNTER — Observation Stay (HOSPITAL_COMMUNITY)
Admission: EM | Admit: 2018-04-25 | Discharge: 2018-04-27 | Disposition: A | Discharge status: Home / Self Care | Payer: Medicare Other | Attending: Internal Medicine | Admitting: Internal Medicine

## 2018-04-25 ENCOUNTER — Observation Stay (HOSPITAL_COMMUNITY): Payer: Medicare Other

## 2018-04-25 DIAGNOSIS — I5032 Chronic diastolic (congestive) heart failure: Secondary | ICD-10-CM | POA: Diagnosis not present

## 2018-04-25 DIAGNOSIS — I491 Atrial premature depolarization: Secondary | ICD-10-CM | POA: Insufficient documentation

## 2018-04-25 DIAGNOSIS — I071 Rheumatic tricuspid insufficiency: Secondary | ICD-10-CM | POA: Diagnosis not present

## 2018-04-25 DIAGNOSIS — R2689 Other abnormalities of gait and mobility: Secondary | ICD-10-CM | POA: Insufficient documentation

## 2018-04-25 DIAGNOSIS — W19XXXS Unspecified fall, sequela: Secondary | ICD-10-CM | POA: Diagnosis not present

## 2018-04-25 DIAGNOSIS — R531 Weakness: Secondary | ICD-10-CM | POA: Diagnosis not present

## 2018-04-25 DIAGNOSIS — M6281 Muscle weakness (generalized): Principal | ICD-10-CM | POA: Insufficient documentation

## 2018-04-25 DIAGNOSIS — J9811 Atelectasis: Secondary | ICD-10-CM | POA: Diagnosis not present

## 2018-04-25 DIAGNOSIS — R197 Diarrhea, unspecified: Secondary | ICD-10-CM | POA: Insufficient documentation

## 2018-04-25 DIAGNOSIS — I7389 Other specified peripheral vascular diseases: Secondary | ICD-10-CM | POA: Insufficient documentation

## 2018-04-25 DIAGNOSIS — I130 Hypertensive heart and chronic kidney disease with heart failure and stage 1 through stage 4 chronic kidney disease, or unspecified chronic kidney disease: Secondary | ICD-10-CM | POA: Diagnosis not present

## 2018-04-25 DIAGNOSIS — W19XXXD Unspecified fall, subsequent encounter: Secondary | ICD-10-CM | POA: Insufficient documentation

## 2018-04-25 DIAGNOSIS — Z885 Allergy status to narcotic agent status: Secondary | ICD-10-CM | POA: Insufficient documentation

## 2018-04-25 DIAGNOSIS — Z85828 Personal history of other malignant neoplasm of skin: Secondary | ICD-10-CM | POA: Insufficient documentation

## 2018-04-25 DIAGNOSIS — N183 Chronic kidney disease, stage 3 (moderate): Secondary | ICD-10-CM | POA: Diagnosis not present

## 2018-04-25 DIAGNOSIS — S2241XD Multiple fractures of ribs, right side, subsequent encounter for fracture with routine healing: Secondary | ICD-10-CM | POA: Diagnosis not present

## 2018-04-25 DIAGNOSIS — R296 Repeated falls: Secondary | ICD-10-CM | POA: Diagnosis not present

## 2018-04-25 DIAGNOSIS — Z955 Presence of coronary angioplasty implant and graft: Secondary | ICD-10-CM | POA: Insufficient documentation

## 2018-04-25 DIAGNOSIS — N179 Acute kidney failure, unspecified: Secondary | ICD-10-CM | POA: Diagnosis present

## 2018-04-25 DIAGNOSIS — I5030 Unspecified diastolic (congestive) heart failure: Secondary | ICD-10-CM

## 2018-04-25 DIAGNOSIS — Z9181 History of falling: Secondary | ICD-10-CM | POA: Diagnosis not present

## 2018-04-25 DIAGNOSIS — Z7982 Long term (current) use of aspirin: Secondary | ICD-10-CM | POA: Insufficient documentation

## 2018-04-25 DIAGNOSIS — G4733 Obstructive sleep apnea (adult) (pediatric): Secondary | ICD-10-CM | POA: Diagnosis not present

## 2018-04-25 DIAGNOSIS — I10 Essential (primary) hypertension: Secondary | ICD-10-CM | POA: Diagnosis present

## 2018-04-25 DIAGNOSIS — Z91040 Latex allergy status: Secondary | ICD-10-CM | POA: Diagnosis not present

## 2018-04-25 DIAGNOSIS — T420X5A Adverse effect of hydantoin derivatives, initial encounter: Secondary | ICD-10-CM | POA: Insufficient documentation

## 2018-04-25 DIAGNOSIS — Z9049 Acquired absence of other specified parts of digestive tract: Secondary | ICD-10-CM | POA: Insufficient documentation

## 2018-04-25 DIAGNOSIS — W19XXXA Unspecified fall, initial encounter: Secondary | ICD-10-CM

## 2018-04-25 DIAGNOSIS — I2510 Atherosclerotic heart disease of native coronary artery without angina pectoris: Secondary | ICD-10-CM | POA: Diagnosis not present

## 2018-04-25 DIAGNOSIS — I252 Old myocardial infarction: Secondary | ICD-10-CM | POA: Insufficient documentation

## 2018-04-25 DIAGNOSIS — G40909 Epilepsy, unspecified, not intractable, without status epilepticus: Secondary | ICD-10-CM | POA: Insufficient documentation

## 2018-04-25 DIAGNOSIS — Z8249 Family history of ischemic heart disease and other diseases of the circulatory system: Secondary | ICD-10-CM | POA: Insufficient documentation

## 2018-04-25 DIAGNOSIS — Z888 Allergy status to other drugs, medicaments and biological substances status: Secondary | ICD-10-CM | POA: Insufficient documentation

## 2018-04-25 DIAGNOSIS — T420X1A Poisoning by hydantoin derivatives, accidental (unintentional), initial encounter: Secondary | ICD-10-CM | POA: Diagnosis present

## 2018-04-25 DIAGNOSIS — Z9989 Dependence on other enabling machines and devices: Secondary | ICD-10-CM

## 2018-04-25 DIAGNOSIS — Z808 Family history of malignant neoplasm of other organs or systems: Secondary | ICD-10-CM | POA: Insufficient documentation

## 2018-04-25 DIAGNOSIS — N189 Chronic kidney disease, unspecified: Secondary | ICD-10-CM

## 2018-04-25 DIAGNOSIS — Z87820 Personal history of traumatic brain injury: Secondary | ICD-10-CM | POA: Insufficient documentation

## 2018-04-25 DIAGNOSIS — Z79899 Other long term (current) drug therapy: Secondary | ICD-10-CM | POA: Diagnosis not present

## 2018-04-25 HISTORY — DX: Epilepsy, unspecified, not intractable, without status epilepticus: G40.909

## 2018-04-25 HISTORY — DX: Atherosclerotic heart disease of native coronary artery without angina pectoris: I25.10

## 2018-04-25 HISTORY — DX: Unspecified diastolic (congestive) heart failure: I50.30

## 2018-04-25 LAB — COMPREHENSIVE METABOLIC PANEL
ALBUMIN: 3 g/dL — AB (ref 3.5–5.0)
ALT: 24 U/L (ref 0–44)
AST: 42 U/L — ABNORMAL HIGH (ref 15–41)
Alkaline Phosphatase: 118 U/L (ref 38–126)
Anion gap: 10 (ref 5–15)
BUN: 35 mg/dL — ABNORMAL HIGH (ref 8–23)
CHLORIDE: 107 mmol/L (ref 98–111)
CO2: 18 mmol/L — AB (ref 22–32)
CREATININE: 1.11 mg/dL — AB (ref 0.44–1.00)
Calcium: 8.9 mg/dL (ref 8.9–10.3)
GFR calc Af Amer: 52 mL/min — ABNORMAL LOW (ref >60)
GFR calc non Af Amer: 45 mL/min — ABNORMAL LOW (ref >60)
GLUCOSE: 142 mg/dL — AB (ref 70–99)
POTASSIUM: 5 mmol/L (ref 3.5–5.1)
Sodium: 135 mmol/L (ref 135–145)
Total Bilirubin: 0.4 mg/dL (ref 0.3–1.2)
Total Protein: 5.9 g/dL — ABNORMAL LOW (ref 6.5–8.1)

## 2018-04-25 LAB — URINALYSIS, ROUTINE W REFLEX MICROSCOPIC
BILIRUBIN URINE: NEGATIVE
Glucose, UA: NEGATIVE mg/dL
KETONES UR: 5 mg/dL — AB
NITRITE: POSITIVE — AB
Protein, ur: NEGATIVE mg/dL
SPECIFIC GRAVITY, URINE: 1.016 (ref 1.005–1.030)
pH: 5 (ref 5.0–8.0)

## 2018-04-25 LAB — CBC WITH DIFFERENTIAL/PLATELET
ABS IMMATURE GRANULOCYTES: 0.03 10*3/uL (ref 0.00–0.07)
BASOS ABS: 0 10*3/uL (ref 0.0–0.1)
BASOS PCT: 0 %
EOS ABS: 0.1 10*3/uL (ref 0.0–0.5)
Eosinophils Relative: 1 %
HCT: 35.1 % — ABNORMAL LOW (ref 36.0–46.0)
Hemoglobin: 10.9 g/dL — ABNORMAL LOW (ref 12.0–15.0)
IMMATURE GRANULOCYTES: 0 %
LYMPHS ABS: 1.3 10*3/uL (ref 0.7–4.0)
Lymphocytes Relative: 12 %
MCH: 30.9 pg (ref 26.0–34.0)
MCHC: 31.1 g/dL (ref 30.0–36.0)
MCV: 99.4 fL (ref 80.0–100.0)
Monocytes Absolute: 1.2 10*3/uL — ABNORMAL HIGH (ref 0.1–1.0)
Monocytes Relative: 11 %
NEUTROS ABS: 7.9 10*3/uL — AB (ref 1.7–7.7)
NEUTROS PCT: 76 %
NRBC: 0 % (ref 0.0–0.2)
PLATELETS: 168 10*3/uL (ref 150–400)
RBC: 3.53 MIL/uL — ABNORMAL LOW (ref 3.87–5.11)
RDW: 13.3 % (ref 11.5–15.5)
WBC: 10.5 10*3/uL (ref 4.0–10.5)

## 2018-04-25 LAB — PHENYTOIN LEVEL, TOTAL: Phenytoin Lvl: 28 ug/mL — ABNORMAL HIGH (ref 10.0–20.0)

## 2018-04-25 LAB — TROPONIN I: Troponin I: <0.03 ng/mL (ref <0.03)

## 2018-04-25 LAB — BRAIN NATRIURETIC PEPTIDE: B Natriuretic Peptide: 100.9 pg/mL — ABNORMAL HIGH (ref 0.0–100.0)

## 2018-04-25 MED ORDER — CLOPIDOGREL BISULFATE 75 MG PO TABS
75.0000 mg | ORAL_TABLET | Freq: Every day | ORAL | Status: DC
  Administered 2018-04-25 – 2018-04-27 (×3): 75 mg via ORAL
  Filled 2018-04-25 (×3): qty 1

## 2018-04-25 MED ORDER — ASPIRIN 81 MG PO TABS: 81.0000 mg | ORAL_TABLET | ORAL | Status: DC

## 2018-04-25 MED ORDER — ACETAMINOPHEN 325 MG PO TABS: 650.0000 mg | ORAL_TABLET | ORAL | Status: DC | PRN

## 2018-04-25 MED ORDER — AMLODIPINE BESYLATE 2.5 MG PO TABS
2.5000 mg | ORAL_TABLET | Freq: Every day | ORAL | Status: DC
  Administered 2018-04-25 – 2018-04-27 (×3): 2.5 mg via ORAL
  Filled 2018-04-25 (×3): qty 1

## 2018-04-25 MED ORDER — LEVETIRACETAM 500 MG PO TABS
500.0000 mg | ORAL_TABLET | Freq: Two times a day (BID) | ORAL | Status: DC
  Administered 2018-04-25 – 2018-04-27 (×5): 500 mg via ORAL
  Filled 2018-04-25 (×5): qty 1

## 2018-04-25 MED ORDER — OXYBUTYNIN CHLORIDE 5 MG PO TABS
5.0000 mg | ORAL_TABLET | Freq: Every day | ORAL | Status: DC
  Administered 2018-04-25 – 2018-04-27 (×3): 5 mg via ORAL
  Filled 2018-04-25 (×3): qty 1

## 2018-04-25 MED ORDER — ENOXAPARIN SODIUM 30 MG/0.3ML ~~LOC~~ SOLN
30.0000 mg | SUBCUTANEOUS | Status: DC
  Administered 2018-04-25 – 2018-04-26 (×2): 30 mg via SUBCUTANEOUS
  Filled 2018-04-25 (×3): qty 0.3

## 2018-04-25 MED ORDER — POLYETHYLENE GLYCOL 3350 17 G PO PACK: 17.0000 g | PACK | Freq: Every day | ORAL | Status: DC | PRN

## 2018-04-25 MED ORDER — DOCUSATE SODIUM 100 MG PO CAPS
100.0000 mg | ORAL_CAPSULE | Freq: Two times a day (BID) | ORAL | Status: DC
  Administered 2018-04-25 – 2018-04-27 (×5): 100 mg via ORAL
  Filled 2018-04-25 (×5): qty 1

## 2018-04-25 MED ORDER — LORAZEPAM 2 MG/ML IJ SOLN
0.5000 mg | Freq: Once | INTRAMUSCULAR | Status: AC
  Administered 2018-04-25: 0.5 mg via INTRAVENOUS
  Filled 2018-04-25: qty 1

## 2018-04-25 MED ORDER — LORATADINE 10 MG PO TABS
10.0000 mg | ORAL_TABLET | Freq: Every day | ORAL | Status: DC
  Administered 2018-04-25 – 2018-04-27 (×3): 10 mg via ORAL
  Filled 2018-04-25 (×3): qty 1

## 2018-04-25 MED ORDER — ONDANSETRON HCL 4 MG/2ML IJ SOLN: 4.0000 mg | Freq: Four times a day (QID) | INTRAMUSCULAR | Status: DC | PRN

## 2018-04-25 MED ORDER — LORAZEPAM 2 MG/ML IJ SOLN: 1.0000 mg | INTRAMUSCULAR | Status: DC | PRN

## 2018-04-25 MED ORDER — ACETAMINOPHEN 650 MG RE SUPP: 650.0000 mg | RECTAL | Status: DC | PRN

## 2018-04-25 MED ORDER — ASPIRIN 81 MG PO CHEW
81.0000 mg | CHEWABLE_TABLET | ORAL | Status: DC
  Administered 2018-04-26: 81 mg via ORAL
  Filled 2018-04-25 (×2): qty 1

## 2018-04-25 MED ORDER — SODIUM CHLORIDE 0.9 % IV SOLN
75.0000 mL/h | INTRAVENOUS | Status: DC
  Administered 2018-04-25 (×2): 75 mL/h via INTRAVENOUS

## 2018-04-25 MED ORDER — ONDANSETRON HCL 4 MG PO TABS: 4.0000 mg | ORAL_TABLET | Freq: Four times a day (QID) | ORAL | Status: DC | PRN

## 2018-04-25 MED ORDER — PANTOPRAZOLE SODIUM 40 MG PO TBEC: 40.0000 mg | DELAYED_RELEASE_TABLET | Freq: Every day | ORAL | Status: DC

## 2018-04-25 NOTE — ED Notes (Signed)
Patient transported to MRI 

## 2018-04-25 NOTE — ED Notes (Signed)
Delay in blood draw,  Pt not in room at this time.

## 2018-04-25 NOTE — Consult Note (Signed)
Neurology Consultation Reason for Consult: Falls Referring Physician: Thomasenia Bottoms  CC: Falls.   History is obtained from: Patient  HPI: [name] is a 85 y.o. female with a longstanding history of seizures managed on Dilantin for many years.  She has been falling over the past few weeks.  She states that she was changed to Vimpat after going to [facility], but was only taking it recently.  She states that she was very unsteady while taking lacosamide and therefore with her primary care changed back to Dilantin.  Since taking the Dilantin, she was doing relatively well, but then had increasing dizziness followed by increasing falls.  She fell today which prompted her to come to the emergency department where a Dilantin level was found to be 28.  I asked her if she has lost consciousness with any of her falls and she denies this, she also denies staring spells, loss time, or other episodes concerning for seizure.  She has not had a seizure in many many years.   ROS: A 14 point ROS was performed and is negative except as noted in the HPI.   Past Medical History:  Diagnosis Date  . Abdominal pain, other specified site   . Allergic rhinitis   . CAD (coronary artery disease) 12/2017   s/p stent  . Diastolic CHF ([place]) [id number]/4967   grade 1  . Hypertension   . Irregular heart rate   . OSA (obstructive sleep apnea)   . Seizure disorder ([place])   . Skin cancer      Family History  Problem Relation Age of Onset  . Heart disease Father   . Skin cancer Father   . Skin cancer Mother      Social History:  reports that she has never smoked. She has never used smokeless tobacco. She reports that she does not drink alcohol or use drugs.   Exam: Current vital signs: BP (!) 129/46 (BP Location: Right Arm)   Pulse (!) 57   Temp (!) 97.1 F (36.2 C) (Axillary)   Resp 16   SpO2 100%  Vital signs in last 24 hours: Temp:  [97.1 F (36.2 C)] 97.1 F (36.2 C) (01/02 0539) Pulse  Rate:  [48-57] 57 (01/02 1120) Resp:  [16-32] 16 (01/02 1120) BP: (129-151)/(46-93) 129/46 (01/02 1120) SpO2:  [90 %-100 %] 100 % (01/02 1120)   Physical Exam  Constitutional: Appears elderly Psych: Affect appropriate to situation Eyes: No scleral injection HENT: No OP obstrucion Head: Normocephalic.  Cardiovascular: Normal rate and regular rhythm.  Respiratory: Effort normal, non-labored breathing GI: Soft.  No distension. There is no tenderness.  Skin: WDI  Neuro: Mental Status: Patient is awake, alert, oriented to person, place, month, year, and situation. Patient is able to give a clear and coherent history. No signs of aphasia or neglect Cranial Nerves: II: Visual Fields are full. Pupils are equal, round, and reactive to light.   III,IV, VI: EOMI without ptosis or diploplia.  She does have mild end gaze nystagmus bilaterally. V: Facial sensation is symmetric to temperature VII: Facial movement is symmetric.  VIII: hearing is intact to voice X: Uvula elevates symmetrically XI: Shoulder shrug is symmetric. XII: tongue is midline without atrophy or fasciculations.  Motor: Tone is normal. Bulk is normal. 5/5 strength was present in all four extremities.  She does have mild resting tremor in her fingers Sensory: Sensation is intact light touch Cerebellar: She does have finger-nose-finger tremor bilaterally, possibly mild ataxia superimposed on  intentional tremor.  I have reviewed labs in epic and the results pertinent to this consultation are: CMP- borderline creatinine at 1.1  I have reviewed the images obtained: MRI brain - no acute findings.    Impression: 85 yo F with recurrent falls. I suspect her current imbalance is due to dilantin toxicity. With no reported seizure in many many years, I doubt that her current falls are related to this. I would normally be hesitant to stop dilantin, but she was off of it for several weeks with no breakthrough seizures, and therefore  I think it would be reasonable to start keppra and stop dilantin. This may cause sedation at first, but this tends to improve.   Recommendations: 1) Start keppra 500 BID  2) d/c dilantin.  3) Neurology will follow.    Roland Rack, MD [facility] [phone]  If 7pm- 7am, please page neurology on call as listed in [place].

## 2018-04-25 NOTE — ED Provider Notes (Signed)
[facility] EMERGENCY DEPARTMENT Provider Note   CSN: [id number] Arrival date & time: 04/25/18  0422     History   Chief Complaint Chief Complaint  Patient presents with  . Fall  . Weakness    HPI [name] is a 85 y.o. female.  Patient presents to the emergency department for evaluation after a fall.  Patient has reportedly had 2 falls in the last 24 hours.  She currently resides at home with her husband, her daughter is staying with her to help out.  Patient has had decline recently where she has become progressively more weak.  Patient now reports that she has been experiencing shortness of breath.  No fever or cough.  She denies chest pain.  Arrival to the ER patient reports that she has some minor pain in the left elbow, otherwise no pain from the fall.     Past Medical History:  Diagnosis Date  . Abdominal pain, other specified site   . Allergic rhinitis   . Hypertension   . Irregular heart rate   . OSA (obstructive sleep apnea)   . Skin cancer     Patient Active Problem List   Diagnosis Date Noted  . Pharyngeal dysphagia 10/11/2015  . CAP (community acquired pneumonia) 10/08/2015  . HCAP (healthcare-associated pneumonia) 10/08/2015  . Debility 10/08/2015  . Dehydration 10/08/2015  . Chest pain 10/01/2015  . Hyponatremia 10/01/2015  . Acute bacterial sinusitis 10/01/2015  . OSA on CPAP 10/01/2015  . Seizure disorder ([place]) 10/01/2015  . 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 Hypertension 05/30/2010  . IRREGULAR HEART RATE 05/30/2010  . ALLERGIC RHINITIS 05/30/2010  . SKIN CANCER, HX OF 05/30/2010  . ABDOMINAL PAIN OTHER SPECIFIED SITE 11/10/2008    Past Surgical History:  Procedure Laterality Date  . BACK SURGERY    . CHOLECYSTECTOMY    . TOTAL ABDOMINAL HYSTERECTOMY       OB History   No obstetric history on file.      Home Medications    Prior to Admission medications   Medication Sig Start Date End Date  Taking? Authorizing Provider  albuterol (PROVENTIL HFA;VENTOLIN HFA) 108 (90 Base) MCG/ACT inhaler Inhale 2 puffs into the lungs every 4 (four) hours as needed for wheezing or shortness of breath. 10/11/15   Elwin Mocha, MD  amLODipine (NORVASC) 2.5 MG tablet Take 2.5 mg by mouth daily.    [provider]  aspirin 81 MG tablet Take 81 mg by mouth every other day.     [provider]  benzonatate (TESSALON) 100 MG capsule Take 1 capsule (100 mg total) by mouth 3 (three) times daily as needed for cough. 10/11/15   Elwin Mocha, MD  Calcium-Vitamin D [phone] MG-UNIT TABS Take 1 tablet by mouth daily.    [provider]  cephALEXin (KEFLEX) 500 MG capsule Take 1 capsule (500 mg total) by mouth 3 (three) times daily. 10/12/17   Kirichenko, Tatyana, PA-C  Coenzyme Q10 60 MG TABS Take 1 tablet by mouth daily.    [provider]  doxycycline (VIBRAMYCIN) 100 MG capsule Take 1 capsule (100 mg total) by mouth 2 (two) times daily. One po bid x 7 days 04/13/18   Street, [place], PA-C  fexofenadine (ALLEGRA) 180 MG tablet Take 180 mg by mouth daily.    [provider]  folic acid (FOLVITE) 1 MG tablet Take 1 mg by mouth daily.    [provider]  lisinopril (PRINIVIL,ZESTRIL) 20 MG  tablet Take 20 mg by mouth daily.    [provider]  mupirocin cream (BACTROBAN) 2 % Apply 1 application topically 2 (two) times daily. 10/12/17   Kirichenko, Lahoma Rocker, PA-C  nitroGLYCERIN (NITROSTAT) 0.4 MG SL tablet Place 1 tablet (0.4 mg total) under the tongue every 5 (five) minutes as needed for chest pain. 10/03/15   Regalado, Belkys A, MD  Omega-3 Fatty Acids (FISH OIL) 1000 MG CAPS Take 1 capsule by mouth daily.     [provider]  oxybutynin (DITROPAN) 5 MG tablet Take 5 mg by mouth daily.    [provider]  pantoprazole (PROTONIX) 40 MG tablet Take 1 tablet (40 mg total) by mouth daily. 10/03/15   Regalado, Belkys A, MD  phenytoin  (DILANTIN) 100 MG ER capsule Take 200 mg by mouth at bedtime.     [provider]    Family History Family History  Problem Relation Age of Onset  . Heart disease Father   . Skin cancer Father   . Skin cancer Mother     Social History Social History   Tobacco Use  . Smoking status: Never Smoker  . Smokeless tobacco: Never Used  Substance Use Topics  . Alcohol use: No  . Drug use: No     Allergies   Codeine; Latex; and Tape   Review of Systems Review of Systems  Respiratory: Positive for shortness of breath.   Neurological: Positive for weakness.  All other systems reviewed and are negative.    Physical Exam Updated Vital Signs BP (!) 132/48   Pulse (!) 50   Temp (!) 97.1 F (36.2 C) (Axillary)   Resp (!) 32   SpO2 90%   Physical Exam Vitals signs and nursing note reviewed.  Constitutional:      General: She is not in acute distress.    Appearance: Normal appearance. She is well-developed.  HENT:     Head: Normocephalic and atraumatic.     Right Ear: Hearing normal.     Left Ear: Hearing normal.     Nose: Nose normal.  Eyes:     Conjunctiva/sclera: Conjunctivae normal.     Pupils: Pupils are equal, round, and reactive to light.  Neck:     Musculoskeletal: Normal range of motion and neck supple.  Cardiovascular:     Rate and Rhythm: Regular rhythm.     Heart sounds: S1 normal and S2 normal. No murmur. No friction rub. No gallop.   Pulmonary:     Effort: Pulmonary effort is normal. No respiratory distress.     Breath sounds: Normal breath sounds.  Chest:     Chest wall: Tenderness present.    Abdominal:     General: Bowel sounds are normal.     Palpations: Abdomen is soft.     Tenderness: There is no abdominal tenderness. There is no guarding or rebound. Negative signs include Murphy's sign and McBurney's sign.     Hernia: No hernia is present.  Musculoskeletal: Normal range of motion.     Right lower leg: 1+ Pitting Edema present.      Left lower leg: 1+ Pitting Edema present.  Skin:    General: Skin is warm and dry.     Findings: Abrasion (Left lower leg) and bruising (Multiple bruises on bilateral upper extremities, torso and neck) present.       Neurological:     Mental Status: She is alert and oriented to person, place, and time.     GCS: GCS  eye subscore is 4. GCS verbal subscore is 5. GCS motor subscore is 6.     Cranial Nerves: No cranial nerve deficit.     Sensory: No sensory deficit.     Coordination: Coordination normal.  Psychiatric:        Speech: Speech normal.        Behavior: Behavior normal.        Thought Content: Thought content normal.      ED Treatments / Results  Labs (all labs ordered are listed, but only abnormal results are displayed) Labs Reviewed  CBC WITH DIFFERENTIAL/PLATELET - Abnormal; Notable for the following components:      Result Value   RBC 3.53 (*)    Hemoglobin 10.9 (*)    HCT 35.1 (*)    Neutro Abs 7.9 (*)    Monocytes Absolute 1.2 (*)    All other components within normal limits  COMPREHENSIVE METABOLIC PANEL - Abnormal; Notable for the following components:   CO2 18 (*)    Glucose, Bld 142 (*)    BUN 35 (*)    Creatinine, Ser 1.11 (*)    Total Protein 5.9 (*)    Albumin 3.0 (*)    AST 42 (*)    GFR calc non Af Amer 45 (*)    GFR calc Af Amer 52 (*)    All other components within normal limits  BRAIN NATRIURETIC PEPTIDE - Abnormal; Notable for the following components:   B Natriuretic Peptide 100.9 (*)    All other components within normal limits  PHENYTOIN LEVEL, TOTAL - Abnormal; Notable for the following components:   Phenytoin Lvl 28.0 (*)    All other components within normal limits  TROPONIN I  URINALYSIS, ROUTINE W REFLEX MICROSCOPIC    EKG EKG Interpretation  Date/Time:  Thursday April 25 2018 05:36:18 EST Ventricular Rate:  52 PR Interval:    QRS Duration: 89 QT Interval:  505 QTC Calculation: 470 R Axis:   51 Text Interpretation:   Sinus rhythm Atrial premature complexes in couplets Probable anteroseptal infarct, old No significant change since last tracing Confirmed by Orpah Greek [phone]) on 04/25/2018 5:59:06 AM   Radiology Dg Ribs Bilateral W/chest  Result Date: 04/25/2018 CLINICAL DATA:  Status post fall. Multiple bruises about the chest. Initial encounter. EXAM: BILATERAL RIBS AND CHEST - 4+ VIEW COMPARISON:  Chest radiograph performed 05/03/2017 FINDINGS: Healing right-sided rib fractures are noted, new from 2019. No acute displaced rib fractures are seen. Minimal left basilar atelectasis is noted. No pleural effusion or pneumothorax is seen. The cardiomediastinal silhouette is borderline normal in size. IMPRESSION: Healing right-sided rib fractures, new from 2019. No acute displaced rib fracture seen. Minimal left basilar atelectasis noted. Electronically Signed   By: Garald Balding M.D.   On: 04/25/2018 06:23   Dg Pelvis 1-2 Views  Result Date: 04/25/2018 CLINICAL DATA:  Status post fall, with bilateral hip pain. Initial encounter. EXAM: PELVIS - 1-2 VIEW COMPARISON:  CT of the abdomen and pelvis performed 05/22/2008 FINDINGS: There is no evidence of fracture or dislocation. Both femoral heads are seated normally within their respective acetabula. Degenerative change is noted at the lower lumbar spine and sacroiliac joints. The visualized bowel gas pattern is grossly unremarkable in appearance. IMPRESSION: No evidence of fracture or dislocation. Electronically Signed   By: Garald Balding M.D.   On: 04/25/2018 06:23   Dg Elbow Complete Left  Result Date: 04/25/2018 CLINICAL DATA:  Status post fall, with left elbow pain, acute onset.  Initial encounter. EXAM: LEFT ELBOW - COMPLETE 3+ VIEW COMPARISON:  None. FINDINGS: There is no evidence of fracture or dislocation. Minimal calcification overlying the radial head is thought to reflect degenerative change. An apparent enthesophyte is noted at the olecranon. The  visualized joint spaces are preserved. No significant joint effusion is identified. The soft tissues are unremarkable in appearance. IMPRESSION: No evidence of fracture or dislocation. Electronically Signed   By: Garald Balding M.D.   On: 04/25/2018 06:24   Dg Elbow Complete Right  Result Date: 04/25/2018 CLINICAL DATA:  Status post fall, with right elbow pain. Initial encounter. EXAM: RIGHT ELBOW - COMPLETE 3+ VIEW COMPARISON:  None. FINDINGS: There is no evidence of fracture or dislocation. The visualized joint spaces are preserved. An enthesophyte is noted arising at the olecranon. No significant joint effusion is identified. The soft tissues are unremarkable in appearance. IMPRESSION: No evidence of fracture or dislocation. Electronically Signed   By: Garald Balding M.D.   On: 04/25/2018 06:25   Ct Head Wo Contrast  Result Date: 04/25/2018 CLINICAL DATA:  Status post 2 falls in the past 24 hours. Generalized weakness. Patient on blood thinners. EXAM: CT HEAD WITHOUT CONTRAST TECHNIQUE: Contiguous axial images were obtained from the base of the skull through the vertex without intravenous contrast. COMPARISON:  None. FINDINGS: Brain: No evidence of acute infarction, hemorrhage, hydrocephalus, extra-axial collection or mass lesion / mass effect. Prominence of the ventricles and sulci reflects mild cortical volume loss. Mild cerebellar atrophy is noted. Scattered subcortical white matter change likely reflects small vessel ischemic microangiopathy. The brainstem and fourth ventricle are within normal limits. The basal ganglia are unremarkable in appearance. The cerebral hemispheres demonstrate grossly normal gray-white differentiation. No mass effect or midline shift is seen. Vascular: No hyperdense vessel or unexpected calcification. Skull: There is no evidence of fracture; visualized osseous structures are unremarkable in appearance. Sinuses/Orbits: The orbits are within normal limits. The paranasal sinuses  and mastoid air cells are well-aerated. Other: No significant soft tissue abnormalities are seen. IMPRESSION: 1. No evidence of traumatic intracranial injury or fracture. 2. Mild cortical volume loss and scattered small vessel ischemic microangiopathy. Electronically Signed   By: Garald Balding M.D.   On: 04/25/2018 04:54    Procedures Procedures (including critical care time)  Medications Ordered in ED Medications - No data to display   Initial Impression / Assessment and Plan / ED Course  I have reviewed the triage vital signs and the nursing notes.  Pertinent labs & imaging results that were available during my care of the patient were reviewed by me and considered in my medical decision making (see chart for details).     Patient presents to the emergency department for evaluation of fall.  Patient has reportedly fallen twice in the last 24 hours.  She reports that she has been very weak and also has felt short of breath.  Patient appeared slightly anxious and agitated at arrival.  Respiratory rate was elevated but she has clear lung fields.  Chest x-ray does not show any evidence of pneumonia.  No evidence of congestive heart failure.  Patient's basic labs are unremarkable.  She underwent CT head to further evaluate after the fall.  She does take Plavix.  No evidence of intracranial injury noted.  She is not experiencing any neck or back pain to require imaging.  She did complain of elbow pain.  X-rays negative.  Patient moving bilateral lower extremities, no appreciable hip pain.  Pelvic x-ray unremarkable.  Patient is noted to have a history of seizure disorder.  She is on Dilantin.  Level is 28, consistent with Dilantin toxicity.  This is likely the cause of the patient's unsteadiness and falls.  Will admit for further management.  CRITICAL CARE Performed by: Orpah Greek   Total critical care time: 30 minutes  Critical care time was exclusive of separately billable  procedures and treating other patients.  Critical care was necessary to treat or prevent imminent or life-threatening deterioration.  Critical care was time spent personally by me on the following activities: development of treatment plan with patient and/or surrogate as well as nursing, discussions with consultants, evaluation of patient's response to treatment, examination of patient, obtaining history from patient or surrogate, ordering and performing treatments and interventions, ordering and review of laboratory studies, ordering and review of radiographic studies, pulse oximetry and re-evaluation of patient's condition.   Final Clinical Impressions(s) / ED Diagnoses   Final diagnoses:  Fall  Accidental phenytoin poisoning, initial encounter    ED Discharge Orders    None       Orpah Greek, MD 04/25/18 [phone]

## 2018-04-25 NOTE — ED Triage Notes (Signed)
Per EMS, pt from home, has had 2 falls in the past 24 hours.  Main complaint is generalized weakness and some SOB.  Last fall she fell onto her walker, denies LOC, CBG 178, heart rate 54.  On blood thinners.

## 2018-04-25 NOTE — Progress Notes (Signed)
Went to do pt's EEG - pt in MRI at the moment

## 2018-04-25 NOTE — Evaluation (Signed)
Physical Therapy Evaluation Patient Details Name: [name] MRN: [id number] DOB: 05-24-1929 Today's Date: 04/25/2018   History of Present Illness  85 yo presented to ED after 2 falls in 1 day at least in part due to dilantin toxicity. PMhx: CAD, CHF, HTN, OSA, back surgery  Clinical Impression  Pt supine on arrival and reports feeling confused since she was in [facility] after an MI a few months ago. 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 Pt currently limited by weakness and fatigue requiring assist for all mobility with assist available at home.  Pt with multiple falls with hematomas noted, decreased strength, balance and function who will benefit from acute therapy to maximize mobility, safety and independence. Recommend SNf unless pt able to have physical assist to return home with HHPT and caregivers.    Follow Up Recommendations SNF;Supervision/Assistance - 24 hour    Equipment Recommendations  None recommended by PT    Recommendations for Other Services OT consult     Precautions / Restrictions Precautions Precautions: Fall Restrictions Weight Bearing Restrictions: No      Mobility  Bed Mobility Overal bed mobility: Needs Assistance Bed Mobility: Rolling;Sidelying to Sit Rolling: Min assist Sidelying to sit: Min assist       General bed mobility comments: cues for sequence, rail assist to roll and raise trunk. Pt initially attempting to lift trunk from supine but unable   Transfers Overall transfer level: Needs assistance   Transfers: Sit to/from Stand Sit to Stand: Min assist         General transfer comment: assist to rise from bed and toilet with rail, cues for hand placement  Ambulation/Gait Ambulation/Gait assistance: Min guard Gait Distance (Feet): 80 Feet Assistive device: Rolling walker (2 wheeled) Gait Pattern/deviations: Step-through pattern;Decreased stride  length;Trunk flexed   Gait velocity interpretation: <1.8 ft/sec, indicate of risk for recurrent falls General Gait Details: pt with very kyphotic trunk with reliance on RW with decreased stride and balance. Pt able to walk limited distance but fatigued with gait, guarding for safety and balance  Stairs            Wheelchair Mobility    Modified Rankin (Stroke Patients Only)       Balance Overall balance assessment: Needs assistance   Sitting balance-Leahy Scale: Good       Standing balance-Leahy Scale: Poor Standing balance comment: bil UE support on RW in standing                             Pertinent Vitals/Pain Pain Assessment: 0-10 Pain Score: 4  Pain Location: generalized Pain Descriptors / Indicators: Discomfort Pain Intervention(s): Limited activity within patient's tolerance;Monitored during session;Repositioned    Home Living Family/patient expects to be discharged to:: Private residence Living Arrangements: Spouse/significant other Available Help at Discharge: Family;Available PRN/intermittently Type of Home: House Home Access: Stairs to enter Entrance Stairs-Rails: None Entrance Stairs-Number of Steps: 1 Home Layout: One level Home Equipment: Walker - 2 wheels;Cane - single point;Bedside commode;Tub bench Additional Comments: 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 Pt states she performs all homemaking, assists spouse with bathing dressing as well as herself  Hand Dominance        Extremity/Trunk Assessment   Upper Extremity Assessment Upper Extremity Assessment: Generalized weakness    Lower Extremity Assessment Lower Extremity Assessment: Generalized weakness    Cervical / Trunk Assessment Cervical / Trunk Assessment: Kyphotic   Communication   Communication: No difficulties  Cognition Arousal/Alertness: Awake/alert Behavior During Therapy: WFL for tasks assessed/performed Overall Cognitive Status: Impaired/Different from baseline Area of Impairment: Safety/judgement                         Safety/Judgement: Decreased awareness of deficits            General Comments      Exercises     Assessment/Plan    PT Assessment Patient needs continued PT services  PT Problem List Decreased strength;Decreased balance;Decreased activity tolerance;Decreased knowledge of use of DME;Decreased safety awareness;Decreased mobility       PT Treatment Interventions Gait training;Therapeutic activities;Stair training;Therapeutic exercise;Neuromuscular re-education;DME instruction;Functional mobility training;Balance training;Patient/family education    PT Goals (Current goals can be found in the Care Plan section)  Acute Rehab PT Goals Patient Stated Goal: return home to care for my husband PT Goal Formulation: With patient Time For Goal Achievement: 05/09/18 Potential to Achieve Goals: Fair    Frequency Min 3X/week   Barriers to discharge Decreased caregiver support pt states family can provide some help but unsure    Co-evaluation               AM-PAC PT "6 Clicks" Mobility  Outcome Measure Help needed turning from your back to your side while in a flat bed without using bedrails?: A Little Help needed moving from lying on your back to sitting on the side of a flat bed without using bedrails?: A Little Help needed moving to and from a bed to a chair (including a wheelchair)?: A Little Help needed standing up from a chair using your arms (e.g., wheelchair or bedside chair)?: A Little Help needed to walk in hospital room?: A Little Help needed climbing 3-5 steps with a railing? : A Lot 6 Click Score: 17    End of Session Equipment Utilized During Treatment: Gait belt Activity Tolerance:  Patient limited by fatigue Patient left: in chair;with call bell/phone within reach Nurse Communication: Mobility status PT Visit Diagnosis: Other abnormalities of gait and mobility (R26.89);Muscle weakness (generalized) (M62.81);History of falling (Z91.81)    Time: 1540-0867 PT Time Calculation (min) (ACUTE ONLY): 22 min   Charges:   PT Evaluation $PT Eval Moderate Complexity: 1 Mod          Chrisman, PT Acute Rehabilitation Services Pager: [phone] Office: [phone]   Asia Favata B Sharron Simpson 04/25/2018, 1:31 PM

## 2018-04-25 NOTE — H&P (Signed)
History and Physical    [name] STM:[id number] DOB: 1930/04/02 DOA: 04/25/2018  PCP: Berkley Harvey, NP Consultants:  None Patient coming from:  Home - lives with husband (dementia); NOK: Corrie Mckusick, [phone]  Chief Complaint: fall  HPI: [name] is a 85 y.o. female with medical history significant of OSA; seizure d/o; CAD s/p DES in 9/19; and HTN presenting with weakness and a fall.  She was hospitalized in September and was changed from Dilantin to [place].  She was falling and couldn't take the Vimpat; she was changed back to Dilantin.  This was changed back about about a week ago.  She took the medication last night about 10pm; the lab was drawn about 0546.  She fell yesterday AM; she does not have warning and just falls.  She has been having frequent falls since at least the time she was started on Vimpat; she thinks this was the reason.  She was feeling better after stopping the Vimpat about a week ago.  She fell again yesterday morning about 0700 and then again about 11pm.  She has no warning, just drops.  She has been feeling weak and tired.  She has been chronically tired but the weakness is different.  Mild SOB since she fell yesterday.  Dizziness yesterday and today.  +diarrhea from medication.  Eating and drinking well.  She had 3 loose stools yesterday.    During 9/19 hospitalization at [facility], she complained of Dilantin side effects and so this medication was changed to Vimpat.  As of her 12/27 PCP, both Vimpat and Dilantin are listed as current medications.  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  She has never had another tonic clonic seizure, but her hands will jump sometimes.  ED Course:  3 falls this week, 2 in 24 hours.  Appears to be due at least in part to Dilantin toxicity.  Family reports rapid and acute change from baseline. Has diffuse ecchymoses but metabolic and xray evaluation negative.  Has known seizure d/o - changed to Vimpat at [facility]  recently and unable to tolerate.  Likely ok to just observe and wash out.  Review of Systems: As per HPI; otherwise review of systems reviewed and negative.   Ambulatory Status:  Ambulates with a walker or cane  Past Medical History:  Diagnosis Date  . Abdominal pain, other specified site   . Allergic rhinitis   . CAD (coronary artery disease) 12/2017   s/p stent  . Diastolic CHF ([place]) 11/1446   grade 1  . Hypertension   . Irregular heart rate   . OSA (obstructive sleep apnea)   . Seizure disorder ([place])   . Skin cancer     Past Surgical History:  Procedure Laterality Date  . BACK SURGERY    . CHOLECYSTECTOMY    . TOTAL ABDOMINAL HYSTERECTOMY      Social History   Socioeconomic History  . Marital status: Married    Spouse name: Not on file  . Number of children: 1  . Years of education: Not on file  . Highest education level: Not on file  Occupational History  . Occupation: retired from [employer]  . Financial resource strain: Not on file  . Food insecurity:    Worry: Not on file    Inability: Not on file  . Transportation needs:    Medical: Not on file    Non-medical: Not on file  Tobacco Use  .  Smoking status: Never Smoker  . Smokeless tobacco: Never Used  Substance and Sexual Activity  . Alcohol use: No  . Drug use: No  . Sexual activity: Not on file  Lifestyle  . Physical activity:    Days per week: Not on file    Minutes per session: Not on file  . Stress: Not on file  Relationships  . Social connections:    Talks on phone: Not on file    Gets together: Not on file    Attends religious service: Not on file    Active member of club or organization: Not on file    Attends meetings of clubs or organizations: Not on file    Relationship status: Not on file  . Intimate partner violence:    Fear of current or ex partner: Not on file    Emotionally abused: Not on file    Physically abused: Not on file    Forced sexual activity:  Not on file  Other Topics Concern  . Not on file  Social History Narrative  . Not on file    Allergies  Allergen Reactions  . Codeine Hypertension    Causes a sensation of "pressure" within her head  . Latex Itching  . Tape Itching    Family History  Problem Relation Age of Onset  . Heart disease Father   . Skin cancer Father   . Skin cancer Mother     Prior to Admission medications   Medication Sig Start Date End Date Taking? Authorizing Provider  albuterol (PROVENTIL HFA;VENTOLIN HFA) 108 (90 Base) MCG/ACT inhaler Inhale 2 puffs into the lungs every 4 (four) hours as needed for wheezing or shortness of breath. 10/11/15   Elwin Mocha, MD  amLODipine (NORVASC) 2.5 MG tablet Take 2.5 mg by mouth daily.    [provider]  aspirin 81 MG tablet Take 81 mg by mouth every other day.     [provider]  benzonatate (TESSALON) 100 MG capsule Take 1 capsule (100 mg total) by mouth 3 (three) times daily as needed for cough. 10/11/15   Elwin Mocha, MD  Calcium-Vitamin D [phone] MG-UNIT TABS Take 1 tablet by mouth daily.    [provider]  cephALEXin (KEFLEX) 500 MG capsule Take 1 capsule (500 mg total) by mouth 3 (three) times daily. 10/12/17   Kirichenko, Tatyana, PA-C  Coenzyme Q10 60 MG TABS Take 1 tablet by mouth daily.    [provider]  doxycycline (VIBRAMYCIN) 100 MG capsule Take 1 capsule (100 mg total) by mouth 2 (two) times daily. One po bid x 7 days 04/13/18   Street, Price, PA-C  fexofenadine (ALLEGRA) 180 MG tablet Take 180 mg by mouth daily.    [provider]  folic acid (FOLVITE) 1 MG tablet Take 1 mg by mouth daily.    [provider]  lisinopril (PRINIVIL,ZESTRIL) 20 MG tablet Take 20 mg by mouth daily.    [provider]  mupirocin cream (BACTROBAN) 2 % Apply 1 application topically 2 (two) times daily. 10/12/17   Kirichenko, Lahoma Rocker, PA-C  nitroGLYCERIN (NITROSTAT) 0.4 MG SL tablet Place 1 tablet  (0.4 mg total) under the tongue every 5 (five) minutes as needed for chest pain. 10/03/15   Regalado, Belkys A, MD  Omega-3 Fatty Acids (FISH OIL) 1000 MG CAPS Take 1 capsule by mouth daily.     [provider]  oxybutynin (DITROPAN) 5 MG tablet Take 5 mg by mouth daily.  [provider]  pantoprazole (PROTONIX) 40 MG tablet Take 1 tablet (40 mg total) by mouth daily. 10/03/15   Regalado, Belkys A, MD  phenytoin (DILANTIN) 100 MG ER capsule Take 200 mg by mouth at bedtime.     [provider]    Physical Exam: Vitals:   04/25/18 0433 04/25/18 0539 04/25/18 0545 04/25/18 0835  BP: (!) 144/53 (!) 145/86 (!) 132/48 (!) 151/93  Pulse: (!) 48 (!) 54 (!) 50 (!) 52  Resp: (!) 25 17 (!) 32 20  Temp:  (!) 97.1 F (36.2 C)    TempSrc:  Axillary    SpO2: 100% 96% 90% 100%     General:  Appears calm and comfortable and is NAD Eyes:   EOMI, normal lids, iris; no nystagmus ENT:  grossly normal hearing, lips & tongue, mildly dry mm Neck:  no LAD, masses or thyromegaly; no carotid bruits Cardiovascular:  RR with mild bradycardia, no m/r/g. No LE edema.  Respiratory:   CTA bilaterally with no wheezes/rales/rhonchi.  Normal respiratory effort. Abdomen:  soft, NT, ND, NABS Skin:  no rash or induration seen on limited exam; diffuse ecchymoses in various stages of healing from recurrent falls Musculoskeletal:  grossly normal tone BUE/BLE, good ROM, no bony abnormality Psychiatric:  grossly normal mood and affect, speech fluent and appropriate, AOx3 Neurologic:  CN 2-12 grossly intact, moves all extremities in coordinated fashion, sensation intact    Radiological Exams on Admission: Dg Ribs Bilateral W/chest  Result Date: 04/25/2018 CLINICAL DATA:  Status post fall. Multiple bruises about the chest. Initial encounter. EXAM: BILATERAL RIBS AND CHEST - 4+ VIEW COMPARISON:  Chest radiograph performed 05/03/2017 FINDINGS: Healing right-sided rib fractures are noted, new from  2019. No acute displaced rib fractures are seen. Minimal left basilar atelectasis is noted. No pleural effusion or pneumothorax is seen. The cardiomediastinal silhouette is borderline normal in size. IMPRESSION: Healing right-sided rib fractures, new from 2019. No acute displaced rib fracture seen. Minimal left basilar atelectasis noted. Electronically Signed   By: Garald Balding M.D.   On: 04/25/2018 06:23   Dg Pelvis 1-2 Views  Result Date: 04/25/2018 CLINICAL DATA:  Status post fall, with bilateral hip pain. Initial encounter. EXAM: PELVIS - 1-2 VIEW COMPARISON:  CT of the abdomen and pelvis performed 05/22/2008 FINDINGS: There is no evidence of fracture or dislocation. Both femoral heads are seated normally within their respective acetabula. Degenerative change is noted at the lower lumbar spine and sacroiliac joints. The visualized bowel gas pattern is grossly unremarkable in appearance. IMPRESSION: No evidence of fracture or dislocation. Electronically Signed   By: Garald Balding M.D.   On: 04/25/2018 06:23   Dg Elbow Complete Left  Result Date: 04/25/2018 CLINICAL DATA:  Status post fall, with left elbow pain, acute onset. Initial encounter. EXAM: LEFT ELBOW - COMPLETE 3+ VIEW COMPARISON:  None. FINDINGS: There is no evidence of fracture or dislocation. Minimal calcification overlying the radial head is thought to reflect degenerative change. An apparent enthesophyte is noted at the olecranon. The visualized joint spaces are preserved. No significant joint effusion is identified. The soft tissues are unremarkable in appearance. IMPRESSION: No evidence of fracture or dislocation. Electronically Signed   By: Garald Balding M.D.   On: 04/25/2018 06:24   Dg Elbow Complete Right  Result Date: 04/25/2018 CLINICAL DATA:  Status post fall, with right elbow pain. Initial encounter. EXAM: RIGHT ELBOW - COMPLETE 3+ VIEW COMPARISON:  None. FINDINGS: There is no evidence of fracture or dislocation. The  visualized  joint spaces are preserved. An enthesophyte is noted arising at the olecranon. No significant joint effusion is identified. The soft tissues are unremarkable in appearance. IMPRESSION: No evidence of fracture or dislocation. Electronically Signed   By: Garald Balding M.D.   On: 04/25/2018 06:25   Ct Head Wo Contrast  Result Date: 04/25/2018 CLINICAL DATA:  Status post 2 falls in the past 24 hours. Generalized weakness. Patient on blood thinners. EXAM: CT HEAD WITHOUT CONTRAST TECHNIQUE: Contiguous axial images were obtained from the base of the skull through the vertex without intravenous contrast. COMPARISON:  None. FINDINGS: Brain: No evidence of acute infarction, hemorrhage, hydrocephalus, extra-axial collection or mass lesion / mass effect. Prominence of the ventricles and sulci reflects mild cortical volume loss. Mild cerebellar atrophy is noted. Scattered subcortical white matter change likely reflects small vessel ischemic microangiopathy. The brainstem and fourth ventricle are within normal limits. The basal ganglia are unremarkable in appearance. The cerebral hemispheres demonstrate grossly normal gray-white differentiation. No mass effect or midline shift is seen. Vascular: No hyperdense vessel or unexpected calcification. Skull: There is no evidence of fracture; visualized osseous structures are unremarkable in appearance. Sinuses/Orbits: The orbits are within normal limits. The paranasal sinuses and mastoid air cells are well-aerated. Other: No significant soft tissue abnormalities are seen. IMPRESSION: 1. No evidence of traumatic intracranial injury or fracture. 2. Mild cortical volume loss and scattered small vessel ischemic microangiopathy. Electronically Signed   By: Garald Balding M.D.   On: 04/25/2018 04:54    EKG: Independently reviewed.  NSR with rate 52; nonspecific ST changes with no evidence of acute ischemia   Labs on Admission: I have personally reviewed the available labs and  imaging studies at the time of the admission.  Pertinent labs:   CO2 18 Glucose 142 BUN 35/Creatinine 1.11/GFR 45; 13/0.73/>60 on 12/21 Albumin 3.0 AST 42/ALT 24 BNP 100.9 Troponin <0.03 WBC 10.5 Hgb 10.9 - stable Phenytoin 28.0  Assessment/Plan Principal Problem:   Weakness Active Problems:   Hypertension   OSA on CPAP   Seizure disorder (HCC)   Diastolic CHF (HCC)   CAD (coronary artery disease)   Acute kidney injury superimposed on chronic kidney disease (HCC)   Dilantin toxicity, accidental or unintentional, initial encounter   Weakness -Patient with acute on chronic weakness and recurrent falls -It is unclear how much of her presenting complaints are new because review of prior records from PCP indicates chronic weakness and falls -Patient was found at have mild AKI and also concern for Dilantin toxicity -Will observe overnight on telemetry -Will request PT consult -Of note, 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  She will be eager to return home as soon as safely possible.  Dilantin toxicity -It is not clear how clinically significant this is since the level checked is more of a peak but toxicity is diagnosed by the trough (>20) at the time of the next dose (so in this case 24 hours after the 10pm dose rather than 8 hours) -The half-life of the medication is quite variable, from 7-42 hours -However, the patient's Albumin is low and so the level is actually probably even higher -Will recheck level tonight at 10 pm for a true trough -Meanwhile, her symptoms are not severe enough to warrant consideration of HD and so will simply hold the medication and follow clinically  Seizure d/o -By the patient's report, her only lifetime tonic-clonic seizure occurred in the 1950s after a TBI occurring from a  fall  -She does report R hand jerking/tremors when she comes off seizure medication -She does not report remembering having an EEG and there is none  available in the system -She has remained on seizure medications all these years because "I can't drive if I come off the medication" -She was seen in consult at [facility] by Dr. Arnaldo Natal on 9/27 and he reports that "her seizures has been very well controlled but she does not like dilantin, dilantin level was low.  He recommended discontinuation of Dilantin "due to side effects" and starting Vimpat -There is no apparent subsequent documentation about her coming back off the Vimpat and resuming Dilantin, but the patient reports that it was related to increased falls while on the Vimpat -She reports transitioning back to Dilantin (same dose she took prior, when her levels were apparently low) about a week ago -Will obtain EEG and MRI to further assess seizure d/o as well as possible drop attacks -Neuro consult requested -Consider transition to an alternate agent such as Keppra that does not require intensive monitoring and is also generally well tolerated  AKI on Stage 3 CKD -Baseline GFR is >60 -Current GFR is 45 -Suspect mild volume depletion may also be playing a role here -She reports intermittent loose stools as a side effect from medication; she had 3 stools yesterday and may have had inadequate oral replacement -Hold lisinopril for now  CAD -9/19 NSTEMI requiring DES -Awaiting med rec but will continue meds as indicated  Grade 1 diastolic dysfunction -On Echo in 9/19 -Appears to be compensated at this time  HTN -Continue Norvasc -No BB due to bradycardia -Hold ACE due to AKI    DVT prophylaxis:  Lovenox Code Status:  Full - confirmed with patient/family Family Communication: Niece was present throughout evaluation  Disposition Plan:  Home once clinically improved Consults called: Neurology; PT  Admission status: It is my clinical opinion that referral for OBSERVATION is reasonable and necessary in this patient based on the above information provided. The aforementioned taken  together are felt to place the patient at high risk for further clinical deterioration. However it is anticipated that the patient may be medically stable for discharge from the hospital within 24 to 48 hours.    Karmen Bongo MD [facility]  If note is complete, please contact covering daytime or nighttime physician. [email or web address] Password [facility]  04/25/2018, 8:44 AM

## 2018-04-26 ENCOUNTER — Observation Stay (HOSPITAL_BASED_OUTPATIENT_CLINIC_OR_DEPARTMENT_OTHER): Payer: Medicare Other

## 2018-04-26 ENCOUNTER — Other Ambulatory Visit: Payer: Self-pay

## 2018-04-26 DIAGNOSIS — I5030 Unspecified diastolic (congestive) heart failure: Secondary | ICD-10-CM | POA: Diagnosis not present

## 2018-04-26 DIAGNOSIS — R531 Weakness: Secondary | ICD-10-CM | POA: Diagnosis not present

## 2018-04-26 DIAGNOSIS — W19XXXS Unspecified fall, sequela: Secondary | ICD-10-CM | POA: Diagnosis not present

## 2018-04-26 DIAGNOSIS — T420X1A Poisoning by hydantoin derivatives, accidental (unintentional), initial encounter: Secondary | ICD-10-CM | POA: Diagnosis not present

## 2018-04-26 DIAGNOSIS — G40909 Epilepsy, unspecified, not intractable, without status epilepticus: Secondary | ICD-10-CM | POA: Diagnosis not present

## 2018-04-26 DIAGNOSIS — I361 Nonrheumatic tricuspid (valve) insufficiency: Secondary | ICD-10-CM | POA: Diagnosis not present

## 2018-04-26 LAB — COMPREHENSIVE METABOLIC PANEL
ALT: 19 U/L (ref 0–44)
AST: 31 U/L (ref 15–41)
Albumin: 2.4 g/dL — ABNORMAL LOW (ref 3.5–5.0)
Alkaline Phosphatase: 92 U/L (ref 38–126)
Anion gap: 7 (ref 5–15)
BUN: 26 mg/dL — ABNORMAL HIGH (ref 8–23)
CO2: 20 mmol/L — ABNORMAL LOW (ref 22–32)
Calcium: 7.9 mg/dL — ABNORMAL LOW (ref 8.9–10.3)
Chloride: 108 mmol/L (ref 98–111)
Creatinine, Ser: 0.8 mg/dL (ref 0.44–1.00)
GFR calc Af Amer: >60 mL/min (ref >60)
Glucose, Bld: 126 mg/dL — ABNORMAL HIGH (ref 70–99)
Potassium: 4.1 mmol/L (ref 3.5–5.1)
Sodium: 135 mmol/L (ref 135–145)
Total Bilirubin: 0.3 mg/dL (ref 0.3–1.2)
Total Protein: 4.8 g/dL — ABNORMAL LOW (ref 6.5–8.1)

## 2018-04-26 LAB — CBC
HCT: 29.5 % — ABNORMAL LOW (ref 36.0–46.0)
Hemoglobin: 9.4 g/dL — ABNORMAL LOW (ref 12.0–15.0)
MCH: 30.9 pg (ref 26.0–34.0)
MCHC: 31.9 g/dL (ref 30.0–36.0)
MCV: 97 fL (ref 80.0–100.0)
Platelets: 137 10*3/uL — ABNORMAL LOW (ref 150–400)
RBC: 3.04 MIL/uL — AB (ref 3.87–5.11)
RDW: 13.6 % (ref 11.5–15.5)
WBC: 5.2 10*3/uL (ref 4.0–10.5)
nRBC: 0 % (ref 0.0–0.2)

## 2018-04-26 LAB — PHENYTOIN LEVEL, TOTAL: Phenytoin Lvl: 20.7 ug/mL — ABNORMAL HIGH (ref 10.0–20.0)

## 2018-04-26 LAB — VITAMIN B12: Vitamin B-12: 351 pg/mL (ref 180–914)

## 2018-04-26 LAB — ECHOCARDIOGRAM COMPLETE

## 2018-04-26 MED ORDER — SODIUM CHLORIDE 0.9 % IV SOLN
1.0000 g | INTRAVENOUS | Status: DC
  Administered 2018-04-26: 1 g via INTRAVENOUS
  Administered 2018-04-27: 09:00:00 via INTRAVENOUS
  Filled 2018-04-26 (×2): qty 10

## 2018-04-26 NOTE — Progress Notes (Signed)
Progress Note    [name]  JKK:[id number] DOB: 29-May-1929  DOA: 04/25/2018 PCP: Berkley Harvey, NP    Brief Narrative:     Medical records reviewed and are as summarized below:  [name] is an 85 y.o. female with medical history significant of OSA; seizure d/o; CAD s/p DES in 9/19; and HTN presenting with weakness and a fall.  She was hospitalized in September and was changed from Dilantin to [place].  She was falling and couldn't take the Vimpat; she was changed back to Dilantin.  This was changed back about about a week ago.  She took the medication last night about 10pm; the lab was drawn about 0546.  She fell yesterday AM; she does not have warning and just falls.  She has been having frequent falls since at least the time she was started on Vimpat; she thinks this was the reason.   Assessment/Plan:   Principal Problem:   Weakness Active Problems:   Hypertension   OSA on CPAP   Seizure disorder (HCC)   Diastolic CHF (HCC)   CAD (coronary artery disease)   Acute kidney injury superimposed on chronic kidney disease (HCC)   Dilantin toxicity, accidental or unintentional, initial encounter  Weakness -Patient with acute on chronic weakness and recurrent falls -It is unclear how much of her presenting complaints are new because review of prior records from PCP indicates chronic weakness and falls -Patient was found at have mild AKI and also concern for Dilantin toxicity and possible UTI -Of note, 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  She will be eager to return home as soon as safely possible. 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 Arnaldo Natal on 9/27 and he reports that "her seizures has been very well controlled but she does not like dilantin, dilantin level was low.  He recommended discontinuation of Dilantin "due to side effects" and starting Vimpat -There is no apparent subsequent documentation about her coming back off the Vimpat and resuming Dilantin, but the patient reports that it was related to increased falls while on the Vimpat -She reports transitioning back to Dilantin (same dose she took prior, when her levels were apparently low) about a week ago -neuro consult-- transition to keppra (may cause sleepiness) -MRI negative for CVA  AKI on Stage 3 CKD -Baseline GFR is >60 -IVF  CAD -9/19 NSTEMI requiring DES  Grade 1 diastolic dysfunction -On Echo in 9/19 -per patient was due for repeat echo s/p NSTEMI  HTN -Continue Norvasc -No BB due to bradycardia -Hold ACE due to AKI    Family Communication/Anticipated D/C date and plan/Code Status   DVT prophylaxis: Lovenox ordered. Code Status: Full Code.  Family Communication:  Disposition Plan: PT recommends SNF   Medical Consultants:    neuro  Subjective:   Was supposed to have repeat echo Feels very tired  Objective:    Vitals:   04/25/18 1744 04/25/18 2120 04/26/18 0134 04/26/18 0752  BP: (!) 108/41 (!) 118/43 (!) 129/44 (!) 144/63  Pulse: 66 70 73 64  Resp: 16 18 18 16   Temp: 97.9 F (36.6 C) 97.6 F (36.4 C) 97.6 F (36.4 C)  TempSrc: Oral Axillary Oral   SpO2: 100% 98% 97% 100%    Intake/Output Summary (Last 24 hours) at 04/26/2018 1113 Last data filed at 04/26/2018 0655 Gross per 24 hour  Intake 900 ml  Output 450 ml  Net 450 ml   There were no vitals filed for this visit.  Exam: Ill/Frail appearing rrr Bruising on chest wall and arms Sleepy but will awaken and answer simple questions +BS, soft, NT/ND  Data Reviewed:   I have personally reviewed following labs and imaging studies:  Labs: Labs show the  following:   Basic Metabolic Panel: Recent Labs  Lab 04/25/18 0546 04/26/18 0409  NA 135 135  K 5.0 4.1  CL 107 108  CO2 18* 20*  GLUCOSE 142* 126*  BUN 35* 26*  CREATININE 1.11* 0.80  CALCIUM 8.9 7.9*   GFR Estimated Creatinine Clearance: 40.9 mL/min (by C-G formula based on SCr of 0.8 mg/dL). Liver Function Tests: Recent Labs  Lab 04/25/18 0546 04/26/18 0409  AST 42* 31  ALT 24 19  ALKPHOS 118 92  BILITOT 0.4 0.3  PROT 5.9* 4.8*  ALBUMIN 3.0* 2.4*   No results for input(s): LIPASE, AMYLASE in the last 168 hours. No results for input(s): AMMONIA in the last 168 hours. Coagulation profile No results for input(s): INR, PROTIME in the last 168 hours.  CBC: Recent Labs  Lab 04/25/18 0546 04/26/18 0409  WBC 10.5 5.2  NEUTROABS 7.9*  --   HGB 10.9* 9.4*  HCT 35.1* 29.5*  MCV 99.4 97.0  PLT 168 137*   Cardiac Enzymes: Recent Labs  Lab 04/25/18 0546  TROPONINI <0.03   BNP (last 3 results) No results for input(s): PROBNP in the last 8760 hours. CBG: No results for input(s): GLUCAP in the last 168 hours. D-Dimer: No results for input(s): DDIMER in the last 72 hours. Hgb A1c: No results for input(s): HGBA1C in the last 72 hours. Lipid Profile: No results for input(s): CHOL, HDL, LDLCALC, TRIG, CHOLHDL, LDLDIRECT in the last 72 hours. Thyroid function studies: No results for input(s): TSH, T4TOTAL, T3FREE, THYROIDAB in the last 72 hours.  Invalid input(s): FREET3 Anemia work up: Recent Labs    04/26/18 [place] 351   Sepsis Labs: Recent Labs  Lab 04/25/18 0546 04/26/18 0409  WBC 10.5 5.2    Microbiology No results found for this or any previous visit (from the past 240 hour(s)).  Procedures and diagnostic studies:  Dg Ribs Bilateral W/chest  Result Date: 04/25/2018 CLINICAL DATA:  Status post fall. Multiple bruises about the chest. Initial encounter. EXAM: BILATERAL RIBS AND CHEST - 4+ VIEW COMPARISON:  Chest radiograph performed  05/03/2017 FINDINGS: Healing right-sided rib fractures are noted, new from 2019. No acute displaced rib fractures are seen. Minimal left basilar atelectasis is noted. No pleural effusion or pneumothorax is seen. The cardiomediastinal silhouette is borderline normal in size. IMPRESSION: Healing right-sided rib fractures, new from 2019. No acute displaced rib fracture seen. Minimal left basilar atelectasis noted. Electronically Signed   By: Garald Balding M.D.   On: 04/25/2018 06:23   Dg Pelvis 1-2 Views  Result Date: 04/25/2018 CLINICAL DATA:  Status post fall, with bilateral hip pain. Initial encounter. EXAM: PELVIS - 1-2 VIEW COMPARISON:  CT of the abdomen and pelvis performed 05/22/2008 FINDINGS: There is no evidence of fracture or dislocation. Both femoral heads are seated normally within their respective acetabula. Degenerative change is noted at the lower lumbar spine and sacroiliac joints. The visualized bowel gas pattern is  grossly unremarkable in appearance. IMPRESSION: No evidence of fracture or dislocation. Electronically Signed   By: Garald Balding M.D.   On: 04/25/2018 06:23   Dg Elbow Complete Left  Result Date: 04/25/2018 CLINICAL DATA:  Status post fall, with left elbow pain, acute onset. Initial encounter. EXAM: LEFT ELBOW - COMPLETE 3+ VIEW COMPARISON:  None. FINDINGS: There is no evidence of fracture or dislocation. Minimal calcification overlying the radial head is thought to reflect degenerative change. An apparent enthesophyte is noted at the olecranon. The visualized joint spaces are preserved. No significant joint effusion is identified. The soft tissues are unremarkable in appearance. IMPRESSION: No evidence of fracture or dislocation. Electronically Signed   By: Garald Balding M.D.   On: 04/25/2018 06:24   Dg Elbow Complete Right  Result Date: 04/25/2018 CLINICAL DATA:  Status post fall, with right elbow pain. Initial encounter. EXAM: RIGHT ELBOW - COMPLETE 3+ VIEW COMPARISON:   None. FINDINGS: There is no evidence of fracture or dislocation. The visualized joint spaces are preserved. An enthesophyte is noted arising at the olecranon. No significant joint effusion is identified. The soft tissues are unremarkable in appearance. IMPRESSION: No evidence of fracture or dislocation. Electronically Signed   By: Garald Balding M.D.   On: 04/25/2018 06:25   Ct Head Wo Contrast  Result Date: 04/25/2018 CLINICAL DATA:  Status post 2 falls in the past 24 hours. Generalized weakness. Patient on blood thinners. EXAM: CT HEAD WITHOUT CONTRAST TECHNIQUE: Contiguous axial images were obtained from the base of the skull through the vertex without intravenous contrast. COMPARISON:  None. FINDINGS: Brain: No evidence of acute infarction, hemorrhage, hydrocephalus, extra-axial collection or mass lesion / mass effect. Prominence of the ventricles and sulci reflects mild cortical volume loss. Mild cerebellar atrophy is noted. Scattered subcortical white matter change likely reflects small vessel ischemic microangiopathy. The brainstem and fourth ventricle are within normal limits. The basal ganglia are unremarkable in appearance. The cerebral hemispheres demonstrate grossly normal gray-white differentiation. No mass effect or midline shift is seen. Vascular: No hyperdense vessel or unexpected calcification. Skull: There is no evidence of fracture; visualized osseous structures are unremarkable in appearance. Sinuses/Orbits: The orbits are within normal limits. The paranasal sinuses and mastoid air cells are well-aerated. Other: No significant soft tissue abnormalities are seen. IMPRESSION: 1. No evidence of traumatic intracranial injury or fracture. 2. Mild cortical volume loss and scattered small vessel ischemic microangiopathy. Electronically Signed   By: Garald Balding M.D.   On: 04/25/2018 04:54   Mr Brain Wo Contrast  Result Date: 04/25/2018 CLINICAL DATA:  85 year old female with seizure disorder.  Recent falls. Muscle weakness. EXAM: MRI HEAD WITHOUT CONTRAST TECHNIQUE: Multiplanar, multiecho pulse sequences of the brain and surrounding structures were obtained without intravenous contrast. COMPARISON:  Head CT 0451 hours today. FINDINGS: Brain: No restricted diffusion or evidence of acute infarction. Perivascular spaces versus small chronic lacunar infarcts in the posterior right lentiform and the ventral right midbrain. However, no cortical encephalomalacia or chronic cerebral blood products. Mild for age scattered and patchy bilateral cerebral white matter T2 and FLAIR hyperintensity. No midline shift, mass effect, evidence of mass lesion, ventriculomegaly, extra-axial collection or acute intracranial hemorrhage. Cervicomedullary junction and pituitary are within normal limits. Thin slice coronal imaging. Symmetric hippocampal formations and temporal lobe signal which appears within normal limits. Vascular: Major intracranial vascular flow voids are preserved. Skull and upper cervical spine: Negative visible cervical spine. Normal bone marrow signal. Sinuses/Orbits: Negative orbits aside from postoperative changes to the globes.  Paranasal Visualized paranasal sinuses and mastoids are stable and well pneumatized. Other: Visible internal auditory structures appear normal. Scalp and face soft tissues appear negative. IMPRESSION: 1. No acute intracranial abnormality and largely unremarkable for age noncontrast MRI appearance of the brain. 2. Mild probable small vessel disease related signal changes with questionable involvement of the deep gray matter and midbrain. Electronically Signed   By: Genevie Ann M.D.   On: 04/25/2018 10:13    Medications:   . amLODipine  2.5 mg Oral Daily  . aspirin  81 mg Oral QODAY  . clopidogrel  75 mg Oral Daily  . docusate sodium  100 mg Oral BID  . enoxaparin (LOVENOX) injection  30 mg Subcutaneous Q24H  . levETIRAcetam  500 mg Oral BID  . loratadine  10 mg Oral Daily  .  oxybutynin  5 mg Oral Daily   Continuous Infusions: . cefTRIAXone (ROCEPHIN)  IV 1 g (04/26/18 1026)     LOS: 0 days   Geradine Girt  [facility]   *Please refer to [email or web address], password [email or web address] to get updated schedule on who will round on this patient, as hospitalists switch teams weekly. If 7PM-7AM, please contact night-coverage at [email or web address], password [email or web address] for any overnight needs.  04/26/2018, 11:13 AM

## 2018-04-26 NOTE — Progress Notes (Signed)
New order received for urine culture. Called lab - they still have urine sample from 1/2 - sent requisition for culture of that sample.    Fritz Pickerel, RN

## 2018-04-26 NOTE — Care Management Obs Status (Signed)
[facility] NOTIFICATION   Patient Details  Name: [name] MRN: [id number] Date of Birth: 17-Jan-1930   Medicare Observation Status Notification Given:  Yes    Midge Minium RN, BSN, NCM-BC, ACM-RN [phone] 04/26/2018, 3:41 PM

## 2018-04-26 NOTE — Evaluation (Signed)
Occupational Therapy Evaluation Patient Details Name: [name] MRN: [id number] DOB: 22-Feb-1930 Today's Date: 04/26/2018    History of Present Illness 85 yo presented to ED after 2 falls in 1 day at least in part due to dilantin toxicity. PMhx: CAD, CHF, HTN, OSA, back surgery   Clinical Impression   Pt admitted with above diagnoses, generalized weakness and decreased activity tolerance limiting ability to engage in BADL at desired level of ind. PTA pt cares for elderly husband with dementia, does own BADL/IADL and assists with all of his. She is adamant to get back to this. Bed mobility is completed at min A level at this time. Standing grooming completed, pt needing x1 seated rest break, noting DOE. Pt completed functional mobility to recliner at min guard level with RW to complete sponge bath. Sponge bathing completed at min guard-min A set > stand (inc assist with LEs). Toilet transfer completed at regular toilet with min A for power to stand (heavy reliance on grab bar. Pt cognition appears to be diminished in recall of recent events and awareness of current deficits. OT is recommending SNF level of care at this time, pt will likely decline for HHOT. Will continue to follow acutely to maximize BADL independence and safety.    Follow Up Recommendations  SNF;Home health OT;Other (comment)(pt likely to decline SNF)    Equipment Recommendations  None recommended by OT    Recommendations for Other Services       Precautions / Restrictions Precautions Precautions: Fall Restrictions Weight Bearing Restrictions: No      Mobility Bed Mobility Overal bed mobility: Needs Assistance Bed Mobility: Supine to Sit Rolling: Min assist         General bed mobility comments: cues for hand placemenet, used rail to rise trunk- needing min external support  Transfers Overall transfer level: Needs assistance Equipment used: Rolling walker (2 wheeled) Transfers: Sit to/from Stand Sit  to Stand: Min assist         General transfer comment: min A to power to stand from toilet and bed    Balance Overall balance assessment: Needs assistance   Sitting balance-Leahy Scale: Good     Standing balance support: Bilateral upper extremity supported;During functional activity Standing balance-Leahy Scale: Poor Standing balance comment: heavy reliance on BUE external support                            ADL either performed or assessed with clinical judgement   ADL Overall ADL's : Needs assistance/impaired Eating/Feeding: Set up;Sitting   Grooming: Min guard;Standing;Oral care Grooming Details (indicate cue type and reason): at sink, needing one standing resk break Upper Body Bathing: Set up;Sitting Upper Body Bathing Details (indicate cue type and reason): in recliner to wash face, chest, underarms/arms Lower Body Bathing: Minimal assistance Lower Body Bathing Details (indicate cue type and reason): to wash legs Upper Body Dressing : Set up;Sitting Upper Body Dressing Details (indicate cue type and reason): to don gown Lower Body Dressing: Minimal assistance;Sit to/from stand Lower Body Dressing Details (indicate cue type and reason): to don mesh panties Toilet Transfer: Minimal assistance;Regular Toilet;Grab bars;RW Toilet Transfer Details (indicate cue type and reason): min A for power to stand, heavy reliance on grab bar to push into standing Toileting- Clothing Manipulation and Hygiene: Supervision/safety;Sit to/from stand Toileting - Clothing Manipulation Details (indicate cue type and reason): to complete hygiene and to wash with wash cloth Tub/ Shower Transfer: Minimal assistance;Shower seat;Rolling walker  Functional mobility during ADLs: Min guard;Rolling walker General ADL Comments: Pt with decreased activity tolerance and generalized weakness limiting ability to complete ind BADL. Pt requiring seated rest breaks with DOE when standing at sink to  complete hygiene. Educated pt on home safety with diffiuclty of tasks, pt not quite understanding. Completed all bathing, dressing, toileting, and stand grooming this session     Vision Baseline Vision/History: Wears glasses Wears Glasses: At all times Patient Visual Report: No change from baseline       Perception     Praxis      Pertinent Vitals/Pain Pain Assessment: Faces Faces Pain Scale: Hurts little more Pain Location: generalized Pain Descriptors / Indicators: Discomfort Pain Intervention(s): Limited activity within patient's tolerance;Monitored during session;Repositioned     Hand Dominance     Extremity/Trunk Assessment Upper Extremity Assessment Upper Extremity Assessment: Generalized weakness   Lower Extremity Assessment Lower Extremity Assessment: Generalized weakness   Cervical / Trunk Assessment Cervical / Trunk Assessment: Kyphotic   Communication Communication Communication: No difficulties   Cognition Arousal/Alertness: Awake/alert Behavior During Therapy: WFL for tasks assessed/performed Overall Cognitive Status: Impaired/Different from baseline Area of Impairment: Safety/judgement;Memory                     Memory: Decreased short-term memory   Safety/Judgement: Decreased awareness of deficits     General Comments: difficulty with recall of recent events; (stating she needs an echo when one was performed)   General Comments       Exercises     Shoulder Instructions      Home Living Family/patient expects to be discharged to:: Private residence Living Arrangements: Spouse/significant other(husband with dementia) Available Help at Discharge: Family;Available PRN/intermittently Type of Home: House Home Access: Stairs to enter [employer] of Steps: 1 Entrance Stairs-Rails: None Home Layout: One level     Bathroom Shower/Tub: Tub/shower unit;Curtain   Biochemist, clinical: Standard     Home Equipment: Environmental consultant - 2  wheels;Cane - single point;Bedside commode;Tub bench          Prior Functioning/Environment Level of Independence: Independent with assistive device(s)        Comments: Pt reports doing all BADL/IADL independently and assist spouse with all tasks as well        OT Problem List: Decreased strength;Decreased activity tolerance;Decreased knowledge of use of DME or AE;Impaired balance (sitting and/or standing);Decreased safety awareness;Pain      OT Treatment/Interventions: Self-care/ADL training;DME and/or AE instruction;Therapeutic activities;Balance training;Therapeutic exercise;Patient/family education    OT Goals(Current goals can be found in the care plan section) Acute Rehab OT Goals Patient Stated Goal: go home and take care of my husband OT Goal Formulation: With patient Time For Goal Achievement: 05/10/18 Potential to Achieve Goals: Good  OT Frequency: Min 2X/week   Barriers to D/C:            Co-evaluation              AM-PAC OT "6 Clicks" Daily Activity     Outcome Measure Help from another person eating meals?: None Help from another person taking care of personal grooming?: A Little(standing) Help from another person toileting, which includes using toliet, bedpan, or urinal?: A Little Help from another person bathing (including washing, rinsing, drying)?: A Little Help from another person to put on and taking off regular upper body clothing?: A Little Help from another person to put on and taking off regular lower body clothing?: A Little 6 Click Score: 19   End  of Session Equipment Utilized During Treatment: Gait belt;Rolling walker Nurse Communication: Mobility status  Activity Tolerance: Patient tolerated treatment well Patient left: in chair;with call bell/phone within reach;with family/visitor present  OT Visit Diagnosis: Other abnormalities of gait and mobility (R26.89);Muscle weakness (generalized) (M62.81);History of falling (Z91.81);Pain Pain  - part of body: (generalized)                Time: 1829-9371 OT Time Calculation (min): 50 min Charges:  OT General Charges $OT Visit: 1 Visit OT Evaluation $OT Eval Moderate Complexity: 1 Mod OT Treatments $Self Care/Home Management : 23-37 mins  Zenovia Jarred, MSOT, OTR/L Behavioral Health OT/ Acute Relief OT [facility] Office: [phone]  Zenovia Jarred 04/26/2018, 3:47 PM

## 2018-04-26 NOTE — Clinical Social Work Note (Signed)
Clinical Social Work Assessment  Patient Details  Name: [name] MRN: [id number] Date of Birth: August 13, 1929  Date of referral:  04/26/18               Reason for consult:  Discharge Planning                Permission sought to share information with:  Case Manager Permission granted to share information::  Yes, Verbal Permission Granted  Name::        Agency::     Relationship::  Son  Contact Information:     Housing/Transportation Living arrangements for the past 2 months:  Single Family Home Source of Information:  Patient Patient Interpreter Needed:  None Criminal Activity/Legal Involvement Pertinent to Current Situation/Hospitalization:  No - Comment as needed Significant Relationships:  Adult Children, Spouse Lives with:  Spouse Do you feel safe going back to the place where you live?  No Need for family participation in patient care:  No (Coment)  Care giving concerns:    Patient was alert and orientated. Patient stated that she would like to go home and not go to a skilled nursing facility. She stated that she is the care taker for her husband whom has dementia. She stated that she has an adult son and daughter in law that are coming in from [place] to help her for the time being. She also has a niece that is at home with her husband that comes by at least twice a week to help her. Patient stated that she needs to be able to help care for her husband and she could not do that in a nursing facility. She stated the only reason that she fell was because of a change in her medication.   Patient stated she has been to [facility] in the past.   CSW placed a copy of the SNF list in the shadow chart and received permission from the patient to leave a copy at bedside incase she changed her mind.   Social Worker assessment / plan:    CSW completed the Fl2 form and met with patient at bedside. Patient declined SNF, she would like to speak with case management about home health  options. CSW spoke with case management, they will be meeting with the patient to discuss home health options.   Employment status:  Retired Nurse, adult PT Recommendations:  [place] / Referral to community resources:  [place]  Patient/Family's Response to care:  Patient is understanding about her condition. She is aware that she needs help and would like to receive home health services.   Patient/Family's Understanding of and Emotional Response to Diagnosis, Current Treatment, and Prognosis:  Patient has family that is a natural support system. No concerns have been reported.   Emotional Assessment Appearance:  Appears stated age Attitude/Demeanor/Rapport:  Engaged Affect (typically observed):  Accepting Orientation:  Oriented to Self, Oriented to Place, Oriented to  Time, Oriented to Situation Alcohol / Substance use:  Not Applicable Psych involvement (Current and /or in the community):  No (Comment)  Discharge Needs  Concerns to be addressed:  Discharge Planning Concerns Readmission within the last 30 days:  No Current discharge risk:  Dependent with Mobility Barriers to Discharge:  No Barriers Identified   Kemo Spruce B Sarp Vernier, LCSWA 04/26/2018, 1:49 PM

## 2018-04-26 NOTE — Plan of Care (Signed)
  Problem: Education: Goal: Knowledge of General Education information will improve Description Including pain rating scale, medication(s)/side effects and non-pharmacologic comfort measures Outcome: Progressing Note:  POC reviewed with pt.; pt. seem to understand some.

## 2018-04-26 NOTE — Progress Notes (Signed)
  Echocardiogram 2D Echocardiogram has been performed.  [name] L Androw 04/26/2018, 10:19 AM

## 2018-04-26 NOTE — [place] FL2 (Signed)
[place] MEDICAID FL2 LEVEL OF CARE SCREENING TOOL     IDENTIFICATION  Patient Name: [name] Birthdate: 11-27-1929 Sex: female Admission Date (Current Location): 04/25/2018  [facility] and [place] Number:  Herbalist and Address:  [facility], [place] [address] [place], [place] [address]      Provider Number: [id number]  Attending Physician Name and Address:  Geradine Girt, DO  Relative Name and Phone Number:  Laila, Myhre, [phone]    Current Level of Care: Hospital Recommended Level of Care: [place] Prior Approval Number: [id number]  Date Approved/Denied: 10/09/15 PASRR Number:    Discharge Plan: SNF    Current Diagnoses: Patient Active Problem List   Diagnosis Date Noted  . Weakness 04/25/2018  . Acute kidney injury superimposed on chronic kidney disease ([place]) 04/25/2018  . Dilantin toxicity, accidental or unintentional, initial encounter 04/25/2018  . Diastolic CHF ([place]) 00/92/3300  . CAD (coronary artery disease) 12/23/2017  . Pharyngeal dysphagia 10/11/2015  . CAP (community acquired pneumonia) 10/08/2015  . HCAP (healthcare-associated pneumonia) 10/08/2015  . Debility 10/08/2015  . Dehydration 10/08/2015  . Chest pain 10/01/2015  . Hyponatremia 10/01/2015  . Acute bacterial sinusitis 10/01/2015  . OSA on CPAP 10/01/2015  . Seizure disorder ([place]) 10/01/2015  . 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 Hypertension 05/30/2010  . IRREGULAR HEART RATE 05/30/2010  . ALLERGIC RHINITIS 05/30/2010  . SKIN CANCER, HX OF 05/30/2010  . ABDOMINAL PAIN OTHER SPECIFIED SITE 11/10/2008    Orientation RESPIRATION BLADDER Height & Weight     Self, Time, Situation, Place  Normal Incontinent(Has external catheter) Weight:   Height:  5' 2.5" (158.8 cm)  BEHAVIORAL SYMPTOMS/MOOD NEUROLOGICAL BOWEL NUTRITION STATUS      Continent Diet  AMBULATORY STATUS COMMUNICATION OF NEEDS Skin   Independent  Verbally Normal, Bruising(dry skin, ecchymosis on arm, shoulder, neck)                       Personal Care Assistance Level of Assistance  Bathing, Feeding, Dressing, Total care Bathing Assistance: Maximum assistance Feeding assistance: Independent Dressing Assistance: Maximum assistance Total Care Assistance: Maximum assistance   Functional Limitations Info  Sight, Speech, Hearing Sight Info: Adequate Hearing Info: Adequate Speech Info: Adequate    SPECIAL CARE FACTORS FREQUENCY  PT (By licensed PT), OT (By licensed OT)     PT Frequency: 5x/wk OT Frequency: 5x/wk            Contractures Contractures Info: Not present    Additional Factors Info  Allergies, Code Status Code Status Info: Full Code Allergies Info: Codeine, Latex, Tape           Current Medications (04/26/2018):  This is the current hospital active medication list Current Facility-Administered Medications  Medication Dose Route Frequency Provider Last Rate Last Dose  . acetaminophen (TYLENOL) tablet 650 mg  650 mg Oral Q4H PRN Karmen Bongo, MD       Or  . acetaminophen (TYLENOL) suppository 650 mg  650 mg Rectal Q4H PRN Karmen Bongo, MD      . amLODipine (NORVASC) tablet 2.5 mg  2.5 mg Oral Daily Karmen Bongo, MD   2.5 mg at 04/26/18 1027  . aspirin chewable tablet 81 mg  81 mg Oral Cathlean Sauer, MD   81 mg at 04/26/18 1028  . cefTRIAXone (ROCEPHIN) 1 g in sodium chloride 0.9 % 100 mL IVPB  1 g Intravenous Q24H Vann, Jessica U, DO 200  mL/hr at 04/26/18 1026 1 g at 04/26/18 1026  . clopidogrel (PLAVIX) tablet 75 mg  75 mg Oral Daily Karmen Bongo, MD   75 mg at 04/26/18 1027  . docusate sodium (COLACE) capsule 100 mg  100 mg Oral BID Karmen Bongo, MD   100 mg at 04/26/18 1028  . enoxaparin (LOVENOX) injection 30 mg  30 mg Subcutaneous Q24H Karmen Bongo, MD   30 mg at 04/25/18 2034  . levETIRAcetam (KEPPRA) tablet 500 mg  500 mg Oral BID Greta Doom, MD   500 mg at  04/26/18 1028  . loratadine (CLARITIN) tablet 10 mg  10 mg Oral Daily Karmen Bongo, MD   10 mg at 04/26/18 1028  . LORazepam (ATIVAN) injection 1-2 mg  1-2 mg Intravenous Q2H PRN Karmen Bongo, MD      . ondansetron [facility]) tablet 4 mg  4 mg Oral Q6H PRN Karmen Bongo, MD       Or  . ondansetron [facility]) injection 4 mg  4 mg Intravenous Q6H PRN Karmen Bongo, MD      . oxybutynin (DITROPAN) tablet 5 mg  5 mg Oral Daily Karmen Bongo, MD   5 mg at 04/26/18 1028  . polyethylene glycol (MIRALAX / GLYCOLAX) packet 17 g  17 g Oral Daily PRN Karmen Bongo, MD         Discharge Medications: Please see discharge summary for a list of discharge medications.  Relevant Imaging Results:  Relevant Lab Results:   Additional Information SSN: [id number]  Philippa Chester Vlasta Baskin, LCSWA

## 2018-04-26 NOTE — Progress Notes (Addendum)
NEURO HOSPITALIST PROGRESS NOTE   Subjective: Patient awake, alert in bed, NAD. States seizures started in 1952.  Exam: Vitals:   04/26/18 0134 04/26/18 0752  BP: (!) 129/44 (!) 144/63  Pulse: 73 64  Resp: 18 16  Temp: 97.6 F (36.4 C)   SpO2: 97% 100%    Physical Exam   HEENT-  Normocephalic, no lesions, without obvious abnormality.  Normal external eye and conjunctiva.   Cardiovascular- S1-S2 audible, pulses palpable throughout   Lungs-no rhonchi or wheezing noted, no excessive working breathing.  Saturations within normal limits Abdomen- All 4 quadrants palpated and nontender Extremities- Warm, dry and intact Musculoskeletal-no joint tenderness, deformity or swelling Skin-warm and dry, no hyperpigmentation, vitiligo, or suspicious lesions   Neuro:  Mental Status: Alert, oriented, thought content appropriate.  Speech fluent without evidence of aphasia.  Able to follow  commands without difficulty. Cranial Nerves: II:  Visual fields grossly normal,  III,IV, VI: ptosis not present, extra-ocular motions intact bilaterally, nystagmus with left end gaze, minimal right end gaze nystagmus noted. pupils equal, round, reactive to light and accommodation V,VII: smile symmetric, facial light touch sensation normal bilaterally VIII: hearing normal bilaterally IX,X: uvula rises symmetrically XI: bilateral shoulder shrug XII: midline tongue extension Motor: Right : Upper extremity   5/5  Left:     Upper extremity   5/5  Lower extremity   5/5   Lower extremity   5/5 Tone and bulk:normal tone throughout; no atrophy noted Sensory:  light touch intact throughout, bilaterally Gait: deferred    Medications:  Scheduled: . amLODipine  2.5 mg Oral Daily  . aspirin  81 mg Oral QODAY  . clopidogrel  75 mg Oral Daily  . docusate sodium  100 mg Oral BID  . enoxaparin (LOVENOX) injection  30 mg Subcutaneous Q24H  . levETIRAcetam  500 mg Oral BID  . loratadine  10 mg  Oral Daily  . oxybutynin  5 mg Oral Daily   Continuous: . cefTRIAXone (ROCEPHIN)  IV     CXK:[id number] **OR** acetaminophen, LORazepam, ondansetron **OR** ondansetron (ZOFRAN) IV, polyethylene glycol  Pertinent Labs/Diagnostics: 04/26/18 dilantin 20.7 UTI: + UTI  Dg Ribs Bilateral W/chest  Result Date: 04/25/2018 CLINICAL DATA:  Status post fall. Multiple bruises about the chest. Initial encounter. EXAM: BILATERAL RIBS AND CHEST - 4+ VIEW COMPARISON:  Chest radiograph performed 05/03/2017 FINDINGS: Healing right-sided rib fractures are noted, new from 2019. No acute displaced rib fractures are seen. Minimal left basilar atelectasis is noted. No pleural effusion or pneumothorax is seen. The cardiomediastinal silhouette is borderline normal in size. IMPRESSION: Healing right-sided rib fractures, new from 2019. No acute displaced rib fracture seen. Minimal left basilar atelectasis noted. Electronically Signed   By: Garald Balding M.D.   On: 04/25/2018 06:23   Dg Pelvis 1-2 Views  Result Date: 04/25/2018 CLINICAL DATA:  Status post fall, with bilateral hip pain. Initial encounter. EXAM: PELVIS - 1-2 VIEW COMPARISON:  CT of the abdomen and pelvis performed 05/22/2008 FINDINGS: There is no evidence of fracture or dislocation. Both femoral heads are seated normally within their respective acetabula. Degenerative change is noted at the lower lumbar spine and sacroiliac joints. The visualized bowel gas pattern is grossly unremarkable in appearance. IMPRESSION: No evidence of fracture or dislocation. Electronically Signed   By: Garald Balding M.D.   On: 04/25/2018 06:23   Dg Elbow Complete Left  Result  Date: 04/25/2018 CLINICAL DATA:  Status post fall, with left elbow pain, acute onset. Initial encounter. EXAM: LEFT ELBOW - COMPLETE 3+ VIEW COMPARISON:  None. FINDINGS: There is no evidence of fracture or dislocation. Minimal calcification overlying the radial head is thought to reflect degenerative  change. An apparent enthesophyte is noted at the olecranon. The visualized joint spaces are preserved. No significant joint effusion is identified. The soft tissues are unremarkable in appearance. IMPRESSION: No evidence of fracture or dislocation. Electronically Signed   By: Garald Balding M.D.   On: 04/25/2018 06:24   Dg Elbow Complete Right  Result Date: 04/25/2018 CLINICAL DATA:  Status post fall, with right elbow pain. Initial encounter. EXAM: RIGHT ELBOW - COMPLETE 3+ VIEW COMPARISON:  None. FINDINGS: There is no evidence of fracture or dislocation. The visualized joint spaces are preserved. An enthesophyte is noted arising at the olecranon. No significant joint effusion is identified. The soft tissues are unremarkable in appearance. IMPRESSION: No evidence of fracture or dislocation. Electronically Signed   By: Garald Balding M.D.   On: 04/25/2018 06:25   Ct Head Wo Contrast  Result Date: 04/25/2018 CLINICAL DATA:  Status post 2 falls in the past 24 hours. Generalized weakness. Patient on blood thinners. EXAM: CT HEAD WITHOUT CONTRAST TECHNIQUE: Contiguous axial images were obtained from the base of the skull through the vertex without intravenous contrast. COMPARISON:  None. FINDINGS: Brain: No evidence of acute infarction, hemorrhage, hydrocephalus, extra-axial collection or mass lesion / mass effect. Prominence of the ventricles and sulci reflects mild cortical volume loss. Mild cerebellar atrophy is noted. Scattered subcortical white matter change likely reflects small vessel ischemic microangiopathy. The brainstem and fourth ventricle are within normal limits. The basal ganglia are unremarkable in appearance. The cerebral hemispheres demonstrate grossly normal gray-white differentiation. No mass effect or midline shift is seen. Vascular: No hyperdense vessel or unexpected calcification. Skull: There is no evidence of fracture; visualized osseous structures are unremarkable in appearance.  Sinuses/Orbits: The orbits are within normal limits. The paranasal sinuses and mastoid air cells are well-aerated. Other: No significant soft tissue abnormalities are seen. IMPRESSION: 1. No evidence of traumatic intracranial injury or fracture. 2. Mild cortical volume loss and scattered small vessel ischemic microangiopathy. Electronically Signed   By: Garald Balding M.D.   On: 04/25/2018 04:54   Mr Brain Wo Contrast  Result Date: 04/25/2018 CLINICAL DATA:  85 year old female with seizure disorder. Recent falls. Muscle weakness. EXAM: MRI HEAD WITHOUT CONTRAST TECHNIQUE: Multiplanar, multiecho pulse sequences of the brain and surrounding structures were obtained without intravenous contrast. COMPARISON:  Head CT 0451 hours today. FINDINGS: Brain: No restricted diffusion or evidence of acute infarction. Perivascular spaces versus small chronic lacunar infarcts in the posterior right lentiform and the ventral right midbrain. However, no cortical encephalomalacia or chronic cerebral blood products. Mild for age scattered and patchy bilateral cerebral white matter T2 and FLAIR hyperintensity. No midline shift, mass effect, evidence of mass lesion, ventriculomegaly, extra-axial collection or acute intracranial hemorrhage. Cervicomedullary junction and pituitary are within normal limits. Thin slice coronal imaging. Symmetric hippocampal formations and temporal lobe signal which appears within normal limits. Vascular: Major intracranial vascular flow voids are preserved. Skull and upper cervical spine: Negative visible cervical spine. Normal bone marrow signal. Sinuses/Orbits: Negative orbits aside from postoperative changes to the globes. Paranasal Visualized paranasal sinuses and mastoids are stable and well pneumatized. Other: Visible internal auditory structures appear normal. Scalp and face soft tissues appear negative. IMPRESSION: 1. No acute intracranial abnormality and largely  unremarkable for age noncontrast  MRI appearance of the brain. 2. Mild probable small vessel disease related signal changes with questionable involvement of the deep gray matter and midbrain. Electronically Signed   By: Genevie Ann M.D.   On: 04/25/2018 10:13   Impression: 85 yo F with recurrent falls. I suspect her current imbalance is due to dilantin toxicity. With no reported seizure in many many years, I doubt that her current falls are related to this. I would normally be hesitant to stop dilantin, but she was off of it for several weeks with no breakthrough seizures, and therefore I think it would be reasonable to start keppra and stop dilantin. This may cause sedation at first, but this tends to improve.   Recommendations: --continue keppra 500 BID  --treat UTI -- seizure precautions --Neurology will sign-off at this time. Please page with any concerns.  --Per [facility] statutes, patients with seizures are not allowed to drive until  they have been seizure-free for six months. Use caution when using heavy equipment or power tools. Avoid working on ladders or at heights. Take showers instead of baths. Ensure the water temperature is not too high on the home water heater. Do not go swimming alone. When caring for infants or small children, sit down when holding, feeding, or changing them to minimize risk of injury to the child in the event you have a seizure.   Also, Maintain good sleep hygiene. Avoid alcohol.    Laurey Morale, MSN, NP-C [facility] [phone]  Attending neurologist's note to follow Attending addendum I have seen and examined the patient Agree with the history and physical above. I have reviewed the images independently.  MRI of the brain with no acute intracranial abnormality. Please call neurology with questions.  04/26/2018, 9:17 AM  -- Amie Portland, MD [facility] Pager: [phone] If 7pm to 7am, please call on call as listed on AMION.

## 2018-04-26 NOTE — Care Management Note (Signed)
Case Management Note  Patient Details  Name: [name] MRN: [id number] Date of Birth: 1929/08/03  Subjective/Objective:  85 yo female presented after 2 falls d/t dilantin toxicity                 Action/Plan: CM met with patient to discuss dispositional needs. 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 PCP verified as: Eldridge Abrahams, FNP; pharmacy of choice: [facility]. CM discuss PT/OT recommendations for ST SNF, with patient declining and opting to return home to care for her spouse with [facility] services. CMS HH compare list provided with [facility] selected. [place] referral given to [place], [facility] liaison; AVS updated. Patients son is traveling from [place] to assist patient/spouse post transition. No further needs from CM.   Expected Discharge Date:                  Expected Discharge Plan:  [place]  In-House Referral:  Clinical Social Work  Discharge planning Services  CM Consult  Post Acute Care Choice:  Home Health Choice offered to:  Patient  DME Arranged:  N/A DME Agency:  NA  HH Arranged:  RN, Disease Management, PT, OT, Nurse's Aide, Social Work, Refused SNF [place] Agency:  [place]  Status of Service:  In process, will continue to follow  If discussed at Long Length of Stay Meetings, dates discussed:    Additional Comments:  Midge Minium RN, BSN, NCM-BC, ACM-RN [phone] 04/26/2018, 3:58 PM

## 2018-04-27 DIAGNOSIS — N189 Chronic kidney disease, unspecified: Secondary | ICD-10-CM

## 2018-04-27 DIAGNOSIS — N179 Acute kidney failure, unspecified: Secondary | ICD-10-CM | POA: Diagnosis not present

## 2018-04-27 DIAGNOSIS — T420X1A Poisoning by hydantoin derivatives, accidental (unintentional), initial encounter: Secondary | ICD-10-CM | POA: Diagnosis not present

## 2018-04-27 LAB — BASIC METABOLIC PANEL
Anion gap: 5 (ref 5–15)
BUN: 16 mg/dL (ref 8–23)
CALCIUM: 8.3 mg/dL — AB (ref 8.9–10.3)
CO2: 23 mmol/L (ref 22–32)
Chloride: 107 mmol/L (ref 98–111)
Creatinine, Ser: 0.75 mg/dL (ref 0.44–1.00)
GFR calc non Af Amer: >60 mL/min (ref >60)
Glucose, Bld: 94 mg/dL (ref 70–99)
Potassium: 4.5 mmol/L (ref 3.5–5.1)
Sodium: 135 mmol/L (ref 135–145)

## 2018-04-27 LAB — CBC
HCT: 29.8 % — ABNORMAL LOW (ref 36.0–46.0)
Hemoglobin: 9.6 g/dL — ABNORMAL LOW (ref 12.0–15.0)
MCH: 31.5 pg (ref 26.0–34.0)
MCHC: 32.2 g/dL (ref 30.0–36.0)
MCV: 97.7 fL (ref 80.0–100.0)
Platelets: 120 10*3/uL — ABNORMAL LOW (ref 150–400)
RBC: 3.05 MIL/uL — ABNORMAL LOW (ref 3.87–5.11)
RDW: 13.7 % (ref 11.5–15.5)
WBC: 6.3 10*3/uL (ref 4.0–10.5)
nRBC: 0 % (ref 0.0–0.2)

## 2018-04-27 LAB — URINE CULTURE

## 2018-04-27 MED ORDER — LEVETIRACETAM 500 MG PO TABS: 500.0000 mg | ORAL_TABLET | Freq: Two times a day (BID) | ORAL | 0 refills | Status: DC

## 2018-04-27 MED ORDER — CEFUROXIME AXETIL 500 MG PO TABS: 500.0000 mg | ORAL_TABLET | Freq: Two times a day (BID) | ORAL | 0 refills | Status: DC

## 2018-04-27 MED ORDER — CEFUROXIME AXETIL 500 MG PO TABS: 500.0000 mg | ORAL_TABLET | Freq: Two times a day (BID) | ORAL | 0 refills | Status: AC

## 2018-04-27 NOTE — Plan of Care (Signed)
  Problem: Clinical Measurements: Goal: Will remain free from infection Outcome: Progressing Note:  IV antibiotic given as ordered.

## 2018-04-27 NOTE — Discharge Summary (Addendum)
Physician Discharge Summary  [name] JIR:[id number] DOB: June 21, 1929 DOA: 04/25/2018  PCP: Berkley Harvey, NP  Admit date: 04/25/2018 Discharge date: 04/27/2018  Admitted From: home Discharge disposition: home   Recommendations for Outpatient Follow-Up:   1. maxed out home health-- refused SNF-- needs more family involvement 2. Cbc, bmp 1 week   Discharge Diagnosis:   Principal Problem:   Weakness Active Problems:   Hypertension   OSA on CPAP   Seizure disorder (HCC)   Diastolic CHF (HCC)   CAD (coronary artery disease)   Acute kidney injury superimposed on chronic kidney disease (HCC)   Dilantin toxicity, accidental or unintentional, initial encounter    Discharge Condition: Improved.  Diet recommendation:  Regular.  Wound care: None.  Code status: Full.   History of Present Illness:   [name] is a 85 y.o. female with medical history significant of OSA; seizure d/o; CAD s/p DES in 9/19; and HTN presenting with weakness and a fall.  She was hospitalized in September and was changed from Dilantin to [place].  She was falling and couldn't take the Vimpat; she was changed back to Dilantin.  This was changed back about about a week ago.  She took the medication last night about 10pm; the lab was drawn about 0546.  She fell yesterday AM; she does not have warning and just falls.  She has been having frequent falls since at least the time she was started on Vimpat; she thinks this was the reason.  She was feeling better after stopping the Vimpat about a week ago.  She fell again yesterday morning about 0700 and then again about 11pm.  She has no warning, just drops.  She has been feeling weak and tired.  She has been chronically tired but the weakness is different.  Mild SOB since she fell yesterday.  Dizziness yesterday and today.  +diarrhea from medication.  Eating and drinking well.  She had 3 loose stools yesterday.     Hospital Course by Problem:    Weakness -Patient with acute on chronic weakness and recurrent falls -It is unclear how much of her presenting complaints are new because review of prior records from PCP indicates chronic weakness and falls -Patient was found at have mild AKI and also concern for Dilantin toxicity and possible UTI -Of note, 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 She will be eager to return home as soon as safely possible. 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 Arnaldo Natal on 9/27 and he reports that "her seizures has been very well controlled but she does not like dilantin, dilantin level was low. He recommended discontinuation of Dilantin "due to side effects" and starting Vimpat -There is no apparent subsequent documentation about her coming back off the Vimpat and resuming Dilantin, but the patient reports that it was related to increased falls while on the Vimpat -She reports transitioning back to Dilantin (same dose she took prior, when her levels were apparently low) about a week ago -neuro consult-- transition to keppra (may cause sleepiness in the beginning) -MRI negative  for CVA  AKI on Stage 3 CKD -Baseline GFR is >60 -IVF- improved  CAD -9/19 NSTEMI requiring DES  Grade 1 diastolic dysfunction -On Echo in 9/19 -per patient was due for repeat echo s/p NSTEMI  HTN -Continue Norvasc -No BB due to bradycardia -Hold ACE due to AKI     Medical Consultants:   renal   Discharge Exam:   Vitals:   04/26/18 2357 04/27/18 0604  BP: (!) 129/54 (!) 139/51  Pulse: 84 78  Resp:  18 16  Temp: 98.5 F (36.9 C) 97.8 F (36.6 C)  SpO2: 97% 97%   Vitals:   04/26/18 1317 04/26/18 1836 04/26/18 2357 04/27/18 0604  BP: (!) 115/47 (!) 158/63 (!) 129/54 (!) 139/51  Pulse: 70 76 84 78  Resp: 15 20 18 16   Temp: 97.8 F (36.6 C) 98.5 F (36.9 C) 98.5 F (36.9 C) 97.8 F (36.6 C)  TempSrc: Oral Oral Oral Oral  SpO2: 100% 100% 97% 97%  Height:        General exam: Appears calm and comfortable.    The results of significant diagnostics from this hospitalization (including imaging, microbiology, ancillary and laboratory) are listed below for reference.     Procedures and Diagnostic Studies:   Dg Ribs Bilateral W/chest  Result Date: 04/25/2018 CLINICAL DATA:  Status post fall. Multiple bruises about the chest. Initial encounter. EXAM: BILATERAL RIBS AND CHEST - 4+ VIEW COMPARISON:  Chest radiograph performed 05/03/2017 FINDINGS: Healing right-sided rib fractures are noted, new from 2019. No acute displaced rib fractures are seen. Minimal left basilar atelectasis is noted. No pleural effusion or pneumothorax is seen. The cardiomediastinal silhouette is borderline normal in size. IMPRESSION: Healing right-sided rib fractures, new from 2019. No acute displaced rib fracture seen. Minimal left basilar atelectasis noted. Electronically Signed   By: Garald Balding M.D.   On: 04/25/2018 06:23   Dg Pelvis 1-2 Views  Result Date: 04/25/2018 CLINICAL DATA:  Status post fall, with bilateral hip pain. Initial encounter. EXAM: PELVIS - 1-2 VIEW COMPARISON:  CT of the abdomen and pelvis performed 05/22/2008 FINDINGS: There is no evidence of fracture or dislocation. Both femoral heads are seated normally within their respective acetabula. Degenerative change is noted at the lower lumbar spine and sacroiliac joints. The visualized bowel gas pattern is grossly unremarkable in appearance. IMPRESSION: No evidence of fracture or dislocation. Electronically Signed   By: Garald Balding M.D.   On:  04/25/2018 06:23   Dg Elbow Complete Left  Result Date: 04/25/2018 CLINICAL DATA:  Status post fall, with left elbow pain, acute onset. Initial encounter. EXAM: LEFT ELBOW - COMPLETE 3+ VIEW COMPARISON:  None. FINDINGS: There is no evidence of fracture or dislocation. Minimal calcification overlying the radial head is thought to reflect degenerative change. An apparent enthesophyte is noted at the olecranon. The visualized joint spaces are preserved. No significant joint effusion is identified. The soft tissues are unremarkable in appearance. IMPRESSION: No evidence of fracture or dislocation. Electronically Signed   By: Garald Balding M.D.   On: 04/25/2018 06:24   Dg Elbow Complete Right  Result Date: 04/25/2018 CLINICAL DATA:  Status post fall, with right elbow pain. Initial encounter. EXAM: RIGHT ELBOW - COMPLETE 3+ VIEW COMPARISON:  None. FINDINGS: There is no evidence of fracture or dislocation. The visualized joint spaces are preserved. An enthesophyte is noted arising at the olecranon. No significant joint effusion is identified. The soft tissues are unremarkable in appearance. IMPRESSION: No evidence of fracture or dislocation. Electronically  Signed   By: Garald Balding M.D.   On: 04/25/2018 06:25   Ct Head Wo Contrast  Result Date: 04/25/2018 CLINICAL DATA:  Status post 2 falls in the past 24 hours. Generalized weakness. Patient on blood thinners. EXAM: CT HEAD WITHOUT CONTRAST TECHNIQUE: Contiguous axial images were obtained from the base of the skull through the vertex without intravenous contrast. COMPARISON:  None. FINDINGS: Brain: No evidence of acute infarction, hemorrhage, hydrocephalus, extra-axial collection or mass lesion / mass effect. Prominence of the ventricles and sulci reflects mild cortical volume loss. Mild cerebellar atrophy is noted. Scattered subcortical white matter change likely reflects small vessel ischemic microangiopathy. The brainstem and fourth ventricle are within  normal limits. The basal ganglia are unremarkable in appearance. The cerebral hemispheres demonstrate grossly normal gray-white differentiation. No mass effect or midline shift is seen. Vascular: No hyperdense vessel or unexpected calcification. Skull: There is no evidence of fracture; visualized osseous structures are unremarkable in appearance. Sinuses/Orbits: The orbits are within normal limits. The paranasal sinuses and mastoid air cells are well-aerated. Other: No significant soft tissue abnormalities are seen. IMPRESSION: 1. No evidence of traumatic intracranial injury or fracture. 2. Mild cortical volume loss and scattered small vessel ischemic microangiopathy. Electronically Signed   By: Garald Balding M.D.   On: 04/25/2018 04:54   Mr Brain Wo Contrast  Result Date: 04/25/2018 CLINICAL DATA:  85 year old female with seizure disorder. Recent falls. Muscle weakness. EXAM: MRI HEAD WITHOUT CONTRAST TECHNIQUE: Multiplanar, multiecho pulse sequences of the brain and surrounding structures were obtained without intravenous contrast. COMPARISON:  Head CT 0451 hours today. FINDINGS: Brain: No restricted diffusion or evidence of acute infarction. Perivascular spaces versus small chronic lacunar infarcts in the posterior right lentiform and the ventral right midbrain. However, no cortical encephalomalacia or chronic cerebral blood products. Mild for age scattered and patchy bilateral cerebral white matter T2 and FLAIR hyperintensity. No midline shift, mass effect, evidence of mass lesion, ventriculomegaly, extra-axial collection or acute intracranial hemorrhage. Cervicomedullary junction and pituitary are within normal limits. Thin slice coronal imaging. Symmetric hippocampal formations and temporal lobe signal which appears within normal limits. Vascular: Major intracranial vascular flow voids are preserved. Skull and upper cervical spine: Negative visible cervical spine. Normal bone marrow signal. Sinuses/Orbits:  Negative orbits aside from postoperative changes to the globes. Paranasal Visualized paranasal sinuses and mastoids are stable and well pneumatized. Other: Visible internal auditory structures appear normal. Scalp and face soft tissues appear negative. IMPRESSION: 1. No acute intracranial abnormality and largely unremarkable for age noncontrast MRI appearance of the brain. 2. Mild probable small vessel disease related signal changes with questionable involvement of the deep gray matter and midbrain. Electronically Signed   By: Genevie Ann M.D.   On: 04/25/2018 10:13     Labs:   Basic Metabolic Panel: Recent Labs  Lab 04/25/18 0546 04/26/18 0409 04/27/18 0526  NA 135 135 135  K 5.0 4.1 4.5  CL 107 108 107  CO2 18* 20* 23  GLUCOSE 142* 126* 94  BUN 35* 26* 16  CREATININE 1.11* 0.80 0.75  CALCIUM 8.9 7.9* 8.3*   GFR Estimated Creatinine Clearance: 40.9 mL/min (by C-G formula based on SCr of 0.75 mg/dL). Liver Function Tests: Recent Labs  Lab 04/25/18 0546 04/26/18 0409  AST 42* 31  ALT 24 19  ALKPHOS 118 92  BILITOT 0.4 0.3  PROT 5.9* 4.8*  ALBUMIN 3.0* 2.4*   No results for input(s): LIPASE, AMYLASE in the last 168 hours. No results for input(s):  AMMONIA in the last 168 hours. Coagulation profile No results for input(s): INR, PROTIME in the last 168 hours.  CBC: Recent Labs  Lab 04/25/18 0546 04/26/18 0409 04/27/18 0526  WBC 10.5 5.2 6.3  NEUTROABS 7.9*  --   --   HGB 10.9* 9.4* 9.6*  HCT 35.1* 29.5* 29.8*  MCV 99.4 97.0 97.7  PLT 168 137* 120*   Cardiac Enzymes: Recent Labs  Lab 04/25/18 0546  TROPONINI <0.03   BNP: Invalid input(s): POCBNP CBG: No results for input(s): GLUCAP in the last 168 hours. D-Dimer No results for input(s): DDIMER in the last 72 hours. Hgb A1c No results for input(s): HGBA1C in the last 72 hours. Lipid Profile No results for input(s): CHOL, HDL, LDLCALC, TRIG, CHOLHDL, LDLDIRECT in the last 72 hours. Thyroid function studies No  results for input(s): TSH, T4TOTAL, T3FREE, THYROIDAB in the last 72 hours.  Invalid input(s): FREET3 Anemia work up Recent Labs    04/26/18 [place]   Microbiology Recent Results (from the past 240 hour(s))  Culture, Urine     Status: Abnormal   Collection Time: 04/25/18  8:30 AM  Result Value Ref Range Status   Specimen Description URINE, RANDOM  Final   Special Requests   Final    NONE Performed at [facility], [address] [place], [place] [address]    Culture MULTIPLE SPECIES PRESENT, SUGGEST RECOLLECTION (A)  Final   Report Status 04/27/2018 FINAL  Final     Discharge Instructions:   Discharge Instructions    Diet general   Complete by:  As directed    Discharge instructions   Complete by:  As directed    Will maximize home health Needs increased family support for her as well as her husband (patient refused rehab)   Increase activity slowly   Complete by:  As directed      Allergies as of 04/27/2018      Reactions   Codeine Hypertension   Causes a sensation of "pressure" within her head   Latex Itching   Tape Itching      Medication List    STOP taking these medications   phenytoin 100 MG ER capsule Commonly known as:  DILANTIN     TAKE these medications   amLODipine 2.5 MG tablet Commonly known as:  NORVASC Take 2.5 mg by mouth daily.   aspirin 81 MG tablet Take 81 mg by mouth every other day.   Calcium-Vitamin D 600-125 MG-UNIT Tabs Take 1 tablet by mouth daily.   cefUROXime 500 MG tablet Commonly known as:  CEFTIN Take 1 tablet (500 mg total) by mouth 2 (two) times daily for 3 days.   clopidogrel 75 MG tablet Commonly known as:  PLAVIX Take 75 mg by mouth daily.   Coenzyme Q10 60 MG Tabs Take 1 tablet by mouth daily.   fexofenadine 180 MG tablet Commonly known as:  ALLEGRA Take 180 mg by mouth daily.   Fish Oil 1000 MG Caps Take 1,000 mg by mouth daily.   folic acid 1 MG tablet Commonly known as:   FOLVITE Take 1 mg by mouth daily.   levETIRAcetam 500 MG tablet Commonly known as:  KEPPRA Take 1 tablet (500 mg total) by mouth 2 (two) times daily.   mupirocin cream 2 % Commonly known as:  BACTROBAN Apply 1 application topically 2 (two) times daily.   nitroGLYCERIN 0.4 MG SL tablet Commonly known as:  NITROSTAT Place 1 tablet (0.4 mg total)  under the tongue every 5 (five) minutes as needed for chest pain.   oxybutynin 5 MG tablet Commonly known as:  DITROPAN Take 5 mg by mouth daily.      Follow-up Information    [facility] Follow up.   Specialty:  Home Health Services Why:  Registered Nurse, Physical Therapy, Occupational Therapy, Social Worker and home health aide.  Contact information: [address]  [place] [address] [phone]        Berkley Harvey, NP Follow up in 1 week(s).   Specialty:  Nurse Practitioner Contact information: [place] [address] [phone]            Time coordinating discharge: 25 min  Signed:  Geradine Girt DO  [facility] 04/27/2018, 11:21 AM

## 2018-06-05 ENCOUNTER — Other Ambulatory Visit: Payer: Self-pay

## 2018-06-05 MED ORDER — CLOPIDOGREL BISULFATE 75 MG PO TABS: 75.0000 mg | ORAL_TABLET | Freq: Every day | ORAL | 1 refills | Status: DC

## 2018-06-17 ENCOUNTER — Ambulatory Visit: Payer: Medicare Other | Admitting: Cardiology

## 2018-06-17 ENCOUNTER — Encounter: Payer: Self-pay | Admitting: Cardiology

## 2018-06-17 VITALS — BP 151/70 | HR 73 | Ht 59.0 in | Wt 123.8 lb

## 2018-06-17 DIAGNOSIS — Z0189 Encounter for other specified special examinations: Secondary | ICD-10-CM | POA: Insufficient documentation

## 2018-06-17 DIAGNOSIS — I252 Old myocardial infarction: Secondary | ICD-10-CM | POA: Diagnosis not present

## 2018-06-17 DIAGNOSIS — I255 Ischemic cardiomyopathy: Secondary | ICD-10-CM

## 2018-06-17 DIAGNOSIS — I10 Essential (primary) hypertension: Secondary | ICD-10-CM | POA: Diagnosis not present

## 2018-06-17 DIAGNOSIS — I2510 Atherosclerotic heart disease of native coronary artery without angina pectoris: Secondary | ICD-10-CM | POA: Diagnosis not present

## 2018-06-17 DIAGNOSIS — Z8669 Personal history of other diseases of the nervous system and sense organs: Secondary | ICD-10-CM

## 2018-06-17 DIAGNOSIS — Z01810 Encounter for preprocedural cardiovascular examination: Secondary | ICD-10-CM | POA: Insufficient documentation

## 2018-06-17 HISTORY — DX: Old myocardial infarction: I25.2

## 2018-06-17 HISTORY — DX: Essential (primary) hypertension: I10

## 2018-06-17 HISTORY — DX: Ischemic cardiomyopathy: I25.5

## 2018-06-17 NOTE — Progress Notes (Signed)
Subjective:   [name], female    DOB: 27-Jul-1929, 85 y.o.   MRN: [id number]  Gwendolyn Limbo, MD:  Chief Complaint  Patient presents with  . Cardiomyopathy    HPI: [name]  is a 85 y.o. female  with osteoarthritis, hypertension, found to have an NSTEMI on 01/31/2018 after she presented to [facility] with syncope and underwent coronary angiogram with stenting to her ramus and LAD. Echo performed on 01/20/2018 revealed LVEF of 35-40%.  She is on Zetia and not statin therapy in view that she does not want to be on a statin. She was last seen 2 months ago and lisinopril was changed to valsartan in view of her low EF and anticipating changing to [facility] if she did not have improvement in LVEF on her echocardiogram. During the interim, she reports that she had worsening dizziness and falls. She was changed from Dilantin to Vimpat in Sept 2019; however, she started falling and was changed back to Dilantin about 1 week before having a significant fall that led to her hospital admission on 01/02. She was found to have dilantin toxicity and also possible UTI. She is now on Keppra as given by her PCP. She was found to have AKI that improved with IVF and now back to baseline. She had echocardiogram while in the hospital.   Reports that Coreg, Valsartan, and Zetia were discontinued at the hospital. She is now doing well and has not had any further episodes of dizziness or syncope.Has shortness of breath on extreme exertion. She is working with PT at home now.Denies any chest pain. Has right leg swelling related to her right knee. She was hoping to have right knee replacement in the future, but we have recommended this be held off at least for 1 year.   She is the sole caregiver for her husband who has Alzheimer's and he has now been put on hospice. They do not have family close, therefore she cares for him by herself; however, her son is in the process of selling  his house in [place] to move here.     Past Medical History:  Diagnosis Date  . Abdominal pain, other specified site   . Allergic rhinitis   . CAD (coronary artery disease) 12/2017   s/p stent  . Diastolic CHF ([place]) 68/3419   grade 1  . Essential hypertension, benign 06/17/2018  . History of non-ST elevation myocardial infarction (NSTEMI) 06/17/2018  . Hypertension   . Irregular heart rate   . Ischemic cardiomyopathy 06/17/2018  . OSA (obstructive sleep apnea)   . Seizure disorder ([place])   . Skin cancer     Past Surgical History:  Procedure Laterality Date  . BACK SURGERY    . CHOLECYSTECTOMY    . TOTAL ABDOMINAL HYSTERECTOMY      Family History  Problem Relation Age of Onset  . Heart disease Father   . Skin cancer Father   . Skin cancer Mother   . Heart attack Mother   . Stroke Sister     Social History   Socioeconomic History  . Marital status: Married    Spouse name: Not on file  . Number of children: 1  . Years of education: Not on file  . Highest education level: Not on file  Occupational History  . Occupation: retired from [employer]  . Financial resource strain: Not on file  . Food insecurity:    Worry:  Not on file    Inability: Not on file  . Transportation needs:    Medical: Not on file    Non-medical: Not on file  Tobacco Use  . Smoking status: Never Smoker  . Smokeless tobacco: Never Used  Substance and Sexual Activity  . Alcohol use: No  . Drug use: No  . Sexual activity: Not on file  Lifestyle  . Physical activity:    Days per week: Not on file    Minutes per session: Not on file  . Stress: Not on file  Relationships  . Social connections:    Talks on phone: Not on file    Gets together: Not on file    Attends religious service: Not on file    Active member of club or organization: Not on file    Attends meetings of clubs or organizations: Not on file    Relationship status: Not on file  . Intimate partner  violence:    Fear of current or ex partner: Not on file    Emotionally abused: Not on file    Physically abused: Not on file    Forced sexual activity: Not on file  Other Topics Concern  . Not on file  Social History Narrative  . Not on file    Current Meds  Medication Sig  . amLODipine (NORVASC) 2.5 MG tablet Take 10 mg by mouth daily.   Marland Kitchen aspirin 81 MG tablet Take 81 mg by mouth every other day.   . Calcium-Vitamin D 600-125 MG-UNIT TABS Take 1 tablet by mouth daily.  . clopidogrel (PLAVIX) 75 MG tablet Take 1 tablet (75 mg total) by mouth daily.  . Coenzyme Q10 60 MG TABS Take 1 tablet by mouth daily.  . fexofenadine (ALLEGRA) 180 MG tablet Take 180 mg by mouth daily.  . folic acid (FOLVITE) 1 MG tablet Take 800 mg by mouth daily.   Marland Kitchen levETIRAcetam (KEPPRA) 500 MG tablet Take 1 tablet (500 mg total) by mouth 2 (two) times daily.  . mupirocin cream (BACTROBAN) 2 % Apply 1 application topically 2 (two) times daily.  . nitroGLYCERIN (NITROSTAT) 0.4 MG SL tablet Place 1 tablet (0.4 mg total) under the tongue every 5 (five) minutes as needed for chest pain.  . Omega-3 Fatty Acids (FISH OIL) 1000 MG CAPS Take 1,000 mg by mouth daily.   Marland Kitchen oxybutynin (DITROPAN) 5 MG tablet Take 5 mg by mouth daily.     Review of Systems  Constitution: Negative for decreased appetite, malaise/fatigue, weight gain and weight loss.  Eyes: Negative for visual disturbance.  Cardiovascular: Positive for dyspnea on exertion. Negative for chest pain, claudication, leg swelling, orthopnea, palpitations and syncope.  Respiratory: Negative for hemoptysis and wheezing.   Endocrine: Negative for cold intolerance and heat intolerance.  Hematologic/Lymphatic: Does not bruise/bleed easily.  Skin: Negative for nail changes.  Musculoskeletal: Positive for joint swelling (right knee). Negative for muscle weakness and myalgias.  Gastrointestinal: Negative for abdominal pain, change in bowel habit, nausea and vomiting.    Neurological: Positive for weakness (improving). Negative for difficulty with concentration, dizziness, focal weakness and headaches.  Psychiatric/Behavioral: Negative for altered mental status and suicidal ideas.  All other systems reviewed and are negative.      Objective:     Blood pressure (!) 151/70, pulse 73, height 4\' 11"  (1.499 m), weight 123 lb 12.8 oz (56.2 kg), SpO2 97 %.  Hospital echocardiogram 01/17/2018: Apical wall akinesis. LVEF 36-14% Mild diaastolic dysfunction.  Mild aortic sclerosis. No  evidence of stenosis. Mild TR. RVSP 31 mmHg.  Mildy dilated left atrium.  Echo 04/26/2018: Left ventricle: The cavity size was normal. Wall thickness was   normal. Systolic function was vigorous. The estimated ejection   fraction was in the range of 65% to 70%. Wall motion was normal;   there were no regional wall motion abnormalities. Left   ventricular diastolic function parameters were normal. - Mitral valve: Calcified annulus. Mildly thickened leaflets . - Left atrium: The atrium was moderately dilated. - Tricuspid valve: There was mild regurgitation.  Hospital admission on 01/17/18 with fall. Trop found to be mildly elevated at 0.06. Had cath and PCI to severe stensosi in Ramus Mod LAD stenosis-->Medical management.   Physical Exam  Constitutional: She is oriented to person, place, and time. Vital signs are normal. She appears well-developed and well-nourished.  HENT:  Head: Normocephalic and atraumatic.  Neck: Normal range of motion.  Cardiovascular: Normal rate, regular rhythm, normal heart sounds and intact distal pulses.  Pulmonary/Chest: Effort normal and breath sounds normal. No accessory muscle usage. No respiratory distress.  Abdominal: Soft. Bowel sounds are normal.  Musculoskeletal: Normal range of motion.  Neurological: She is alert and oriented to person, place, and time.  Skin: Skin is warm and dry.  Vitals reviewed.          Assessment &  Recommendations:   1. Coronary artery disease involving native coronary artery of native heart without angina pectoris Doing well without symptoms of angina. Continue with DAPT. Not on statin in view of patient wishes. Encouraged her to resume Zetia. She was previously on Coreg but was discontinued at the hospital in view of dizziness. Wild hold off on resuming for now.  2. History of non-ST elevation myocardial infarction (NSTEMI) As stated above. Continue with DAPT for at least 1 year.  3. Ischemic cardiomyopathy Now resolved by echo performed on 01/03 due to revascularization. LVEF 65-70%.   4. Essential hypertension, benign Coreg and Valsartan was discontinued during hospital stay. She is on amlodipine, but blood pressure remains elevated. Found to have AKI on admission to hospital that resolved with IVF. I have recommended resuming low dose valsartan and closely monitoring her kidney function. Will check BMP in 10 days. Will hold off on resuming Coreg yet.   5. History of seizure disorder She is now on Keppra. Being managed by PCP. Dizziness resolved after stopping Dilantin.   I will see her back in 3 months or sooner if problems.     Jeri Lager, FNP-C [facility], [place] Office: [phone] Fax: [phone]

## 2018-06-19 ENCOUNTER — Telehealth: Payer: Self-pay

## 2018-06-19 DIAGNOSIS — I10 Essential (primary) hypertension: Secondary | ICD-10-CM

## 2018-06-19 NOTE — Telephone Encounter (Signed)
Pt called to let you know that she had a CT scan, ekg, MRI and echo @ the hospital 1/2/20202

## 2018-06-20 ENCOUNTER — Encounter: Payer: Self-pay | Admitting: Cardiology

## 2018-06-20 MED ORDER — EZETIMIBE 10 MG PO TABS: 10.0000 mg | ORAL_TABLET | Freq: Every day | ORAL | 3 refills | Status: DC

## 2018-06-20 MED ORDER — VALSARTAN 80 MG PO TABS: 80.0000 mg | ORAL_TABLET | Freq: Every day | ORAL | 1 refills | Status: DC

## 2018-06-20 NOTE — Telephone Encounter (Signed)
Patient says that [facility] bill came from [facility]? Not sure why we cannot see it

## 2018-06-20 NOTE — Telephone Encounter (Signed)
Please let her know that the only echo I have found was the one from 01/20/2018. Can you find out which hospital she was in in Jan so we can get records?

## 2018-06-20 NOTE — Addendum Note (Signed)
Addended by: Gwinda Maine on: 06/20/2018 03:03 PM   Modules accepted: Orders

## 2018-08-07 ENCOUNTER — Other Ambulatory Visit: Payer: Self-pay | Admitting: Cardiology

## 2018-08-07 NOTE — Telephone Encounter (Signed)
Please fill

## 2018-09-20 ENCOUNTER — Telehealth: Payer: Self-pay

## 2018-09-20 MED ORDER — OLMESARTAN MEDOXOMIL 20 MG PO TABS: 40.0000 mg | ORAL_TABLET | Freq: Every day | ORAL | 2 refills | Status: DC

## 2018-09-20 NOTE — Telephone Encounter (Signed)
Pt called the valsartan is on back order can you please send in a substitute [phone]

## 2018-09-23 ENCOUNTER — Other Ambulatory Visit: Payer: Self-pay

## 2018-09-23 DIAGNOSIS — I10 Essential (primary) hypertension: Secondary | ICD-10-CM

## 2018-09-23 MED ORDER — OLMESARTAN MEDOXOMIL 20 MG PO TABS: 20.0000 mg | ORAL_TABLET | Freq: Every day | ORAL | 2 refills | Status: DC

## 2018-10-07 ENCOUNTER — Other Ambulatory Visit: Payer: Self-pay | Admitting: Cardiology

## 2018-10-15 ENCOUNTER — Telehealth: Payer: Self-pay

## 2018-10-15 NOTE — Telephone Encounter (Signed)
Pt called wanting rf on Oxybutynin. I do not see where we have filled it in epic. Please advise.//ah

## 2018-10-16 NOTE — Telephone Encounter (Signed)
Pt aware.//ah

## 2019-01-08 ENCOUNTER — Ambulatory Visit: Payer: Medicare Other | Admitting: Podiatry

## 2019-01-08 ENCOUNTER — Encounter: Payer: Self-pay | Admitting: Podiatry

## 2019-01-08 ENCOUNTER — Other Ambulatory Visit: Payer: Self-pay

## 2019-01-08 DIAGNOSIS — M79675 Pain in left toe(s): Secondary | ICD-10-CM | POA: Diagnosis not present

## 2019-01-08 DIAGNOSIS — L84 Corns and callosities: Secondary | ICD-10-CM | POA: Diagnosis not present

## 2019-01-08 DIAGNOSIS — M79674 Pain in right toe(s): Secondary | ICD-10-CM

## 2019-01-08 DIAGNOSIS — B351 Tinea unguium: Secondary | ICD-10-CM | POA: Diagnosis not present

## 2019-01-13 ENCOUNTER — Other Ambulatory Visit: Payer: Self-pay

## 2019-01-13 ENCOUNTER — Ambulatory Visit: Payer: Medicare Other | Admitting: Podiatry

## 2019-01-13 DIAGNOSIS — I10 Essential (primary) hypertension: Secondary | ICD-10-CM

## 2019-01-13 MED ORDER — OLMESARTAN MEDOXOMIL 20 MG PO TABS: 20.0000 mg | ORAL_TABLET | Freq: Every day | ORAL | 2 refills | Status: DC

## 2019-01-14 NOTE — Progress Notes (Signed)
Subjective:   Patient ID: [name], female   DOB: 85 y.o.   MRN: [id number]   HPI Patient presents with several problems 1 being thickened nails and the other being lesion formation left that is painful and states that this is been ongoing and that she is tried to trim them herself and do the callus but she cannot do it and reach them and they can hurt.  Patient does not smoke likes to be active   Review of Systems  All other systems reviewed and are negative.       Objective:  Physical Exam Vitals signs and nursing note reviewed.  Constitutional:      Appearance: She is well-developed.  Pulmonary:     Effort: Pulmonary effort is normal.  Musculoskeletal: Normal range of motion.  Skin:    General: Skin is warm.  Neurological:     Mental Status: She is alert.     Neurovascular status was found to be intact muscle strength was found to be adequate range of motion within normal limits.  Patient is noted to have thickened yellow brittle nailbeds 1-5 both feet that are painful when pressed and make shoe gear difficult and patient has tried different treatment options without relief     Assessment:  Chronic mycotic nail infection 1-5 both feet and lesion formation bilateral that are painful     Plan:  H&P reviewed conditions and went ahead today and nailbeds 1-5 both feet with no iatrogenic bleeding and lesions bilateral with no iatrogenic bleeding and reappoint for routine care

## 2019-04-08 ENCOUNTER — Ambulatory Visit: Payer: Medicare Other | Admitting: Podiatry

## 2019-04-09 ENCOUNTER — Telehealth: Payer: Self-pay

## 2019-04-09 DIAGNOSIS — I10 Essential (primary) hypertension: Secondary | ICD-10-CM

## 2019-04-09 MED ORDER — OLMESARTAN MEDOXOMIL 20 MG PO TABS: 20.0000 mg | ORAL_TABLET | Freq: Every day | ORAL | 2 refills | Status: DC

## 2019-04-09 MED ORDER — CLOPIDOGREL BISULFATE 75 MG PO TABS: 75.0000 mg | ORAL_TABLET | Freq: Every day | ORAL | 1 refills | Status: DC

## 2019-07-03 ENCOUNTER — Other Ambulatory Visit: Payer: Self-pay | Admitting: Cardiology

## 2019-07-03 DIAGNOSIS — I2510 Atherosclerotic heart disease of native coronary artery without angina pectoris: Secondary | ICD-10-CM

## 2019-07-18 ENCOUNTER — Other Ambulatory Visit: Payer: Self-pay | Admitting: Cardiology

## 2019-07-18 DIAGNOSIS — I2510 Atherosclerotic heart disease of native coronary artery without angina pectoris: Secondary | ICD-10-CM

## 2019-10-03 ENCOUNTER — Ambulatory Visit (INDEPENDENT_AMBULATORY_CARE_PROVIDER_SITE_OTHER): Payer: Medicare Other | Admitting: Internal Medicine

## 2019-10-03 ENCOUNTER — Encounter: Payer: Self-pay | Admitting: Internal Medicine

## 2019-10-03 VITALS — BP 124/70 | HR 74 | Ht 58.27 in | Wt 118.2 lb

## 2019-10-03 DIAGNOSIS — I2584 Coronary atherosclerosis due to calcified coronary lesion: Secondary | ICD-10-CM | POA: Diagnosis not present

## 2019-10-03 DIAGNOSIS — G4731 Primary central sleep apnea: Secondary | ICD-10-CM

## 2019-10-03 DIAGNOSIS — I2510 Atherosclerotic heart disease of native coronary artery without angina pectoris: Secondary | ICD-10-CM | POA: Diagnosis not present

## 2019-10-03 DIAGNOSIS — G4733 Obstructive sleep apnea (adult) (pediatric): Secondary | ICD-10-CM

## 2019-10-03 NOTE — Assessment & Plan Note (Signed)
She understands her cardiac problems to be under control at this time. Not in obvious heart failure clinically.

## 2019-10-03 NOTE — Progress Notes (Signed)
10/03/19- 89 yoF never smoker for sleep evaluation Widowed in December. Lives alone in own home. Got a ride here. NPSG 2006, AHI 84/ hr desaturation to 82%, body weight 149 lbs at [facility].  Had seen Dr Gwenette Greet for Complex Sleep Apnea in 2016, using ASV adapt mode 8/1  / [facility]. Has gotten supplies from [employer].. Medical problem list includes CAD/MI/ CM, dCHF, HTN, Allergic Rhinitis, Seizure Disorder(2017), Diverticulitis, Anemia,  Body weight today 118 lbs Her current machine was new in 2012 and motor failed about a week ago. Sleeping poorly sitting in chair- misses her PAP. No sleep meds. No ENT surgery.  Prior to Admission medications   Medication Sig Start Date End Date Taking? Authorizing Provider  amLODipine (NORVASC) 2.5 MG tablet Take 10 mg by mouth daily.    Yes [provider]  aspirin 81 MG tablet Take 81 mg by mouth every other day.    Yes [provider]  clopidogrel (PLAVIX) 75 MG tablet Take 1 tablet (75 mg total) by mouth daily. 04/09/19  Yes Miquel Dunn, NP  ezetimibe (ZETIA) 10 MG tablet TAKE 1 TABLET BY MOUTH DAILY GENERIC EQUIVALENT FOR ZETIA 07/18/19  Yes Miquel Dunn, NP  levETIRAcetam (KEPPRA) 500 MG tablet Take 1 tablet (500 mg total) by mouth 2 (two) times daily. 04/27/18  Yes Vann, Jessica U, DO  olmesartan (BENICAR) 20 MG tablet Take 1 tablet (20 mg total) by mouth daily. 04/09/19 04/08/20 Yes Miquel Dunn, NP  oxybutynin (DITROPAN) 5 MG tablet Take 5 mg by mouth daily.   Yes [provider]  Calcium-Vitamin D 600-125 MG-UNIT TABS Take 1 tablet by mouth daily. Patient not taking: Reported on 10/03/2019    [provider]  Coenzyme Q10 60 MG TABS Take 1 tablet by mouth daily. Patient not taking: Reported on 10/03/2019    [provider]  cyanocobalamin 1000 MCG tablet Take 1,000 mcg by mouth daily.    [provider]  fexofenadine (ALLEGRA) 180 MG tablet Take 180  mg by mouth daily. Patient not taking: Reported on 10/03/2019    [provider]  folic acid (FOLVITE) 1 MG tablet Take 800 mg by mouth daily.  Patient not taking: Reported on 10/03/2019    [provider]  lactobacillus acidophilus (BACID) TABS tablet Take 1 tablet by mouth daily. Patient not taking: Reported on 10/03/2019    [provider]  mupirocin cream (BACTROBAN) 2 % Apply 1 application topically 2 (two) times daily. Patient not taking: Reported on 10/03/2019 10/12/17   Jeannett Senior, PA-C  nitroGLYCERIN (NITROSTAT) 0.4 MG SL tablet Place 1 tablet (0.4 mg total) under the tongue every 5 (five) minutes as needed for chest pain. Patient not taking: Reported on 10/03/2019 10/03/15   Regalado, Jerald Kief A, MD  Omega-3 Fatty Acids (FISH OIL) 1000 MG CAPS Take 1,000 mg by mouth daily.  Patient not taking: Reported on 10/03/2019    [provider]   Past Medical History:  Diagnosis Date  . Abdominal pain, other specified site   . Allergic rhinitis   . CAD (coronary artery disease) 12/2017   s/p stent  . Diastolic CHF ([place]) 82/9937   grade 1  . Essential hypertension, benign 06/17/2018  . History of non-ST elevation myocardial infarction (NSTEMI) 06/17/2018  . Hypertension   . Irregular heart rate   . Ischemic cardiomyopathy 06/17/2018  . OSA (obstructive sleep apnea)   . Seizure disorder ([place])   . Skin cancer  Past Surgical History:  Procedure Laterality Date  . BACK SURGERY    . CHOLECYSTECTOMY    . TOTAL ABDOMINAL HYSTERECTOMY     Family History  Problem Relation Age of Onset  . Heart disease Father   . Skin cancer Father   . Skin cancer Mother   . Heart attack Mother   . Stroke Sister    Social History   Socioeconomic History  . Marital status: Married    Spouse name: Not on file  . Number of children: 1  . Years of education: Not on file  . Highest education level: Not on file  Occupational History  . Occupation: retired from  [employer]  . Smoking status: Never Smoker  . Smokeless tobacco: Never Used  Substance and Sexual Activity  . Alcohol use: No  . Drug use: No  . Sexual activity: Not on file  Other Topics Concern  . Not on file  Social History Narrative  . Not on file   Social Determinants of Health   Financial Resource Strain:   . Difficulty of Paying Living Expenses:   Food Insecurity:   . Worried About Charity fundraiser in the Last Year:   . Arboriculturist in the Last Year:   Transportation Needs:   . Film/video editor (Medical):   Marland Kitchen Lack of Transportation (Non-Medical):   Physical Activity:   . Days of Exercise per Week:   . Minutes of Exercise per Session:   Stress:   . Feeling of Stress :   Social Connections:   . Frequency of Communication with Friends and Family:   . Frequency of Social Gatherings with Friends and Family:   . Attends Religious Services:   . Active Member of Clubs or Organizations:   . Attends Archivist Meetings:   Marland Kitchen Marital Status:   Intimate Partner Violence:   . Fear of Current or Ex-Partner:   . Emotionally Abused:   Marland Kitchen Physically Abused:   . Sexually Abused:    ROS-see HPI   + = positive Constitutional:    weight loss, night sweats, fevers, chills, fatigue, lassitude. HEENT:    headaches, difficulty swallowing, tooth/dental problems, sore throat,       sneezing, +itching, ear ache, nasal congestion, post nasal drip, snoring CV:    chest pain, orthopnea, PND, +swelling in lower extremities, anasarca,                                  dizziness, +palpitations Resp:   +shortness of breath with exertion or at rest.                productive cough,   non-productive cough, coughing up of blood.              change in color of mucus.  wheezing.   Skin:    +rash or lesions. GI:  No-   heartburn, indigestion, abdominal pain, nausea, vomiting, diarrhea,                 change in bowel habits, loss of appetite GU: dysuria,  change in color of urine, no urgency or frequency.   flank pain. MS:   +joint pain, stiffness, decreased range of motion, back pain. Neuro-     nothing unusual Psych:  change in mood or affect.  depression or anxiety.   memory loss.  OBJ- Physical Exam  General- Very Alert, Oriented, Affect-appropriate, Distress- none acute, + frail elderly, + rolling walker Skin- rash-none, lesions- none, excoriation- none Lymphadenopathy- none Head- atraumatic            Eyes- Gross vision intact, PERRLA, conjunctivae and secretions clear            Ears- Hearing, canals-normal            Nose- Clear, no-Septal dev, mucus, polyps, erosion, perforation             Throat- Mallampati III , mucosa clear , drainage- none, tonsils- atrophic,  + teeth Neck- flexible , trachea midline, no stridor , thyroid nl, carotid no bruit Chest - symmetrical excursion , unlabored           Heart/CV- RRR , no murmur , no gallop  , no rub, nl s1 s2                           - JVD- none , edema- none, stasis changes- none, varices- none           Lung- clear to P&A, wheeze- none, cough- none , dullness-none, rub- none           Chest wall-  Abd-  Br/ Gen/ Rectal- Not done, not indicated Extrem- cyanosis- none, clubbing, none, atrophy- none, strength- nl Neuro- grossly intact to observation

## 2019-10-03 NOTE — Assessment & Plan Note (Signed)
Needs documentation to re-qualify for PAP because of long time since original study, and weight loss. She is very used to sleeping with her current machine and at least emotionally dependent on it.  Appropriate discussion done.  Plan- HST, then replace with CPAP if appropriate.

## 2019-10-03 NOTE — Patient Instructions (Signed)
Order- schedule home sleep test    Dx OSA  Please call our office about 2 weeks after your sleep test for results and recommendations. If appropriate, we may be able to start treatment before we see you next.

## 2019-10-20 ENCOUNTER — Telehealth: Payer: Self-pay | Admitting: Internal Medicine

## 2019-10-20 NOTE — Telephone Encounter (Signed)
Looking into this.

## 2019-10-21 NOTE — Telephone Encounter (Signed)
Pt is scheduled to pick up a HST machine with me on 6/30 @ 3:00.  Nothing further needed at this time.

## 2019-10-22 ENCOUNTER — Other Ambulatory Visit: Payer: Self-pay

## 2019-10-22 DIAGNOSIS — G4733 Obstructive sleep apnea (adult) (pediatric): Secondary | ICD-10-CM | POA: Diagnosis not present

## 2019-10-23 ENCOUNTER — Other Ambulatory Visit: Payer: Self-pay

## 2019-10-23 MED ORDER — CLOPIDOGREL BISULFATE 75 MG PO TABS: 75.0000 mg | ORAL_TABLET | Freq: Every day | ORAL | 1 refills | Status: DC

## 2019-10-28 DIAGNOSIS — G4733 Obstructive sleep apnea (adult) (pediatric): Secondary | ICD-10-CM

## 2019-11-07 ENCOUNTER — Ambulatory Visit: Payer: Medicare Other | Admitting: Cardiology

## 2019-11-07 ENCOUNTER — Telehealth: Payer: Self-pay | Admitting: Internal Medicine

## 2019-11-07 DIAGNOSIS — G4731 Primary central sleep apnea: Secondary | ICD-10-CM

## 2019-11-07 NOTE — Telephone Encounter (Signed)
Spoke with pt, advised message from Dr. Annamaria Boots. She is requesting a new VPAP machine. She didn't want to do the ONO because she said she is waiting on a machine. I explained to her what CY suggested but she still doesn't understand. CY please advise.

## 2019-11-07 NOTE — Telephone Encounter (Signed)
Order- DME [facility]    replace old ASV machine- ASV adapt mode 8/1, settings as before, mask of choice, humidifier, supplies, AirView/ card        For dx Central sleep apnea  Please get her a return ov in 31- 90 days, per insurance regs.

## 2019-11-07 NOTE — Telephone Encounter (Signed)
Called and spoke with pt letting her know order was placed for her to receive new machine from [facility] and she verbalized understanding. appt has also been placed.nothing further needed.

## 2019-11-07 NOTE — Telephone Encounter (Signed)
The home sleep test showed only very mild CENTRAL sleep apnea, averaging 7 to 8 apneas/ hour. This is not the kind of apnea we are usually concerned with, and we don't treat this with CPAP. I do want to check oxygen scores at night.  Order- please order overnight oximetry on room air for dx Nocturnal Hypoxemia

## 2019-11-07 NOTE — Telephone Encounter (Signed)
Dr. Annamaria Boots, please advise on the results of pt's HST.

## 2019-11-19 ENCOUNTER — Telehealth: Payer: Self-pay | Admitting: Internal Medicine

## 2019-11-19 NOTE — Telephone Encounter (Signed)
Pt is aware that order was placed on 11/07/2019 for replacement of ASV.  Pt wanted to confirm that cpap was not ordered. Pt is aware and voiced her understanding.  Nothing further is needed.

## 2019-11-21 ENCOUNTER — Telehealth: Payer: Self-pay | Admitting: Internal Medicine

## 2019-11-21 NOTE — Telephone Encounter (Signed)
I have called the patient and let her know that [employer] is where the approval would come from told her I would get them to call her

## 2019-11-26 ENCOUNTER — Ambulatory Visit: Payer: Medicare Other | Admitting: Cardiology

## 2019-12-03 ENCOUNTER — Encounter: Payer: Self-pay | Admitting: Cardiology

## 2019-12-03 ENCOUNTER — Ambulatory Visit: Payer: Medicare Other | Admitting: Cardiology

## 2019-12-03 ENCOUNTER — Other Ambulatory Visit: Payer: Self-pay

## 2019-12-03 VITALS — BP 145/65 | HR 78 | Resp 18 | Ht 58.5 in | Wt 119.0 lb

## 2019-12-03 DIAGNOSIS — I252 Old myocardial infarction: Secondary | ICD-10-CM

## 2019-12-03 DIAGNOSIS — I2510 Atherosclerotic heart disease of native coronary artery without angina pectoris: Secondary | ICD-10-CM

## 2019-12-03 DIAGNOSIS — I10 Essential (primary) hypertension: Secondary | ICD-10-CM

## 2019-12-03 MED ORDER — ASPIRIN EC 81 MG PO TBEC: 81.0000 mg | DELAYED_RELEASE_TABLET | Freq: Every day | ORAL | 3 refills | Status: DC

## 2019-12-03 MED ORDER — OLMESARTAN MEDOXOMIL 20 MG PO TABS: 20.0000 mg | ORAL_TABLET | Freq: Every day | ORAL | 3 refills | Status: DC

## 2019-12-03 MED ORDER — AMLODIPINE BESYLATE 10 MG PO TABS: 10.0000 mg | ORAL_TABLET | Freq: Every day | ORAL | 3 refills | Status: DC

## 2019-12-03 MED ORDER — EZETIMIBE 10 MG PO TABS: 10.0000 mg | ORAL_TABLET | Freq: Every day | ORAL | 3 refills | Status: DC

## 2019-12-03 NOTE — Progress Notes (Signed)
Follow up visit  Subjective:   [name], female    DOB: Mar 18, 1930, 86 y.o.   MRN: [id number]    HPI  Chief Complaint  Patient presents with  . Coronary Artery Disease  . Hypertension  . Follow-up    86 y/o Caucasian female with hypertension, CAD, h/o NSTEMI, h/o seizure disorder  Patient was last seen in 05/2018. She has lost her husband since then. She now lives by herself, is able to perform all ADL's. She denies chest pain, has stable unchanged mild exertional dyspnea that is not lifestyle limiting. She has easy bruising with minimal injuries.  Blood pressure mildly elevated today, usually much lower than this. She has had recurrent cellulitis in RLE which has left some residual swelling.     Current Outpatient Medications on File Prior to Visit  Medication Sig Dispense Refill  . amLODipine (NORVASC) 2.5 MG tablet Take 10 mg by mouth daily.     Marland Kitchen aspirin 81 MG tablet Take 81 mg by mouth every other day.     . Calcium-Vitamin D 600-125 MG-UNIT TABS Take 1 tablet by mouth daily.     . clopidogrel (PLAVIX) 75 MG tablet Take 1 tablet (75 mg total) by mouth daily. 90 tablet 1  . Coenzyme Q10 60 MG TABS Take 1 tablet by mouth daily.     . cyanocobalamin 1000 MCG tablet Take 1,000 mcg by mouth daily.    Marland Kitchen ezetimibe (ZETIA) 10 MG tablet TAKE 1 TABLET BY MOUTH DAILY GENERIC EQUIVALENT FOR ZETIA 90 tablet 3  . lactobacillus acidophilus (BACID) TABS tablet Take 1 tablet by mouth daily.     Marland Kitchen levETIRAcetam (KEPPRA) 500 MG tablet Take 1 tablet (500 mg total) by mouth 2 (two) times daily. 60 tablet 0  . nitroGLYCERIN (NITROSTAT) 0.4 MG SL tablet Place 1 tablet (0.4 mg total) under the tongue every 5 (five) minutes as needed for chest pain. 10 tablet 0  . olmesartan (BENICAR) 20 MG tablet Take 1 tablet (20 mg total) by mouth daily. 90 tablet 2  . Omega-3 Fatty Acids (FISH OIL) 1000 MG CAPS Take 1,000 mg by mouth daily.     Marland Kitchen oxybutynin (DITROPAN) 5 MG tablet Take 5 mg by mouth  daily.    . vitamin E (VITAMIN E) 180 MG (400 UNITS) capsule Take 400 Units by mouth daily.    . mupirocin ointment (BACTROBAN) 2 % mupirocin 2 % topical ointment  APPLY TO LEG AS NEEDED FOR SORES     No current facility-administered medications on file prior to visit.    Cardiovascular & other pertient studies:  EKG 12/03/2019: Sinus rhythm 75 bpm Old anteroseptal infarct  Recent labs: 08/06/2019: Glucose 97, BUN/Cr 19/0.77. EGFR 70. Na/K 140/4.2. Rest of the CMP normal H/H 9.9/29.2. MCV 92. Platelets 225  04/2018: Glucose 126, BUN/Cr 26/0.8. EGFR >60. Na/K 135/4.1. Rest of the CMP normal H/H 9.6/29.8. MCV 97. Platelets 120     ROS       Vitals:   12/03/19 1507  BP: (!) 145/65  Pulse: 78  Resp: 18  SpO2: 99%     Body mass index is 24.45 kg/m. Filed Weights   12/03/19 1507  Weight: 119 lb (54 kg)     Objective:   Physical Exam Vitals and nursing note reviewed.  Constitutional:      General: She is not in acute distress. Neck:     Vascular: No JVD.  Cardiovascular:     Rate and Rhythm: Normal rate and  regular rhythm.     Heart sounds: Normal heart sounds. No murmur heard.   Pulmonary:     Effort: Pulmonary effort is normal.     Breath sounds: Normal breath sounds. No wheezing or rales.  Musculoskeletal:     Right lower leg: Edema (RLE calf swelling with chronic fibrotic changes) present.           Assessment & Recommendations:   86 y/o Caucasian female with hypertension, CAD, h/o NSTEMI, h/o seizure disorder  CAD: Stable. Prior NSTEMI. No ongoing angina symptoms. Stopped plavix. Continue Aspirin. Continue Zetia.  Exertional dyspnea: Mild, stable. Given her age and relatively mild nature of symptoms, do not recommend any testing.   Hypertension: Reasonably well controlled. Only mildly elevated today.  F/u in 1 year  Nigel Mormon, MD [facility] Pager: [phone] Office: [phone]

## 2019-12-14 ENCOUNTER — Emergency Department (HOSPITAL_COMMUNITY): Payer: Medicare Other

## 2019-12-14 ENCOUNTER — Other Ambulatory Visit: Payer: Self-pay

## 2019-12-14 ENCOUNTER — Inpatient Hospital Stay (HOSPITAL_COMMUNITY): Payer: Medicare Other

## 2019-12-14 ENCOUNTER — Inpatient Hospital Stay (HOSPITAL_COMMUNITY)
Admission: EM | Admit: 2019-12-14 | Discharge: 2019-12-23 | DRG: 956 | Disposition: A | Discharge status: Skilled Nursing Facility | Payer: Medicare Other | Attending: General Surgery | Admitting: General Surgery

## 2019-12-14 ENCOUNTER — Encounter (HOSPITAL_COMMUNITY): Payer: Self-pay | Admitting: Emergency Medicine

## 2019-12-14 DIAGNOSIS — Z20822 Contact with and (suspected) exposure to covid-19: Secondary | ICD-10-CM | POA: Diagnosis present

## 2019-12-14 DIAGNOSIS — Y9222 Religious institution as the place of occurrence of the external cause: Secondary | ICD-10-CM | POA: Diagnosis not present

## 2019-12-14 DIAGNOSIS — Y92481 Parking lot as the place of occurrence of the external cause: Secondary | ICD-10-CM

## 2019-12-14 DIAGNOSIS — S72451A Displaced supracondylar fracture without intracondylar extension of lower end of right femur, initial encounter for closed fracture: Secondary | ICD-10-CM | POA: Diagnosis present

## 2019-12-14 DIAGNOSIS — I5032 Chronic diastolic (congestive) heart failure: Secondary | ICD-10-CM | POA: Diagnosis present

## 2019-12-14 DIAGNOSIS — S2220XA Unspecified fracture of sternum, initial encounter for closed fracture: Secondary | ICD-10-CM

## 2019-12-14 DIAGNOSIS — Y93H3 Activity, building and construction: Secondary | ICD-10-CM

## 2019-12-14 DIAGNOSIS — S72401A Unspecified fracture of lower end of right femur, initial encounter for closed fracture: Secondary | ICD-10-CM

## 2019-12-14 DIAGNOSIS — G4733 Obstructive sleep apnea (adult) (pediatric): Secondary | ICD-10-CM | POA: Diagnosis present

## 2019-12-14 DIAGNOSIS — Z91048 Other nonmedicinal substance allergy status: Secondary | ICD-10-CM

## 2019-12-14 DIAGNOSIS — J301 Allergic rhinitis due to pollen: Secondary | ICD-10-CM | POA: Diagnosis present

## 2019-12-14 DIAGNOSIS — I2510 Atherosclerotic heart disease of native coronary artery without angina pectoris: Secondary | ICD-10-CM | POA: Diagnosis present

## 2019-12-14 DIAGNOSIS — R52 Pain, unspecified: Secondary | ICD-10-CM | POA: Diagnosis present

## 2019-12-14 DIAGNOSIS — M109 Gout, unspecified: Secondary | ICD-10-CM | POA: Diagnosis present

## 2019-12-14 DIAGNOSIS — S22019A Unspecified fracture of first thoracic vertebra, initial encounter for closed fracture: Secondary | ICD-10-CM | POA: Diagnosis present

## 2019-12-14 DIAGNOSIS — Z91040 Latex allergy status: Secondary | ICD-10-CM

## 2019-12-14 DIAGNOSIS — Z85828 Personal history of other malignant neoplasm of skin: Secondary | ICD-10-CM

## 2019-12-14 DIAGNOSIS — S22029A Unspecified fracture of second thoracic vertebra, initial encounter for closed fracture: Secondary | ICD-10-CM | POA: Diagnosis present

## 2019-12-14 DIAGNOSIS — G40909 Epilepsy, unspecified, not intractable, without status epilepticus: Secondary | ICD-10-CM | POA: Diagnosis present

## 2019-12-14 DIAGNOSIS — S36039A Unspecified laceration of spleen, initial encounter: Secondary | ICD-10-CM | POA: Diagnosis present

## 2019-12-14 DIAGNOSIS — M1711 Unilateral primary osteoarthritis, right knee: Secondary | ICD-10-CM | POA: Diagnosis present

## 2019-12-14 DIAGNOSIS — I110 Hypertensive heart disease with heart failure: Secondary | ICD-10-CM | POA: Diagnosis present

## 2019-12-14 DIAGNOSIS — S2221XA Fracture of manubrium, initial encounter for closed fracture: Secondary | ICD-10-CM | POA: Diagnosis present

## 2019-12-14 DIAGNOSIS — Z419 Encounter for procedure for purposes other than remedying health state, unspecified: Secondary | ICD-10-CM

## 2019-12-14 DIAGNOSIS — I255 Ischemic cardiomyopathy: Secondary | ICD-10-CM | POA: Diagnosis present

## 2019-12-14 DIAGNOSIS — Z7982 Long term (current) use of aspirin: Secondary | ICD-10-CM | POA: Diagnosis not present

## 2019-12-14 DIAGNOSIS — Z823 Family history of stroke: Secondary | ICD-10-CM

## 2019-12-14 DIAGNOSIS — Z885 Allergy status to narcotic agent status: Secondary | ICD-10-CM

## 2019-12-14 DIAGNOSIS — M810 Age-related osteoporosis without current pathological fracture: Secondary | ICD-10-CM | POA: Diagnosis present

## 2019-12-14 DIAGNOSIS — S2243XA Multiple fractures of ribs, bilateral, initial encounter for closed fracture: Secondary | ICD-10-CM

## 2019-12-14 DIAGNOSIS — I252 Old myocardial infarction: Secondary | ICD-10-CM | POA: Diagnosis not present

## 2019-12-14 DIAGNOSIS — D62 Acute posthemorrhagic anemia: Secondary | ICD-10-CM | POA: Diagnosis not present

## 2019-12-14 DIAGNOSIS — S22009A Unspecified fracture of unspecified thoracic vertebra, initial encounter for closed fracture: Secondary | ICD-10-CM

## 2019-12-14 DIAGNOSIS — Z79899 Other long term (current) drug therapy: Secondary | ICD-10-CM | POA: Diagnosis not present

## 2019-12-14 DIAGNOSIS — E785 Hyperlipidemia, unspecified: Secondary | ICD-10-CM | POA: Diagnosis present

## 2019-12-14 DIAGNOSIS — S7291XA Unspecified fracture of right femur, initial encounter for closed fracture: Secondary | ICD-10-CM

## 2019-12-14 DIAGNOSIS — Z8249 Family history of ischemic heart disease and other diseases of the circulatory system: Secondary | ICD-10-CM

## 2019-12-14 DIAGNOSIS — R9431 Abnormal electrocardiogram [ECG] [EKG]: Secondary | ICD-10-CM | POA: Diagnosis not present

## 2019-12-14 DIAGNOSIS — Z808 Family history of malignant neoplasm of other organs or systems: Secondary | ICD-10-CM

## 2019-12-14 DIAGNOSIS — T148XXA Other injury of unspecified body region, initial encounter: Secondary | ICD-10-CM

## 2019-12-14 LAB — URINALYSIS, ROUTINE W REFLEX MICROSCOPIC
Bilirubin Urine: NEGATIVE
Glucose, UA: NEGATIVE mg/dL
Hgb urine dipstick: NEGATIVE
Ketones, ur: NEGATIVE mg/dL
Nitrite: NEGATIVE
Protein, ur: NEGATIVE mg/dL
Specific Gravity, Urine: 1.012 (ref 1.005–1.030)
pH: 7 (ref 5.0–8.0)

## 2019-12-14 LAB — CBC
HCT: 33.9 % — ABNORMAL LOW (ref 36.0–46.0)
Hemoglobin: 10.9 g/dL — ABNORMAL LOW (ref 12.0–15.0)
MCH: 31.2 pg (ref 26.0–34.0)
MCHC: 32.2 g/dL (ref 30.0–36.0)
MCV: 97.1 fL (ref 80.0–100.0)
Platelets: 154 10*3/uL (ref 150–400)
RBC: 3.49 MIL/uL — ABNORMAL LOW (ref 3.87–5.11)
RDW: 13.2 % (ref 11.5–15.5)
WBC: 7.5 10*3/uL (ref 4.0–10.5)
nRBC: 0 % (ref 0.0–0.2)

## 2019-12-14 LAB — I-STAT CHEM 8, ED
BUN: 23 mg/dL (ref 8–23)
Calcium, Ion: 1.12 mmol/L — ABNORMAL LOW (ref 1.15–1.40)
Chloride: 106 mmol/L (ref 98–111)
Creatinine, Ser: 0.7 mg/dL (ref 0.44–1.00)
Glucose, Bld: 124 mg/dL — ABNORMAL HIGH (ref 70–99)
HCT: 32 % — ABNORMAL LOW (ref 36.0–46.0)
Hemoglobin: 10.9 g/dL — ABNORMAL LOW (ref 12.0–15.0)
Potassium: 3.6 mmol/L (ref 3.5–5.1)
Sodium: 140 mmol/L (ref 135–145)
TCO2: 22 mmol/L (ref 22–32)

## 2019-12-14 LAB — COMPREHENSIVE METABOLIC PANEL
ALT: 18 U/L (ref 0–44)
AST: 26 U/L (ref 15–41)
Albumin: 3.8 g/dL (ref 3.5–5.0)
Alkaline Phosphatase: 77 U/L (ref 38–126)
Anion gap: 12 (ref 5–15)
BUN: 22 mg/dL (ref 8–23)
CO2: 21 mmol/L — ABNORMAL LOW (ref 22–32)
Calcium: 9.4 mg/dL (ref 8.9–10.3)
Chloride: 107 mmol/L (ref 98–111)
Creatinine, Ser: 0.87 mg/dL (ref 0.44–1.00)
GFR calc Af Amer: >60 mL/min (ref >60)
GFR calc non Af Amer: 60 mL/min — ABNORMAL LOW (ref >60)
Glucose, Bld: 131 mg/dL — ABNORMAL HIGH (ref 70–99)
Potassium: 3.7 mmol/L (ref 3.5–5.1)
Sodium: 140 mmol/L (ref 135–145)
Total Bilirubin: 0.9 mg/dL (ref 0.3–1.2)
Total Protein: 6.3 g/dL — ABNORMAL LOW (ref 6.5–8.1)

## 2019-12-14 LAB — SAMPLE TO BLOOD BANK

## 2019-12-14 LAB — PROTIME-INR
INR: 1.1 (ref 0.8–1.2)
Prothrombin Time: 13.7 seconds (ref 11.4–15.2)

## 2019-12-14 LAB — LACTIC ACID, PLASMA: Lactic Acid, Venous: 1.5 mmol/L (ref 0.5–1.9)

## 2019-12-14 LAB — SARS CORONAVIRUS 2 BY RT PCR (HOSPITAL ORDER, PERFORMED IN ~~LOC~~ HOSPITAL LAB): SARS Coronavirus 2: NEGATIVE

## 2019-12-14 MED ORDER — ACETAMINOPHEN 325 MG PO TABS
650.0000 mg | ORAL_TABLET | Freq: Four times a day (QID) | ORAL | Status: DC
  Administered 2019-12-14 – 2019-12-17 (×8): 650 mg via ORAL
  Filled 2019-12-14 (×8): qty 2

## 2019-12-14 MED ORDER — EZETIMIBE 10 MG PO TABS
10.0000 mg | ORAL_TABLET | Freq: Every day | ORAL | Status: DC
  Administered 2019-12-16 – 2019-12-23 (×8): 10 mg via ORAL
  Filled 2019-12-14 (×9): qty 1

## 2019-12-14 MED ORDER — FENTANYL CITRATE (PF) 100 MCG/2ML IJ SOLN
50.0000 ug | Freq: Once | INTRAMUSCULAR | Status: AC
  Administered 2019-12-14: 50 ug via INTRAVENOUS
  Filled 2019-12-14: qty 2

## 2019-12-14 MED ORDER — HYDROMORPHONE HCL 1 MG/ML IJ SOLN
0.5000 mg | INTRAMUSCULAR | Status: DC | PRN
  Administered 2019-12-15 – 2019-12-21 (×8): 0.5 mg via INTRAVENOUS
  Filled 2019-12-14 (×9): qty 1

## 2019-12-14 MED ORDER — LEVETIRACETAM 500 MG PO TABS
500.0000 mg | ORAL_TABLET | Freq: Two times a day (BID) | ORAL | Status: DC
  Administered 2019-12-14 – 2019-12-23 (×17): 500 mg via ORAL
  Filled 2019-12-14 (×17): qty 1

## 2019-12-14 MED ORDER — VITAMIN B-12 1000 MCG PO TABS
1000.0000 ug | ORAL_TABLET | Freq: Every day | ORAL | Status: DC
  Administered 2019-12-16 – 2019-12-23 (×8): 1000 ug via ORAL
  Filled 2019-12-14 (×8): qty 1

## 2019-12-14 MED ORDER — NITROGLYCERIN 0.4 MG SL SUBL: 0.4000 mg | SUBLINGUAL_TABLET | SUBLINGUAL | Status: DC | PRN

## 2019-12-14 MED ORDER — ACIDOPHILUS PO TABS: 1.0000 | ORAL_TABLET | Freq: Every morning | ORAL | Status: DC

## 2019-12-14 MED ORDER — OXYBUTYNIN CHLORIDE 5 MG PO TABS
5.0000 mg | ORAL_TABLET | Freq: Every day | ORAL | Status: DC
  Administered 2019-12-16 – 2019-12-23 (×8): 5 mg via ORAL
  Filled 2019-12-14 (×9): qty 1

## 2019-12-14 MED ORDER — DIPHENHYDRAMINE HCL 25 MG PO CAPS: 25.0000 mg | ORAL_CAPSULE | Freq: Four times a day (QID) | ORAL | Status: DC | PRN

## 2019-12-14 MED ORDER — AMLODIPINE BESYLATE 10 MG PO TABS
10.0000 mg | ORAL_TABLET | Freq: Every day | ORAL | Status: DC
  Administered 2019-12-16 – 2019-12-23 (×8): 10 mg via ORAL
  Filled 2019-12-14 (×8): qty 1

## 2019-12-14 MED ORDER — ONDANSETRON HCL 4 MG/2ML IJ SOLN: 4.0000 mg | Freq: Four times a day (QID) | INTRAMUSCULAR | Status: DC | PRN

## 2019-12-14 MED ORDER — IOHEXOL 300 MG/ML  SOLN
100.0000 mL | Freq: Once | INTRAMUSCULAR | Status: AC | PRN
  Administered 2019-12-14: 100 mL via INTRAVENOUS

## 2019-12-14 MED ORDER — RISAQUAD PO CAPS
1.0000 | ORAL_CAPSULE | Freq: Every day | ORAL | Status: DC
  Administered 2019-12-16 – 2019-12-23 (×8): 1 via ORAL
  Filled 2019-12-14 (×9): qty 1

## 2019-12-14 MED ORDER — DIPHENOXYLATE-ATROPINE 2.5-0.025 MG PO TABS
1.0000 | ORAL_TABLET | Freq: Four times a day (QID) | ORAL | Status: DC | PRN
  Filled 2019-12-14: qty 1

## 2019-12-14 MED ORDER — ONDANSETRON 4 MG PO TBDP: 4.0000 mg | ORAL_TABLET | Freq: Four times a day (QID) | ORAL | Status: DC | PRN

## 2019-12-14 MED ORDER — IBUPROFEN 400 MG PO TABS
400.0000 mg | ORAL_TABLET | Freq: Once | ORAL | Status: AC | PRN
  Administered 2019-12-14: 400 mg via ORAL

## 2019-12-14 MED ORDER — IBUPROFEN 600 MG PO TABS: 600.0000 mg | ORAL_TABLET | Freq: Four times a day (QID) | ORAL | Status: DC | PRN

## 2019-12-14 MED ORDER — LACTATED RINGERS IV SOLN
INTRAVENOUS | Status: DC
  Administered 2019-12-14: 75 mL/h via INTRAVENOUS

## 2019-12-14 MED ORDER — IRBESARTAN 150 MG PO TABS
150.0000 mg | ORAL_TABLET | Freq: Every day | ORAL | Status: DC
  Administered 2019-12-16 – 2019-12-23 (×8): 150 mg via ORAL
  Filled 2019-12-14 (×8): qty 1

## 2019-12-14 MED ORDER — OXYCODONE HCL 5 MG PO TABS
5.0000 mg | ORAL_TABLET | Freq: Four times a day (QID) | ORAL | Status: DC | PRN
  Administered 2019-12-14 – 2019-12-22 (×8): 5 mg via ORAL
  Filled 2019-12-14 (×9): qty 1
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
## 2019-12-14 NOTE — ED Notes (Signed)
Patient is resting comfortably. 
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
## 2019-12-14 NOTE — Progress Notes (Signed)
Orthopedic Tech Progress Note Patient Details:  [name] 1929-08-30 [id number]  Ortho Devices Type of Ortho Device: Knee Immobilizer Ortho Device/Splint Location: Right Lower Extremity Ortho Device/Splint Interventions: Ordered, Application   Post Interventions Patient Tolerated: Well Instructions Provided: Adjustment of device, Care of device, Poper ambulation with device   [name] Allie Dimmer 12/14/2019, 4:08 PM

## 2019-12-15 ENCOUNTER — Inpatient Hospital Stay (HOSPITAL_COMMUNITY): Payer: Medicare Other | Admitting: Anesthesiology

## 2019-12-15 ENCOUNTER — Encounter (HOSPITAL_COMMUNITY): Admission: EM | Disposition: A | Discharge status: Skilled Nursing Facility | Payer: Self-pay | Source: Home / Self Care

## 2019-12-15 ENCOUNTER — Inpatient Hospital Stay (HOSPITAL_COMMUNITY): Payer: Medicare Other

## 2019-12-15 ENCOUNTER — Encounter (HOSPITAL_COMMUNITY): Payer: Self-pay | Admitting: Surgery

## 2019-12-15 DIAGNOSIS — S72401A Unspecified fracture of lower end of right femur, initial encounter for closed fracture: Secondary | ICD-10-CM

## 2019-12-15 HISTORY — PX: ORIF FEMUR FRACTURE: SHX2119

## 2019-12-15 LAB — BASIC METABOLIC PANEL
Anion gap: 7 (ref 5–15)
BUN: 19 mg/dL (ref 8–23)
CO2: 25 mmol/L (ref 22–32)
Calcium: 8.9 mg/dL (ref 8.9–10.3)
Chloride: 107 mmol/L (ref 98–111)
Creatinine, Ser: 0.75 mg/dL (ref 0.44–1.00)
GFR calc Af Amer: >60 mL/min (ref >60)
GFR calc non Af Amer: >60 mL/min (ref >60)
Glucose, Bld: 117 mg/dL — ABNORMAL HIGH (ref 70–99)
Potassium: 3.8 mmol/L (ref 3.5–5.1)
Sodium: 139 mmol/L (ref 135–145)

## 2019-12-15 LAB — CBC
HCT: 29.2 % — ABNORMAL LOW (ref 36.0–46.0)
Hemoglobin: 9.2 g/dL — ABNORMAL LOW (ref 12.0–15.0)
MCH: 30.8 pg (ref 26.0–34.0)
MCHC: 31.5 g/dL (ref 30.0–36.0)
MCV: 97.7 fL (ref 80.0–100.0)
Platelets: 132 10*3/uL — ABNORMAL LOW (ref 150–400)
RBC: 2.99 MIL/uL — ABNORMAL LOW (ref 3.87–5.11)
RDW: 13.4 % (ref 11.5–15.5)
WBC: 6.4 10*3/uL (ref 4.0–10.5)
nRBC: 0 % (ref 0.0–0.2)

## 2019-12-15 LAB — TYPE AND SCREEN
ABO/RH(D): O POS
Antibody Screen: NEGATIVE

## 2019-12-15 LAB — ABO/RH: ABO/RH(D): O POS

## 2019-12-15 SURGERY — OPEN REDUCTION INTERNAL FIXATION (ORIF) DISTAL FEMUR FRACTURE
Anesthesia: General | Laterality: Right

## 2019-12-15 MED ORDER — ONDANSETRON HCL 4 MG PO TABS: 4.0000 mg | ORAL_TABLET | Freq: Four times a day (QID) | ORAL | Status: DC | PRN

## 2019-12-15 MED ORDER — METHOCARBAMOL 1000 MG/10ML IJ SOLN
500.0000 mg | Freq: Four times a day (QID) | INTRAVENOUS | Status: DC | PRN
  Filled 2019-12-15: qty 5

## 2019-12-15 MED ORDER — POLYETHYLENE GLYCOL 3350 17 G PO PACK: 17.0000 g | PACK | Freq: Every day | ORAL | Status: DC | PRN

## 2019-12-15 MED ORDER — LIDOCAINE 2% (20 MG/ML) 5 ML SYRINGE
INTRAMUSCULAR | Status: DC | PRN
  Administered 2019-12-15: 60 mg via INTRAVENOUS

## 2019-12-15 MED ORDER — OXYCODONE HCL 5 MG PO TABS: 5.0000 mg | ORAL_TABLET | Freq: Once | ORAL | Status: DC | PRN

## 2019-12-15 MED ORDER — PHENYLEPHRINE 40 MCG/ML (10ML) SYRINGE FOR IV PUSH (FOR BLOOD PRESSURE SUPPORT)
PREFILLED_SYRINGE | INTRAVENOUS | Status: DC | PRN
  Administered 2019-12-15: 80 ug via INTRAVENOUS

## 2019-12-15 MED ORDER — PROPOFOL 10 MG/ML IV BOLUS
INTRAVENOUS | Status: AC
  Filled 2019-12-15: qty 20

## 2019-12-15 MED ORDER — LIDOCAINE 2% (20 MG/ML) 5 ML SYRINGE
INTRAMUSCULAR | Status: AC
  Filled 2019-12-15: qty 5

## 2019-12-15 MED ORDER — TOBRAMYCIN SULFATE 1.2 G IJ SOLR
INTRAMUSCULAR | Status: AC
  Filled 2019-12-15: qty 1.2

## 2019-12-15 MED ORDER — ONDANSETRON HCL 4 MG/2ML IJ SOLN
INTRAMUSCULAR | Status: AC
  Filled 2019-12-15: qty 2

## 2019-12-15 MED ORDER — ROCURONIUM BROMIDE 10 MG/ML (PF) SYRINGE
PREFILLED_SYRINGE | INTRAVENOUS | Status: DC | PRN
  Administered 2019-12-15: 50 mg via INTRAVENOUS

## 2019-12-15 MED ORDER — METHOCARBAMOL 500 MG PO TABS
500.0000 mg | ORAL_TABLET | Freq: Four times a day (QID) | ORAL | Status: DC | PRN
  Administered 2019-12-16 – 2019-12-22 (×8): 500 mg via ORAL
  Filled 2019-12-15 (×8): qty 1

## 2019-12-15 MED ORDER — PROPOFOL 10 MG/ML IV BOLUS
INTRAVENOUS | Status: DC | PRN
  Administered 2019-12-15: 80 mg via INTRAVENOUS

## 2019-12-15 MED ORDER — SUGAMMADEX SODIUM 200 MG/2ML IV SOLN
INTRAVENOUS | Status: DC | PRN
  Administered 2019-12-15: 200 mg via INTRAVENOUS

## 2019-12-15 MED ORDER — CEFAZOLIN SODIUM-DEXTROSE 2-4 GM/100ML-% IV SOLN
2.0000 g | INTRAVENOUS | Status: AC
  Administered 2019-12-15: 2 g via INTRAVENOUS
  Filled 2019-12-15: qty 100

## 2019-12-15 MED ORDER — FENTANYL CITRATE (PF) 250 MCG/5ML IJ SOLN
INTRAMUSCULAR | Status: DC | PRN
Start: 2019-12-15 — End: 2019-12-15
  Administered 2019-12-15: 25 ug via INTRAVENOUS
  Administered 2019-12-15: 50 ug via INTRAVENOUS
  Administered 2019-12-15 (×3): 25 ug via INTRAVENOUS

## 2019-12-15 MED ORDER — DOCUSATE SODIUM 100 MG PO CAPS
100.0000 mg | ORAL_CAPSULE | Freq: Two times a day (BID) | ORAL | Status: DC
  Administered 2019-12-15 – 2019-12-23 (×16): 100 mg via ORAL
  Filled 2019-12-15 (×16): qty 1

## 2019-12-15 MED ORDER — LACTATED RINGERS IV SOLN: INTRAVENOUS | Status: DC | PRN

## 2019-12-15 MED ORDER — FENTANYL CITRATE (PF) 250 MCG/5ML IJ SOLN
INTRAMUSCULAR | Status: AC
  Filled 2019-12-15: qty 5

## 2019-12-15 MED ORDER — VANCOMYCIN HCL 1000 MG IV SOLR
INTRAVENOUS | Status: AC
  Filled 2019-12-15: qty 1000

## 2019-12-15 MED ORDER — DEXAMETHASONE SODIUM PHOSPHATE 10 MG/ML IJ SOLN
INTRAMUSCULAR | Status: DC | PRN
  Administered 2019-12-15: 5 mg via INTRAVENOUS

## 2019-12-15 MED ORDER — PHENYLEPHRINE HCL-NACL 10-0.9 MG/250ML-% IV SOLN
INTRAVENOUS | Status: DC | PRN
  Administered 2019-12-15: 25 ug/min via INTRAVENOUS

## 2019-12-15 MED ORDER — FENTANYL CITRATE (PF) 100 MCG/2ML IJ SOLN
INTRAMUSCULAR | Status: AC
  Administered 2019-12-15: 25 ug via INTRAVENOUS
  Filled 2019-12-15: qty 2

## 2019-12-15 MED ORDER — BUPIVACAINE-EPINEPHRINE (PF) 0.5% -1:200000 IJ SOLN
INTRAMUSCULAR | Status: DC | PRN
  Administered 2019-12-15: 30 mL via PERINEURAL

## 2019-12-15 MED ORDER — 0.9 % SODIUM CHLORIDE (POUR BTL) OPTIME
TOPICAL | Status: DC | PRN
  Administered 2019-12-15: 1000 mL

## 2019-12-15 MED ORDER — DEXAMETHASONE SODIUM PHOSPHATE 10 MG/ML IJ SOLN
INTRAMUSCULAR | Status: AC
  Filled 2019-12-15: qty 1

## 2019-12-15 MED ORDER — OXYCODONE HCL 5 MG/5ML PO SOLN: 5.0000 mg | Freq: Once | ORAL | Status: DC | PRN

## 2019-12-15 MED ORDER — ONDANSETRON HCL 4 MG/2ML IJ SOLN: 4.0000 mg | Freq: Four times a day (QID) | INTRAMUSCULAR | Status: DC | PRN

## 2019-12-15 MED ORDER — METOCLOPRAMIDE HCL 5 MG/ML IJ SOLN: 5.0000 mg | Freq: Three times a day (TID) | INTRAMUSCULAR | Status: DC | PRN

## 2019-12-15 MED ORDER — ORAL CARE MOUTH RINSE: 15.0000 mL | Freq: Once | OROMUCOSAL | Status: AC

## 2019-12-15 MED ORDER — ONDANSETRON HCL 4 MG/2ML IJ SOLN
INTRAMUSCULAR | Status: DC | PRN
  Administered 2019-12-15: 4 mg via INTRAVENOUS

## 2019-12-15 MED ORDER — CHLORHEXIDINE GLUCONATE 4 % EX LIQD
60.0000 mL | Freq: Once | CUTANEOUS | Status: DC
  Filled 2019-12-15: qty 60

## 2019-12-15 MED ORDER — LACTATED RINGERS IV SOLN: INTRAVENOUS | Status: DC

## 2019-12-15 MED ORDER — CEFAZOLIN SODIUM-DEXTROSE 2-4 GM/100ML-% IV SOLN
2.0000 g | Freq: Three times a day (TID) | INTRAVENOUS | Status: AC
  Administered 2019-12-16 (×3): 2 g via INTRAVENOUS
  Filled 2019-12-15 (×3): qty 100

## 2019-12-15 MED ORDER — CHLORHEXIDINE GLUCONATE 0.12 % MT SOLN: 15.0000 mL | Freq: Once | OROMUCOSAL | Status: AC

## 2019-12-15 MED ORDER — VANCOMYCIN HCL 1000 MG IV SOLR
INTRAVENOUS | Status: DC | PRN
  Administered 2019-12-15: 1000 mg via TOPICAL

## 2019-12-15 MED ORDER — ONDANSETRON HCL 4 MG/2ML IJ SOLN: 4.0000 mg | Freq: Once | INTRAMUSCULAR | Status: DC | PRN

## 2019-12-15 MED ORDER — FENTANYL CITRATE (PF) 100 MCG/2ML IJ SOLN: 25.0000 ug | INTRAMUSCULAR | Status: DC | PRN

## 2019-12-15 MED ORDER — CHLORHEXIDINE GLUCONATE 0.12 % MT SOLN
OROMUCOSAL | Status: AC
  Administered 2019-12-15: 15 mL via OROMUCOSAL
  Filled 2019-12-15: qty 15

## 2019-12-15 MED ORDER — ENOXAPARIN SODIUM 40 MG/0.4ML ~~LOC~~ SOLN: 40.0000 mg | SUBCUTANEOUS | Status: DC

## 2019-12-15 MED ORDER — ROCURONIUM BROMIDE 10 MG/ML (PF) SYRINGE
PREFILLED_SYRINGE | INTRAVENOUS | Status: AC
  Filled 2019-12-15: qty 10

## 2019-12-15 MED ORDER — METOCLOPRAMIDE HCL 5 MG PO TABS: 5.0000 mg | ORAL_TABLET | Freq: Three times a day (TID) | ORAL | Status: DC | PRN

## 2019-12-15 MED ORDER — POVIDONE-IODINE 10 % EX SWAB
2.0000 "application " | Freq: Once | CUTANEOUS | Status: AC
  Administered 2019-12-15: 2 via TOPICAL

## 2019-12-15 SURGICAL SUPPLY — 73 items
APL PRP STRL LF DISP 70% ISPRP (MISCELLANEOUS) ×1
BIT DRILL 4.3 (BIT) ×2
BIT DRILL 4.3MM (BIT) ×1
BIT DRILL 4.3X300MM (BIT) ×1 IMPLANT
BIT DRILL LONG 3.3 (BIT) ×2 IMPLANT
BIT DRILL LONG 3.3MM (BIT) ×1
BIT DRILL QC 3.3X195 (BIT) ×3 IMPLANT
BLADE CLIPPER SURG (BLADE) IMPLANT
BNDG CMPR MED 10X6 ELC LF (GAUZE/BANDAGES/DRESSINGS) ×1
BNDG COHESIVE 6X5 TAN STRL LF (GAUZE/BANDAGES/DRESSINGS) ×3 IMPLANT
BNDG ELASTIC 6X10 VLCR STRL LF (GAUZE/BANDAGES/DRESSINGS) ×3 IMPLANT
BNDG GAUZE ELAST 4 BULKY (GAUZE/BANDAGES/DRESSINGS) ×3 IMPLANT
BRUSH SCRUB EZ PLAIN DRY (MISCELLANEOUS) ×6 IMPLANT
CANISTER SUCT 3000ML PPV (MISCELLANEOUS) ×3 IMPLANT
CAP LOCK NCB (Cap) ×21 IMPLANT
CHLORAPREP W/TINT 26 (MISCELLANEOUS) ×3 IMPLANT
COVER SURGICAL LIGHT HANDLE (MISCELLANEOUS) ×3 IMPLANT
COVER WAND RF STERILE (DRAPES) ×3 IMPLANT
DRAPE C-ARM 42X72 X-RAY (DRAPES) ×3 IMPLANT
DRAPE C-ARMOR (DRAPES) ×3 IMPLANT
DRAPE HALF SHEET 40X57 (DRAPES) ×6 IMPLANT
DRAPE ORTHO SPLIT 77X108 STRL (DRAPES) ×6
DRAPE SURG 17X23 STRL (DRAPES) ×3 IMPLANT
DRAPE SURG ORHT 6 SPLT 77X108 (DRAPES) ×2 IMPLANT
DRAPE U-SHAPE 47X51 STRL (DRAPES) ×3 IMPLANT
DRSG ADAPTIC 3X8 NADH LF (GAUZE/BANDAGES/DRESSINGS) IMPLANT
DRSG MEPILEX BORDER 4X12 (GAUZE/BANDAGES/DRESSINGS) IMPLANT
DRSG MEPILEX BORDER 4X4 (GAUZE/BANDAGES/DRESSINGS) IMPLANT
DRSG MEPILEX BORDER 4X8 (GAUZE/BANDAGES/DRESSINGS) ×3 IMPLANT
DRSG PAD ABDOMINAL 8X10 ST (GAUZE/BANDAGES/DRESSINGS) ×9 IMPLANT
ELECT REM PT RETURN 9FT ADLT (ELECTROSURGICAL) ×3
ELECTRODE REM PT RTRN 9FT ADLT (ELECTROSURGICAL) ×1 IMPLANT
GAUZE SPONGE 4X4 12PLY STRL (GAUZE/BANDAGES/DRESSINGS) ×3 IMPLANT
GLOVE BIO SURGEON STRL SZ 6.5 (GLOVE) ×6 IMPLANT
GLOVE BIO SURGEON STRL SZ7.5 (GLOVE) ×12 IMPLANT
GLOVE BIO SURGEONS STRL SZ 6.5 (GLOVE) ×3
GLOVE BIOGEL PI IND STRL 6.5 (GLOVE) ×1 IMPLANT
GLOVE BIOGEL PI IND STRL 7.5 (GLOVE) ×1 IMPLANT
GLOVE BIOGEL PI INDICATOR 6.5 (GLOVE) ×2
GLOVE BIOGEL PI INDICATOR 7.5 (GLOVE) ×2
GOWN STRL REUS W/ TWL LRG LVL3 (GOWN DISPOSABLE) ×3 IMPLANT
GOWN STRL REUS W/TWL LRG LVL3 (GOWN DISPOSABLE) ×9
K-WIRE 2.0 (WIRE) ×3
K-WIRE FXSTD 280X2XNS SS (WIRE) ×1
KIT BASIN OR (CUSTOM PROCEDURE TRAY) ×3 IMPLANT
KIT TURNOVER KIT B (KITS) ×3 IMPLANT
KWIRE FXSTD 280X2XNS SS (WIRE) ×1 IMPLANT
NS IRRIG 1000ML POUR BTL (IV SOLUTION) ×3 IMPLANT
PACK TOTAL JOINT (CUSTOM PROCEDURE TRAY) ×3 IMPLANT
PAD ARMBOARD 7.5X6 YLW CONV (MISCELLANEOUS) ×6 IMPLANT
PAD CAST 4YDX4 CTTN HI CHSV (CAST SUPPLIES) ×1 IMPLANT
PADDING CAST COTTON 4X4 STRL (CAST SUPPLIES) ×3
PADDING CAST COTTON 6X4 STRL (CAST SUPPLIES) ×3 IMPLANT
PLATE FEM DIST NCB PP 278MM (Plate) ×3 IMPLANT
SCREW 5.0 80MM (Screw) ×12 IMPLANT
SCREW NCB 4.0MX34M (Screw) ×3 IMPLANT
SCREW NCB 5.0X36MM (Screw) ×6 IMPLANT
SCREW NCB 5.0X85MM (Screw) ×3 IMPLANT
SPONGE LAP 18X18 RF (DISPOSABLE) IMPLANT
STAPLER VISISTAT 35W (STAPLE) ×3 IMPLANT
SUCTION FRAZIER HANDLE 10FR (MISCELLANEOUS) ×3
SUCTION TUBE FRAZIER 10FR DISP (MISCELLANEOUS) ×1 IMPLANT
SUT ETHILON 3 0 PS 1 (SUTURE) ×6 IMPLANT
SUT MNCRL AB 3-0 PS2 18 (SUTURE) ×6 IMPLANT
SUT VIC AB 0 CT1 27 (SUTURE)
SUT VIC AB 0 CT1 27XBRD ANBCTR (SUTURE) IMPLANT
SUT VIC AB 1 CT1 27 (SUTURE)
SUT VIC AB 1 CT1 27XBRD ANBCTR (SUTURE) IMPLANT
SUT VIC AB 2-0 CT1 27 (SUTURE) ×6
SUT VIC AB 2-0 CT1 TAPERPNT 27 (SUTURE) ×2 IMPLANT
TOWEL GREEN STERILE (TOWEL DISPOSABLE) ×6 IMPLANT
TRAY FOLEY MTR SLVR 16FR STAT (SET/KITS/TRAYS/PACK) IMPLANT
WATER STERILE IRR 1000ML POUR (IV SOLUTION) ×6 IMPLANT
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
## 2019-12-15 NOTE — ED Notes (Signed)
Op- permit signed.

## 2019-12-15 NOTE — ED Notes (Signed)
Niece called at patient request and update given [phone], patient also spoke with patient

## 2019-12-15 NOTE — Progress Notes (Signed)
PT Cancellation Note  Patient Details Name: [name] MRN: [id number] DOB: [birth year]   Cancelled Treatment:    Reason Eval/Treat Not Completed: Patient not medically ready (pt with fx and awaiting ORIF with post op clearance prior to eval)   Jordanne Elsbury B Bernardo Brayman 12/15/2019, 11:10 AM  Bayard Males, PT Acute Rehabilitation Services Pager: [phone] Office: [phone]

## 2019-12-15 NOTE — Anesthesia Preprocedure Evaluation (Addendum)
Anesthesia Evaluation  Patient identified by MRN, date of birth, ID band Patient awake    Reviewed: Allergy & Precautions, NPO status , Patient's Chart, lab work & pertinent test results  History of Anesthesia Complications Negative for: history of anesthetic complications  Airway Mallampati: III  TM Distance: >3 FB Neck ROM: Full    Dental  (+) Dental Advisory Given, Partial Upper   Pulmonary sleep apnea and Continuous Positive Airway Pressure Ventilation ,    Pulmonary exam normal        Cardiovascular hypertension, Pt. on medications + CAD and + Cardiac Stents  Normal cardiovascular exam   '20 TTE - EF 65% to 70%. LV diastolic function parameters were normal. LA was moderately dilated. Mild TR.    Neuro/Psych Seizures -, Well Controlled,  negative psych ROS   GI/Hepatic negative GI ROS, Neg liver ROS,   Endo/Other  negative endocrine ROS  Renal/GU negative Renal ROS     Musculoskeletal  (+) Arthritis ,  Right distal femur fx Sternal fx Left 2nd rib fx, possible right 7-10 rib fxs T1+T2 vertebral body fxs   Abdominal   Peds  Hematology  (+) anemia ,  Plt 132k Splenic laceration    Anesthesia Other Findings Covid test negative   Reproductive/Obstetrics                            Anesthesia Physical Anesthesia Plan  ASA: III  Anesthesia Plan: General   Post-op Pain Management:    Induction: Intravenous  PONV Risk Score and Plan: 3 and Treatment may vary due to age or medical condition, Ondansetron and Propofol infusion  Airway Management Planned: Oral ETT  Additional Equipment: None  Intra-op Plan:   Post-operative Plan: Extubation in OR  Informed Consent: I have reviewed the patients History and Physical, chart, labs and discussed the procedure including the risks, benefits and alternatives for the proposed anesthesia with the patient or authorized representative who  has indicated his/her understanding and acceptance.     Dental advisory given  Plan Discussed with: CRNA and Anesthesiologist  Anesthesia Plan Comments:        Anesthesia Quick Evaluation
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
## 2019-12-15 NOTE — Anesthesia Procedure Notes (Signed)
Procedure Name: Intubation Date/Time: 12/15/2019 4:39 PM Performed by: Kathryne Hitch, CRNA Pre-anesthesia Checklist: Patient identified, Emergency Drugs available, Suction available and Patient being monitored Patient Re-evaluated:Patient Re-evaluated prior to induction Oxygen Delivery Method: Circle system utilized Preoxygenation: Pre-oxygenation with 100% oxygen Induction Type: IV induction Ventilation: Mask ventilation without difficulty Laryngoscope Size: Miller and 2 Grade View: Grade I Tube type: Oral Number of attempts: 1 Airway Equipment and Method: Stylet and Oral airway Placement Confirmation: ETT inserted through vocal cords under direct vision,  positive ETCO2 and breath sounds checked- equal and bilateral Secured at: 21 cm Tube secured with: Tape Dental Injury: Teeth and Oropharynx as per pre-operative assessment

## 2019-12-15 NOTE — Anesthesia Procedure Notes (Signed)
Anesthesia Regional Block: Adductor canal block   Pre-Anesthetic Checklist: ,, timeout performed, Correct Patient, Correct Site, Correct Laterality, Correct Procedure, Correct Position, site marked, Risks and benefits discussed,  Surgical consent,  Pre-op evaluation,  At surgeon's request and post-op pain management  Laterality: Right  Prep: chloraprep       Needles:  Injection technique: Single-shot  Needle Type: Stimiplex     Needle Length: 9cm  Needle Gauge: 21     Additional Needles:   Procedures:,,,, ultrasound used (permanent image in chart),,,,  Narrative:  Start time: 12/15/2019 4:25 PM End time: 12/15/2019 4:30 PM Injection made incrementally with aspirations every 5 mL.  Performed by: Personally  Anesthesiologist: Nolon Nations, MD  Additional Notes: BP cuff, EKG monitors applied. Sedation begun. Artery and nerve location verified with U/S and anesthetic injected incrementally, slowly, and after negative aspirations under direct u/s guidance. Good fascial /perineural spread. Tolerated well.
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
## 2019-12-15 NOTE — Transfer of Care (Signed)
Immediate Anesthesia Transfer of Care Note  Patient: [name]  Procedure(s) Performed: OPEN REDUCTION INTERNAL FIXATION (ORIF) DISTAL FEMUR FRACTURE (Right )  Patient Location: PACU  Anesthesia Type:General  Level of Consciousness: drowsy and patient cooperative  Airway & Oxygen Therapy: Patient Spontanous Breathing and Patient connected to nasal cannula oxygen  Post-op Assessment: Report given to RN and Post -op Vital signs reviewed and stable  Post vital signs: Reviewed and stable  Last Vitals:  Vitals Value Taken Time  BP 142/61 12/15/19 1748  Temp    Pulse 97 12/15/19 1751  Resp 9 12/15/19 1751  SpO2 96 % 12/15/19 1751  Vitals shown include unvalidated device data.  Last Pain:  Vitals:   12/15/19 0239  TempSrc:   PainSc: Asleep         Complications: No complications documented.

## 2019-12-15 NOTE — Consult Note (Signed)
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 approximately 2 cm and 1/2 shaft width posterior displacement of the femoral condyles. Possible small fracture of the inferior pole of the patella is noted. No other fracture is identified. Advanced degenerative disease about the knee is noted. A 1.3 cm calcification in Hoffa's infrapatellar fat is likely a loose body. IMPRESSION: Impacted and posteriorly displaced metaphyseal fracture of the distal femur. The femur is otherwise intact. Possible fracture of the inferior pole of the patella. Right knee osteoarthritis. Electronically Signed   By: Inge Rise M.D.   On: 12/14/2019 17:16    Review of Systems  HENT: Negative for ear discharge, ear pain, hearing loss and tinnitus.   Eyes: Negative for photophobia and pain.  Respiratory: Negative for cough and shortness of breath.   Cardiovascular: Negative for chest pain.  Gastrointestinal: Negative for abdominal pain, nausea and vomiting.  Genitourinary: Negative for dysuria, flank pain, frequency and urgency.   Musculoskeletal: Positive for arthralgias (Right knee). Negative  for back pain, myalgias and neck pain.  Neurological: Negative for dizziness and headaches.  Hematological: Does not bruise/bleed easily.  Psychiatric/Behavioral: The patient is not nervous/anxious.    Blood pressure (!) 150/56, pulse 68, temperature (!) 97.5 F (36.4 C), temperature source Oral, resp. rate 13, height 4' 10.5" (1.486 m), weight 54 kg, SpO2 95 %. Physical Exam Constitutional:      General: She is not in acute distress.    Appearance: She is well-developed. She is not diaphoretic.  HENT:     Head: Normocephalic and atraumatic.  Eyes:     General: No scleral icterus.       Right eye: No discharge.        Left eye: No discharge.     Conjunctiva/sclera: Conjunctivae normal.  Cardiovascular:     Rate and Rhythm: Normal rate and regular rhythm.  Pulmonary:     Effort: Pulmonary effort is normal. No respiratory distress.  Musculoskeletal:     Cervical back: Normal range of motion.     Comments: RLE No traumatic wounds, ecchymosis, or rash  Severe TTP knee, KI in place  No ankle effusion  Sens DPN, SPN, TN intact  Motor EHL, ext, flex, evers 5/5  DP 2+, PT 1+, No significant edema  Skin:    General: Skin is warm and dry.  Neurological:     Mental Status: She is alert.  Psychiatric:        Behavior: Behavior normal.     Assessment/Plan: Right distal femur fx -- Plan ORIF this afternoon by Dr. Doreatha Martin. Please keep NPO. Other injuries including sternal/rib fxs, t-spine fxs, and possible splenic lac  Multiple medical problems including chronic anemia, seizure d/o, HTN, HLD, and CAD    Gwendolyn Abu, PA-C Orthopedic Surgery [phone] 12/15/2019, 8:42 AM

## 2019-12-16 ENCOUNTER — Encounter (HOSPITAL_COMMUNITY): Payer: Self-pay | Admitting: Student

## 2019-12-16 LAB — CBC
HCT: 27.2 % — ABNORMAL LOW (ref 36.0–46.0)
HCT: 24.6 % — ABNORMAL LOW (ref 36.0–46.0)
Hemoglobin: 8.8 g/dL — ABNORMAL LOW (ref 12.0–15.0)
Hemoglobin: 8 g/dL — ABNORMAL LOW (ref 12.0–15.0)
MCH: 31.4 pg (ref 26.0–34.0)
MCH: 31.3 pg (ref 26.0–34.0)
MCHC: 32.4 g/dL (ref 30.0–36.0)
MCHC: 32.5 g/dL (ref 30.0–36.0)
MCV: 97.1 fL (ref 80.0–100.0)
MCV: 96.1 fL (ref 80.0–100.0)
Platelets: 113 10*3/uL — ABNORMAL LOW (ref 150–400)
Platelets: 120 10*3/uL — ABNORMAL LOW (ref 150–400)
RBC: 2.8 MIL/uL — ABNORMAL LOW (ref 3.87–5.11)
RBC: 2.56 MIL/uL — ABNORMAL LOW (ref 3.87–5.11)
RDW: 13.2 % (ref 11.5–15.5)
RDW: 13.2 % (ref 11.5–15.5)
WBC: 7.6 10*3/uL (ref 4.0–10.5)
WBC: 6.9 10*3/uL (ref 4.0–10.5)
nRBC: 0 % (ref 0.0–0.2)
nRBC: 0 % (ref 0.0–0.2)

## 2019-12-16 LAB — VITAMIN D 25 HYDROXY (VIT D DEFICIENCY, FRACTURES): Vit D, 25-Hydroxy: 46.21 ng/mL (ref 30–100)

## 2019-12-16 LAB — BASIC METABOLIC PANEL
Anion gap: 13 (ref 5–15)
BUN: 16 mg/dL (ref 8–23)
CO2: 22 mmol/L (ref 22–32)
Calcium: 8.5 mg/dL — ABNORMAL LOW (ref 8.9–10.3)
Chloride: 104 mmol/L (ref 98–111)
Creatinine, Ser: 0.77 mg/dL (ref 0.44–1.00)
GFR calc Af Amer: >60 mL/min (ref >60)
GFR calc non Af Amer: >60 mL/min (ref >60)
Glucose, Bld: 114 mg/dL — ABNORMAL HIGH (ref 70–99)
Potassium: 4.2 mmol/L (ref 3.5–5.1)
Sodium: 139 mmol/L (ref 135–145)

## 2019-12-16 LAB — CREATININE, SERUM
Creatinine, Ser: 0.73 mg/dL (ref 0.44–1.00)
GFR calc Af Amer: >60 mL/min (ref >60)
GFR calc non Af Amer: >60 mL/min (ref >60)

## 2019-12-16 MED ORDER — ADULT MULTIVITAMIN W/MINERALS CH
1.0000 | ORAL_TABLET | Freq: Every day | ORAL | Status: DC
  Administered 2019-12-16 – 2019-12-23 (×8): 1 via ORAL
  Filled 2019-12-16 (×8): qty 1

## 2019-12-16 MED ORDER — ENSURE ENLIVE PO LIQD
237.0000 mL | Freq: Two times a day (BID) | ORAL | Status: DC
  Administered 2019-12-16 – 2019-12-23 (×12): 237 mL via ORAL
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
## 2019-12-16 NOTE — TOC CAGE-AID Note (Signed)
Transition of Care (TOC) - CAGE-AID Screening   Patient Details  Name: Renada A Somerville MRN: [id number] Date of Birth: 04/22/1930  Transition of Care (TOC) CM/SW Contact:      , LCSWA Phone Number: 12/16/2019, 10:55 AM   Clinical Narrative:  CSW met with pt at bedside. CSW introduced self and explained her role at the hospital.  Pt denies alcohol and substance use. No resources needed at this time.   CAGE-AID Screening:    Have You Ever Felt You Ought to Cut Down on Your Drinking or Drug Use?: No Have People Annoyed You By Critizing Your Drinking Or Drug Use?: No Have You Felt Bad Or Guilty About Your Drinking Or Drug Use?: No Have You Ever Had a Drink or Used Drugs First Thing In The Morning to Steady Your Nerves or to Get Rid of a Hangover?: No CAGE-AID Score: 0  Substance Abuse Education Offered: Yes      , LCSWA, LCASA Clinical Social Worker [phone]      
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
## 2019-12-16 NOTE — Progress Notes (Signed)
Orthopedic Tech Progress Note Patient Details:  [name] 1930-03-29 [id number] Called in order to [employer] for a TLSO BRACE Patient ID: [name], female   DOB: 05/29/1929, 86 y.o.   MRN: [id number]   [name] 12/16/2019, 8:29 AM
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
## 2019-12-16 NOTE — Anesthesia Postprocedure Evaluation (Signed)
Anesthesia Post Note  Patient: [name]  Procedure(s) Performed: OPEN REDUCTION INTERNAL FIXATION (ORIF) DISTAL FEMUR FRACTURE (Right )     Patient location during evaluation: PACU Anesthesia Type: General Level of consciousness: sedated and patient cooperative Pain management: pain level controlled Vital Signs Assessment: post-procedure vital signs reviewed and stable Respiratory status: spontaneous breathing Cardiovascular status: stable Anesthetic complications: no   No complications documented.  Last Vitals:  Vitals:   12/16/19 0759 12/16/19 1242  BP: (!) 115/41 (!) 109/47  Pulse: 75 84  Resp: 17   Temp: 36.5 C 36.5 C  SpO2: 99% 95%    Last Pain:  Vitals:   12/16/19 1242  TempSrc: Oral  PainSc:                  Nolon Nations

## 2019-12-16 NOTE — Progress Notes (Signed)
   Providing Compassionate, Quality Care - Together  NEUROSURGERY PROGRESS NOTE   S: No issues overnight. Min back pain noted  O: EXAM:  BP (!) 115/41 (BP Location: Left Arm)   Pulse 75   Temp 97.7 F (36.5 C) (Oral)   Resp 17   Ht 4' 10.5" (1.486 m)   Wt 54 kg   SpO2 99%   BMI 24.46 kg/m   Awake, alert, oriented  Speech fluent, appropriate  Full strength BUE/BLE, slight limitation on RLE due to femur fx repair yesterday Min TTP upper T spine midline   ASSESSMENT:  86 y.o. female with  1.  T1, T2 superior endplate compression fractures  Plan:  -No acute neurosurgical intervention -due to minimal back pain, I would not recommend a brace as it may increase her fall risk -Pain control -Activity as tolerated -Okay from my standpoint for PT OT evaluation -follow up as needed, will signoff at this time    Thank you for allowing me to participate in this patient's care.  Please do not hesitate to call with questions or concerns.   Elwin Sleight, [facility] Cell: [phone]
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
## 2019-12-17 DIAGNOSIS — S2220XA Unspecified fracture of sternum, initial encounter for closed fracture: Secondary | ICD-10-CM

## 2019-12-17 DIAGNOSIS — S36039A Unspecified laceration of spleen, initial encounter: Secondary | ICD-10-CM

## 2019-12-17 DIAGNOSIS — S22019A Unspecified fracture of first thoracic vertebra, initial encounter for closed fracture: Secondary | ICD-10-CM

## 2019-12-17 LAB — CBC
HCT: 22.6 % — ABNORMAL LOW (ref 36.0–46.0)
Hemoglobin: 7.2 g/dL — ABNORMAL LOW (ref 12.0–15.0)
MCH: 31.2 pg (ref 26.0–34.0)
MCHC: 31.9 g/dL (ref 30.0–36.0)
MCV: 97.8 fL (ref 80.0–100.0)
Platelets: 112 10*3/uL — ABNORMAL LOW (ref 150–400)
RBC: 2.31 MIL/uL — ABNORMAL LOW (ref 3.87–5.11)
RDW: 13.5 % (ref 11.5–15.5)
WBC: 7.1 10*3/uL (ref 4.0–10.5)
nRBC: 0 % (ref 0.0–0.2)

## 2019-12-17 LAB — BASIC METABOLIC PANEL
Anion gap: 6 (ref 5–15)
BUN: 21 mg/dL (ref 8–23)
CO2: 27 mmol/L (ref 22–32)
Calcium: 8.5 mg/dL — ABNORMAL LOW (ref 8.9–10.3)
Chloride: 105 mmol/L (ref 98–111)
Creatinine, Ser: 0.86 mg/dL (ref 0.44–1.00)
GFR calc Af Amer: >60 mL/min (ref >60)
GFR calc non Af Amer: >60 mL/min (ref >60)
Glucose, Bld: 122 mg/dL — ABNORMAL HIGH (ref 70–99)
Potassium: 4.1 mmol/L (ref 3.5–5.1)
Sodium: 138 mmol/L (ref 135–145)

## 2019-12-17 MED ORDER — ACETAMINOPHEN 325 MG PO TABS
325.0000 mg | ORAL_TABLET | Freq: Four times a day (QID) | ORAL | Status: DC | PRN
  Administered 2019-12-17 – 2019-12-21 (×4): 650 mg via ORAL
  Filled 2019-12-17 (×4): qty 2
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
## 2019-12-17 NOTE — TOC Progression Note (Signed)
Transition of Care [facility]) - Progression Note    Patient Details  Name: [name] MRN: [id number] Date of Birth: 31-Jan-1930  [facility] CM/SW Contact  Ella Bodo, RN Phone Number: 12/17/2019, 9:51 PM  Clinical Narrative:   Pt currently has no bed offers for SNF, likely due to liability concerns.  I spoke with pt; she states that she is not certain yet if her son will be able to stay with her at discharge.  She says that her niece may be able to help her; she will check with her this evening.  Should pt be able to secure 24h support at discharge, may consider CIR consult.  Will follow.     Expected Discharge Plan: [place] Barriers to Discharge: Continued Medical Work up  Expected Discharge Plan and Services Expected Discharge Plan: [place]   Discharge Planning Services: CM Consult Post Acute Care Choice: [place] Living arrangements for the past 2 months: Single Family Home                                       Social Determinants of Health (SDOH) Interventions    Readmission Risk Interventions No flowsheet data found. Reinaldo Raddle, RN, BSN  Trauma/Neuro ICU Case Manager [phone]
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
## 2019-12-18 LAB — CBC
HCT: 20.7 % — ABNORMAL LOW (ref 36.0–46.0)
Hemoglobin: 6.6 g/dL — CL (ref 12.0–15.0)
MCH: 31.7 pg (ref 26.0–34.0)
MCHC: 31.9 g/dL (ref 30.0–36.0)
MCV: 99.5 fL (ref 80.0–100.0)
Platelets: 118 10*3/uL — ABNORMAL LOW (ref 150–400)
RBC: 2.08 MIL/uL — ABNORMAL LOW (ref 3.87–5.11)
RDW: 13.6 % (ref 11.5–15.5)
WBC: 10 10*3/uL (ref 4.0–10.5)
nRBC: 0 % (ref 0.0–0.2)

## 2019-12-18 LAB — PREPARE RBC (CROSSMATCH)

## 2019-12-18 MED ORDER — SODIUM CHLORIDE 0.9% IV SOLUTION: Freq: Once | INTRAVENOUS | Status: AC

## 2019-12-18 NOTE — Progress Notes (Signed)
Rehab Admissions Coordinator Note:  Patient was screened by Cleatrice Burke for appropriateness for an Inpatient Acute Rehab Consult per PT and OT recs. Noted need to clarify if patient will have assistance after any rehab venue. If that is available, I would recommend CIR consult. Please clarify.Cleatrice Burke RN MSN 12/18/2019, 10:40 AM  I can be reached at [phone].
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
## 2019-12-19 LAB — TYPE AND SCREEN
ABO/RH(D): O POS
Antibody Screen: NEGATIVE
Unit division: 0
Unit division: 0

## 2019-12-19 LAB — BASIC METABOLIC PANEL
Anion gap: 9 (ref 5–15)
BUN: 17 mg/dL (ref 8–23)
CO2: 24 mmol/L (ref 22–32)
Calcium: 8.3 mg/dL — ABNORMAL LOW (ref 8.9–10.3)
Chloride: 103 mmol/L (ref 98–111)
Creatinine, Ser: 0.61 mg/dL (ref 0.44–1.00)
GFR calc Af Amer: >60 mL/min (ref >60)
GFR calc non Af Amer: >60 mL/min (ref >60)
Glucose, Bld: 116 mg/dL — ABNORMAL HIGH (ref 70–99)
Potassium: 4.3 mmol/L (ref 3.5–5.1)
Sodium: 136 mmol/L (ref 135–145)

## 2019-12-19 LAB — BPAM RBC
Blood Product Expiration Date: 202109242359
Blood Product Expiration Date: 202109242359
ISSUE DATE / TIME: 202108261217
ISSUE DATE / TIME: 202108261938
Unit Type and Rh: 5100
Unit Type and Rh: 5100

## 2019-12-19 LAB — CBC
HCT: 29.4 % — ABNORMAL LOW (ref 36.0–46.0)
Hemoglobin: 9.7 g/dL — ABNORMAL LOW (ref 12.0–15.0)
MCH: 31.1 pg (ref 26.0–34.0)
MCHC: 33 g/dL (ref 30.0–36.0)
MCV: 94.2 fL (ref 80.0–100.0)
Platelets: 123 10*3/uL — ABNORMAL LOW (ref 150–400)
RBC: 3.12 MIL/uL — ABNORMAL LOW (ref 3.87–5.11)
RDW: 14 % (ref 11.5–15.5)
WBC: 10.3 10*3/uL (ref 4.0–10.5)
nRBC: 0 % (ref 0.0–0.2)

## 2019-12-19 MED ORDER — ENOXAPARIN SODIUM 30 MG/0.3ML ~~LOC~~ SOLN
30.0000 mg | Freq: Two times a day (BID) | SUBCUTANEOUS | Status: DC
  Administered 2019-12-19 – 2019-12-23 (×8): 30 mg via SUBCUTANEOUS
  Filled 2019-12-19 (×9): qty 0.3
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
## 2019-12-19 NOTE — TOC Progression Note (Addendum)
Transition of Care [facility]) - Progression Note    Patient Details  Name: [name] MRN: [id number] Date of Birth: 19-Oct-1929  [facility] of [facility]) CM/SW Contact  Ella Bodo, RN Phone Number: 12/19/2019, 4:01 PM  Clinical Narrative: Met with patient to further discuss discharge options.  Patient wishes to discharge to SNF and not CIR, as she feels she cannot tolerate the required amount of therapy for inpatient rehab.  She prefers [facility] and rehab for discharge.  I have reached out to [facility], [place], [facility], and [place] in an attempt to secure a bed for patient.  Sac to review patient's case with corporate and get back to me.  Should [facility] decline, [facility] will be able to offer a bed on Monday pending insurance authorization.  Will follow for skilled nursing facility decisions.    Expected Discharge Plan: [place] Barriers to Discharge: Continued Medical Work up  Expected Discharge Plan and Services Expected Discharge Plan: [place]   Discharge Planning Services: CM Consult Post Acute Care Choice: [place] Living arrangements for the past 2 months: Single Family Home                                       Social Determinants of Health (SDOH) Interventions    Readmission Risk Interventions No flowsheet data found.  Reinaldo Raddle, RN, BSN  Trauma/Neuro ICU Case Manager [phone]

## 2019-12-20 ENCOUNTER — Inpatient Hospital Stay (HOSPITAL_COMMUNITY): Payer: Medicare Other

## 2019-12-20 DIAGNOSIS — R9431 Abnormal electrocardiogram [ECG] [EKG]: Secondary | ICD-10-CM

## 2019-12-20 LAB — ECHOCARDIOGRAM LIMITED
Area-P 1/2: 4.6 cm2
Calc EF: 61 %
Height: 58.5 in
S' Lateral: 2.6 cm
Single Plane A2C EF: 60.3 %
Single Plane A4C EF: 61.6 %
Weight: 1904.77 oz

## 2019-12-20 LAB — MAGNESIUM: Magnesium: 1.6 mg/dL — ABNORMAL LOW (ref 1.7–2.4)

## 2019-12-20 MED ORDER — MAGNESIUM SULFATE 2 GM/50ML IV SOLN
2.0000 g | Freq: Once | INTRAVENOUS | Status: AC
  Administered 2019-12-20: 2 g via INTRAVENOUS
  Filled 2019-12-20: qty 50

## 2019-12-20 NOTE — Progress Notes (Signed)
Patient noted to have had minimal output during shift.  Bladder scan performed and noted >362ml.  In and out cath completed and returned 600 mls of urine.  Patient tolerated well.

## 2019-12-20 NOTE — Progress Notes (Signed)
  Echocardiogram 2D Echocardiogram has been performed.  [name] 12/20/2019, 1:42 PM
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
## 2019-12-21 LAB — BASIC METABOLIC PANEL
Anion gap: 8 (ref 5–15)
BUN: 24 mg/dL — ABNORMAL HIGH (ref 8–23)
CO2: 29 mmol/L (ref 22–32)
Calcium: 8.4 mg/dL — ABNORMAL LOW (ref 8.9–10.3)
Chloride: 100 mmol/L (ref 98–111)
Creatinine, Ser: 0.64 mg/dL (ref 0.44–1.00)
GFR calc Af Amer: >60 mL/min (ref >60)
GFR calc non Af Amer: >60 mL/min (ref >60)
Glucose, Bld: 127 mg/dL — ABNORMAL HIGH (ref 70–99)
Potassium: 4.6 mmol/L (ref 3.5–5.1)
Sodium: 137 mmol/L (ref 135–145)

## 2019-12-21 LAB — CBC
HCT: 29.1 % — ABNORMAL LOW (ref 36.0–46.0)
Hemoglobin: 9.2 g/dL — ABNORMAL LOW (ref 12.0–15.0)
MCH: 31.2 pg (ref 26.0–34.0)
MCHC: 31.6 g/dL (ref 30.0–36.0)
MCV: 98.6 fL (ref 80.0–100.0)
Platelets: 194 10*3/uL (ref 150–400)
RBC: 2.95 MIL/uL — ABNORMAL LOW (ref 3.87–5.11)
RDW: 13.5 % (ref 11.5–15.5)
WBC: 6.3 10*3/uL (ref 4.0–10.5)
nRBC: 0 % (ref 0.0–0.2)

## 2019-12-21 LAB — MAGNESIUM: Magnesium: 1.8 mg/dL (ref 1.7–2.4)

## 2019-12-21 MED ORDER — CHLORHEXIDINE GLUCONATE CLOTH 2 % EX PADS
6.0000 | MEDICATED_PAD | Freq: Every day | CUTANEOUS | Status: DC
  Administered 2019-12-21 – 2019-12-22 (×2): 6 via TOPICAL

## 2019-12-21 NOTE — TOC Progression Note (Signed)
Transition of Care [facility]) - Progression Note    Patient Details  Name: [name] MRN: [id number] Date of Birth: February 05, 1930  Transition of Care [facility]) CM/SW [place], LCSW Phone Number: 12/21/2019, 11:56 AM  Clinical Narrative:     CSW spoke with Langley Gauss at [facility] about the referral and she stated that [facility] is reaching out to their main office for a decision on Monday.  TOC team will continue to assist with discharge planning needs.  Expected Discharge Plan: [place] Barriers to Discharge: Continued Medical Work up  Expected Discharge Plan and Services Expected Discharge Plan: [place]   Discharge Planning Services: CM Consult Post Acute Care Choice: [place] Living arrangements for the past 2 months: Single Family Home                                       Social Determinants of Health (SDOH) Interventions    Readmission Risk Interventions No flowsheet data found.

## 2019-12-21 NOTE — Progress Notes (Signed)
0525= Pt has no urine output since In and out cath at 5:30 pm yesterday. Has the urge to urinate but unable to do so. Bladder scan shows 563ml. Dr Donne Hazel notified, and ordered to place foley cath. Foley cath inserted aseptically with Nellie Rn. Has 524ml returns.
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
## 2019-12-22 LAB — SARS CORONAVIRUS 2 (TAT 6-24 HRS): SARS Coronavirus 2: NEGATIVE

## 2019-12-22 MED ORDER — ASPIRIN EC 81 MG PO TBEC
81.0000 mg | DELAYED_RELEASE_TABLET | Freq: Every day | ORAL | Status: DC
  Administered 2019-12-22 – 2019-12-23 (×2): 81 mg via ORAL
  Filled 2019-12-22 (×2): qty 1
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
## 2019-12-22 NOTE — Progress Notes (Signed)
Inpatient Rehab Admissions Coordinator:   Spoke with case manager, Ellan Lambert.  Pt wanting to d/c to [facility] instead of CIR and it appears this will be possible.  Will sign off for CIR at this time.   Shann Medal, PT, DPT Admissions Coordinator [phone] 12/22/19  12:00 PM
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
## 2019-12-22 NOTE — TOC Progression Note (Signed)
Transition of Care [facility]) - Progression Note    Patient Details  Name: [name] MRN: [id number] Date of Birth: 1929-09-15  Transition of Care [facility] CM/SW Contact  Ella Bodo, RN Phone Number: 12/22/2019, 4:16 PM  Clinical Narrative: Patient has been accepted for admission to [facility], pending insurance authorization.  Facility unwilling to accept waiver due to possibility of liability issues.  Submitted for insurance auth with [facility] and fax clinical information to [phone].  Reference #[id number].  Covid test ordered today in preparation for likely discharge to facility tomorrow after authorization received.    Expected Discharge Plan: Skilled Nursing Facility Barriers to Discharge: Ship [employer]  Expected Discharge Plan and Services Expected Discharge Plan: [place]   Discharge Planning Services: CM Consult Post Acute Care Choice: [place] Living arrangements for the past 2 months: Single Family Home                                       Social Determinants of Health (SDOH) Interventions    Readmission Risk Interventions No flowsheet data found.  Reinaldo Raddle, RN, BSN  Trauma/Neuro ICU Case Manager [phone]
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
## 2019-12-23 MED ORDER — ACETAMINOPHEN 325 MG PO TABS
325.0000 mg | ORAL_TABLET | Freq: Four times a day (QID) | ORAL | Status: DC | PRN
Start: 2019-12-23 — End: 2020-01-14

## 2019-12-23 MED ORDER — METHOCARBAMOL 500 MG PO TABS: 500.0000 mg | ORAL_TABLET | Freq: Four times a day (QID) | ORAL | 0 refills | Status: DC | PRN

## 2019-12-23 MED ORDER — OXYCODONE HCL 5 MG PO TABS
5.0000 mg | ORAL_TABLET | Freq: Four times a day (QID) | ORAL | 0 refills | Status: DC | PRN
Start: 2019-12-23 — End: 2020-01-14

## 2019-12-23 MED ORDER — POLYETHYLENE GLYCOL 3350 17 G PO PACK: 17.0000 g | PACK | Freq: Every day | ORAL | 0 refills | Status: DC | PRN
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
## 2019-12-23 NOTE — Progress Notes (Signed)
Katryn A Prevattto be D/C'd per MD order. Discussed with the patient and all questions fully answered. ? VSS, Skin clean, dry and intact without evidence of skin break down, no evidence of skin tears noted. ? IV catheter discontinued intact. Site without signs and symptoms of complications. Dressing and pressure applied. ? An After Visit Summary was printed and given to the patient. Patient informed where to pickup prescriptions. ? D/c education completed with SNF nurse including follow up instructions, medication list, d/c activities limitations if indicated, with other d/c instructions as indicated by MD - patient able to verbalize understanding, all questions fully answered.  ? Patient instructed to return to ED, call 911, or call MD for any changes in condition.  ? Patient to be escorted via stretcher, PTAR to skilled [place].

## 2019-12-23 NOTE — TOC Transition Note (Addendum)
Transition of Care [facility]) - CM/SW Discharge Note   Patient Details  Name: [name] MRN: [id number] Date of Birth: 05/30/1929  [facility] CM/SW Contact:  Ella Bodo, RN Phone Number: 12/23/2019, 1:07 PM   Clinical Narrative:  Notified by [place] that insurance authorization received for admission to [facility]; authorization is for three days, starting on 12/23/19.  SNF will need to fax updated clinicals to [phone].   DC summary sent to facility; bedside nurse to call report to [phone].  Pt going to room 108.    PTAR called for transport at 1315.       Final next level of care: Skilled Nursing Facility Barriers to Discharge: Barriers Resolved   Patient Goals and CMS Choice Patient states their goals for this hospitalization and ongoing recovery are:: to get stronger CMS Medicare.gov Compare Post Acute Care list provided to:: Patient Choice offered to / list presented to : Patient  Discharge Placement   Existing PASRR number confirmed : 12/16/19          Patient chooses bed at: [facility] Patient to be transferred to facility by: [place] Name of family member notified: [name] Mcmanaway Patient and family notified of of transfer: 12/23/19   **attempted to reach pt's son by phone with no answer and mailbox was full.    Discharge Plan and Services   Discharge Planning Services: CM Consult Post Acute Care Choice: Skilled Nursing Facility                               Social Determinants of Health (SDOH) Interventions     Readmission Risk Interventions Readmission Risk Prevention Plan 12/23/2019  Transportation Screening Complete  PCP or Specialist Appt within 3-5 Days Not Complete  Not Complete comments DC to SNF  [place] or [place] Not Complete  HRI or Home Care Consult comments Pt dc to SNF  Social Work Consult for [place] Planning/Counseling Not Complete  SW consult not  completed comments DC to SNF  Palliative Care Screening Not Applicable  Medication Review (RN Care Manager) Complete  Some recent data might be hidden   Reinaldo Raddle, RN, BSN  Trauma/Neuro ICU Case Manager [phone]
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
## 2019-12-26 ENCOUNTER — Non-Acute Institutional Stay (SKILLED_NURSING_FACILITY): Payer: Medicare Other | Admitting: Internal Medicine

## 2019-12-26 ENCOUNTER — Encounter: Payer: Self-pay | Admitting: Internal Medicine

## 2019-12-26 DIAGNOSIS — Z8781 Personal history of (healed) traumatic fracture: Secondary | ICD-10-CM

## 2019-12-26 DIAGNOSIS — Z66 Do not resuscitate: Secondary | ICD-10-CM

## 2019-12-26 DIAGNOSIS — Z9889 Other specified postprocedural states: Secondary | ICD-10-CM | POA: Diagnosis not present

## 2019-12-26 DIAGNOSIS — S2221XA Fracture of manubrium, initial encounter for closed fracture: Secondary | ICD-10-CM

## 2019-12-26 DIAGNOSIS — S22029A Unspecified fracture of second thoracic vertebra, initial encounter for closed fracture: Secondary | ICD-10-CM

## 2019-12-26 DIAGNOSIS — S72401A Unspecified fracture of lower end of right femur, initial encounter for closed fracture: Secondary | ICD-10-CM

## 2019-12-26 DIAGNOSIS — D62 Acute posthemorrhagic anemia: Secondary | ICD-10-CM

## 2019-12-26 DIAGNOSIS — M810 Age-related osteoporosis without current pathological fracture: Secondary | ICD-10-CM

## 2019-12-26 DIAGNOSIS — S22019A Unspecified fracture of first thoracic vertebra, initial encounter for closed fracture: Secondary | ICD-10-CM | POA: Diagnosis not present

## 2019-12-26 DIAGNOSIS — S2243XA Multiple fractures of ribs, bilateral, initial encounter for closed fracture: Secondary | ICD-10-CM

## 2019-12-26 DIAGNOSIS — S36039A Unspecified laceration of spleen, initial encounter: Secondary | ICD-10-CM
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
## 2020-01-14 ENCOUNTER — Non-Acute Institutional Stay (SKILLED_NURSING_FACILITY): Payer: Medicare Other | Admitting: Adult Health

## 2020-01-14 ENCOUNTER — Encounter: Payer: Self-pay | Admitting: Adult Health

## 2020-01-14 DIAGNOSIS — I2510 Atherosclerotic heart disease of native coronary artery without angina pectoris: Secondary | ICD-10-CM | POA: Diagnosis not present

## 2020-01-14 DIAGNOSIS — I2584 Coronary atherosclerosis due to calcified coronary lesion: Secondary | ICD-10-CM

## 2020-01-14 DIAGNOSIS — S2243XS Multiple fractures of ribs, bilateral, sequela: Secondary | ICD-10-CM

## 2020-01-14 DIAGNOSIS — N3281 Overactive bladder: Secondary | ICD-10-CM

## 2020-01-14 DIAGNOSIS — S72401S Unspecified fracture of lower end of right femur, sequela: Secondary | ICD-10-CM | POA: Diagnosis not present

## 2020-01-14 DIAGNOSIS — I10 Essential (primary) hypertension: Secondary | ICD-10-CM

## 2020-01-14 DIAGNOSIS — S22009S Unspecified fracture of unspecified thoracic vertebra, sequela: Secondary | ICD-10-CM | POA: Diagnosis not present

## 2020-01-14 DIAGNOSIS — G40909 Epilepsy, unspecified, not intractable, without status epilepticus: Secondary | ICD-10-CM
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
## 2020-01-15 MED ORDER — LEVETIRACETAM 500 MG PO TABS: 500.0000 mg | ORAL_TABLET | Freq: Two times a day (BID) | ORAL | 0 refills | Status: AC

## 2020-01-15 MED ORDER — EZETIMIBE 10 MG PO TABS: 10.0000 mg | ORAL_TABLET | Freq: Every day | ORAL | 0 refills | Status: DC

## 2020-01-15 MED ORDER — OLMESARTAN MEDOXOMIL 20 MG PO TABS: 20.0000 mg | ORAL_TABLET | Freq: Every day | ORAL | 0 refills | Status: DC

## 2020-01-15 MED ORDER — DIPHENOXYLATE-ATROPINE 2.5-0.025 MG PO TABS: 1.0000 | ORAL_TABLET | Freq: Four times a day (QID) | ORAL | 0 refills | Status: DC | PRN

## 2020-01-15 MED ORDER — OXYBUTYNIN CHLORIDE 5 MG PO TABS: 5.0000 mg | ORAL_TABLET | Freq: Every day | ORAL | 0 refills | Status: DC

## 2020-01-15 MED ORDER — NITROGLYCERIN 0.4 MG SL SUBL: 0.4000 mg | SUBLINGUAL_TABLET | SUBLINGUAL | 0 refills | Status: DC | PRN

## 2020-01-15 MED ORDER — AMLODIPINE BESYLATE 10 MG PO TABS: 10.0000 mg | ORAL_TABLET | Freq: Every day | ORAL | 0 refills | Status: DC

## 2020-02-12 ENCOUNTER — Ambulatory Visit: Payer: Medicare Other | Admitting: Internal Medicine

## 2020-05-04 ENCOUNTER — Ambulatory Visit: Payer: Self-pay | Admitting: Student

## 2020-05-04 DIAGNOSIS — S72443D Displaced fracture of lower epiphysis (separation) of unspecified femur, subsequent encounter for closed fracture with routine healing: Secondary | ICD-10-CM | POA: Insufficient documentation

## 2020-05-04 DIAGNOSIS — S72441D Displaced fracture of lower epiphysis (separation) of right femur, subsequent encounter for closed fracture with routine healing: Secondary | ICD-10-CM

## 2020-05-12 ENCOUNTER — Other Ambulatory Visit: Payer: Self-pay

## 2020-05-12 ENCOUNTER — Other Ambulatory Visit (HOSPITAL_COMMUNITY)
Admission: RE | Admit: 2020-05-12 | Discharge: 2020-05-12 | Disposition: A | Discharge status: Home / Self Care | Payer: Medicare Other | Source: Ambulatory Visit | Attending: Student | Admitting: Student

## 2020-05-12 ENCOUNTER — Encounter (HOSPITAL_COMMUNITY): Payer: Self-pay | Admitting: Student

## 2020-05-12 DIAGNOSIS — Z01812 Encounter for preprocedural laboratory examination: Secondary | ICD-10-CM | POA: Insufficient documentation

## 2020-05-12 DIAGNOSIS — Z20822 Contact with and (suspected) exposure to covid-19: Secondary | ICD-10-CM | POA: Insufficient documentation

## 2020-05-12 LAB — SARS CORONAVIRUS 2 (TAT 6-24 HRS): SARS Coronavirus 2: NEGATIVE

## 2020-05-12 NOTE — Progress Notes (Signed)
PCP - Dr Bernerd Limbo Cardiologist - Dr Virgina Jock Pulmonology - Dr Annamaria Boots  Chest x-ray - 12/14/19 (1V) EKG - 12/14/19 Stress Test - 1990 ECHO Limited - 12/20/19 Cardiac Cath - 02/09/00  Sleep Study -  Yes, Mild CPAP - uses adaptive servo ventilation machine at night  Anesthesia review: Yes  STOP now taking any Aspirin (unless otherwise instructed by your surgeon), Aleve, Naproxen, Ibuprofen, Motrin, Advil, Goody's, BC's, all herbal medications, fish oil, and all vitamins.   Coronavirus Screening Covid test is scheduled on 05/12/20 Do you have any of the following symptoms:  Cough yes/no: No Fever (>100.79F)  yes/no: No Runny nose yes/no: No Sore throat yes/no: No Difficulty breathing/shortness of breath  yes/no: No  Have you traveled in the last 14 days and where? yes/no: No  Patient verbalized understanding of instructions that were given via phone.
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
## 2020-05-13 NOTE — H&P (Signed)
Orthopaedic Trauma Service (OTS) H&P  Patient ID: [name] MRN: [id number] DOB/AGE: 85/20/1931 85 y.o.  Reason for Surgery: Hardware removal right distal femur  HPI: [name] is an 87 y.o. female presenting for hardware removal.  Patient underwent ORIF of right distal femur in August 2021.  She has gone on to heal her fracture but continues to have significant pain in the right knee as well as notes a painful clicking sensation with any motion of the knee.  She is still unable to walk any significant distances despite being 5 months out from surgery.  Patient has significant arthritis of the right knee and is seeing Dr. Gladstone Lighter with [facility] in the past.  She presents now for removal of orthopedic hardware as step 1 of a stage procedure for possible total knee arthroplasty in the future.  Past Medical History:  Diagnosis Date   Abdominal pain, other specified site    Allergic rhinitis    Arthritis    CAD (coronary artery disease) 12/2017   s/p stent   Chronic kidney disease    stage 3 kidney disease per patient on 9/48/54   Diastolic CHF ([place]) 62/7035   grade 1   Essential hypertension, benign 06/17/2018   History of non-ST elevation myocardial infarction (NSTEMI) 06/17/2018   HLD (hyperlipidemia)    Hypertension    Irregular heart rate    Ischemic cardiomyopathy 06/17/2018   Myocardial infarction ([place]) 12/2017    [facility]   OSA (obstructive sleep apnea)    uses adaptive servo ventilation machine at night   Seizure disorder ([place])    last seizure in the 1950s-well controlled with keppra   Skin cancer    left arm    Past Surgical History:  Procedure Laterality Date   BACK SURGERY     CARDIAC CATHETERIZATION  02/09/2000   CHOLECYSTECTOMY     EYE SURGERY Bilateral    cataracts removed   KNEE SURGERY     laparoscopic   ORIF FEMUR FRACTURE Right 12/15/2019   Procedure: OPEN REDUCTION INTERNAL FIXATION (ORIF) DISTAL FEMUR FRACTURE;  Surgeon: Shona Needles, MD;  Location: [place];  Service: Orthopedics;  Laterality: Right;   TOTAL ABDOMINAL HYSTERECTOMY     WRIST FRACTURE SURGERY Bilateral    x 2    Family History  Problem Relation Age of Onset   Heart disease Father    Skin cancer Father    Skin cancer Mother    Heart attack Mother    Stroke Sister    Congenital heart disease Niece    Renal Disease Niece     Social History:  reports that she has never smoked. She has never used smokeless tobacco. She reports that she does not drink alcohol and does not use drugs.  Allergies:  Allergies  Allergen Reactions   Codeine Other (See Comments) and Hypertension    Causes a sensation of "pressure" within her head   Latex Itching   Tape Itching    Medications: I have reviewed the patient's current medications. Prior to Admission:  No medications prior to admission.    ROS: Constitutional: No fever or chills Vision: No changes in vision ENT: No difficulty swallowing CV: No chest pain Pulm: No SOB or wheezing GI: No nausea or vomiting GU: No urgency or inability to hold urine Skin: No poor wound healing Neurologic: No numbness or tingling Psychiatric: No depression or anxiety Heme: No bruising Allergic: No reaction to medications or food   Exam: Height  4\' 10"  (1.473 m), weight 52.2 kg. General: No acute distress Orientation: Alert and oriented x4 Mood and Affect: Mood and affect appropriate.  Pleasant and cooperative Gait: Ambulates with antalgic gait with use of walker Coordination and balance: Within normal limits  RLE: Healed surgical scar over anterior lateral knee.  Global tenderness with palpation about the knee.  Is able to flex and extend the knee but endorses pain with these motions.  Patient with a palpable grinding/clicking particularly with flexion of the knee.  Full painless ankle motion.  Motor and sensory function intact distally.  She is neurovascularly intact   LLE: Skin without lesions. No tenderness  to palpation. Full painless ROM, full strength in each muscle group without evidence of instability.  Motor and sensory function intact.  Neurovascularly intact   Medical Decision Making: Data: Imaging: AP and lateral views of the right knee shows plate and screws in excellent position. Fracture in appropriate alignment with notable callus. End stage osteoarthritis in both lateral and medial compartments.   Labs:  Results for orders placed or performed during the hospital encounter of 05/12/20 (from the past 168 hour(s))  SARS CORONAVIRUS 2 (TAT 6-24 HRS) Nasopharyngeal Nasopharyngeal Swab   Collection Time: 05/12/20  1:09 PM   Specimen: Nasopharyngeal Swab  Result Value Ref Range   SARS Coronavirus 2 NEGATIVE NEGATIVE    Assessment/Plan: 87 year old female s/p ORIF right distal femur fracture 12/15/2019  Patient's fracture appears to be fully healed on radiographs at this point.  She continues to have significant pain in the right knee particularly when ambulating and with any motion of the knee.  I feel this is directly related to to the significant tricompartmental osteoarthritis in the joint.  I feel it is most appropriate to proceed with hardware removal as part of a staged knee arthroplasty.  Risk and benefits of the procedure discussed with patient and her family.  She agrees to proceed with surgery.  Consent will be obtained.   Ayad Nieman A. Carmie Kanner [facility] [phone] (office) [email or web address]

## 2020-05-14 ENCOUNTER — Encounter (HOSPITAL_COMMUNITY)
Admission: RE | Disposition: A | Discharge status: Home / Self Care | Payer: Self-pay | Source: Home / Self Care | Attending: Student

## 2020-05-14 ENCOUNTER — Ambulatory Visit (HOSPITAL_COMMUNITY): Payer: Medicare Other | Admitting: Physician Assistant

## 2020-05-14 ENCOUNTER — Ambulatory Visit (HOSPITAL_COMMUNITY): Payer: Medicare Other

## 2020-05-14 ENCOUNTER — Other Ambulatory Visit: Payer: Self-pay

## 2020-05-14 ENCOUNTER — Ambulatory Visit (HOSPITAL_COMMUNITY)
Admission: RE | Admit: 2020-05-14 | Discharge: 2020-05-14 | Disposition: A | Discharge status: Home / Self Care | Payer: Medicare Other | Attending: Student | Admitting: Student

## 2020-05-14 ENCOUNTER — Encounter (HOSPITAL_COMMUNITY): Payer: Self-pay | Admitting: Student

## 2020-05-14 DIAGNOSIS — M1731 Unilateral post-traumatic osteoarthritis, right knee: Secondary | ICD-10-CM | POA: Diagnosis present

## 2020-05-14 DIAGNOSIS — Z8781 Personal history of (healed) traumatic fracture: Secondary | ICD-10-CM | POA: Insufficient documentation

## 2020-05-14 DIAGNOSIS — G40909 Epilepsy, unspecified, not intractable, without status epilepticus: Secondary | ICD-10-CM | POA: Insufficient documentation

## 2020-05-14 DIAGNOSIS — Z85828 Personal history of other malignant neoplasm of skin: Secondary | ICD-10-CM | POA: Insufficient documentation

## 2020-05-14 DIAGNOSIS — S72443D Displaced fracture of lower epiphysis (separation) of unspecified femur, subsequent encounter for closed fracture with routine healing: Secondary | ICD-10-CM

## 2020-05-14 DIAGNOSIS — I252 Old myocardial infarction: Secondary | ICD-10-CM | POA: Insufficient documentation

## 2020-05-14 DIAGNOSIS — Z955 Presence of coronary angioplasty implant and graft: Secondary | ICD-10-CM | POA: Insufficient documentation

## 2020-05-14 DIAGNOSIS — G4731 Primary central sleep apnea: Secondary | ICD-10-CM | POA: Insufficient documentation

## 2020-05-14 DIAGNOSIS — I2510 Atherosclerotic heart disease of native coronary artery without angina pectoris: Secondary | ICD-10-CM | POA: Insufficient documentation

## 2020-05-14 DIAGNOSIS — T1490XA Injury, unspecified, initial encounter: Secondary | ICD-10-CM

## 2020-05-14 DIAGNOSIS — S72441D Displaced fracture of lower epiphysis (separation) of right femur, subsequent encounter for closed fracture with routine healing: Secondary | ICD-10-CM

## 2020-05-14 HISTORY — DX: Unspecified osteoarthritis, unspecified site: M19.90

## 2020-05-14 HISTORY — DX: Hyperlipidemia, unspecified: E78.5

## 2020-05-14 HISTORY — PX: HARDWARE REMOVAL: SHX979

## 2020-05-14 HISTORY — DX: Chronic kidney disease, unspecified: N18.9

## 2020-05-14 LAB — CBC
HCT: 34.5 % — ABNORMAL LOW (ref 36.0–46.0)
Hemoglobin: 11.1 g/dL — ABNORMAL LOW (ref 12.0–15.0)
MCH: 29.8 pg (ref 26.0–34.0)
MCHC: 32.2 g/dL (ref 30.0–36.0)
MCV: 92.7 fL (ref 80.0–100.0)
Platelets: 266 10*3/uL (ref 150–400)
RBC: 3.72 MIL/uL — ABNORMAL LOW (ref 3.87–5.11)
RDW: 13.2 % (ref 11.5–15.5)
WBC: 6 10*3/uL (ref 4.0–10.5)
nRBC: 0 % (ref 0.0–0.2)

## 2020-05-14 LAB — BASIC METABOLIC PANEL
Anion gap: 11 (ref 5–15)
BUN: 18 mg/dL (ref 8–23)
CO2: 24 mmol/L (ref 22–32)
Calcium: 9.3 mg/dL (ref 8.9–10.3)
Chloride: 102 mmol/L (ref 98–111)
Creatinine, Ser: 0.86 mg/dL (ref 0.44–1.00)
GFR, Estimated: >60 mL/min (ref >60)
Glucose, Bld: 104 mg/dL — ABNORMAL HIGH (ref 70–99)
Potassium: 3.7 mmol/L (ref 3.5–5.1)
Sodium: 137 mmol/L (ref 135–145)

## 2020-05-14 SURGERY — REMOVAL, HARDWARE
Anesthesia: General | Site: Leg Upper | Laterality: Right

## 2020-05-14 MED ORDER — BUPIVACAINE HCL 0.5 % IJ SOLN
INTRAMUSCULAR | Status: DC | PRN
Start: 2020-05-14 — End: 2020-05-14
  Administered 2020-05-14: 30 mL

## 2020-05-14 MED ORDER — DEXAMETHASONE SODIUM PHOSPHATE 10 MG/ML IJ SOLN
INTRAMUSCULAR | Status: AC
  Filled 2020-05-14: qty 1

## 2020-05-14 MED ORDER — ONDANSETRON HCL 4 MG/2ML IJ SOLN: 4.0000 mg | Freq: Once | INTRAMUSCULAR | Status: DC | PRN

## 2020-05-14 MED ORDER — DEXAMETHASONE SODIUM PHOSPHATE 10 MG/ML IJ SOLN
INTRAMUSCULAR | Status: DC | PRN
  Administered 2020-05-14: 10 mg via INTRAVENOUS

## 2020-05-14 MED ORDER — ROCURONIUM BROMIDE 10 MG/ML (PF) SYRINGE
PREFILLED_SYRINGE | INTRAVENOUS | Status: DC | PRN
  Administered 2020-05-14: 50 mg via INTRAVENOUS

## 2020-05-14 MED ORDER — VANCOMYCIN HCL 1000 MG IV SOLR
INTRAVENOUS | Status: AC
  Filled 2020-05-14: qty 1000

## 2020-05-14 MED ORDER — ORAL CARE MOUTH RINSE: 15.0000 mL | Freq: Once | OROMUCOSAL | Status: AC

## 2020-05-14 MED ORDER — LIDOCAINE 2% (20 MG/ML) 5 ML SYRINGE
INTRAMUSCULAR | Status: AC
  Filled 2020-05-14: qty 5

## 2020-05-14 MED ORDER — CHLORHEXIDINE GLUCONATE 0.12 % MT SOLN
15.0000 mL | Freq: Once | OROMUCOSAL | Status: AC
  Administered 2020-05-14: 15 mL via OROMUCOSAL
  Filled 2020-05-14: qty 15

## 2020-05-14 MED ORDER — FENTANYL CITRATE (PF) 100 MCG/2ML IJ SOLN
INTRAMUSCULAR | Status: AC
  Filled 2020-05-14: qty 2

## 2020-05-14 MED ORDER — PROPOFOL 10 MG/ML IV BOLUS
INTRAVENOUS | Status: DC | PRN
  Administered 2020-05-14: 70 mg via INTRAVENOUS

## 2020-05-14 MED ORDER — 0.9 % SODIUM CHLORIDE (POUR BTL) OPTIME
TOPICAL | Status: DC | PRN
  Administered 2020-05-14: 1000 mL

## 2020-05-14 MED ORDER — PHENYLEPHRINE 40 MCG/ML (10ML) SYRINGE FOR IV PUSH (FOR BLOOD PRESSURE SUPPORT)
PREFILLED_SYRINGE | INTRAVENOUS | Status: DC | PRN
  Administered 2020-05-14: 80 ug via INTRAVENOUS

## 2020-05-14 MED ORDER — FENTANYL CITRATE (PF) 250 MCG/5ML IJ SOLN
INTRAMUSCULAR | Status: DC | PRN
  Administered 2020-05-14 (×3): 50 ug via INTRAVENOUS

## 2020-05-14 MED ORDER — CEFAZOLIN SODIUM-DEXTROSE 2-4 GM/100ML-% IV SOLN
2.0000 g | INTRAVENOUS | Status: AC
  Administered 2020-05-14: 2 g via INTRAVENOUS
  Filled 2020-05-14: qty 100

## 2020-05-14 MED ORDER — OXYCODONE HCL 5 MG PO TABS
ORAL_TABLET | ORAL | Status: AC
  Filled 2020-05-14: qty 1

## 2020-05-14 MED ORDER — LIDOCAINE 2% (20 MG/ML) 5 ML SYRINGE
INTRAMUSCULAR | Status: DC | PRN
  Administered 2020-05-14: 40 mg via INTRAVENOUS

## 2020-05-14 MED ORDER — VANCOMYCIN HCL 1000 MG IV SOLR
INTRAVENOUS | Status: DC | PRN
  Administered 2020-05-14: 1000 mg

## 2020-05-14 MED ORDER — OXYCODONE HCL 5 MG/5ML PO SOLN: 5.0000 mg | Freq: Once | ORAL | Status: AC | PRN

## 2020-05-14 MED ORDER — PROPOFOL 10 MG/ML IV BOLUS
INTRAVENOUS | Status: AC
  Filled 2020-05-14: qty 20

## 2020-05-14 MED ORDER — BUPIVACAINE HCL (PF) 0.5 % IJ SOLN
INTRAMUSCULAR | Status: AC
  Filled 2020-05-14: qty 30

## 2020-05-14 MED ORDER — AMISULPRIDE (ANTIEMETIC) 5 MG/2ML IV SOLN: 10.0000 mg | Freq: Once | INTRAVENOUS | Status: DC | PRN

## 2020-05-14 MED ORDER — FENTANYL CITRATE (PF) 100 MCG/2ML IJ SOLN
25.0000 ug | INTRAMUSCULAR | Status: DC | PRN
  Administered 2020-05-14 (×2): 50 ug via INTRAVENOUS

## 2020-05-14 MED ORDER — OXYCODONE HCL 5 MG PO TABS
5.0000 mg | ORAL_TABLET | Freq: Once | ORAL | Status: AC | PRN
  Administered 2020-05-14: 5 mg via ORAL

## 2020-05-14 MED ORDER — ONDANSETRON HCL 4 MG/2ML IJ SOLN
INTRAMUSCULAR | Status: DC | PRN
  Administered 2020-05-14: 4 mg via INTRAVENOUS

## 2020-05-14 MED ORDER — SUGAMMADEX SODIUM 200 MG/2ML IV SOLN
INTRAVENOUS | Status: DC | PRN
  Administered 2020-05-14: 200 mg via INTRAVENOUS

## 2020-05-14 MED ORDER — LACTATED RINGERS IV SOLN: INTRAVENOUS | Status: DC

## 2020-05-14 MED ORDER — TRAMADOL HCL 50 MG PO TABS: 50.0000 mg | ORAL_TABLET | Freq: Four times a day (QID) | ORAL | 0 refills | Status: DC | PRN

## 2020-05-14 MED ORDER — ROCURONIUM BROMIDE 10 MG/ML (PF) SYRINGE
PREFILLED_SYRINGE | INTRAVENOUS | Status: AC
  Filled 2020-05-14: qty 10

## 2020-05-14 MED ORDER — FENTANYL CITRATE (PF) 250 MCG/5ML IJ SOLN
INTRAMUSCULAR | Status: AC
  Filled 2020-05-14: qty 5

## 2020-05-14 SURGICAL SUPPLY — 49 items
BNDG ELASTIC 4X5.8 VLCR STR LF (GAUZE/BANDAGES/DRESSINGS) ×2 IMPLANT
BNDG ELASTIC 6X5.8 VLCR STR LF (GAUZE/BANDAGES/DRESSINGS) ×2 IMPLANT
BRUSH SCRUB EZ PLAIN DRY (MISCELLANEOUS) ×2 IMPLANT
BUR DIAMOND COARSE 3.0 (BURR) ×2 IMPLANT
BUR DISC 0.8X25 (BURR) ×2 IMPLANT
CHLORAPREP W/TINT 26 (MISCELLANEOUS) ×4 IMPLANT
COVER SURGICAL LIGHT HANDLE (MISCELLANEOUS) ×2 IMPLANT
COVER WAND RF STERILE (DRAPES) ×2 IMPLANT
DERMABOND ADHESIVE PROPEN (GAUZE/BANDAGES/DRESSINGS) ×1
DERMABOND ADVANCED (GAUZE/BANDAGES/DRESSINGS) ×1
DERMABOND ADVANCED .7 DNX12 (GAUZE/BANDAGES/DRESSINGS) ×1 IMPLANT
DERMABOND ADVANCED .7 DNX6 (GAUZE/BANDAGES/DRESSINGS) ×1 IMPLANT
DRAPE C-ARM 42X72 X-RAY (DRAPES) ×2 IMPLANT
DRAPE C-ARMOR (DRAPES) ×2 IMPLANT
DRAPE U-SHAPE 47X51 STRL (DRAPES) ×2 IMPLANT
DRSG MEPILEX BORDER 4X8 (GAUZE/BANDAGES/DRESSINGS) ×4 IMPLANT
ELECT REM PT RETURN 9FT ADLT (ELECTROSURGICAL) ×2
ELECTRODE REM PT RTRN 9FT ADLT (ELECTROSURGICAL) ×1 IMPLANT
GAUZE SPONGE 4X4 12PLY STRL (GAUZE/BANDAGES/DRESSINGS) ×2 IMPLANT
GLOVE BIO SURGEON STRL SZ 6.5 (GLOVE) ×6 IMPLANT
GLOVE BIO SURGEON STRL SZ7.5 (GLOVE) ×4 IMPLANT
GLOVE BIOGEL PI IND STRL 7.5 (GLOVE) ×1 IMPLANT
GLOVE BIOGEL PI INDICATOR 7.5 (GLOVE) ×1
GLOVE SURG UNDER POLY LF SZ6.5 (GLOVE) ×2 IMPLANT
GOWN STRL REUS W/ TWL LRG LVL3 (GOWN DISPOSABLE) ×2 IMPLANT
GOWN STRL REUS W/TWL LRG LVL3 (GOWN DISPOSABLE) ×4
KIT BASIN OR (CUSTOM PROCEDURE TRAY) ×2 IMPLANT
KIT TURNOVER KIT B (KITS) ×2 IMPLANT
MANIFOLD NEPTUNE II (INSTRUMENTS) ×2 IMPLANT
NEEDLE HYPO 22GX1.5 SAFETY (NEEDLE) ×2 IMPLANT
NS IRRIG 1000ML POUR BTL (IV SOLUTION) ×2 IMPLANT
PACK ORTHO EXTREMITY (CUSTOM PROCEDURE TRAY) ×2 IMPLANT
PAD ARMBOARD 7.5X6 YLW CONV (MISCELLANEOUS) ×4 IMPLANT
PADDING CAST SYN 6 (CAST SUPPLIES) ×1
PADDING CAST SYNTHETIC 4 (CAST SUPPLIES) ×1
PADDING CAST SYNTHETIC 4X4 STR (CAST SUPPLIES) ×1 IMPLANT
PADDING CAST SYNTHETIC 6X4 NS (CAST SUPPLIES) ×1 IMPLANT
SUCTION FRAZIER HANDLE 10FR (MISCELLANEOUS) ×2
SUCTION TUBE FRAZIER 10FR DISP (MISCELLANEOUS) ×1 IMPLANT
SUT MON AB 2-0 CT1 36 (SUTURE) ×2 IMPLANT
SUT VIC AB 0 CT1 27 (SUTURE) ×2
SUT VIC AB 0 CT1 27XBRD ANBCTR (SUTURE) ×1 IMPLANT
SUT VIC AB 2-0 CT1 27 (SUTURE) ×2
SUT VIC AB 2-0 CT1 TAPERPNT 27 (SUTURE) ×1 IMPLANT
SYR CONTROL 10ML LL (SYRINGE) ×2 IMPLANT
TOWEL GREEN STERILE (TOWEL DISPOSABLE) ×2 IMPLANT
TUBE CONNECTING 12X1/4 (SUCTIONS) ×2 IMPLANT
WATER STERILE IRR 1000ML POUR (IV SOLUTION) ×2 IMPLANT
YANKAUER SUCT BULB TIP NO VENT (SUCTIONS) ×2 IMPLANT

## 2020-05-14 NOTE — Interval H&P Note (Signed)
History and Physical Interval Note:  05/14/2020 7:11 AM  [name]  has presented today for surgery, with the diagnosis of PAinful orthopaedic hardware.  The various methods of treatment have been discussed with the patient and family. After consideration of risks, benefits and other options for treatment, the patient has consented to  Procedure(s): HARDWARE REMOVAL RIGHT FEMUR (Right) as a surgical intervention.  The patient's history has been reviewed, patient examined, no change in status, stable for surgery.  I have reviewed the patient's chart and labs.  Questions were answered to the patient's satisfaction.     Lennette Bihari P Sandor Arboleda

## 2020-05-14 NOTE — Anesthesia Procedure Notes (Signed)
Procedure Name: Intubation Date/Time: 05/14/2020 7:45 AM Performed by: Myna Bright, CRNA Pre-anesthesia Checklist: Patient identified, Emergency Drugs available, Suction available and Patient being monitored Patient Re-evaluated:Patient Re-evaluated prior to induction Oxygen Delivery Method: Circle system utilized Preoxygenation: Pre-oxygenation with 100% oxygen Induction Type: IV induction Ventilation: Mask ventilation without difficulty Laryngoscope Size: Mac and 3 Grade View: Grade I Tube type: Oral Tube size: 7.0 mm Number of attempts: 1 Airway Equipment and Method: Stylet Placement Confirmation: ETT inserted through vocal cords under direct vision,  positive ETCO2 and breath sounds checked- equal and bilateral Secured at: 20 cm Tube secured with: Tape Dental Injury: Teeth and Oropharynx as per pre-operative assessment

## 2020-05-14 NOTE — Op Note (Signed)
Orthopaedic Surgery Operative Note (CSN: [id number] ) Date of Surgery: 05/14/2020  Admit Date: 05/14/2020   Diagnoses: Pre-Op Diagnoses: Healed right supracondylar femur fracture s/p ORIF End-stage post-traumatic arthritis  Post-Op Diagnosis: Same  Procedures: CPT 26080-Removal of hardware right femur  Surgeons : Primary: Shona Needles, MD  Assistant: Patrecia Pace, PA-C  Location: OR 3   Anesthesia:General  Antibiotics: Ancef 2g preop with 1 gm vancomycin powder placed topically   Tourniquet time: None  Estimated Blood WUJW:119 mL  Complications:None   Specimens:None   Implants: Implant Name Type Inv. Item Serial No. Manufacturer Lot No. LRB No. Used Action  SCREW NCB 4.0MX34M - [id number] Screw SCREW NCB 4.0MX34M  [employer](ORTH,TRAU,BIO,SG)  Right 1 Explanted  PLATE FEM DIST NCB PP 278MM - [id number] Plate PLATE FEM DIST NCB PP 278MM  [employer](ORTH,TRAU,BIO,SG)  Right 1 Explanted  CAP LOCK NCB - [id number] Cap CAP LOCK NCB  [employer](ORTH,TRAU,BIO,SG)  Right 7 Explanted  SCREW 5.0 80MM - [id number] Screw SCREW 5.0 80MM  [employer](ORTH,TRAU,BIO,SG)  Right 4 Explanted  SCREW NCB 5.0X36MM - [id number] Screw SCREW NCB 5.0X36MM  [employer](ORTH,TRAU,BIO,SG)  Right 2 Explanted  SCREW NCB 5.0X85MM - [id number] Screw SCREW NCB 5.0X85MM  [employer](ORTH,TRAU,BIO,SG)  Right 1 Explanted     Indications for Surgery: 87 year old female who had a right distal femur fracture in August 2021.  She underwent open reduction internal fixation.  She had significant end-stage knee arthritis and she continued to have significant pain and limitations in function.  After she healed uneventfully have recommended proceeding hardware removal in a staged fashion to pursue a total knee arthroplasty.  I discussed risks and benefits of hardware removal.  Risks included but not limited to bleeding, infection, refracture, nerve and blood vessel injury, wound healing problems, DVT, even the  possibility of anesthetic complications.  The patient agreed to proceed with surgery and consent was obtained.  Operative Findings: Successful removal of right distal femur hardware without complication  Procedure: The patient was identified in the preoperative holding area. Consent was confirmed with the patient and their family and all questions were answered. The operative extremity was marked after confirmation with the patient. she was then brought back to the operating room by our anesthesia colleagues.  She was carefully transferred over to a radiolucent flat top table.  She was placed under general anesthetic.  A bump was placed under her operative hip.  The right lower extremity was then prepped and draped in usual sterile fashion.  A timeout was performed to verify the patient, the procedure, and the extremity.  Preoperative antibiotics were dosed.  Fluoroscopic imaging was obtained to show the hardware that was in place.  I reopened the previous incisions and carried it down through skin and subcutaneous tissue.  I incised through the IT band to expose the distal portion of the plate.  I removed the locking caps from the distal plate as well as 4 out of 5 distal screws.  The 1 screw was caught in the plate and I was not able to remove it.  I then turned my attention to the proximal portion of the plate and remove the locking caps and all 3 of the proximal screws.  At this point I attempted multiple things to get that screw disengaged from the plate but unfortunately was unable to.  As a result I then took a metal bur and burred through the shaft of the screw.  This was after I was able to bring the plate  and screw head approximately 2 cm away from the bone.  Once I was able to cut through the screw the plate was removed after release of some of the scar tissue in the plate holes.  The shaft of the screw was removed without difficulty.  Final fluoroscopic imaging was obtained.  The incisions were  copiously irrigated.  A gram of vancomycin powder was placed into the incision.  Layered closure 0 Vicryl, 2-0 Vicryl and 3-0 Monocryl with Dermabond was used to close the skin.  Local anesthetic was injected sterilely into the field after closure with a 3-0 Monocryl.  Sterile dressing was placed.  The patient was then awoken from anesthesia and taken to the PACU in stable condition.   Post Op Plan/Instructions: Patient will be weightbearing as tolerated to the right lower extremity.  No DVT prophylaxis is needed in this ambulatory patient.  We will see her back in approximately 2 weeks for x-rays and wound check.  We will likely set up a referral for total knee arthroplasty at that time.  I was present and performed the entire surgery.  Patrecia Pace, PA-C did assist me throughout the case. An assistant was necessary given the difficulty in approach, maintenance of reduction and ability to instrument the fracture.   Katha Hamming, MD [facility]

## 2020-05-14 NOTE — Anesthesia Postprocedure Evaluation (Signed)
Anesthesia Post Note  Patient: [name]  Procedure(s) Performed: HARDWARE REMOVAL RIGHT FEMUR (Right Leg Upper)     Patient location during evaluation: PACU Anesthesia Type: General Level of consciousness: awake and alert Pain management: pain level controlled Vital Signs Assessment: post-procedure vital signs reviewed and stable Respiratory status: spontaneous breathing, nonlabored ventilation and respiratory function stable Cardiovascular status: blood pressure returned to baseline and stable Postop Assessment: no apparent nausea or vomiting Anesthetic complications: no   No complications documented.  Last Vitals:  Vitals:   05/14/20 0555 05/14/20 0933  BP: (!) 146/49 (!) 156/67  Pulse: (!) 108 89  Resp: 18 14  Temp: 36.4 C (!) 36.2 C  SpO2: 100% 100%    Last Pain:  Vitals:   05/14/20 0605  TempSrc:   PainSc: 0-No pain                 Lidia Collum

## 2020-05-14 NOTE — Discharge Instructions (Signed)
Orthopaedic Trauma Service Discharge Instructions   General Discharge Instructions  WEIGHT BEARING STATUS: Weightbearing as tolerated right lower extremity  RANGE OF MOTION/ACTIVITY: Ok for unrestricted range of motion  Wound Care: you may remove surgical dressing on post-op day #3 (Monday 05/17/20). Incisions can be left open to air if there is no drainage. If incision continues to have drainage, follow wound care instructions below. Okay to shower if no drainage from incisions.  DVT/PE prophylaxis: Aspirin 81 mg   Diet: as you were eating previously.  Can use over the counter stool softeners and bowel preparations, such as Miralax, to help with bowel movements.  Narcotics can be constipating.  Be sure to drink plenty of fluids  PAIN MEDICATION USE AND EXPECTATIONS  You have likely been given narcotic medications to help control your pain.  After a traumatic event that results in an fracture (broken bone) with or without surgery, it is ok to use narcotic pain medications to help control one's pain.  We understand that everyone responds to pain differently and each individual patient will be evaluated on a regular basis for the continued need for narcotic medications. Ideally, narcotic medication use should last no more than 6-8 weeks (coinciding with fracture healing).   As a patient it is your responsibility as well to monitor narcotic medication use and report the amount and frequency you use these medications when you come to your office visit.   We would also advise that if you are using narcotic medications, you should take a dose prior to therapy to maximize you participation.  IF YOU ARE ON NARCOTIC MEDICATIONS IT IS NOT PERMISSIBLE TO OPERATE A MOTOR VEHICLE (MOTORCYCLE/CAR/TRUCK/MOPED) OR HEAVY MACHINERY DO NOT MIX NARCOTICS WITH OTHER CNS (CENTRAL NERVOUS SYSTEM) DEPRESSANTS SUCH AS ALCOHOL   STOP SMOKING OR USING NICOTINE PRODUCTS!!!!  As discussed nicotine severely impairs  your body's ability to heal surgical and traumatic wounds but also impairs bone healing.  Wounds and bone heal by forming microscopic blood vessels (angiogenesis) and nicotine is a vasoconstrictor (essentially, shrinks blood vessels).  Therefore, if vasoconstriction occurs to these microscopic blood vessels they essentially disappear and are unable to deliver necessary nutrients to the healing tissue.  This is one modifiable factor that you can do to dramatically increase your chances of healing your injury.    (This means no smoking, no nicotine gum, patches, etc)     ICE AND ELEVATE INJURED/OPERATIVE EXTREMITY  Using ice and elevating the injured extremity above your heart can help with swelling and pain control.  Icing in a pulsatile fashion, such as 20 minutes on and 20 minutes off, can be followed.    Do not place ice directly on skin. Make sure there is a barrier between to skin and the ice pack.    Using frozen items such as frozen peas works well as the conform nicely to the are that needs to be iced.  USE AN ACE WRAP OR TED HOSE FOR SWELLING CONTROL  In addition to icing and elevation, Ace wraps or TED hose are used to help limit and resolve swelling.  It is recommended to use Ace wraps or TED hose until you are informed to stop.    When using Ace Wraps start the wrapping distally (farthest away from the body) and wrap proximally (closer to the body)   Example: If you had surgery on your leg or thing and you do not have a splint on, start the ace wrap at the toes and work  your way up to the thigh        If you had surgery on your upper extremity and do not have a splint on, start the ace wrap at your fingers and work your way up to the upper arm   [place]: [phone]   VISIT OUR WEBSITE FOR ADDITIONAL INFORMATION: [email or web address]    Discharge Wound Care Instructions  Do NOT apply any ointments, solutions or lotions to pin sites or surgical  wounds.  These prevent needed drainage and even though solutions like hydrogen peroxide kill bacteria, they also damage cells lining the pin sites that help fight infection.  Applying lotions or ointments can keep the wounds moist and can cause them to breakdown and open up as well. This can increase the risk for infection. When in doubt call the office.  Surgical incisions should be dressed daily.  If any drainage is noted, use one layer of adaptic, then gauze, Kerlix, and an ace wrap.  Once the incision is completely dry and without drainage, it may be left open to air out.  Showering may begin 36-48 hours later.  Cleaning gently with soap and water.  Traumatic wounds should be dressed daily as well.    One layer of adaptic, gauze, Kerlix, then ace wrap.  The adaptic can be discontinued once the draining has ceased    If you have a wet to dry dressing: wet the gauze with saline the squeeze as much saline out so the gauze is moist (not soaking wet), place moistened gauze over wound, then place a dry gauze over the moist one, followed by Kerlix wrap, then ace wrap.

## 2020-05-14 NOTE — Transfer of Care (Signed)
Immediate Anesthesia Transfer of Care Note  Patient: [name]  Procedure(s) Performed: HARDWARE REMOVAL RIGHT FEMUR (Right Leg Upper)  Patient Location: PACU  Anesthesia Type:General  Level of Consciousness: sedated and responds to stimulation  Airway & Oxygen Therapy: Patient Spontanous Breathing and Patient connected to face mask oxygen  Post-op Assessment: Report given to RN, Post -op Vital signs reviewed and stable and Patient moving all extremities  Post vital signs: Reviewed and stable  Last Vitals:  Vitals Value Taken Time  BP 156/67 05/14/20 0933  Temp    Pulse 64 05/14/20 0934  Resp 15 05/14/20 0934  SpO2 100 % 05/14/20 0934  Vitals shown include unvalidated device data.  Last Pain:  Vitals:   05/14/20 0605  TempSrc:   PainSc: 0-No pain         Complications: No complications documented.

## 2020-05-17 ENCOUNTER — Encounter (HOSPITAL_COMMUNITY): Payer: Self-pay | Admitting: Student

## 2020-09-15 ENCOUNTER — Telehealth: Payer: Self-pay

## 2020-09-15 ENCOUNTER — Other Ambulatory Visit: Payer: Self-pay

## 2020-09-15 DIAGNOSIS — I2584 Coronary atherosclerosis due to calcified coronary lesion: Secondary | ICD-10-CM

## 2020-09-15 DIAGNOSIS — I2510 Atherosclerotic heart disease of native coronary artery without angina pectoris: Secondary | ICD-10-CM

## 2020-09-15 MED ORDER — EZETIMIBE 10 MG PO TABS: 10.0000 mg | ORAL_TABLET | Freq: Every day | ORAL | 1 refills | Status: DC

## 2020-09-15 NOTE — Telephone Encounter (Signed)
Done patient's rx has been sent

## 2020-09-15 NOTE — Telephone Encounter (Signed)
60 pillsX1 refill F.u me as scheduled.  Thanks MJP

## 2020-10-22 ENCOUNTER — Encounter: Payer: Self-pay | Admitting: Cardiology

## 2020-10-22 ENCOUNTER — Other Ambulatory Visit: Payer: Self-pay

## 2020-10-22 ENCOUNTER — Ambulatory Visit: Payer: Medicare Other | Admitting: Cardiology

## 2020-10-22 VITALS — BP 137/53 | HR 66 | Temp 97.7°F | Resp 16 | Ht <= 58 in | Wt 105.0 lb

## 2020-10-22 DIAGNOSIS — Z01810 Encounter for preprocedural cardiovascular examination: Secondary | ICD-10-CM

## 2020-10-22 DIAGNOSIS — I10 Essential (primary) hypertension: Secondary | ICD-10-CM

## 2020-10-22 DIAGNOSIS — I2510 Atherosclerotic heart disease of native coronary artery without angina pectoris: Secondary | ICD-10-CM

## 2020-10-22 NOTE — Progress Notes (Signed)
Follow up visit  Subjective:   [name], female    DOB: 1930/04/24, 87 y.o.   MRN: [id number]    HPI  Chief Complaint  Patient presents with   Coronary Artery Disease   Hypertension   Medical Clearance    RT knee surgery    87 y/o Caucasian female with hypertension, CAD, h/o NSTEMI, h/o seizure disorder  Patient is here with her son.  Her activity is severely limited due to right knee pain.  She is eagerly awaiting surgery on this knee.  She denies any chest pain, shortness of breath symptoms.  She has not had any chest pain ever since her non-STEMI couple years ago.    Current Outpatient Medications on File Prior to Visit  Medication Sig Dispense Refill   acetaminophen (TYLENOL) 325 MG tablet Take 650 mg by mouth every 6 (six) hours as needed for moderate pain.     amLODipine (NORVASC) 10 MG tablet Take 1 tablet (10 mg total) by mouth daily. 30 tablet 0   aspirin EC 81 MG tablet Take 1 tablet (81 mg total) by mouth daily. Swallow whole. 90 tablet 3   Calcium Carb-Cholecalciferol (CALCIUM-VITAMIN D3) 600-400 MG-UNIT TABS Take 1 tablet by mouth daily.     Cholecalciferol 25 MCG (1000 UT) tablet Take 1,000 Units by mouth daily. With breakfast     Coenzyme Q10 60 MG TABS Take 60 mg by mouth daily with breakfast.      cyanocobalamin 1000 MCG tablet Take 1,000 mcg by mouth daily.     diphenoxylate-atropine (LOMOTIL) 2.5-0.025 MG tablet Take 1 tablet by mouth 4 (four) times daily as needed for diarrhea or loose stools. 30 tablet 0   ezetimibe (ZETIA) 10 MG tablet Take 1 tablet (10 mg total) by mouth daily. 60 tablet 1   levETIRAcetam (KEPPRA) 500 MG tablet Take 1 tablet (500 mg total) by mouth 2 (two) times daily. 60 tablet 0   nitroGLYCERIN (NITROSTAT) 0.4 MG SL tablet Place 1 tablet (0.4 mg total) under the tongue every 5 (five) minutes as needed for chest pain. 20 tablet 0   olmesartan (BENICAR) 20 MG tablet Take 1 tablet (20 mg total) by mouth daily. 30 tablet 0    Omega-3 Fatty Acids (FISH OIL) 1000 MG CAPS Take 1,000 mg by mouth daily.      oxybutynin (DITROPAN-XL) 10 MG 24 hr tablet Take 10 mg by mouth daily.     polyethylene glycol (MIRALAX / GLYCOLAX) 17 g packet Take 17 g by mouth daily as needed for mild constipation. 14 each 0   traMADol (ULTRAM) 50 MG tablet Take 1 tablet (50 mg total) by mouth every 6 (six) hours as needed for severe pain. 20 tablet 0   vitamin E 180 MG (400 UNITS) capsule Take 400 Units by mouth daily.     No current facility-administered medications on file prior to visit.    Cardiovascular & other pertient studies:  EKG 10/22/2020: Sinus rhythm 84 bpm with multifocal PACs Anteroseptal infarct -age undetermined  Echocardiogram 12/20/2019:  1. Limited study.   2. Left ventricular ejection fraction, by estimation, is 60 to 65%. The  left ventricle has normal function. The left ventricle has no regional  wall motion abnormalities. There is mild left ventricular hypertrophy.  Left ventricular diastolic function  could not be evaluated.   3. Right ventricular systolic function is normal. The right ventricular  size is normal. There is moderately elevated pulmonary artery systolic  pressure. The estimated right ventricular  systolic pressure is 55.7 mmHg.   4. The mitral valve is abnormal, mildly thickened and calcified. Trivial  mitral valve regurgitation.   5. The aortic valve is tricuspid. Aortic valve regurgitation is not  visualized. Mild aortic valve sclerosis is present, with no evidence of  aortic valve stenosis.   6. The inferior vena cava is normal in size with greater than 50%  respiratory variability, suggesting right atrial pressure of 3 mmHg.   Recent labs: 05/14/2020: Glucose 104, BUN/Cr 18/0.86. EGFR ?60. Na/K 137/3.7 H/H 11/24. MCV 92. Platelets 266  08/06/2019: Glucose 97, BUN/Cr 19/0.77. EGFR 70. Na/K 140/4.2. Rest of the CMP normal H/H 9.9/29.2. MCV 92. Platelets 225  04/2018: Glucose 126, BUN/Cr  26/0.8. EGFR >60. Na/K 135/4.1. Rest of the CMP normal H/H 9.6/29.8. MCV 97. Platelets 120     Review of Systems  Cardiovascular:  Negative for chest pain, dyspnea on exertion, leg swelling, palpitations and syncope.  Musculoskeletal:  Positive for joint pain.        Vitals:   10/22/20 1359  BP: (!) 137/53  Pulse: 66  Resp: 16  Temp: 97.7 F (36.5 C)  SpO2: 99%     Body mass index is 21.95 kg/m. Filed Weights   10/22/20 1359  Weight: 105 lb (47.6 kg)     Objective:   Physical Exam Vitals and nursing note reviewed.  Constitutional:      General: She is not in acute distress. Neck:     Vascular: No JVD.  Cardiovascular:     Rate and Rhythm: Normal rate and regular rhythm.     Heart sounds: Normal heart sounds. No murmur heard. Pulmonary:     Effort: Pulmonary effort is normal.     Breath sounds: Normal breath sounds. No wheezing or rales.  Musculoskeletal:     Right lower leg: No edema.     Left lower leg: No edema.          Assessment & Recommendations:   87 y/o Caucasian female with hypertension, CAD, h/o NSTEMI, h/o seizure disorder  Preop risk stratification: Perioperative cardiac risk is low to moderate by virtue of her age, and prior history of non-STEMI.  That said, she does not have any ongoing cardiac symptoms at this time.  Knee replacement is not a highly vascular surgery.  I do not think noninvasive testing would change our decision making, as her lifestyle is clearly limited by her knee pain more than anything else.  After discussion with the patient and son, I decided to do to proceed with upcoming knee replacement surgery without any further cardiac work-up.  PAC: EKG today shows multifocal PACs.  I believe she is at risk of developing atrial fibrillation, although does not have it at this time.  Should she develop atrial fibrillation, management will largely be rate control.  If she does develop atrial fibrillation, anticoagulation would be  recommended, but can be started after her knee surgery.  CAD: Stable. Prior NSTEMI. No ongoing angina symptoms. Stopped plavix. Continue Aspirin. Continue Zetia.  Hypertension: Reasonably well controlled. Only mildly elevated today.  F/u in 3 months  [place], MD [facility] Pager: [phone] Office: [phone]
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
## 2020-10-29 NOTE — Progress Notes (Signed)
DUE TO COVID-19 ONLY ONE VISITOR IS ALLOWED TO COME WITH YOU AND STAY IN THE WAITING ROOM ONLY DURING PRE OP AND PROCEDURE DAY OF SURGERY. THE 1 VISITOR  MAY VISIT WITH YOU AFTER SURGERY IN YOUR PRIVATE ROOM DURING VISITING HOURS ONLY!  YOU NEED TO HAVE A COVID 19 TEST ON___7/25/2022 ____ @_______ , THIS TEST MUST BE DONE BEFORE SURGERY,  COVID TESTING SITE [address] [place] [address], IT IS ON THE RIGHT GOING OUT [address] APPROXIMATELY  2 MINUTES PAST [employer] ON THE RIGHT. ONCE YOUR COVID TEST IS COMPLETED,  PLEASE BEGIN THE QUARANTINE INSTRUCTIONS AS OUTLINED IN YOUR HANDOUT.                [name]  10/29/2020   Your procedure is scheduled on:  11/17/2020   Report to [facility] Main  Entrance   Report to admitting at    1010AM     Call this number if you have problems the morning of surgery [phone]    REMEMBER: NO  SOLID FOOD CANDY OR GUM AFTER MIDNIGHT. CLEAR LIQUIDS UNTIL  0940am        . NOTHING BY MOUTH EXCEPT CLEAR LIQUIDS UNTIL  0940am   . PLEASE FINISH ENSURE DRINK PER SURGEON ORDER  WHICH NEEDS TO BE COMPLETED AT      0940am   .      CLEAR LIQUID DIET   Foods Allowed                                                                    Coffee and tea, regular and decaf                            Fruit ices (not with fruit pulp)                                      Iced Popsicles                                    Carbonated beverages, regular and diet                                    Cranberry, grape and apple juices Sports drinks like Gatorade Lightly seasoned clear broth or consume(fat free) Sugar, honey syrup ___________________________________________________________________      BRUSH YOUR TEETH MORNING OF SURGERY AND RINSE YOUR MOUTH OUT, NO CHEWING GUM CANDY OR MINTS.     Take these medicines the morning of surgery with A SIP OF WATER:  ditropan, keppra, amlodipine   DO NOT TAKE ANY DIABETIC  MEDICATIONS DAY OF YOUR SURGERY                               You may not have any metal on your body including hair pins and  piercings  Do not wear jewelry, make-up, lotions, powders or perfumes, deodorant             Do not wear nail polish on your fingernails.  Do not shave  48 hours prior to surgery.              Men may shave face and neck.   Do not bring valuables to the hospital. [place].  Contacts, dentures or bridgework may not be worn into surgery.  Leave suitcase in the car. After surgery it may be brought to your room.     Patients discharged the day of surgery will not be allowed to drive home. IF YOU ARE HAVING SURGERY AND GOING HOME THE SAME DAY, YOU MUST HAVE AN ADULT TO DRIVE YOU HOME AND BE WITH YOU FOR 24 HOURS. YOU MAY GO HOME BY TAXI OR UBER OR ORTHERWISE, BUT AN ADULT MUST ACCOMPANY YOU HOME AND STAY WITH YOU FOR 24 HOURS.  Name and phone number of your driver:  Special Instructions: N/A              Please read over the following fact sheets you were given: _____________________________________________________________________  [facility] - Preparing for Surgery Before surgery, you can play an important role.  Because skin is not sterile, your skin needs to be as free of germs as possible.  You can reduce the number of germs on your skin by washing with CHG (chlorahexidine gluconate) soap before surgery.  CHG is an antiseptic cleaner which kills germs and bonds with the skin to continue killing germs even after washing. Please DO NOT use if you have an allergy to CHG or antibacterial soaps.  If your skin becomes reddened/irritated stop using the CHG and inform your nurse when you arrive at Short Stay. Do not shave (including legs and underarms) for at least 48 hours prior to the first CHG shower.  You may shave your face/neck. Please follow these instructions carefully:  1.  Shower with CHG Soap the night  before surgery and the  morning of Surgery.  2.  If you choose to wash your hair, wash your hair first as usual with your  normal  shampoo.  3.  After you shampoo, rinse your hair and body thoroughly to remove the  shampoo.                           4.  Use CHG as you would any other liquid soap.  You can apply chg directly  to the skin and wash                       Gently with a scrungie or clean washcloth.  5.  Apply the CHG Soap to your body ONLY FROM THE NECK DOWN.   Do not use on face/ open                           Wound or open sores. Avoid contact with eyes, ears mouth and genitals (private parts).                       Wash face,  Genitals (private parts) with your normal soap.             6.  Wash thoroughly, paying special attention to the area where your surgery  will be performed.  7.  Thoroughly rinse your body with warm water from the neck down.  8.  DO NOT shower/wash with your normal soap after using and rinsing off  the CHG Soap.                9.  Pat yourself dry with a clean towel.            10.  Wear clean pajamas.            11.  Place clean sheets on your bed the night of your first shower and do not  sleep with pets. Day of Surgery : Do not apply any lotions/deodorants the morning of surgery.  Please wear clean clothes to the hospital/surgery center.  FAILURE TO FOLLOW THESE INSTRUCTIONS MAY RESULT IN THE CANCELLATION OF YOUR SURGERY PATIENT SIGNATURE_________________________________  NURSE SIGNATURE__________________________________  ________________________________________________________________________

## 2020-11-04 ENCOUNTER — Encounter (HOSPITAL_COMMUNITY)
Admission: RE | Admit: 2020-11-04 | Discharge: 2020-11-04 | Disposition: A | Discharge status: Home / Self Care | Payer: Medicare Other | Source: Ambulatory Visit | Attending: Orthopedic Surgery | Admitting: Orthopedic Surgery

## 2020-11-04 ENCOUNTER — Other Ambulatory Visit: Payer: Self-pay

## 2020-11-04 ENCOUNTER — Encounter (HOSPITAL_COMMUNITY): Payer: Self-pay

## 2020-11-04 DIAGNOSIS — Z01812 Encounter for preprocedural laboratory examination: Secondary | ICD-10-CM | POA: Diagnosis not present

## 2020-11-04 HISTORY — DX: Anemia, unspecified: D64.9

## 2020-11-04 HISTORY — DX: Pneumonia, unspecified organism: J18.9

## 2020-11-04 LAB — COMPREHENSIVE METABOLIC PANEL
ALT: 17 U/L (ref 0–44)
AST: 24 U/L (ref 15–41)
Albumin: 4.3 g/dL (ref 3.5–5.0)
Alkaline Phosphatase: 98 U/L (ref 38–126)
Anion gap: 6 (ref 5–15)
BUN: 23 mg/dL (ref 8–23)
CO2: 26 mmol/L (ref 22–32)
Calcium: 9.6 mg/dL (ref 8.9–10.3)
Chloride: 110 mmol/L (ref 98–111)
Creatinine, Ser: 0.73 mg/dL (ref 0.44–1.00)
GFR, Estimated: >60 mL/min (ref >60)
Glucose, Bld: 103 mg/dL — ABNORMAL HIGH (ref 70–99)
Potassium: 4.2 mmol/L (ref 3.5–5.1)
Sodium: 142 mmol/L (ref 135–145)
Total Bilirubin: 0.5 mg/dL (ref 0.3–1.2)
Total Protein: 7.5 g/dL (ref 6.5–8.1)

## 2020-11-04 LAB — CBC
HCT: 34.1 % — ABNORMAL LOW (ref 36.0–46.0)
Hemoglobin: 10.9 g/dL — ABNORMAL LOW (ref 12.0–15.0)
MCH: 30.8 pg (ref 26.0–34.0)
MCHC: 32 g/dL (ref 30.0–36.0)
MCV: 96.3 fL (ref 80.0–100.0)
Platelets: 160 10*3/uL (ref 150–400)
RBC: 3.54 MIL/uL — ABNORMAL LOW (ref 3.87–5.11)
RDW: 14.1 % (ref 11.5–15.5)
WBC: 5.3 10*3/uL (ref 4.0–10.5)
nRBC: 0 % (ref 0.0–0.2)

## 2020-11-04 LAB — PROTIME-INR
INR: 1.1 (ref 0.8–1.2)
Prothrombin Time: 14.2 seconds (ref 11.4–15.2)

## 2020-11-04 LAB — SURGICAL PCR SCREEN
MRSA, PCR: NEGATIVE
Staphylococcus aureus: POSITIVE — AB

## 2020-11-04 NOTE — Progress Notes (Addendum)
Anesthesia Review:  PCP: DR Bernerd Limbo - Clearance on chart dated 08/26/2020 - LOV 08/26/20  Cardiologist : DR patwardhan Villalba 10/22/20- clearance in note  Chest x-ray : EKG :10/22/2020  Echo : 11/2019  Stress test: Cardiac Cath :  Activity level:  Sleep Study/ CPAP : hascpap  Fasting Blood Sugar :      / Checks Blood Sugar -- times a day:   Blood Thinner/ Instructions /Last Dose: ASA / Instructions/ Last Dose :  8 1mg  Aspirin   Cbc done 11/04/20 routed to DR Aluiiso.

## 2020-11-05 NOTE — Progress Notes (Signed)
Anesthesia Chart Review   Case: [id number] Date/Time: 11/15/20 1225   Procedure: TOTAL KNEE ARTHROPLASTY (Right: Knee) - 2min   Anesthesia type: Choice   Pre-op diagnosis: right knee osteoarthritis   Location: [facility] ROOM 10 / [facility]   Surgeons: Gaynelle Arabian, MD       DISCUSSION:87 y.o. never smoker with h/o HTN, CHF, CAD, CKD Stage III, OSA, right knee OA scheduled for above procedure 11/15/2020 with Dr. Gaynelle Arabian.  Pt last seen by cardiology 10/22/2020. Per OV note, "Perioperative cardiac risk is low to moderate by virtue of her age, and prior history of non-STEMI.  That said, she does not have any ongoing cardiac symptoms at this time.  Knee replacement is not a highly vascular surgery.  I do not think noninvasive testing would change our decision making, as her lifestyle is clearly limited by her knee pain more than anything else.  After discussion with the patient and son, I decided to do to proceed with upcoming knee replacement surgery without any further cardiac work-up."  Anticipate pt can proceed with planned procedure barring acute status change.   VS: BP (!) 131/54   Pulse 67   Temp 36.8 C (Oral)   Resp 16   Ht 4\' 8"  (1.422 m)   LMP  (LMP Unknown)   SpO2 100%   BMI 23.54 kg/m   PROVIDERS: Bernerd Limbo, MD is PCP   Vernell Leep, MD is Cardiologist  LABS: Labs reviewed: Acceptable for surgery. (all labs ordered are listed, but only abnormal results are displayed)  Labs Reviewed  SURGICAL PCR SCREEN - Abnormal; Notable for the following components:      Result Value   Staphylococcus aureus POSITIVE (*)    All other components within normal limits  CBC - Abnormal; Notable for the following components:   RBC 3.54 (*)    Hemoglobin 10.9 (*)    HCT 34.1 (*)    All other components within normal limits  COMPREHENSIVE METABOLIC PANEL - Abnormal; Notable for the following components:   Glucose, Bld 103 (*)    All other components within normal limits   PROTIME-INR     IMAGES:   EKG 10/22/2020: Sinus rhythm 84 bpm with multifocal PACs Anteroseptal infarct -age undetermined  CV: Echo 12/20/2019 1. Limited study.   2. Left ventricular ejection fraction, by estimation, is 60 to 65%. The  left ventricle has normal function. The left ventricle has no regional  wall motion abnormalities. There is mild left ventricular hypertrophy.  Left ventricular diastolic function  could not be evaluated.   3. Right ventricular systolic function is normal. The right ventricular  size is normal. There is moderately elevated pulmonary artery systolic  pressure. The estimated right ventricular systolic pressure is 52.8 mmHg.   4. The mitral valve is abnormal, mildly thickened and calcified. Trivial  mitral valve regurgitation.   5. The aortic valve is tricuspid. Aortic valve regurgitation is not  visualized. Mild aortic valve sclerosis is present, with no evidence of  aortic valve stenosis.   6. The inferior vena cava is normal in size with greater than 50%  respiratory variability, suggesting right atrial pressure of 3 mmHg.  Past Medical History:  Diagnosis Date   Abdominal pain, other specified site    Allergic rhinitis    Anemia    Arthritis    CAD (coronary artery disease) 12/2017   s/p stent   Chronic kidney disease    stage 3 kidney disease per patient on 05/12/20  Diastolic CHF ([place]) 68/1275   grade 1   Essential hypertension, benign 06/17/2018   History of non-ST elevation myocardial infarction (NSTEMI) 06/17/2018   HLD (hyperlipidemia)    Hypertension    Irregular heart rate    Ischemic cardiomyopathy 06/17/2018   Myocardial infarction ([place]) 12/2017    [facility]   OSA (obstructive sleep apnea)    uses adaptive servo ventilation machine at night   Pneumonia    Seizure disorder ([place])    last seizure in the 1950s-well controlled with keppra   Skin cancer    left arm    Past Surgical History:  Procedure Laterality Date    BACK SURGERY     CARDIAC CATHETERIZATION  02/09/2000   CHOLECYSTECTOMY     EYE SURGERY Bilateral    cataracts removed   HARDWARE REMOVAL Right 05/14/2020   Procedure: HARDWARE REMOVAL RIGHT FEMUR;  Surgeon: Shona Needles, MD;  Location: [place];  Service: Orthopedics;  Laterality: Right;   KNEE SURGERY     laparoscopic   ORIF FEMUR FRACTURE Right 12/15/2019   Procedure: OPEN REDUCTION INTERNAL FIXATION (ORIF) DISTAL FEMUR FRACTURE;  Surgeon: Shona Needles, MD;  Location: [facility];  Service: Orthopedics;  Laterality: Right;   TOTAL ABDOMINAL HYSTERECTOMY     WRIST FRACTURE SURGERY Bilateral    x 2    MEDICATIONS:  acetaminophen (TYLENOL) 325 MG tablet   amLODipine (NORVASC) 10 MG tablet   aspirin EC 81 MG tablet   Cholecalciferol 25 MCG (1000 UT) tablet   Coenzyme Q10 60 MG TABS   diphenoxylate-atropine (LOMOTIL) 2.5-0.025 MG tablet   ezetimibe (ZETIA) 10 MG tablet   fluocinonide ointment (LIDEX) 0.05 %   levETIRAcetam (KEPPRA) 500 MG tablet   nitroGLYCERIN (NITROSTAT) 0.4 MG SL tablet   olmesartan (BENICAR) 20 MG tablet   Omega-3 Fatty Acids (FISH OIL) 1000 MG CAPS   oxybutynin (DITROPAN-XL) 10 MG 24 hr tablet   traMADol (ULTRAM) 50 MG tablet   vitamin B-12 (CYANOCOBALAMIN) 500 MCG tablet   No current facility-administered medications for this encounter.     Konrad Felix, PA-C [facility] [phone]

## 2020-11-05 NOTE — Anesthesia Preprocedure Evaluation (Addendum)
Anesthesia Evaluation  Patient identified by MRN, date of birth, ID band Patient awake    Reviewed: Allergy & Precautions, NPO status , Patient's Chart, lab work & pertinent test results  History of Anesthesia Complications Negative for: history of anesthetic complications  Airway Mallampati: II  TM Distance: >3 FB Neck ROM: Full    Dental  (+) Missing, Dental Advisory Given   Pulmonary sleep apnea and Continuous Positive Airway Pressure Ventilation ,  11/11/2020 SARS coronavirus NEG   breath sounds clear to auscultation       Cardiovascular hypertension, Pt. on medications (-) angina+ CAD, + Past MI (2020, 2019) and + Cardiac Stents   Rhythm:Regular Rate:Normal  2021 ECHO: EF 60-65%, normal LVF, mildly thickened mitral valve with trivial MR   Neuro/Psych Seizures -, Well Controlled,     GI/Hepatic negative GI ROS, Neg liver ROS,   Endo/Other  negative endocrine ROS  Renal/GU Renal InsufficiencyRenal disease     Musculoskeletal  (+) Arthritis , Osteoarthritis,    Abdominal   Peds  Hematology  (+) Blood dyscrasia (Hb 10.9), anemia ,   Anesthesia Other Findings   Reproductive/Obstetrics                           Anesthesia Physical Anesthesia Plan  ASA: 3  Anesthesia Plan: Spinal   Post-op Pain Management:  Regional for Post-op pain   Induction:   PONV Risk Score and Plan: 2 and Ondansetron, Dexamethasone and Treatment may vary due to age or medical condition  Airway Management Planned: Natural Airway and Simple Face Mask  Additional Equipment: None  Intra-op Plan:   Post-operative Plan:   Informed Consent: I have reviewed the patients History and Physical, chart, labs and discussed the procedure including the risks, benefits and alternatives for the proposed anesthesia with the patient or authorized representative who has indicated his/her understanding and acceptance.      Dental advisory given  Plan Discussed with: CRNA and Surgeon  Anesthesia Plan Comments: (See PAT note 11/04/2020, Konrad Felix, PA-C Plan routine monitors, SAB with adductor canal block for post op analgesia)      Anesthesia Quick Evaluation

## 2020-11-11 ENCOUNTER — Other Ambulatory Visit (HOSPITAL_COMMUNITY)
Admission: RE | Admit: 2020-11-11 | Discharge: 2020-11-11 | Disposition: A | Discharge status: Home / Self Care | Payer: Medicare Other | Source: Ambulatory Visit | Attending: Orthopedic Surgery | Admitting: Orthopedic Surgery

## 2020-11-11 DIAGNOSIS — Z01812 Encounter for preprocedural laboratory examination: Secondary | ICD-10-CM | POA: Diagnosis present

## 2020-11-11 DIAGNOSIS — Z20822 Contact with and (suspected) exposure to covid-19: Secondary | ICD-10-CM | POA: Insufficient documentation

## 2020-11-11 LAB — SARS CORONAVIRUS 2 (TAT 6-24 HRS): SARS Coronavirus 2: NEGATIVE

## 2020-11-14 MED ORDER — BUPIVACAINE LIPOSOME 1.3 % IJ SUSP
20.0000 mL | Freq: Once | INTRAMUSCULAR | Status: DC
  Filled 2020-11-14: qty 20

## 2020-11-15 ENCOUNTER — Observation Stay (HOSPITAL_COMMUNITY)
Admission: RE | Admit: 2020-11-15 | Discharge: 2020-11-18 | Disposition: A | Discharge status: Home / Self Care | Payer: Medicare Other | Attending: Orthopedic Surgery | Admitting: Orthopedic Surgery

## 2020-11-15 ENCOUNTER — Encounter (HOSPITAL_COMMUNITY)
Admission: RE | Disposition: A | Discharge status: Home / Self Care | Payer: Self-pay | Source: Home / Self Care | Attending: Orthopedic Surgery

## 2020-11-15 ENCOUNTER — Encounter (HOSPITAL_COMMUNITY): Payer: Self-pay | Admitting: Orthopedic Surgery

## 2020-11-15 ENCOUNTER — Ambulatory Visit (HOSPITAL_COMMUNITY): Payer: Medicare Other | Admitting: Physician Assistant

## 2020-11-15 ENCOUNTER — Ambulatory Visit (HOSPITAL_COMMUNITY): Payer: Medicare Other | Admitting: Anesthesiology

## 2020-11-15 ENCOUNTER — Other Ambulatory Visit: Payer: Self-pay

## 2020-11-15 DIAGNOSIS — Z91040 Latex allergy status: Secondary | ICD-10-CM | POA: Diagnosis not present

## 2020-11-15 DIAGNOSIS — Z7982 Long term (current) use of aspirin: Secondary | ICD-10-CM | POA: Diagnosis not present

## 2020-11-15 DIAGNOSIS — N183 Chronic kidney disease, stage 3 unspecified: Secondary | ICD-10-CM | POA: Insufficient documentation

## 2020-11-15 DIAGNOSIS — Z85828 Personal history of other malignant neoplasm of skin: Secondary | ICD-10-CM | POA: Diagnosis not present

## 2020-11-15 DIAGNOSIS — I130 Hypertensive heart and chronic kidney disease with heart failure and stage 1 through stage 4 chronic kidney disease, or unspecified chronic kidney disease: Secondary | ICD-10-CM | POA: Insufficient documentation

## 2020-11-15 DIAGNOSIS — I2510 Atherosclerotic heart disease of native coronary artery without angina pectoris: Secondary | ICD-10-CM | POA: Diagnosis not present

## 2020-11-15 DIAGNOSIS — M1711 Unilateral primary osteoarthritis, right knee: Secondary | ICD-10-CM | POA: Diagnosis not present

## 2020-11-15 DIAGNOSIS — M179 Osteoarthritis of knee, unspecified: Secondary | ICD-10-CM

## 2020-11-15 DIAGNOSIS — I5030 Unspecified diastolic (congestive) heart failure: Secondary | ICD-10-CM | POA: Diagnosis not present

## 2020-11-15 DIAGNOSIS — M1710 Unilateral primary osteoarthritis, unspecified knee: Secondary | ICD-10-CM | POA: Diagnosis present

## 2020-11-15 DIAGNOSIS — I252 Old myocardial infarction: Secondary | ICD-10-CM | POA: Diagnosis not present

## 2020-11-15 DIAGNOSIS — Z79899 Other long term (current) drug therapy: Secondary | ICD-10-CM | POA: Insufficient documentation

## 2020-11-15 HISTORY — PX: TOTAL KNEE ARTHROPLASTY: SHX125

## 2020-11-15 SURGERY — ARTHROPLASTY, KNEE, TOTAL
Anesthesia: Spinal | Site: Knee | Laterality: Right

## 2020-11-15 MED ORDER — METHOCARBAMOL 500 MG PO TABS
500.0000 mg | ORAL_TABLET | Freq: Four times a day (QID) | ORAL | Status: DC | PRN
  Administered 2020-11-15 – 2020-11-17 (×2): 500 mg via ORAL
  Filled 2020-11-15 (×3): qty 1

## 2020-11-15 MED ORDER — BISACODYL 10 MG RE SUPP: 10.0000 mg | Freq: Every day | RECTAL | Status: DC | PRN

## 2020-11-15 MED ORDER — STERILE WATER FOR IRRIGATION IR SOLN
Status: DC | PRN
  Administered 2020-11-15: 2000 mL

## 2020-11-15 MED ORDER — POLYETHYLENE GLYCOL 3350 17 G PO PACK: 17.0000 g | PACK | Freq: Every day | ORAL | Status: DC | PRN

## 2020-11-15 MED ORDER — ACETAMINOPHEN 10 MG/ML IV SOLN
1000.0000 mg | Freq: Four times a day (QID) | INTRAVENOUS | Status: DC
  Administered 2020-11-15: 1000 mg via INTRAVENOUS
  Filled 2020-11-15: qty 100

## 2020-11-15 MED ORDER — MIDAZOLAM HCL 2 MG/2ML IJ SOLN: 0.5000 mg | Freq: Once | INTRAMUSCULAR | Status: DC | PRN

## 2020-11-15 MED ORDER — METOCLOPRAMIDE HCL 5 MG/ML IJ SOLN: 5.0000 mg | Freq: Three times a day (TID) | INTRAMUSCULAR | Status: DC | PRN

## 2020-11-15 MED ORDER — ROPIVACAINE HCL 7.5 MG/ML IJ SOLN
INTRAMUSCULAR | Status: DC | PRN
  Administered 2020-11-15: 20 mL via PERINEURAL

## 2020-11-15 MED ORDER — MEPERIDINE HCL 50 MG/ML IJ SOLN: 6.2500 mg | INTRAMUSCULAR | Status: DC | PRN

## 2020-11-15 MED ORDER — EZETIMIBE 10 MG PO TABS
10.0000 mg | ORAL_TABLET | Freq: Every day | ORAL | Status: DC
  Administered 2020-11-16 – 2020-11-18 (×3): 10 mg via ORAL
  Filled 2020-11-15 (×3): qty 1

## 2020-11-15 MED ORDER — PROPOFOL 500 MG/50ML IV EMUL
INTRAVENOUS | Status: DC | PRN
  Administered 2020-11-15: 75 ug/kg/min via INTRAVENOUS

## 2020-11-15 MED ORDER — OXYCODONE HCL 5 MG/5ML PO SOLN: 5.0000 mg | Freq: Once | ORAL | Status: DC | PRN

## 2020-11-15 MED ORDER — HYDROCODONE-ACETAMINOPHEN 5-325 MG PO TABS
1.0000 | ORAL_TABLET | ORAL | Status: DC | PRN
Start: 2020-11-15 — End: 2020-11-18
  Administered 2020-11-15 – 2020-11-18 (×4): 1 via ORAL
  Filled 2020-11-15 (×6): qty 1

## 2020-11-15 MED ORDER — DOCUSATE SODIUM 100 MG PO CAPS
100.0000 mg | ORAL_CAPSULE | Freq: Two times a day (BID) | ORAL | Status: DC
  Administered 2020-11-15 – 2020-11-18 (×6): 100 mg via ORAL
  Filled 2020-11-15 (×6): qty 1

## 2020-11-15 MED ORDER — DEXAMETHASONE SODIUM PHOSPHATE 10 MG/ML IJ SOLN
INTRAMUSCULAR | Status: DC | PRN
  Administered 2020-11-15: 10 mg via INTRAVENOUS

## 2020-11-15 MED ORDER — ACETAMINOPHEN 500 MG PO TABS
1000.0000 mg | ORAL_TABLET | Freq: Four times a day (QID) | ORAL | Status: AC
  Administered 2020-11-15 – 2020-11-16 (×3): 1000 mg via ORAL
  Filled 2020-11-15 (×4): qty 2

## 2020-11-15 MED ORDER — LEVETIRACETAM 500 MG PO TABS
500.0000 mg | ORAL_TABLET | Freq: Every day | ORAL | Status: DC
  Administered 2020-11-15 – 2020-11-18 (×4): 500 mg via ORAL
  Filled 2020-11-15 (×4): qty 1

## 2020-11-15 MED ORDER — TRAMADOL HCL 50 MG PO TABS
50.0000 mg | ORAL_TABLET | Freq: Four times a day (QID) | ORAL | Status: DC | PRN
  Administered 2020-11-15 – 2020-11-18 (×3): 100 mg via ORAL
  Filled 2020-11-15 (×3): qty 2

## 2020-11-15 MED ORDER — OXYBUTYNIN CHLORIDE ER 5 MG PO TB24
10.0000 mg | ORAL_TABLET | Freq: Every day | ORAL | Status: DC
  Administered 2020-11-16 – 2020-11-17 (×2): 10 mg via ORAL
  Filled 2020-11-15 (×3): qty 2

## 2020-11-15 MED ORDER — LACTATED RINGERS IV SOLN: INTRAVENOUS | Status: DC | PRN

## 2020-11-15 MED ORDER — ONDANSETRON HCL 4 MG/2ML IJ SOLN: 4.0000 mg | Freq: Four times a day (QID) | INTRAMUSCULAR | Status: DC | PRN

## 2020-11-15 MED ORDER — SODIUM CHLORIDE (PF) 0.9 % IJ SOLN
INTRAMUSCULAR | Status: DC | PRN
  Administered 2020-11-15: 60 mL

## 2020-11-15 MED ORDER — MENTHOL 3 MG MT LOZG: 1.0000 | LOZENGE | OROMUCOSAL | Status: DC | PRN

## 2020-11-15 MED ORDER — DIPHENHYDRAMINE HCL 12.5 MG/5ML PO ELIX: 12.5000 mg | ORAL_SOLUTION | ORAL | Status: DC | PRN

## 2020-11-15 MED ORDER — FENTANYL CITRATE (PF) 100 MCG/2ML IJ SOLN
INTRAMUSCULAR | Status: AC
  Administered 2020-11-15: 50 ug
  Filled 2020-11-15: qty 2

## 2020-11-15 MED ORDER — SODIUM CHLORIDE 0.9 % IV SOLN
2.0000 g | INTRAVENOUS | Status: AC
  Administered 2020-11-15: 2 g via INTRAVENOUS
  Filled 2020-11-15: qty 2

## 2020-11-15 MED ORDER — POVIDONE-IODINE 10 % EX SWAB
2.0000 "application " | Freq: Once | CUTANEOUS | Status: AC
  Administered 2020-11-15: 2 via TOPICAL

## 2020-11-15 MED ORDER — FENTANYL CITRATE (PF) 100 MCG/2ML IJ SOLN: 50.0000 ug | INTRAMUSCULAR | Status: DC

## 2020-11-15 MED ORDER — MORPHINE SULFATE (PF) 2 MG/ML IV SOLN: 0.5000 mg | INTRAVENOUS | Status: DC | PRN

## 2020-11-15 MED ORDER — CEFAZOLIN SODIUM 2 G IJ SOLR
2.0000 g | Freq: Four times a day (QID) | INTRAMUSCULAR | Status: AC
  Administered 2020-11-15 – 2020-11-16 (×2): 2 g via INTRAVENOUS
  Filled 2020-11-15 (×2): qty 2

## 2020-11-15 MED ORDER — EPHEDRINE SULFATE-NACL 50-0.9 MG/10ML-% IV SOSY
PREFILLED_SYRINGE | INTRAVENOUS | Status: DC | PRN
  Administered 2020-11-15: 10 mg via INTRAVENOUS

## 2020-11-15 MED ORDER — PHENYLEPHRINE 40 MCG/ML (10ML) SYRINGE FOR IV PUSH (FOR BLOOD PRESSURE SUPPORT)
PREFILLED_SYRINGE | INTRAVENOUS | Status: DC | PRN
Start: 2020-11-15 — End: 2020-11-15
  Administered 2020-11-15 (×3): 80 ug via INTRAVENOUS

## 2020-11-15 MED ORDER — SODIUM CHLORIDE 0.9 % IV SOLN: INTRAVENOUS | Status: DC

## 2020-11-15 MED ORDER — PHENOL 1.4 % MT LIQD: 1.0000 | OROMUCOSAL | Status: DC | PRN

## 2020-11-15 MED ORDER — AMLODIPINE BESYLATE 10 MG PO TABS
10.0000 mg | ORAL_TABLET | Freq: Every day | ORAL | Status: DC
  Administered 2020-11-16 – 2020-11-18 (×3): 10 mg via ORAL
  Filled 2020-11-15 (×3): qty 1

## 2020-11-15 MED ORDER — DEXAMETHASONE SODIUM PHOSPHATE 10 MG/ML IJ SOLN
10.0000 mg | Freq: Once | INTRAMUSCULAR | Status: AC
  Administered 2020-11-16: 10 mg via INTRAVENOUS
  Filled 2020-11-15: qty 1

## 2020-11-15 MED ORDER — OXYCODONE HCL 5 MG PO TABS: 5.0000 mg | ORAL_TABLET | Freq: Once | ORAL | Status: DC | PRN

## 2020-11-15 MED ORDER — LACTATED RINGERS IV SOLN: INTRAVENOUS | Status: DC

## 2020-11-15 MED ORDER — SODIUM CHLORIDE (PF) 0.9 % IJ SOLN
INTRAMUSCULAR | Status: AC
  Filled 2020-11-15: qty 10

## 2020-11-15 MED ORDER — 0.9 % SODIUM CHLORIDE (POUR BTL) OPTIME
TOPICAL | Status: DC | PRN
  Administered 2020-11-15: 1000 mL

## 2020-11-15 MED ORDER — NITROGLYCERIN 0.4 MG SL SUBL: 0.4000 mg | SUBLINGUAL_TABLET | SUBLINGUAL | Status: DC | PRN

## 2020-11-15 MED ORDER — TRANEXAMIC ACID-NACL 1000-0.7 MG/100ML-% IV SOLN
1000.0000 mg | INTRAVENOUS | Status: AC
  Administered 2020-11-15: 1000 mg via INTRAVENOUS
  Filled 2020-11-15: qty 100

## 2020-11-15 MED ORDER — FENTANYL CITRATE (PF) 100 MCG/2ML IJ SOLN: 25.0000 ug | INTRAMUSCULAR | Status: DC | PRN

## 2020-11-15 MED ORDER — METHOCARBAMOL 500 MG IVPB - SIMPLE MED
500.0000 mg | Freq: Four times a day (QID) | INTRAVENOUS | Status: DC | PRN
  Filled 2020-11-15: qty 50

## 2020-11-15 MED ORDER — METOCLOPRAMIDE HCL 5 MG PO TABS: 5.0000 mg | ORAL_TABLET | Freq: Three times a day (TID) | ORAL | Status: DC | PRN

## 2020-11-15 MED ORDER — DEXAMETHASONE SODIUM PHOSPHATE 10 MG/ML IJ SOLN: 8.0000 mg | Freq: Once | INTRAMUSCULAR | Status: DC

## 2020-11-15 MED ORDER — FLEET ENEMA 7-19 GM/118ML RE ENEM: 1.0000 | ENEMA | Freq: Once | RECTAL | Status: DC | PRN

## 2020-11-15 MED ORDER — SODIUM CHLORIDE 0.9 % IR SOLN
Status: DC | PRN
  Administered 2020-11-15: 1000 mL

## 2020-11-15 MED ORDER — ONDANSETRON HCL 4 MG PO TABS: 4.0000 mg | ORAL_TABLET | Freq: Four times a day (QID) | ORAL | Status: DC | PRN

## 2020-11-15 MED ORDER — PROMETHAZINE HCL 25 MG/ML IJ SOLN: 6.2500 mg | INTRAMUSCULAR | Status: DC | PRN

## 2020-11-15 MED ORDER — ONDANSETRON HCL 4 MG/2ML IJ SOLN
INTRAMUSCULAR | Status: DC | PRN
  Administered 2020-11-15: 4 mg via INTRAVENOUS

## 2020-11-15 MED ORDER — IRBESARTAN 150 MG PO TABS
150.0000 mg | ORAL_TABLET | Freq: Every day | ORAL | Status: DC
  Administered 2020-11-16 – 2020-11-18 (×3): 150 mg via ORAL
  Filled 2020-11-15 (×3): qty 1

## 2020-11-15 MED ORDER — BUPIVACAINE LIPOSOME 1.3 % IJ SUSP
20.0000 mL | Freq: Once | INTRAMUSCULAR | Status: AC
  Administered 2020-11-15: 20 mL
  Filled 2020-11-15: qty 20

## 2020-11-15 MED ORDER — BUPIVACAINE IN DEXTROSE 0.75-8.25 % IT SOLN
INTRATHECAL | Status: DC | PRN
  Administered 2020-11-15: 10 mg via INTRATHECAL

## 2020-11-15 MED ORDER — APIXABAN 2.5 MG PO TABS
2.5000 mg | ORAL_TABLET | Freq: Two times a day (BID) | ORAL | Status: DC
  Administered 2020-11-16 – 2020-11-18 (×5): 2.5 mg via ORAL
  Filled 2020-11-15 (×5): qty 1

## 2020-11-15 SURGICAL SUPPLY — 53 items
BAG COUNTER SPONGE SURGICOUNT (BAG) IMPLANT
BAG SPEC THK2 15X12 ZIP CLS (MISCELLANEOUS) ×1
BAG SPNG CNTER NS LX DISP (BAG)
BAG ZIPLOCK 12X15 (MISCELLANEOUS) ×2 IMPLANT
BLADE SAG 18X100X1.27 (BLADE) ×2 IMPLANT
BLADE SAW SGTL 11.0X1.19X90.0M (BLADE) ×2 IMPLANT
BNDG ELASTIC 6X5.8 VLCR STR LF (GAUZE/BANDAGES/DRESSINGS) ×2 IMPLANT
BOWL SMART MIX CTS (DISPOSABLE) ×2 IMPLANT
CEMENT HV SMART SET (Cement) ×4 IMPLANT
CEMENT TIBIA MBT (Knees) ×1 IMPLANT
COVER SURGICAL LIGHT HANDLE (MISCELLANEOUS) ×2 IMPLANT
CUFF TOURN SGL QUICK 34 (TOURNIQUET CUFF) ×2
CUFF TRNQT CYL 34X4.125X (TOURNIQUET CUFF) ×1 IMPLANT
DECANTER SPIKE VIAL GLASS SM (MISCELLANEOUS) ×2 IMPLANT
DRAPE U-SHAPE 47X51 STRL (DRAPES) ×2 IMPLANT
DRSG AQUACEL AG ADV 3.5X10 (GAUZE/BANDAGES/DRESSINGS) ×2 IMPLANT
DURAPREP 26ML APPLICATOR (WOUND CARE) ×2 IMPLANT
ELECT REM PT RETURN 15FT ADLT (MISCELLANEOUS) ×2 IMPLANT
FEMUR SIGMA PS SZ 2.5 R (Femur) ×2 IMPLANT
GLOVE SRG 8 PF TXTR STRL LF DI (GLOVE) ×1 IMPLANT
GLOVE SURG ENC MOIS LTX SZ6.5 (GLOVE) ×2 IMPLANT
GLOVE SURG ENC MOIS LTX SZ8 (GLOVE) ×4 IMPLANT
GLOVE SURG UNDER POLY LF SZ7 (GLOVE) ×2 IMPLANT
GLOVE SURG UNDER POLY LF SZ8 (GLOVE) ×2
GLOVE SURG UNDER POLY LF SZ8.5 (GLOVE) ×2 IMPLANT
GOWN STRL REUS W/TWL LRG LVL3 (GOWN DISPOSABLE) ×4 IMPLANT
GOWN STRL REUS W/TWL XL LVL3 (GOWN DISPOSABLE) ×2 IMPLANT
HANDPIECE INTERPULSE COAX TIP (DISPOSABLE) ×2
HOLDER FOLEY CATH W/STRAP (MISCELLANEOUS) IMPLANT
IMMOBILIZER KNEE 20 (SOFTGOODS) ×2
IMMOBILIZER KNEE 20 THIGH 36 (SOFTGOODS) ×1 IMPLANT
KIT TURNOVER KIT A (KITS) ×2 IMPLANT
MANIFOLD NEPTUNE II (INSTRUMENTS) ×2 IMPLANT
NS IRRIG 1000ML POUR BTL (IV SOLUTION) ×2 IMPLANT
PACK TOTAL KNEE CUSTOM (KITS) ×2 IMPLANT
PADDING CAST COTTON 6X4 STRL (CAST SUPPLIES) ×2 IMPLANT
PATELLA DOME PFC 35MM (Knees) ×2 IMPLANT
PENCIL SMOKE EVACUATOR (MISCELLANEOUS) ×2 IMPLANT
PIN STEINMAN FIXATION KNEE (PIN) ×2 IMPLANT
PLATE ROT INSERT 10MM SIZE 2.5 (Plate) ×2 IMPLANT
PROTECTOR NERVE ULNAR (MISCELLANEOUS) ×2 IMPLANT
SET HNDPC FAN SPRY TIP SCT (DISPOSABLE) ×1 IMPLANT
STRIP CLOSURE SKIN 1/2X4 (GAUZE/BANDAGES/DRESSINGS) ×2 IMPLANT
SUT MNCRL AB 4-0 PS2 18 (SUTURE) ×2 IMPLANT
SUT STRATAFIX 0 PDS 27 VIOLET (SUTURE) ×2
SUT VIC AB 2-0 CT1 27 (SUTURE) ×6
SUT VIC AB 2-0 CT1 TAPERPNT 27 (SUTURE) ×3 IMPLANT
SUTURE STRATFX 0 PDS 27 VIOLET (SUTURE) ×1 IMPLANT
TIBIA MBT CEMENT (Knees) ×2 IMPLANT
TRAY FOLEY MTR SLVR 16FR STAT (SET/KITS/TRAYS/PACK) ×2 IMPLANT
TUBE SUCTION HIGH CAP CLEAR NV (SUCTIONS) ×2 IMPLANT
WATER STERILE IRR 1000ML POUR (IV SOLUTION) ×4 IMPLANT
WRAP KNEE MAXI GEL POST OP (GAUZE/BANDAGES/DRESSINGS) ×2 IMPLANT

## 2020-11-15 NOTE — Transfer of Care (Signed)
Immediate Anesthesia Transfer of Care Note  Patient: [name]  Procedure(s) Performed: Procedure(s) with comments: TOTAL KNEE ARTHROPLASTY (Right) - 31mn  Patient Location: PACU  Anesthesia Type:Spinal  Level of Consciousness: awake, alert  and oriented  Airway & Oxygen Therapy: Patient Spontanous Breathing  Post-op Assessment: Report given to RN and Post -op Vital signs reviewed and stable  Post vital signs: Reviewed and stable  Last Vitals:  Vitals:   11/15/20 1200 11/15/20 1230  BP: (!) 125/55 (!) 126/57  Pulse: 80 69  Resp: 19 16  Temp:    SpO2: [id number]   Complications: No apparent anesthesia complications

## 2020-11-15 NOTE — Interval H&P Note (Signed)
History and Physical Interval Note:  11/15/2020 10:43 AM  [name]  has presented today for surgery, with the diagnosis of right knee osteoarthritis.  The various methods of treatment have been discussed with the patient and family. After consideration of risks, benefits and other options for treatment, the patient has consented to  Procedure(s) with comments: TOTAL KNEE ARTHROPLASTY (Right) - 6mn as a surgical intervention.  The patient's history has been reviewed, patient examined, no change in status, stable for surgery.  I have reviewed the patient's chart and labs.  Questions were answered to the patient's satisfaction.     FPilar PlateAluisio

## 2020-11-15 NOTE — Anesthesia Postprocedure Evaluation (Signed)
Anesthesia Post Note  Patient: Rise Paganini A Even  Procedure(s) Performed: TOTAL KNEE ARTHROPLASTY (Right: Knee)     Patient location during evaluation: PACU Anesthesia Type: Spinal Level of consciousness: awake and alert, patient cooperative and oriented Pain management: pain level controlled Vital Signs Assessment: post-procedure vital signs reviewed and stable Respiratory status: spontaneous breathing, nonlabored ventilation and respiratory function stable Cardiovascular status: blood pressure returned to baseline and stable Postop Assessment: no apparent nausea or vomiting, patient able to bend at knees and spinal receding Anesthetic complications: no   No notable events documented.  Last Vitals:  Vitals:   11/15/20 1600 11/15/20 1615  BP:  (!) 116/51  Pulse: 71 70  Resp: 20 16  Temp: 36.6 C 36.7 C  SpO2: 99% 98%    Last Pain:  Vitals:   11/15/20 1615  TempSrc: Oral  PainSc:                  Wolf Boulay,E. Nicco Reaume

## 2020-11-15 NOTE — Op Note (Signed)
OPERATIVE REPORT-TOTAL KNEE ARTHROPLASTY   Pre-operative diagnosis- Osteoarthritis  Right knee(s)  Post-operative diagnosis- Osteoarthritis Right knee(s)  Procedure-  Right  Total Knee Arthroplasty  Surgeon- Dione Plover. Sharona Rovner, MD  Assistant- Theresa Duty, PA-C   Anesthesia-   Adductor canal block and spinal  EBL-25 ml   Drains None  Tourniquet time-  Total Tourniquet Time Documented: Thigh (Right) - 35 minutes Total: Thigh (Right) - 35 minutes     Complications- None  Condition-PACU - hemodynamically stable.   Brief Clinical Note  [name] is a 87 y.o. year old female with end stage OA of her right knee with progressively worsening pain and dysfunction. She has constant pain, with activity and at rest and significant functional deficits with difficulties even with ADLs. She has had extensive non-op management including analgesics, injections of cortisone and viscosupplements, and home exercise program, but remains in significant pain with significant dysfunction.Radiographs show bone on bone arthritis all 3 compartments. She presents now for right Total Knee Arthroplasty.     Procedure in detail---   The patient is brought into the operating room and positioned supine on the operating table. After successful administration of  Adductor canal block and spinal,   a tourniquet is placed high on the  Right thigh(s) and the lower extremity is prepped and draped in the usual sterile fashion. Time out is performed by the operating team and then the  Right lower extremity is wrapped in Esmarch, knee flexed and the tourniquet inflated to 300 mmHg.       A midline incision is made with a ten blade through the subcutaneous tissue to the level of the extensor mechanism. A fresh blade is used to make a medial parapatellar arthrotomy. Soft tissue over the proximal medial tibia is subperiosteally elevated to the joint line with a knife and into the semimembranosus bursa with a Cobb  elevator. Soft tissue over the proximal lateral tibia is elevated with attention being paid to avoiding the patellar tendon on the tibial tubercle. The patella is everted, knee flexed 90 degrees and the ACL and PCL are removed. Findings are bone on bone all 3 compartments with large osteophytes.        The drill is used to create a starting hole in the distal femur and the canal is thoroughly irrigated with sterile saline to remove the fatty contents. The 5 degree Right  valgus alignment guide is placed into the femoral canal and the distal femoral cutting block is pinned to remove 10 mm off the distal femur. Resection is made with an oscillating saw.      The tibia is subluxed forward and the menisci are removed. The extramedullary alignment guide is placed referencing proximally at the medial aspect of the tibial tubercle and distally along the second metatarsal axis and tibial crest. The block is pinned to remove 80m off the more deficient medial  side. Resection is made with an oscillating saw. Size 3 is the most appropriate size for the tibia and the proximal tibia is prepared with the modular drill and keel punch for that size.      The femoral sizing guide is placed and size 2.5 is most appropriate. Rotation is marked off the epicondylar axis and confirmed by creating a rectangular flexion gap at 90 degrees. The size 3 cutting block is pinned in this rotation and the anterior, posterior and chamfer cuts are made with the oscillating saw. The intercondylar block is then placed and that cut is made.  Trial size 3 tibial component, trial size 2.5 posterior stabilized femur and a 10  mm posterior stabilized rotating platform insert trial is placed. Full extension is achieved with excellent varus/valgus and anterior/posterior balance throughout full range of motion. The patella is everted and thickness measured to be 22  mm. Free hand resection is taken to 12 mm, a 35 template is placed, lug holes are  drilled, trial patella is placed, and it tracks normally. Osteophytes are removed off the posterior femur with the trial in place. All trials are removed and the cut bone surfaces prepared with pulsatile lavage. Cement is mixed and once ready for implantation, the size 3 tibial implant, size  2.5 posterior stabilized femoral component, and the size 35 patella are cemented in place and the patella is held with the clamp. The trial insert is placed and the knee held in full extension. The Exparel (20 ml mixed with 60 ml saline) is injected into the extensor mechanism, posterior capsule, medial and lateral gutters and subcutaneous tissues.  All extruded cement is removed and once the cement is hard the permanent 10 mm posterior stabilized rotating platform insert is placed into the tibial tray.      The wound is copiously irrigated with saline solution and the extensor mechanism closed with # 0 Stratofix suture. The tourniquet is released for a total tourniquet time of 35  minutes. Flexion against gravity is 140 degrees and the patella tracks normally. Subcutaneous tissue is closed with 2.0 vicryl and subcuticular with running 4.0 Monocryl. The incision is cleaned and dried and steri-strips and a bulky sterile dressing are applied. The limb is placed into a knee immobilizer and the patient is awakened and transported to recovery in stable condition.      Please note that a surgical assistant was a medical necessity for this procedure in order to perform it in a safe and expeditious manner. Surgical assistant was necessary to retract the ligaments and vital neurovascular structures to prevent injury to them and also necessary for proper positioning of the limb to allow for anatomic placement of the prosthesis.   Dione Plover Adi Doro, MD    11/15/2020, 2:08 PM

## 2020-11-15 NOTE — Progress Notes (Signed)
AssistedDr. Carswell Jackson with right, ultrasound guided, adductor canal block. Side rails up, monitors on throughout procedure. See vital signs in flow sheet. Tolerated Procedure well.  

## 2020-11-15 NOTE — Progress Notes (Signed)
Orthopedic Tech Progress Note Patient Details:  [name] Nov 30, 1929 [id number]  CPM Right Knee CPM Right Knee: On Right Knee Flexion (Degrees): 40 Right Knee Extension (Degrees): 10  Post Interventions Patient Tolerated: Well Instructions Provided: Care of device, Adjustment of device  [place] Pink 11/15/2020, 2:48 PM

## 2020-11-15 NOTE — Anesthesia Procedure Notes (Signed)
Anesthesia Regional Block: Adductor canal block   Pre-Anesthetic Checklist: , timeout performed,  Correct Patient, Correct Site, Correct Laterality,  Correct Procedure, Correct Position, site marked,  Risks and benefits discussed,  Surgical consent,  Pre-op evaluation,  At surgeon's request and post-op pain management  Laterality: Right and Lower  Prep: chloraprep       Needles:  Injection technique: Single-shot  Needle Type: Echogenic Needle     Needle Length: 9cm  Needle Gauge: 21     Additional Needles:   Procedures:,,,, ultrasound used (permanent image in chart),,    Narrative:  Start time: 11/15/2020 11:04 AM End time: 11/15/2020 11:10 AM Injection made incrementally with aspirations every 5 mL.  Performed by: Personally  Anesthesiologist: Annye Asa, MD  Additional Notes: Pt identified in Holding room.  Monitors applied. Working IV access confirmed. Sterile prep R thigh.  #21ga ECHOgenic Arrow block needle into adductor canal with US guidance.  20cc 0.75% Ropivacaine injected incrementally after negative test dose.  Patient asymptomatic, VSS, no heme aspirated, tolerated well.   Jenita Seashore, MD

## 2020-11-15 NOTE — Anesthesia Procedure Notes (Signed)
Spinal  End time: 11/15/2020 12:57 PM Reason for block: surgical anesthesia Staffing Performed: anesthesiologist  Anesthesiologist: Annye Asa, MD Resident/CRNA: Gerald Leitz, CRNA Preanesthetic Checklist Completed: patient identified, IV checked, site marked, risks and benefits discussed, surgical consent, monitors and equipment checked, pre-op evaluation and timeout performed Spinal Block Patient position: sitting Prep: DuraPrep and site prepped and draped Patient monitoring: blood pressure, continuous pulse ox, cardiac monitor and heart rate Approach: midline Location: L3-4 Injection technique: single-shot Needle Needle type: Pencan and Introducer  Needle gauge: 24 G Needle length: 9 cm Assessment Events: CSF return and second provider Additional Notes Pt identified in Operating room.  Monitors applied. Working IV access confirmed. Sterile prep, drape lumbar spine.  1% lido local L 3,4.  #24ga Pencan, all attempts os (CRNA), repeat local and #24ga Pencan into clear CSF L 3,4 Glennon Mac).  '10mg'$  0.75% Bupivacaine with dextrose injected with asp CSF beginning and end of injection.  Patient asymptomatic, VSS, no heme aspirated, tolerated well.  Jenita Seashore, MD

## 2020-11-15 NOTE — Progress Notes (Signed)
Pt on CPAP auto titrate max 20 min 5 with 2.5lpm cann bleed in with pt home nasal mask hospital machine and tubing. Machine plugged into red outline spo2 98% pt tolerating well

## 2020-11-15 NOTE — Discharge Instructions (Addendum)
Gaynelle Arabian, MD Total Joint Specialist [facility] [place] [address] [place], [place] [address] [phone]  TOTAL KNEE REPLACEMENT POSTOPERATIVE DIRECTIONS    Knee Rehabilitation, Guidelines Following Surgery  Results after knee surgery are often greatly improved when you follow the exercise, range of motion and muscle strengthening exercises prescribed by your doctor. Safety measures are also important to protect the knee from further injury. If any of these exercises cause you to have increased pain or swelling in your knee joint, decrease the amount until you are comfortable again and slowly increase them. If you have problems or questions, call your caregiver or physical therapist for advice.   BLOOD CLOT PREVENTION Take a 2.5 mg Eliquis twice a day for three weeks following surgery. Then resume one 81 mg aspirin once a day. You may resume your vitamins/supplements once you have discontinued the Xarelto. Do not take any NSAIDs (Advil, Aleve, Ibuprofen, Meloxicam, etc.) until you have discontinued the Xarelto.   HOME CARE INSTRUCTIONS  Remove items at home which could result in a fall. This includes throw rugs or furniture in walking pathways.  ICE to the affected knee as much as tolerated. Icing helps control swelling. If the swelling is well controlled you will be more comfortable and rehab easier. Continue to use ice on the knee for pain and swelling from surgery. You may notice swelling that will progress down to the foot and ankle. This is normal after surgery. Elevate the leg when you are not up walking on it.    Continue to use the breathing machine which will help keep your temperature down. It is common for your temperature to cycle up and down following surgery, especially at night when you are not up moving around and exerting yourself. The breathing machine keeps your lungs expanded and your temperature down. Do not place pillow under the  operative knee, focus on keeping the knee straight while resting  DIET You may resume your previous home diet once you are discharged from the hospital.  DRESSING / WOUND CARE / SHOWERING Keep your bulky bandage on for 2 days. On the third post-operative day you may remove the Ace bandage and gauze. There is a waterproof adhesive bandage on your skin which will stay in place until your first follow-up appointment. Once you remove this you will not need to place another bandage You may begin showering 3 days following surgery, but do not submerge the incision under water.  ACTIVITY For the first 5 days, the key is rest and control of pain and swelling Do your home exercises twice a day starting on post-operative day 3. On the days you go to physical therapy, just do the home exercises once that day. You should rest, ice and elevate the leg for 50 minutes out of every hour. Get up and walk/stretch for 10 minutes per hour. After 5 days you can increase your activity slowly as tolerated. Walk with your walker as instructed. Use the walker until you are comfortable transitioning to a cane. Walk with the cane in the opposite hand of the operative leg. You may discontinue the cane once you are comfortable and walking steadily. Avoid periods of inactivity such as sitting longer than an hour when not asleep. This helps prevent blood clots.  You may discontinue the knee immobilizer once you are able to perform a straight leg raise while lying down. You may resume a sexual relationship in one month or when given the OK by your  doctor.  You may return to work once you are cleared by your doctor.  Do not drive a car for 6 weeks or until released by your surgeon.  Do not drive while taking narcotics.  TED HOSE STOCKINGS Wear the elastic stockings on both legs for three weeks following surgery during the day. You may remove them at night for sleeping.  WEIGHT BEARING Weight bearing as tolerated with assist  device (walker, cane, etc) as directed, use it as long as suggested by your surgeon or therapist, typically at least 4-6 weeks.  POSTOPERATIVE CONSTIPATION PROTOCOL Constipation - defined medically as fewer than three stools per week and severe constipation as less than one stool per week.  One of the most common issues patients have following surgery is constipation.  Even if you have a regular bowel pattern at home, your normal regimen is likely to be disrupted due to multiple reasons following surgery.  Combination of anesthesia, postoperative narcotics, change in appetite and fluid intake all can affect your bowels.  In order to avoid complications following surgery, here are some recommendations in order to help you during your recovery period.  Colace (docusate) - Pick up an over-the-counter form of Colace or another stool softener and take twice a day as long as you are requiring postoperative pain medications.  Take with a full glass of water daily.  If you experience loose stools or diarrhea, hold the colace until you stool forms back up. If your symptoms do not get better within 1 week or if they get worse, check with your doctor. Dulcolax (bisacodyl) - Pick up over-the-counter and take as directed by the product packaging as needed to assist with the movement of your bowels.  Take with a full glass of water.  Use this product as needed if not relieved by Colace only.  MiraLax (polyethylene glycol) - Pick up over-the-counter to have on hand. MiraLax is a solution that will increase the amount of water in your bowels to assist with bowel movements.  Take as directed and can mix with a glass of water, juice, soda, coffee, or tea. Take if you go more than two days without a movement. Do not use MiraLax more than once per day. Call your doctor if you are still constipated or irregular after using this medication for 7 days in a row.  If you continue to have problems with postoperative constipation,  please contact the office for further assistance and recommendations.  If you experience "the worst abdominal pain ever" or develop nausea or vomiting, please contact the office immediatly for further recommendations for treatment.  ITCHING If you experience itching with your medications, try taking only a single pain pill, or even half a pain pill at a time.  You can also use Benadryl over the counter for itching or also to help with sleep.   MEDICATIONS See your medication summary on the "After Visit Summary" that the nursing staff will review with you prior to discharge.  You may have some home medications which will be placed on hold until you complete the course of blood thinner medication.  It is important for you to complete the blood thinner medication as prescribed by your surgeon.  Continue your approved medications as instructed at time of discharge.  PRECAUTIONS If you experience chest pain or shortness of breath - call 911 immediately for transfer to the hospital emergency department.  If you develop a fever greater that 101 F, purulent drainage from wound, increased redness or  drainage from wound, foul odor from the wound/dressing, or calf pain - CONTACT YOUR SURGEON.                                                   FOLLOW-UP APPOINTMENTS Make sure you keep all of your appointments after your operation with your surgeon and caregivers. You should call the office at the above phone number and make an appointment for approximately two weeks after the date of your surgery or on the date instructed by your surgeon outlined in the "After Visit Summary".  RANGE OF MOTION AND STRENGTHENING EXERCISES  Rehabilitation of the knee is important following a knee injury or an operation. After just a few days of immobilization, the muscles of the thigh which control the knee become weakened and shrink (atrophy). Knee exercises are designed to build up the tone and strength of the thigh muscles and to  improve knee motion. Often times heat used for twenty to thirty minutes before working out will loosen up your tissues and help with improving the range of motion but do not use heat for the first two weeks following surgery. These exercises can be done on a training (exercise) mat, on the floor, on a table or on a bed. Use what ever works the best and is most comfortable for you Knee exercises include:  Leg Lifts - While your knee is still immobilized in a splint or cast, you can do straight leg raises. Lift the leg to 60 degrees, hold for 3 sec, and slowly lower the leg. Repeat 10-20 times 2-3 times daily. Perform this exercise against resistance later as your knee gets better.  Quad and Hamstring Sets - Tighten up the muscle on the front of the thigh (Quad) and hold for 5-10 sec. Repeat this 10-20 times hourly. Hamstring sets are done by pushing the foot backward against an object and holding for 5-10 sec. Repeat as with quad sets.  Leg Slides: Lying on your back, slowly slide your foot toward your buttocks, bending your knee up off the floor (only go as far as is comfortable). Then slowly slide your foot back down until your leg is flat on the floor again. Angel Wings: Lying on your back spread your legs to the side as far apart as you can without causing discomfort.  A rehabilitation program following serious knee injuries can speed recovery and prevent re-injury in the future due to weakened muscles. Contact your doctor or a physical therapist for more information on knee rehabilitation.   POST-OPERATIVE OPIOID TAPER INSTRUCTIONS: It is important to wean off of your opioid medication as soon as possible. If you do not need pain medication after your surgery it is ok to stop day one. Opioids include: Codeine, Hydrocodone(Norco, Vicodin), Oxycodone(Percocet, oxycontin) and hydromorphone amongst others.  Long term and even short term use of opiods can cause: Increased pain  response Dependence Constipation Depression Respiratory depression And more.  Withdrawal symptoms can include Flu like symptoms Nausea, vomiting And more Techniques to manage these symptoms Hydrate well Eat regular healthy meals Stay active Use relaxation techniques(deep breathing, meditating, yoga) Do Not substitute Alcohol to help with tapering If you have been on opioids for less than two weeks and do not have pain than it is ok to stop all together.  Plan to wean off of opioids This plan  should start within one week post op of your joint replacement. Maintain the same interval or time between taking each dose and first decrease the dose.  Cut the total daily intake of opioids by one tablet each day Next start to increase the time between doses. The last dose that should be eliminated is the evening dose.   IF YOU ARE TRANSFERRED TO A SKILLED REHAB FACILITY If the patient is transferred to a skilled rehab facility following release from the hospital, a list of the current medications will be sent to the facility for the patient to continue.  When discharged from the skilled rehab facility, please have the facility set up the patient's [place] prior to being released. Also, the skilled facility will be responsible for providing the patient with their medications at time of release from the facility to include their pain medication, the muscle relaxants, and their blood thinner medication. If the patient is still at the rehab facility at time of the two week follow up appointment, the skilled rehab facility will also need to assist the patient in arranging follow up appointment in our office and any transportation needs.  MAKE SURE YOU:  Understand these instructions.  Get help right away if you are not doing well or get worse.   DENTAL ANTIBIOTICS:  In most cases prophylactic antibiotics for Dental procdeures after total joint surgery are not  necessary.  Exceptions are as follows:  1. History of prior total joint infection  2. Severely immunocompromised (Organ Transplant, cancer chemotherapy, Rheumatoid biologic meds such as [place])  3. Poorly controlled diabetes (A1C &gt; 8.0, blood glucose over 200)  If you have one of these conditions, contact your surgeon for an antibiotic prescription, prior to your dental procedure.    Pick up stool softner and laxative for home use following surgery while on pain medications. Do not submerge incision under water. Please use good hand washing techniques while changing dressing each day. May shower starting three days after surgery. Please use a clean towel to pat the incision dry following showers. Continue to use ice for pain and swelling after surgery.  Information on my medicine - ELIQUIS (apixaban) Why was Eliquis prescribed for you? Eliquis was prescribed for you to reduce the risk of blood clots forming after orthopedic surgery.    What do You need to know about Eliquis? Take your Eliquis TWICE DAILY - one tablet in the morning and one tablet in the evening with or without food.  It would be best to take the dose about the same time each day.  If you have difficulty swallowing the tablet whole please discuss with your pharmacist how to take the medication safely.  Take Eliquis exactly as prescribed by your doctor and DO NOT stop taking Eliquis without talking to the doctor who prescribed the medication.  Stopping without other medication to take the place of Eliquis may increase your risk of developing a clot.  After discharge, you should have regular check-up appointments with your healthcare provider that is prescribing your Eliquis.  What do you do if you miss a dose? If a dose of ELIQUIS is not taken at the scheduled time, take it as soon as possible on the same day and twice-daily administration should be resumed.  The dose should not be doubled to make up  for a missed dose.  Do not take more than one tablet of ELIQUIS at the same time.  Important Safety Information A possible side effect of  Eliquis is bleeding. You should call your healthcare provider right away if you experience any of the following: Bleeding from an injury or your nose that does not stop. Unusual colored urine (red or dark brown) or unusual colored stools (red or black). Unusual bruising for unknown reasons. A serious fall or if you hit your head (even if there is no bleeding).  Some medicines may interact with Eliquis and might increase your risk of bleeding or clotting while on Eliquis. To help avoid this, consult your healthcare provider or pharmacist prior to using any new prescription or non-prescription medications, including herbals, vitamins, non-steroidal anti-inflammatory drugs (NSAIDs) and supplements.  This website has more information on Eliquis (apixaban): http://www.eliquis.com/eliquis/home  Do not use any lotions or creams on the incision until instructed by your surgeon.

## 2020-11-16 ENCOUNTER — Encounter (HOSPITAL_COMMUNITY): Payer: Self-pay | Admitting: Orthopedic Surgery

## 2020-11-16 DIAGNOSIS — M1711 Unilateral primary osteoarthritis, right knee: Secondary | ICD-10-CM | POA: Diagnosis not present

## 2020-11-16 LAB — CBC
HCT: 31.8 % — ABNORMAL LOW (ref 36.0–46.0)
Hemoglobin: 9.8 g/dL — ABNORMAL LOW (ref 12.0–15.0)
MCH: 30.4 pg (ref 26.0–34.0)
MCHC: 30.8 g/dL (ref 30.0–36.0)
MCV: 98.8 fL (ref 80.0–100.0)
Platelets: 145 10*3/uL — ABNORMAL LOW (ref 150–400)
RBC: 3.22 MIL/uL — ABNORMAL LOW (ref 3.87–5.11)
RDW: 14.1 % (ref 11.5–15.5)
WBC: 7.4 10*3/uL (ref 4.0–10.5)
nRBC: 0 % (ref 0.0–0.2)

## 2020-11-16 LAB — BASIC METABOLIC PANEL
Anion gap: 8 (ref 5–15)
BUN: 25 mg/dL — ABNORMAL HIGH (ref 8–23)
CO2: 25 mmol/L (ref 22–32)
Calcium: 8.8 mg/dL — ABNORMAL LOW (ref 8.9–10.3)
Chloride: 109 mmol/L (ref 98–111)
Creatinine, Ser: 0.87 mg/dL (ref 0.44–1.00)
GFR, Estimated: >60 mL/min (ref >60)
Glucose, Bld: 200 mg/dL — ABNORMAL HIGH (ref 70–99)
Potassium: 4.8 mmol/L (ref 3.5–5.1)
Sodium: 142 mmol/L (ref 135–145)

## 2020-11-16 MED ORDER — SODIUM CHLORIDE 0.9 % IV BOLUS
250.0000 mL | Freq: Once | INTRAVENOUS | Status: AC
  Administered 2020-11-16: 250 mL via INTRAVENOUS

## 2020-11-16 MED ORDER — SODIUM CHLORIDE 0.9 % IV SOLN: INTRAVENOUS | Status: DC
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
## 2020-11-16 NOTE — Plan of Care (Signed)
  Problem: Activity: Goal: Ability to avoid complications of mobility impairment will improve Outcome: Progressing Goal: Range of joint motion will improve Outcome: Progressing   Problem: Pain Management: Goal: Pain level will decrease with appropriate interventions Outcome: Progressing   

## 2020-11-16 NOTE — Plan of Care (Signed)
  Problem: Clinical Measurements: Goal: Postoperative complications will be avoided or minimized Outcome: Progressing   Problem: Pain Management: Goal: Pain level will decrease with appropriate interventions Outcome: Progressing   

## 2020-11-16 NOTE — Progress Notes (Signed)
Subjective: 1 Day Post-Op Procedure(s) (LRB): TOTAL KNEE ARTHROPLASTY (Right) Patient reports pain as mild.   Patient seen in rounds by Dr. Wynelle Link. Patient is well, and has had no acute complaints or problems. Pain currently controlled, due to patient's age and sensitivity to codeine, only tramadol and low dose Norco has been ordered. Will continue to monitor. Denies chest pain, SOB, or calf pain. No issues overnight. Foley catheter removed this AM. We will begin therapy today.   Objective: Vital signs in last 24 hours: Temp:  [96.5 F (35.8 C)-98 F (36.7 C)] 97.5 F (36.4 C) (07/26 0601) Pulse Rate:  [53-96] 53 (07/26 0601) Resp:  [13-20] 16 (07/26 0601) BP: (108-146)/(43-68) 129/50 (07/26 0618) SpO2:  [96 %-100 %] 97 % (07/26 0601) Weight:  [56.2 kg] 56.2 kg (07/25 1700)  Intake/Output from previous day:  Intake/Output Summary (Last 24 hours) at 11/16/2020 0740 Last data filed at 11/16/2020 0601 Gross per 24 hour  Intake 3426.89 ml  Output 1680 ml  Net 1746.89 ml     Intake/Output this shift: No intake/output data recorded.  Labs: Recent Labs    11/16/20 0337  HGB 9.8*   Recent Labs    11/16/20 0337  WBC 7.4  RBC 3.22*  HCT 31.8*  PLT 145*   Recent Labs    11/16/20 0337  NA 142  K 4.8  CL 109  CO2 25  BUN 25*  CREATININE 0.87  GLUCOSE 200*  CALCIUM 8.8*   No results for input(s): LABPT, INR in the last 72 hours.  Exam: General - Patient is Alert and Oriented Extremity - Neurologically intact Neurovascular intact Sensation intact distally Dorsiflexion/Plantar flexion intact Dressing - dressing C/D/I Motor Function - intact, moving foot and toes well on exam.   Past Medical History:  Diagnosis Date   Abdominal pain, other specified site    Allergic rhinitis    Anemia    Arthritis    CAD (coronary artery disease) 12/2017   s/p stent   Chronic kidney disease    stage 3 kidney disease per patient on [id number]   Diastolic CHF ([place]) [id number]    grade 1   Essential hypertension, benign 06/17/2018   History of non-ST elevation myocardial infarction (NSTEMI) 06/17/2018   HLD (hyperlipidemia)    Hypertension    Irregular heart rate    Ischemic cardiomyopathy 06/17/2018   Myocardial infarction ([place]) 12/[facility]    [facility]   OSA (obstructive sleep apnea)    uses adaptive servo ventilation machine at night   Pneumonia    Seizure disorder ([place])    last seizure in the 1950s-well controlled with keppra   Skin cancer    left arm    Assessment/Plan: 1 Day Post-Op Procedure(s) (LRB): TOTAL KNEE ARTHROPLASTY (Right) Principal Problem:   OA (osteoarthritis) of knee Active Problems:   Primary osteoarthritis of right knee  Estimated body mass index is 27.78 kg/m as calculated from the following:   Height as of this encounter: '4\' 8"'$  (1.422 m).   Weight as of this encounter: 56.2 kg. Advance diet Up with therapy  Anticipated LOS equal to or greater than 2 midnights due to - Age 85 and older with one or more of the following:  - Obesity  - Expected need for hospital services (PT, OT, Nursing) required for safe  discharge  - Anticipated need for postoperative skilled nursing care or inpatient rehab  - Active co-morbidities: Coronary Artery Disease OR   - Unanticipated findings during/Post Surgery: None  -  Patient is a high risk of re-admission due to: None   DVT Prophylaxis -  Eliquis Weight bearing as tolerated. Begin therapy.  BP soft this AM, 250 mL bolus ordered.  Plan is to go Home after hospital stay. Plan for discharge tomorrow pending progress with physical therapy.  Theresa Duty, PA-C Orthopedic Surgery [phone] 11/16/2020, 7:40 AM

## 2020-11-16 NOTE — Progress Notes (Signed)
   11/16/20 0953  Assess: MEWS Score  BP (!) 100/46  Pulse Rate (!) 50  Resp 18  SpO2 96 %  Assess: MEWS Score  MEWS Temp 0  MEWS Systolic 1  MEWS Pulse 1  MEWS RR 0  MEWS LOC 0  MEWS Score 2  MEWS Score Color Yellow  Assess: if the MEWS score is Yellow or Red  Were vital signs taken at a resting state? Yes  Focused Assessment No change from prior assessment  Does the patient meet 2 or more of the SIRS criteria? Yes  Does the patient have a confirmed or suspected source of infection? No  MEWS guidelines implemented *See Row Information* Yes  Treat  MEWS Interventions Administered scheduled meds/treatments  Pain Scale 0-10  Pain Score 0  Take Vital Signs  Increase Vital Sign Frequency  Yellow: Q 2hr X 2 then Q 4hr X 2, if remains yellow, continue Q 4hrs  Escalate  MEWS: Escalate Yellow: discuss with charge nurse/RN and consider discussing with provider and RRT  Notify: Charge Nurse/RN  Name of Charge Nurse/RN Notified Thereasa Distance  Date Charge Nurse/RN Notified 11/16/20  Time Charge Nurse/RN Notified 1000  Notify: Provider  Provider Name/Title Aluisio  Date Provider Notified 11/16/20  Time Provider Notified  (MD aware and new order noted)  Notification Reason Other (Comment) (pt made round and BP low)  Provider response At bedside  Document  Patient Outcome Not stable and remains on department  Assess: SIRS CRITERIA  SIRS Temperature  0  SIRS Pulse 0  SIRS Respirations  0  SIRS WBC 0  SIRS Score Sum  0  Patient is alert and oriented X4. No pain or discomfort. BP and pulse abnormal. No change in condition.  Will continue to monitor.
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
## 2020-11-16 NOTE — TOC Transition Note (Signed)
Transition of Care [facility]) - CM/SW Discharge Note   Patient Details  Name: [name] MRN: [id number] Date of Birth: [birth year]  Transition of Care [facility]) CM/SW Contact:  Lennart Pall, LCSW Phone Number: 11/16/2020, 11:10 AM   Clinical Narrative:    Met briefly with pt and confirming she has all needed DME at home.  Plan for OPPT at [facility]).  No further TOC needs.   Final next level of care: OP Rehab Barriers to Discharge: No Barriers Identified   Patient Goals and CMS Choice Patient states their goals for this hospitalization and ongoing recovery are:: return home      Discharge Placement                       Discharge Plan and Services                DME Arranged: N/A DME Agency: NA                  Social Determinants of Health (SDOH) Interventions     Readmission Risk Interventions Readmission Risk Prevention Plan 12/23/2019  Transportation Screening Complete  PCP or Specialist Appt within 3-5 Days Not Complete  Not Complete comments DC to SNF  [place] or [place] Not Complete  HRI or Home Care Consult comments Pt dc to SNF  Social Work Consult for [place] Planning/Counseling Not Complete  SW consult not completed comments DC to SNF  Palliative Care Screening Not Applicable  Medication Review (RN Care Manager) Complete  Some recent data might be hidden

## 2020-11-17 DIAGNOSIS — M1711 Unilateral primary osteoarthritis, right knee: Secondary | ICD-10-CM | POA: Diagnosis not present

## 2020-11-17 LAB — BASIC METABOLIC PANEL
Anion gap: 6 (ref 5–15)
BUN: 25 mg/dL — ABNORMAL HIGH (ref 8–23)
CO2: 24 mmol/L (ref 22–32)
Calcium: 8.8 mg/dL — ABNORMAL LOW (ref 8.9–10.3)
Chloride: 112 mmol/L — ABNORMAL HIGH (ref 98–111)
Creatinine, Ser: 0.64 mg/dL (ref 0.44–1.00)
GFR, Estimated: >60 mL/min (ref >60)
Glucose, Bld: 109 mg/dL — ABNORMAL HIGH (ref 70–99)
Potassium: 4.5 mmol/L (ref 3.5–5.1)
Sodium: 142 mmol/L (ref 135–145)

## 2020-11-17 LAB — CBC
HCT: 29.6 % — ABNORMAL LOW (ref 36.0–46.0)
Hemoglobin: 9.2 g/dL — ABNORMAL LOW (ref 12.0–15.0)
MCH: 31 pg (ref 26.0–34.0)
MCHC: 31.1 g/dL (ref 30.0–36.0)
MCV: 99.7 fL (ref 80.0–100.0)
Platelets: 148 10*3/uL — ABNORMAL LOW (ref 150–400)
RBC: 2.97 MIL/uL — ABNORMAL LOW (ref 3.87–5.11)
RDW: 14.4 % (ref 11.5–15.5)
WBC: 9.4 10*3/uL (ref 4.0–10.5)
nRBC: 0 % (ref 0.0–0.2)

## 2020-11-17 MED ORDER — APIXABAN 2.5 MG PO TABS: 2.5000 mg | ORAL_TABLET | Freq: Two times a day (BID) | ORAL | 0 refills | Status: DC

## 2020-11-17 MED ORDER — HYDROCODONE-ACETAMINOPHEN 5-325 MG PO TABS: 1.0000 | ORAL_TABLET | Freq: Four times a day (QID) | ORAL | 0 refills | Status: DC | PRN

## 2020-11-17 MED ORDER — METHOCARBAMOL 500 MG PO TABS: 500.0000 mg | ORAL_TABLET | Freq: Four times a day (QID) | ORAL | 0 refills | Status: DC | PRN

## 2020-11-17 NOTE — Progress Notes (Signed)
RN notified PA Drue Dun that patient did not pass PT today and would not be safe to d/c home. Discharge cancelled.

## 2020-11-17 NOTE — Progress Notes (Signed)
Subjective: 2 Days Post-Op Procedure(s) (LRB): TOTAL KNEE ARTHROPLASTY (Right) Patient reports pain as mild.   Patient seen in rounds by Dr. Wynelle Link. Patient is well, and has had no acute complaints or problems. Had soft BP yesterday AM and then again later in the morning. Two separate 250 mL boluses were ordered with improvement. Patient asymptomatic throughout morning. Denies chest pain or SOB. BP in good range this AM. Plan is to go Home after hospital stay.  Objective: Vital signs in last 24 hours: Temp:  [96 F (35.6 C)-98 F (36.7 C)] 98 F (36.7 C) (07/27 0537) Pulse Rate:  [50-61] 52 (07/27 0537) Resp:  [15-18] 16 (07/27 0537) BP: (100-131)/(41-72) 131/72 (07/27 0537) SpO2:  [95 %-100 %] 100 % (07/27 0537)  Intake/Output from previous day:  Intake/Output Summary (Last 24 hours) at 11/17/2020 0724 Last data filed at 11/17/2020 0600 Gross per 24 hour  Intake 1253.99 ml  Output 300 ml  Net 953.99 ml    Intake/Output this shift: No intake/output data recorded.  Labs: Recent Labs    11/16/20 0337 11/17/20 0311  HGB 9.8* 9.2*   Recent Labs    11/16/20 0337 11/17/20 0311  WBC 7.4 9.4  RBC 3.22* 2.97*  HCT 31.8* 29.6*  PLT 145* 148*   Recent Labs    11/16/20 0337 11/17/20 0311  NA 142 142  K 4.8 4.5  CL 109 112*  CO2 25 24  BUN 25* 25*  CREATININE 0.87 0.64  GLUCOSE 200* 109*  CALCIUM 8.8* 8.8*   No results for input(s): LABPT, INR in the last 72 hours.  Exam: General - Patient is Alert and Oriented Extremity - Neurologically intact Neurovascular intact Sensation intact distally Dorsiflexion/Plantar flexion intact Dressing/Incision - clean, dry, no drainage. Bulky dressing removed, aquacel in place. Motor Function - intact, moving foot and toes well on exam.   Past Medical History:  Diagnosis Date   Abdominal pain, other specified site    Allergic rhinitis    Anemia    Arthritis    CAD (coronary artery disease) 12/2017   s/p stent    Chronic kidney disease    stage 3 kidney disease per patient on [id number]   Diastolic CHF ([place]) [id number]   grade 1   Essential hypertension, benign 06/17/2018   History of non-ST elevation myocardial infarction (NSTEMI) 06/17/2018   HLD (hyperlipidemia)    Hypertension    Irregular heart rate    Ischemic cardiomyopathy 06/17/2018   Myocardial infarction ([place]) 12/2017    [facility]   OSA (obstructive sleep apnea)    uses adaptive servo ventilation machine at night   Pneumonia    Seizure disorder ([place])    last seizure in the 1950s-well controlled with keppra   Skin cancer    left arm    Assessment/Plan: 2 Days Post-Op Procedure(s) (LRB): TOTAL KNEE ARTHROPLASTY (Right) Principal Problem:   OA (osteoarthritis) of knee Active Problems:   Primary osteoarthritis of right knee  Estimated body mass index is 27.78 kg/m as calculated from the following:   Height as of this encounter: '4\' 8"'$  (1.422 m).   Weight as of this encounter: 56.2 kg. Up with therapy  DVT Prophylaxis -  Eliquis Weight-bearing as tolerated  Plan for discharge later today if progresses with therapy and safe for home discharge. Scheduled for OPPT Follow-up in the office in 2 weeks  The PDMP database was reviewed today prior to any opioid medications being prescribed to this patient.  Theresa Duty, PA-C Orthopedic  Surgery [phone] 11/17/2020, 7:24 AM
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
## 2020-11-17 NOTE — Plan of Care (Signed)
  Problem: Activity: Goal: Ability to avoid complications of mobility impairment will improve Outcome: Progressing   Problem: Clinical Measurements: Goal: Postoperative complications will be avoided or minimized Outcome: Progressing   Problem: Pain Management: Goal: Pain level will decrease with appropriate interventions Outcome: Progressing   
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
## 2020-11-18 DIAGNOSIS — M1711 Unilateral primary osteoarthritis, right knee: Secondary | ICD-10-CM | POA: Diagnosis not present

## 2020-11-18 LAB — CBC
HCT: 30.3 % — ABNORMAL LOW (ref 36.0–46.0)
Hemoglobin: 9.5 g/dL — ABNORMAL LOW (ref 12.0–15.0)
MCH: 30.9 pg (ref 26.0–34.0)
MCHC: 31.4 g/dL (ref 30.0–36.0)
MCV: 98.7 fL (ref 80.0–100.0)
Platelets: 151 10*3/uL (ref 150–400)
RBC: 3.07 MIL/uL — ABNORMAL LOW (ref 3.87–5.11)
RDW: 14.7 % (ref 11.5–15.5)
WBC: 9.1 10*3/uL (ref 4.0–10.5)
nRBC: 0 % (ref 0.0–0.2)

## 2020-11-18 NOTE — Plan of Care (Signed)
PATIENT DC'D
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
## 2020-11-18 NOTE — Plan of Care (Signed)
  Problem: Education: Goal: Knowledge of the prescribed therapeutic regimen will improve Outcome: Progressing   Problem: Activity: Goal: Ability to avoid complications of mobility impairment will improve Outcome: Progressing   Problem: Pain Management: Goal: Pain level will decrease with appropriate interventions Outcome: Progressing   
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
## 2020-11-18 NOTE — Progress Notes (Signed)
   Subjective: 3 Days Post-Op Procedure(s) (LRB): TOTAL KNEE ARTHROPLASTY (Right) Patient reports pain as mild.   Patient seen in rounds with Dr. Wynelle Link. Patient complains of back pain, most likely from the bed. Denies chest pain or SOB. No issues overnight, voiding without difficulty. Plan is to go Home after hospital stay.  Objective: Vital signs in last 24 hours: Temp:  [97.5 F (36.4 C)-98.8 F (37.1 C)] 97.5 F (36.4 C) (07/28 0521) Pulse Rate:  [54-72] 68 (07/28 0521) Resp:  [15-17] 17 (07/28 0521) BP: (110-157)/(42-54) 157/54 (07/28 0521) SpO2:  [96 %-99 %] 99 % (07/28 0521)  Intake/Output from previous day:  Intake/Output Summary (Last 24 hours) at 11/18/2020 0719 Last data filed at 11/18/2020 0600 Gross per 24 hour  Intake 1010.07 ml  Output 2200 ml  Net -1189.93 ml    Intake/Output this shift: No intake/output data recorded.  Labs: Recent Labs    11/16/20 0337 11/17/20 0311 11/18/20 0331  HGB 9.8* 9.2* 9.5*   Recent Labs    11/17/20 0311 11/18/20 0331  WBC 9.4 9.1  RBC 2.97* 3.07*  HCT 29.6* 30.3*  PLT 148* 151   Recent Labs    11/16/20 0337 11/17/20 0311  NA 142 142  K 4.8 4.5  CL 109 112*  CO2 25 24  BUN 25* 25*  CREATININE 0.87 0.64  GLUCOSE 200* 109*  CALCIUM 8.8* 8.8*   No results for input(s): LABPT, INR in the last 72 hours.  Exam: General - Patient is Alert and Oriented Extremity - Neurologically intact Neurovascular intact Sensation intact distally Dorsiflexion/Plantar flexion intact Dressing/Incision - clean, dry, no drainage Motor Function - intact, moving foot and toes well on exam.   Past Medical History:  Diagnosis Date   Abdominal pain, other specified site    Allergic rhinitis    Anemia    Arthritis    CAD (coronary artery disease) 12/2017   s/p stent   Chronic kidney disease    stage 3 kidney disease per patient on [id number]   Diastolic CHF ([place]) [id number]   grade 1   Essential hypertension, benign 06/17/2018    History of non-ST elevation myocardial infarction (NSTEMI) 06/17/2018   HLD (hyperlipidemia)    Hypertension    Irregular heart rate    Ischemic cardiomyopathy 06/17/2018   Myocardial infarction ([place]) 12/2017    [facility]   OSA (obstructive sleep apnea)    uses adaptive servo ventilation machine at night   Pneumonia    Seizure disorder ([place])    last seizure in the 1950s-well controlled with keppra   Skin cancer    left arm    Assessment/Plan: 3 Days Post-Op Procedure(s) (LRB): TOTAL KNEE ARTHROPLASTY (Right) Principal Problem:   OA (osteoarthritis) of knee Active Problems:   Primary osteoarthritis of right knee  Estimated body mass index is 27.78 kg/m as calculated from the following:   Height as of this encounter: '4\' 8"'$  (1.422 m).   Weight as of this encounter: 56.2 kg. Up with therapy  DVT Prophylaxis -  Eliquis Weight-bearing as tolerated  Plan for discharge later today once cleared by PT  Theresa Duty, PA-C Orthopedic Surgery [phone] 11/18/2020, 7:19 AM

## 2020-11-19 ENCOUNTER — Ambulatory Visit: Payer: Medicare Other | Attending: Student | Admitting: Rehabilitative and Restorative Service Providers"

## 2020-11-19 ENCOUNTER — Other Ambulatory Visit: Payer: Self-pay

## 2020-11-19 ENCOUNTER — Encounter: Payer: Self-pay | Admitting: Rehabilitative and Restorative Service Providers"

## 2020-11-19 DIAGNOSIS — M25561 Pain in right knee: Secondary | ICD-10-CM | POA: Diagnosis present

## 2020-11-19 DIAGNOSIS — M6281 Muscle weakness (generalized): Secondary | ICD-10-CM | POA: Diagnosis present

## 2020-11-19 DIAGNOSIS — M25661 Stiffness of right knee, not elsewhere classified: Secondary | ICD-10-CM

## 2020-11-19 DIAGNOSIS — R262 Difficulty in walking, not elsewhere classified: Secondary | ICD-10-CM | POA: Diagnosis present

## 2020-11-19 DIAGNOSIS — Z96651 Presence of right artificial knee joint: Secondary | ICD-10-CM | POA: Diagnosis present

## 2020-11-19 DIAGNOSIS — R600 Localized edema: Secondary | ICD-10-CM

## 2020-11-19 NOTE — Patient Instructions (Signed)
Access Code: [email or web address] URL: [email or web address] Date: 11/19/2020 Prepared by: Shelby Dubin Chrisie Jankovich  Exercises Supine Quad Set - 2-3 x daily - 7 x weekly - 2 sets - 10 reps Supine Ankle Pumps - 2-3 x daily - 7 x weekly - 2 sets - 10 reps Supine Heel Slide with Strap - 2-3 x daily - 7 x weekly - 2 sets - 10 reps Supine Straight Leg Raises - 2-3 x daily - 7 x weekly - 2 sets - 10 reps Supine Hip Abduction - 2 x daily - 7 x weekly - 2 sets - 10 reps
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
## 2020-11-22 ENCOUNTER — Encounter: Payer: Self-pay | Admitting: Physical Therapy

## 2020-11-22 ENCOUNTER — Ambulatory Visit: Payer: Medicare Other | Attending: Student | Admitting: Physical Therapy

## 2020-11-22 ENCOUNTER — Other Ambulatory Visit: Payer: Self-pay

## 2020-11-22 DIAGNOSIS — M25661 Stiffness of right knee, not elsewhere classified: Secondary | ICD-10-CM | POA: Diagnosis present

## 2020-11-22 DIAGNOSIS — Z96651 Presence of right artificial knee joint: Secondary | ICD-10-CM | POA: Insufficient documentation

## 2020-11-22 DIAGNOSIS — R600 Localized edema: Secondary | ICD-10-CM | POA: Insufficient documentation

## 2020-11-22 DIAGNOSIS — M79671 Pain in right foot: Secondary | ICD-10-CM | POA: Diagnosis present

## 2020-11-22 DIAGNOSIS — R262 Difficulty in walking, not elsewhere classified: Secondary | ICD-10-CM | POA: Diagnosis present

## 2020-11-22 DIAGNOSIS — M25561 Pain in right knee: Secondary | ICD-10-CM | POA: Diagnosis present

## 2020-11-22 DIAGNOSIS — M6281 Muscle weakness (generalized): Secondary | ICD-10-CM | POA: Insufficient documentation

## 2020-11-22 NOTE — Discharge Summary (Signed)
Physician Discharge Summary   Patient ID: [name] MRN: [id number] DOB/AGE: 1930/01/11 87 y.o.  Admit date: 11/15/2020 Discharge date: 11/18/2020  Primary Diagnosis: Osteoarthritis, right knee   Admission Diagnoses:  Past Medical History:  Diagnosis Date   Abdominal pain, other specified site    Allergic rhinitis    Anemia    Arthritis    CAD (coronary artery disease) 12/2017   s/p stent   Chronic kidney disease    stage 3 kidney disease per patient on [id number]   Diastolic CHF ([place]) [id number]   grade 1   Essential hypertension, benign 06/17/2018   History of non-ST elevation myocardial infarction (NSTEMI) 06/17/2018   HLD (hyperlipidemia)    Hypertension    Irregular heart rate    Ischemic cardiomyopathy 06/17/2018   Myocardial infarction ([place]) 12/2017    [facility]   OSA (obstructive sleep apnea)    uses adaptive servo ventilation machine at night   Pneumonia    Seizure disorder ([place])    last seizure in the 1950s-well controlled with keppra   Skin cancer    left arm   Discharge Diagnoses:   Principal Problem:   OA (osteoarthritis) of knee Active Problems:   Primary osteoarthritis of right knee  Estimated body mass index is 27.78 kg/m as calculated from the following:   Height as of this encounter: '4\' 8"'$  (1.422 m).   Weight as of this encounter: 56.2 kg.  Procedure:  Procedure(s) (LRB): TOTAL KNEE ARTHROPLASTY (Right)   Consults: None  HPI: [name] is a 87 y.o. year old female with end stage OA of her right knee with progressively worsening pain and dysfunction. She has constant pain, with activity and at rest and significant functional deficits with difficulties even with ADLs. She has had extensive non-op management including analgesics, injections of cortisone and viscosupplements, and home exercise program, but remains in significant pain with significant dysfunction.Radiographs show bone on bone arthritis all 3 compartments. She presents  now for right Total Knee Arthroplasty.     Laboratory Data: Admission on 11/15/2020, Discharged on 11/18/2020  Component Date Value Ref Range Status   WBC 11/16/2020 7.4  4.0 - 10.5 K/uL Final   RBC 11/16/2020 3.22 (A) 3.87 - 5.11 MIL/uL Final   Hemoglobin 11/16/2020 9.8 (A) 12.0 - 15.0 g/dL Final   HCT 11/16/2020 31.8 (A) 36.0 - 46.0 % Final   MCV 11/16/2020 98.8  80.0 - 100.0 fL Final   MCH 11/16/2020 30.4  26.0 - 34.0 pg Final   MCHC 11/16/2020 30.8  30.0 - 36.0 g/dL Final   RDW 11/16/2020 14.1  11.5 - 15.5 % Final   Platelets 11/16/2020 145 (A) 150 - 400 K/uL Final   nRBC 11/16/2020 0.0  0.0 - 0.2 % Final   Performed at [facility], [place] [address] [place], [place] [address]   Sodium 11/16/2020 142  135 - 145 mmol/L Final   Potassium 11/16/2020 4.8  3.5 - 5.1 mmol/L Final   Chloride 11/16/2020 109  98 - 111 mmol/L Final   CO2 11/16/2020 25  22 - 32 mmol/L Final   Glucose, Bld 11/16/2020 200 (A) 70 - 99 mg/dL Final   Glucose reference range applies only to samples taken after fasting for at least 8 hours.   BUN 11/16/2020 25 (A) 8 - 23 mg/dL Final   Creatinine, Ser 11/16/2020 0.87  0.44 - 1.00 mg/dL Final   Calcium 11/16/2020 8.8 (A) 8.9 - 10.3 mg/dL Final   GFR,  Estimated 11/16/2020 >60  >60 mL/min Final   Comment: (NOTE) Calculated using the CKD-EPI Creatinine Equation (2021)    Anion gap 11/16/2020 8  5 - 15 Final   Performed at [facility], [place] [address], [place], [place] [address]   WBC 11/17/2020 9.4  4.0 - 10.5 K/uL Final   RBC 11/17/2020 2.97 (A) 3.87 - 5.11 MIL/uL Final   Hemoglobin 11/17/2020 9.2 (A) 12.0 - 15.0 g/dL Final   HCT 11/17/2020 29.6 (A) 36.0 - 46.0 % Final   MCV 11/17/2020 99.7  80.0 - 100.0 fL Final   MCH 11/17/2020 31.0  26.0 - 34.0 pg Final   MCHC 11/17/2020 31.1  30.0 - 36.0 g/dL Final   RDW 11/17/2020 14.4  11.5 - 15.5 % Final   Platelets 11/17/2020 148 (A) 150 - 400 K/uL Final   nRBC 11/17/2020 0.0  0.0 -  0.2 % Final   Performed at [facility], [place] [address] [place], [place] [address]   Sodium 11/17/2020 142  135 - 145 mmol/L Final   Potassium 11/17/2020 4.5  3.5 - 5.1 mmol/L Final   Chloride 11/17/2020 112 (A) 98 - 111 mmol/L Final   CO2 11/17/2020 24  22 - 32 mmol/L Final   Glucose, Bld 11/17/2020 109 (A) 70 - 99 mg/dL Final   Glucose reference range applies only to samples taken after fasting for at least 8 hours.   BUN 11/17/2020 25 (A) 8 - 23 mg/dL Final   Creatinine, Ser 11/17/2020 0.64  0.44 - 1.00 mg/dL Final   Calcium 11/17/2020 8.8 (A) 8.9 - 10.3 mg/dL Final   GFR, Estimated 11/17/2020 >60  >60 mL/min Final   Comment: (NOTE) Calculated using the CKD-EPI Creatinine Equation (2021)    Anion gap 11/17/2020 6  5 - 15 Final   Performed at [facility], [place] [address] [place], [place] [address]   WBC 11/18/2020 9.1  4.0 - 10.5 K/uL Final   RBC 11/18/2020 3.07 (A) 3.87 - 5.11 MIL/uL Final   Hemoglobin 11/18/2020 9.5 (A) 12.0 - 15.0 g/dL Final   HCT 11/18/2020 30.3 (A) 36.0 - 46.0 % Final   MCV 11/18/2020 98.7  80.0 - 100.0 fL Final   MCH 11/18/2020 30.9  26.0 - 34.0 pg Final   MCHC 11/18/2020 31.4  30.0 - 36.0 g/dL Final   RDW 11/18/2020 14.7  11.5 - 15.5 % Final   Platelets 11/18/2020 151  150 - 400 K/uL Final   nRBC 11/18/2020 0.0  0.0 - 0.2 % Final   Performed at [facility], [place] [address], [place], [place] [address]  Hospital Outpatient Visit on 11/11/2020  Component Date Value Ref Range Status   SARS Coronavirus 2 11/11/2020 NEGATIVE  NEGATIVE Final   Comment: (NOTE) SARS-CoV-2 target nucleic acids are NOT DETECTED.  The SARS-CoV-2 RNA is generally detectable in upper and lower respiratory specimens during the acute phase of infection. Negative results do not preclude SARS-CoV-2 infection, do not rule out co-infections with other pathogens, and should not be used as the sole basis for treatment or other  patient management decisions. Negative results must be combined with clinical observations, patient history, and epidemiological information. The expected result is Negative.  Fact Sheet for Patients: SugarRoll.be  Fact Sheet for Healthcare Providers: https://www.woods-mathews.com/  This test is not yet approved or cleared by the [place] FDA and  has been authorized for detection and/or diagnosis of SARS-CoV-2 by FDA under an Emergency Use Authorization (EUA).  This EUA will remain  in effect (meaning this test can be used) for the duration of the COVID-19 declaration under Se                          ction 564(b)(1) of the Act, 21 U.S.C. section 360bbb-3(b)(1), unless the authorization is terminated or revoked sooner.  Performed at [facility], [place] [address], [place], [place] [address]   Hospital Outpatient Visit on 11/04/2020  Component Date Value Ref Range Status   MRSA, PCR 11/04/2020 NEGATIVE  NEGATIVE Final   Staphylococcus aureus 11/04/2020 POSITIVE (A) NEGATIVE Final   Comment: (NOTE) The Xpert SA Assay (FDA approved for NASAL specimens in patients 40 years of age and older), is one component of a comprehensive surveillance program. It is not intended to diagnose infection nor to guide or monitor treatment. Performed at [facility], [place] [address], [place], [place] [address]    WBC 11/04/2020 5.3  4.0 - 10.5 K/uL Final   RBC 11/04/2020 3.54 (A) 3.87 - 5.11 MIL/uL Final   Hemoglobin 11/04/2020 10.9 (A) 12.0 - 15.0 g/dL Final   HCT 11/04/2020 34.1 (A) 36.0 - 46.0 % Final   MCV 11/04/2020 96.3  80.0 - 100.0 fL Final   MCH 11/04/2020 30.8  26.0 - 34.0 pg Final   MCHC 11/04/2020 32.0  30.0 - 36.0 g/dL Final   RDW 11/04/2020 14.1  11.5 - 15.5 % Final   Platelets 11/04/2020 160  150 - 400 K/uL Final   nRBC 11/04/2020 0.0  0.0 - 0.2 % Final   Performed at [facility], [place]  [address] [place], [place] [address]   Sodium 11/04/2020 142  135 - 145 mmol/L Final   Potassium 11/04/2020 4.2  3.5 - 5.1 mmol/L Final   Chloride 11/04/2020 110  98 - 111 mmol/L Final   CO2 11/04/2020 26  22 - 32 mmol/L Final   Glucose, Bld 11/04/2020 103 (A) 70 - 99 mg/dL Final   Glucose reference range applies only to samples taken after fasting for at least 8 hours.   BUN 11/04/2020 23  8 - 23 mg/dL Final   Creatinine, Ser 11/04/2020 0.73  0.44 - 1.00 mg/dL Final   Calcium 11/04/2020 9.6  8.9 - 10.3 mg/dL Final   Total Protein 11/04/2020 7.5  6.5 - 8.1 g/dL Final   Albumin 11/04/2020 4.3  3.5 - 5.0 g/dL Final   AST 11/04/2020 24  15 - 41 U/L Final   ALT 11/04/2020 17  0 - 44 U/L Final   Alkaline Phosphatase 11/04/2020 98  38 - 126 U/L Final   Total Bilirubin 11/04/2020 0.5  0.3 - 1.2 mg/dL Final   GFR, Estimated 11/04/2020 >60  >60 mL/min Final   Comment: (NOTE) Calculated using the CKD-EPI Creatinine Equation (2021)    Anion gap 11/04/2020 6  5 - 15 Final   Performed at [facility], [place] [address] [place], [place] [address]   Prothrombin Time 11/04/2020 14.2  11.4 - 15.2 seconds Final   INR 11/04/2020 1.1  0.8 - 1.2 Final   Comment: (NOTE) INR goal varies based on device and disease states. Performed at [facility], [place] [address], [place],  [address]      X-Rays:No results found.  EKG: Orders placed or performed in visit on 10/22/20   EKG 12-Lead     Hospital Course: [name] is a 87 y.o. who  was [facility]. They were brought to the operating room on 11/15/2020 and underwent Procedure(s): TOTAL KNEE ARTHROPLASTY.  Patient tolerated the procedure well and was later transferred to the recovery room and then to the orthopaedic floor for postoperative care. They were given PO and IV analgesics for pain control following their surgery. They were given 24 hours of postoperative antibiotics of   Anti-infectives (From admission, onward)    Start     Dose/Rate Route Frequency Ordered Stop   11/15/20 1845  ceFAZolin (ANCEF) 2 g in sodium chloride 0.9 % 100 mL IVPB        2 g 200 mL/hr over 30 Minutes Intravenous Every 6 hours 11/15/20 1620 11/16/20 0102   11/15/20 1015  ceFAZolin (ANCEF) 2 g in sodium chloride 0.9 % 100 mL IVPB        2 g 200 mL/hr over 30 Minutes Intravenous On call to O.R. 11/15/20 1010 11/15/20 1314      and started on DVT prophylaxis in the form of  Eliquis .   PT and OT were ordered for total joint protocol. Discharge planning consulted to help with postop disposition and equipment needs. Patient had a good night on the evening of surgery. They started to get up OOB with therapy on POD #1. Patient had slower progression with physical therapy, prompting longer hospital stay to ensure independent mobility upon discharge. Had issues with hypotension and mild dizziness with standing on day one, two separate 250 mL boluses were ordered with improvement. Continued to work with therapy into POD #2 and #3. Pt was seen during rounds on day three and was ready to go home pending progress with therapy. Dressing was changed and the incision was clean, dry, and intact on day two. Pt worked with therapy for two additional sessions on day three and was meeting their goals. She was discharged to home later that day in stable condition.  Diet: Cardiac diet Activity: WBAT Follow-up: in 2 weeks Disposition: Home Discharged Condition: stable   Discharge Instructions     Call MD / Call 911   Complete by: As directed    If you experience chest pain or shortness of breath, CALL 911 and be transported to the hospital emergency room.  If you develope a fever above 101 F, pus (white drainage) or increased drainage or redness at the wound, or calf pain, call your surgeon's office.   Change dressing   Complete by: As directed    You may remove the bulky bandage (ACE wrap and gauze) two  days after surgery. You will have an adhesive waterproof bandage underneath. Leave this in place until your first follow-up appointment.   Constipation Prevention   Complete by: As directed    Drink plenty of fluids.  Prune juice may be helpful.  You may use a stool softener, such as Colace (over the counter) 100 mg twice a day.  Use MiraLax (over the counter) for constipation as needed.   Diet - low sodium heart healthy   Complete by: As directed    Do not put a pillow under the knee. Place it under the heel.   Complete by: As directed    Driving restrictions   Complete by: As directed    No driving for two weeks   Post-operative opioid taper instructions:   Complete by: As directed    POST-OPERATIVE OPIOID TAPER INSTRUCTIONS: It is important to wean off of your opioid medication as soon as possible. If you  do not need pain medication after your surgery it is ok to stop day one. Opioids include: Codeine, Hydrocodone(Norco, Vicodin), Oxycodone(Percocet, oxycontin) and hydromorphone amongst others.  Long term and even short term use of opiods can cause: Increased pain response Dependence Constipation Depression Respiratory depression And more.  Withdrawal symptoms can include Flu like symptoms Nausea, vomiting And more Techniques to manage these symptoms Hydrate well Eat regular healthy meals Stay active Use relaxation techniques(deep breathing, meditating, yoga) Do Not substitute Alcohol to help with tapering If you have been on opioids for less than two weeks and do not have pain than it is ok to stop all together.  Plan to wean off of opioids This plan should start within one week post op of your joint replacement. Maintain the same interval or time between taking each dose and first decrease the dose.  Cut the total daily intake of opioids by one tablet each day Next start to increase the time between doses. The last dose that should be eliminated is the evening dose.       TED hose   Complete by: As directed    Use stockings (TED hose) for three weeks on both leg(s).  You may remove them at night for sleeping.   Weight bearing as tolerated   Complete by: As directed       Allergies as of 11/18/2020       Reactions   Codeine Other (See Comments), Hypertension   Causes a sensation of "pressure" within her head   Latex Itching   Tape Itching        Medication List     STOP taking these medications    aspirin EC 81 MG tablet   Cholecalciferol 25 MCG (1000 UT) tablet   Coenzyme Q10 60 MG Tabs   Fish Oil 1000 MG Caps   vitamin B-12 500 MCG tablet Commonly known as: CYANOCOBALAMIN       TAKE these medications    acetaminophen 325 MG tablet Commonly known as: TYLENOL Take 650 mg by mouth every 6 (six) hours as needed for moderate pain.   amLODipine 10 MG tablet Commonly known as: NORVASC Take 1 tablet (10 mg total) by mouth daily.   apixaban 2.5 MG Tabs tablet Commonly known as: ELIQUIS Take 1 tablet (2.5 mg total) by mouth every 12 (twelve) hours for 19 days. Then resume one 81 mg aspirin once a day.   diphenoxylate-atropine 2.5-0.025 MG tablet Commonly known as: LOMOTIL Take 1 tablet by mouth 4 (four) times daily as needed for diarrhea or loose stools.   ezetimibe 10 MG tablet Commonly known as: ZETIA Take 1 tablet (10 mg total) by mouth daily.   fluocinonide ointment 0.05 % Commonly known as: LIDEX Apply 1 application topically 2 (two) times daily as needed (dermatitis).   HYDROcodone-acetaminophen 5-325 MG tablet Commonly known as: NORCO/VICODIN Take 1 tablet by mouth every 6 (six) hours as needed for severe pain.   methocarbamol 500 MG tablet Commonly known as: ROBAXIN Take 1 tablet (500 mg total) by mouth every 6 (six) hours as needed for muscle spasms.   nitroGLYCERIN 0.4 MG SL tablet Commonly known as: NITROSTAT Place 1 tablet (0.4 mg total) under the tongue every 5 (five) minutes as needed for chest pain.    olmesartan 20 MG tablet Commonly known as: BENICAR Take 1 tablet (20 mg total) by mouth daily.   oxybutynin 10 MG 24 hr tablet Commonly known as: DITROPAN-XL Take 10 mg by mouth daily.  traMADol 50 MG tablet Commonly known as: Ultram Take 1 tablet (50 mg total) by mouth every 6 (six) hours as needed for severe pain.       ASK your doctor about these medications    levETIRAcetam 500 MG tablet Commonly known as: KEPPRA Take 1 tablet (500 mg total) by mouth 2 (two) times daily.               Discharge Care Instructions  (From admission, onward)           Start     Ordered   11/17/20 0000  Weight bearing as tolerated        11/17/20 0729   11/17/20 0000  Change dressing       Comments: You may remove the bulky bandage (ACE wrap and gauze) two days after surgery. You will have an adhesive waterproof bandage underneath. Leave this in place until your first follow-up appointment.   11/17/20 0729            Follow-up Information     Gaynelle Arabian, MD Follow up.   Specialty: Orthopedic Surgery Why: You are scheduled for an appointment on 11/30/20 at 3:15 pm Contact information: [address] [place] [address] [id number]                 Signed: Theresa Duty, PA-C Orthopedic Surgery 11/22/2020, 8:11 AM
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
## 2020-11-24 ENCOUNTER — Ambulatory Visit: Payer: Medicare Other

## 2020-11-24 ENCOUNTER — Other Ambulatory Visit: Payer: Self-pay

## 2020-11-24 DIAGNOSIS — R600 Localized edema: Secondary | ICD-10-CM

## 2020-11-24 DIAGNOSIS — M25661 Stiffness of right knee, not elsewhere classified: Secondary | ICD-10-CM | POA: Diagnosis not present

## 2020-11-24 DIAGNOSIS — M6281 Muscle weakness (generalized): Secondary | ICD-10-CM

## 2020-11-24 DIAGNOSIS — M25561 Pain in right knee: Secondary | ICD-10-CM

## 2020-11-24 DIAGNOSIS — R262 Difficulty in walking, not elsewhere classified: Secondary | ICD-10-CM

## 2020-11-24 DIAGNOSIS — Z96651 Presence of right artificial knee joint: Secondary | ICD-10-CM
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
## 2020-11-26 ENCOUNTER — Ambulatory Visit: Payer: Medicare Other

## 2020-11-26 ENCOUNTER — Other Ambulatory Visit: Payer: Self-pay

## 2020-11-26 DIAGNOSIS — R262 Difficulty in walking, not elsewhere classified: Secondary | ICD-10-CM

## 2020-11-26 DIAGNOSIS — R600 Localized edema: Secondary | ICD-10-CM

## 2020-11-26 DIAGNOSIS — M6281 Muscle weakness (generalized): Secondary | ICD-10-CM

## 2020-11-26 DIAGNOSIS — M25661 Stiffness of right knee, not elsewhere classified: Secondary | ICD-10-CM | POA: Diagnosis not present

## 2020-11-26 DIAGNOSIS — Z96651 Presence of right artificial knee joint: Secondary | ICD-10-CM

## 2020-11-26 DIAGNOSIS — M25561 Pain in right knee: Secondary | ICD-10-CM
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
## 2020-11-29 ENCOUNTER — Other Ambulatory Visit: Payer: Self-pay

## 2020-11-29 ENCOUNTER — Ambulatory Visit: Payer: Medicare Other | Admitting: Physical Therapy

## 2020-11-29 DIAGNOSIS — R262 Difficulty in walking, not elsewhere classified: Secondary | ICD-10-CM

## 2020-11-29 DIAGNOSIS — M6281 Muscle weakness (generalized): Secondary | ICD-10-CM

## 2020-11-29 DIAGNOSIS — M25661 Stiffness of right knee, not elsewhere classified: Secondary | ICD-10-CM

## 2020-11-29 DIAGNOSIS — R600 Localized edema: Secondary | ICD-10-CM

## 2020-11-29 DIAGNOSIS — M25561 Pain in right knee: Secondary | ICD-10-CM
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
## 2020-12-01 ENCOUNTER — Ambulatory Visit: Payer: Medicare Other | Admitting: Physical Therapy

## 2020-12-01 ENCOUNTER — Encounter: Payer: Self-pay | Admitting: Physical Therapy

## 2020-12-01 ENCOUNTER — Other Ambulatory Visit: Payer: Self-pay

## 2020-12-01 DIAGNOSIS — M25661 Stiffness of right knee, not elsewhere classified: Secondary | ICD-10-CM | POA: Diagnosis not present

## 2020-12-01 DIAGNOSIS — R262 Difficulty in walking, not elsewhere classified: Secondary | ICD-10-CM

## 2020-12-01 DIAGNOSIS — R600 Localized edema: Secondary | ICD-10-CM

## 2020-12-01 DIAGNOSIS — Z96651 Presence of right artificial knee joint: Secondary | ICD-10-CM

## 2020-12-01 DIAGNOSIS — M6281 Muscle weakness (generalized): Secondary | ICD-10-CM

## 2020-12-01 DIAGNOSIS — M25561 Pain in right knee: Secondary | ICD-10-CM
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
## 2020-12-03 ENCOUNTER — Ambulatory Visit: Payer: Medicare Other | Admitting: Physical Therapy

## 2020-12-03 ENCOUNTER — Encounter: Payer: Self-pay | Admitting: Physical Therapy

## 2020-12-03 ENCOUNTER — Other Ambulatory Visit: Payer: Self-pay

## 2020-12-03 DIAGNOSIS — M25661 Stiffness of right knee, not elsewhere classified: Secondary | ICD-10-CM

## 2020-12-03 DIAGNOSIS — M6281 Muscle weakness (generalized): Secondary | ICD-10-CM

## 2020-12-03 DIAGNOSIS — R262 Difficulty in walking, not elsewhere classified: Secondary | ICD-10-CM

## 2020-12-03 DIAGNOSIS — Z96651 Presence of right artificial knee joint: Secondary | ICD-10-CM

## 2020-12-03 DIAGNOSIS — R600 Localized edema: Secondary | ICD-10-CM

## 2020-12-03 DIAGNOSIS — M25561 Pain in right knee: Secondary | ICD-10-CM
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
## 2020-12-06 ENCOUNTER — Other Ambulatory Visit: Payer: Self-pay

## 2020-12-06 ENCOUNTER — Encounter: Payer: Self-pay | Admitting: Physical Therapy

## 2020-12-06 ENCOUNTER — Ambulatory Visit: Payer: Medicare Other | Admitting: Physical Therapy

## 2020-12-06 DIAGNOSIS — R262 Difficulty in walking, not elsewhere classified: Secondary | ICD-10-CM

## 2020-12-06 DIAGNOSIS — M25661 Stiffness of right knee, not elsewhere classified: Secondary | ICD-10-CM | POA: Diagnosis not present

## 2020-12-06 DIAGNOSIS — M6281 Muscle weakness (generalized): Secondary | ICD-10-CM

## 2020-12-06 DIAGNOSIS — Z96651 Presence of right artificial knee joint: Secondary | ICD-10-CM

## 2020-12-06 DIAGNOSIS — R600 Localized edema: Secondary | ICD-10-CM

## 2020-12-06 DIAGNOSIS — M25561 Pain in right knee: Secondary | ICD-10-CM
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
## 2020-12-08 ENCOUNTER — Ambulatory Visit: Payer: Medicare Other | Admitting: Physical Therapy

## 2020-12-08 ENCOUNTER — Other Ambulatory Visit: Payer: Self-pay

## 2020-12-08 ENCOUNTER — Encounter: Payer: Self-pay | Admitting: Physical Therapy

## 2020-12-08 DIAGNOSIS — M25661 Stiffness of right knee, not elsewhere classified: Secondary | ICD-10-CM | POA: Diagnosis not present

## 2020-12-08 DIAGNOSIS — R600 Localized edema: Secondary | ICD-10-CM

## 2020-12-08 DIAGNOSIS — M25561 Pain in right knee: Secondary | ICD-10-CM

## 2020-12-08 DIAGNOSIS — M6281 Muscle weakness (generalized): Secondary | ICD-10-CM
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
## 2020-12-10 ENCOUNTER — Other Ambulatory Visit: Payer: Self-pay

## 2020-12-10 ENCOUNTER — Encounter: Payer: Self-pay | Admitting: Physical Therapy

## 2020-12-10 ENCOUNTER — Ambulatory Visit: Payer: Medicare Other | Admitting: Physical Therapy

## 2020-12-10 ENCOUNTER — Ambulatory Visit: Payer: Medicare Other | Admitting: Cardiology

## 2020-12-10 DIAGNOSIS — R600 Localized edema: Secondary | ICD-10-CM

## 2020-12-10 DIAGNOSIS — M25661 Stiffness of right knee, not elsewhere classified: Secondary | ICD-10-CM

## 2020-12-10 DIAGNOSIS — M6281 Muscle weakness (generalized): Secondary | ICD-10-CM

## 2020-12-10 DIAGNOSIS — Z96651 Presence of right artificial knee joint: Secondary | ICD-10-CM

## 2020-12-10 DIAGNOSIS — M25561 Pain in right knee: Secondary | ICD-10-CM

## 2020-12-10 DIAGNOSIS — R262 Difficulty in walking, not elsewhere classified: Secondary | ICD-10-CM
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
## 2020-12-13 ENCOUNTER — Ambulatory Visit: Payer: Medicare Other | Admitting: Physical Therapy

## 2020-12-13 ENCOUNTER — Ambulatory Visit: Payer: Medicare Other | Admitting: Cardiology

## 2020-12-13 ENCOUNTER — Other Ambulatory Visit: Payer: Self-pay

## 2020-12-13 ENCOUNTER — Ambulatory Visit: Payer: Medicare Other

## 2020-12-13 DIAGNOSIS — R262 Difficulty in walking, not elsewhere classified: Secondary | ICD-10-CM

## 2020-12-13 DIAGNOSIS — M6281 Muscle weakness (generalized): Secondary | ICD-10-CM

## 2020-12-13 DIAGNOSIS — Z96651 Presence of right artificial knee joint: Secondary | ICD-10-CM

## 2020-12-13 DIAGNOSIS — R600 Localized edema: Secondary | ICD-10-CM

## 2020-12-13 DIAGNOSIS — M25661 Stiffness of right knee, not elsewhere classified: Secondary | ICD-10-CM | POA: Diagnosis not present

## 2020-12-13 DIAGNOSIS — M25561 Pain in right knee: Secondary | ICD-10-CM
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
## 2020-12-15 ENCOUNTER — Ambulatory Visit: Payer: Medicare Other | Admitting: Physical Therapy

## 2020-12-15 ENCOUNTER — Other Ambulatory Visit: Payer: Self-pay

## 2020-12-15 ENCOUNTER — Encounter: Payer: Self-pay | Admitting: Physical Therapy

## 2020-12-15 DIAGNOSIS — M25661 Stiffness of right knee, not elsewhere classified: Secondary | ICD-10-CM

## 2020-12-15 DIAGNOSIS — R262 Difficulty in walking, not elsewhere classified: Secondary | ICD-10-CM

## 2020-12-15 DIAGNOSIS — M25561 Pain in right knee: Secondary | ICD-10-CM

## 2020-12-15 DIAGNOSIS — Z96651 Presence of right artificial knee joint: Secondary | ICD-10-CM

## 2020-12-15 DIAGNOSIS — R600 Localized edema: Secondary | ICD-10-CM

## 2020-12-15 DIAGNOSIS — M6281 Muscle weakness (generalized): Secondary | ICD-10-CM
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
## 2020-12-17 ENCOUNTER — Encounter: Payer: Self-pay | Admitting: Physical Therapy

## 2020-12-17 ENCOUNTER — Ambulatory Visit: Payer: Medicare Other | Admitting: Physical Therapy

## 2020-12-17 ENCOUNTER — Other Ambulatory Visit: Payer: Self-pay

## 2020-12-17 DIAGNOSIS — M6281 Muscle weakness (generalized): Secondary | ICD-10-CM

## 2020-12-17 DIAGNOSIS — M25661 Stiffness of right knee, not elsewhere classified: Secondary | ICD-10-CM

## 2020-12-17 DIAGNOSIS — Z96651 Presence of right artificial knee joint: Secondary | ICD-10-CM

## 2020-12-17 DIAGNOSIS — R262 Difficulty in walking, not elsewhere classified: Secondary | ICD-10-CM

## 2020-12-17 DIAGNOSIS — M25561 Pain in right knee: Secondary | ICD-10-CM

## 2020-12-17 DIAGNOSIS — R600 Localized edema: Secondary | ICD-10-CM
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
## 2020-12-20 ENCOUNTER — Encounter: Payer: Self-pay | Admitting: Physical Therapy

## 2020-12-20 ENCOUNTER — Other Ambulatory Visit: Payer: Self-pay

## 2020-12-20 ENCOUNTER — Ambulatory Visit: Payer: Medicare Other | Admitting: Physical Therapy

## 2020-12-20 DIAGNOSIS — M25661 Stiffness of right knee, not elsewhere classified: Secondary | ICD-10-CM | POA: Diagnosis not present

## 2020-12-20 DIAGNOSIS — R600 Localized edema: Secondary | ICD-10-CM

## 2020-12-20 DIAGNOSIS — R262 Difficulty in walking, not elsewhere classified: Secondary | ICD-10-CM

## 2020-12-20 DIAGNOSIS — M6281 Muscle weakness (generalized): Secondary | ICD-10-CM

## 2020-12-20 DIAGNOSIS — Z96651 Presence of right artificial knee joint: Secondary | ICD-10-CM

## 2020-12-20 DIAGNOSIS — M25561 Pain in right knee: Secondary | ICD-10-CM
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
## 2020-12-22 ENCOUNTER — Other Ambulatory Visit: Payer: Self-pay

## 2020-12-22 ENCOUNTER — Ambulatory Visit: Payer: Medicare Other | Admitting: Physical Therapy

## 2020-12-22 ENCOUNTER — Encounter: Payer: Self-pay | Admitting: Physical Therapy

## 2020-12-22 DIAGNOSIS — Z96651 Presence of right artificial knee joint: Secondary | ICD-10-CM

## 2020-12-22 DIAGNOSIS — M6281 Muscle weakness (generalized): Secondary | ICD-10-CM

## 2020-12-22 DIAGNOSIS — M25661 Stiffness of right knee, not elsewhere classified: Secondary | ICD-10-CM | POA: Diagnosis not present

## 2020-12-22 DIAGNOSIS — R600 Localized edema: Secondary | ICD-10-CM

## 2020-12-22 DIAGNOSIS — M25561 Pain in right knee: Secondary | ICD-10-CM

## 2020-12-22 DIAGNOSIS — M79671 Pain in right foot: Secondary | ICD-10-CM

## 2020-12-22 DIAGNOSIS — R262 Difficulty in walking, not elsewhere classified: Secondary | ICD-10-CM
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
## 2020-12-24 ENCOUNTER — Ambulatory Visit: Payer: Medicare Other | Attending: Student | Admitting: Physical Therapy

## 2020-12-24 ENCOUNTER — Encounter: Payer: Self-pay | Admitting: Physical Therapy

## 2020-12-24 ENCOUNTER — Other Ambulatory Visit: Payer: Self-pay

## 2020-12-24 DIAGNOSIS — Z96651 Presence of right artificial knee joint: Secondary | ICD-10-CM | POA: Insufficient documentation

## 2020-12-24 DIAGNOSIS — R262 Difficulty in walking, not elsewhere classified: Secondary | ICD-10-CM | POA: Diagnosis present

## 2020-12-24 DIAGNOSIS — M79671 Pain in right foot: Secondary | ICD-10-CM | POA: Insufficient documentation

## 2020-12-24 DIAGNOSIS — M25661 Stiffness of right knee, not elsewhere classified: Secondary | ICD-10-CM | POA: Diagnosis not present

## 2020-12-24 DIAGNOSIS — R600 Localized edema: Secondary | ICD-10-CM | POA: Insufficient documentation

## 2020-12-24 DIAGNOSIS — M6281 Muscle weakness (generalized): Secondary | ICD-10-CM | POA: Diagnosis present

## 2020-12-24 DIAGNOSIS — M25561 Pain in right knee: Secondary | ICD-10-CM | POA: Insufficient documentation
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
## 2021-01-12 ENCOUNTER — Other Ambulatory Visit: Payer: Self-pay | Admitting: Cardiology

## 2021-01-12 DIAGNOSIS — I10 Essential (primary) hypertension: Secondary | ICD-10-CM

## 2021-01-18 ENCOUNTER — Other Ambulatory Visit: Payer: Self-pay | Admitting: Cardiology

## 2021-01-18 DIAGNOSIS — I10 Essential (primary) hypertension: Secondary | ICD-10-CM

## 2021-01-24 ENCOUNTER — Ambulatory Visit: Payer: Medicare Other | Attending: Student | Admitting: Physical Therapy

## 2021-01-24 ENCOUNTER — Other Ambulatory Visit: Payer: Self-pay

## 2021-01-24 ENCOUNTER — Ambulatory Visit: Payer: Medicare Other | Admitting: Cardiology

## 2021-01-24 ENCOUNTER — Encounter: Payer: Self-pay | Admitting: Physical Therapy

## 2021-01-24 DIAGNOSIS — M25661 Stiffness of right knee, not elsewhere classified: Secondary | ICD-10-CM | POA: Insufficient documentation

## 2021-01-24 DIAGNOSIS — M6281 Muscle weakness (generalized): Secondary | ICD-10-CM | POA: Insufficient documentation

## 2021-01-24 DIAGNOSIS — Z96651 Presence of right artificial knee joint: Secondary | ICD-10-CM | POA: Diagnosis present

## 2021-01-24 DIAGNOSIS — M79671 Pain in right foot: Secondary | ICD-10-CM | POA: Diagnosis present

## 2021-01-24 DIAGNOSIS — M25561 Pain in right knee: Secondary | ICD-10-CM | POA: Diagnosis present

## 2021-01-24 DIAGNOSIS — R262 Difficulty in walking, not elsewhere classified: Secondary | ICD-10-CM | POA: Diagnosis present

## 2021-01-24 DIAGNOSIS — R600 Localized edema: Secondary | ICD-10-CM | POA: Diagnosis present
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
## 2021-01-27 ENCOUNTER — Ambulatory Visit: Payer: Medicare Other | Admitting: Physical Therapy

## 2021-01-27 ENCOUNTER — Other Ambulatory Visit: Payer: Self-pay

## 2021-01-27 ENCOUNTER — Encounter: Payer: Self-pay | Admitting: Physical Therapy

## 2021-01-27 DIAGNOSIS — M25661 Stiffness of right knee, not elsewhere classified: Secondary | ICD-10-CM

## 2021-01-27 DIAGNOSIS — M6281 Muscle weakness (generalized): Secondary | ICD-10-CM

## 2021-01-27 DIAGNOSIS — R262 Difficulty in walking, not elsewhere classified: Secondary | ICD-10-CM

## 2021-01-27 DIAGNOSIS — R600 Localized edema: Secondary | ICD-10-CM

## 2021-01-27 DIAGNOSIS — M25561 Pain in right knee: Secondary | ICD-10-CM

## 2021-01-27 DIAGNOSIS — Z96651 Presence of right artificial knee joint: Secondary | ICD-10-CM
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
## 2021-02-02 ENCOUNTER — Other Ambulatory Visit: Payer: Self-pay

## 2021-02-02 ENCOUNTER — Encounter: Payer: Self-pay | Admitting: Cardiology

## 2021-02-02 ENCOUNTER — Ambulatory Visit: Payer: Medicare Other | Admitting: Cardiology

## 2021-02-02 VITALS — BP 139/64 | HR 75 | Temp 97.8°F | Resp 16 | Ht <= 58 in | Wt 106.0 lb

## 2021-02-02 DIAGNOSIS — I2510 Atherosclerotic heart disease of native coronary artery without angina pectoris: Secondary | ICD-10-CM

## 2021-02-02 DIAGNOSIS — I10 Essential (primary) hypertension: Secondary | ICD-10-CM

## 2021-02-02 NOTE — Progress Notes (Signed)
Follow up visit  Subjective:   [name], female    DOB: 1929/07/13, 87 y.o.   MRN: [id number]    HPI  Chief Complaint  Patient presents with   Coronary Artery Disease     87 y/o Caucasian female with hypertension, CAD, h/o NSTEMI, h/o seizure disorder  Patient is here with her son.  She recently underwent knee surgery without any perioperative cardiac events.  Overall, denies any chest pain, shortness of breath, regular symptoms.  Blood pressure is well controlled.  She has not been taking olmesartan for over a week, and asks me if she should get it refilled.   Current Outpatient Medications on File Prior to Visit  Medication Sig Dispense Refill   acetaminophen (TYLENOL) 325 MG tablet Take 650 mg by mouth every 6 (six) hours as needed for moderate pain.     amLODipine (NORVASC) 10 MG tablet Take 1 tablet (10 mg total) by mouth daily. 30 tablet 0   aspirin EC 81 MG tablet Take 81 mg by mouth daily. Swallow whole.     diphenoxylate-atropine (LOMOTIL) 2.5-0.025 MG tablet Take 1 tablet by mouth 4 (four) times daily as needed for diarrhea or loose stools. 30 tablet 0   ezetimibe (ZETIA) 10 MG tablet Take 1 tablet (10 mg total) by mouth daily. 60 tablet 1   fluocinonide ointment (LIDEX) 0.03 % Apply 1 application topically 2 (two) times daily as needed (dermatitis).     levETIRAcetam (KEPPRA) 500 MG tablet Take 1 tablet (500 mg total) by mouth 2 (two) times daily. (Patient taking differently: Take 500 mg by mouth daily.) 60 tablet 0   nitroGLYCERIN (NITROSTAT) 0.4 MG SL tablet Place 1 tablet (0.4 mg total) under the tongue every 5 (five) minutes as needed for chest pain. 20 tablet 0   olmesartan (BENICAR) 20 MG tablet Take 1 tablet (20 mg total) by mouth daily. 30 tablet 0   oxybutynin (DITROPAN-XL) 10 MG 24 hr tablet Take 10 mg by mouth daily.     rOPINIRole (REQUIP) 0.5 MG tablet Take 0.5 mg by mouth at bedtime.     traMADol (ULTRAM) 50 MG tablet Take 1 tablet (50 mg total)  by mouth every 6 (six) hours as needed for severe pain. 20 tablet 0   VITAMIN D PO Take 5,000 mg by mouth daily at 6 (six) AM.     No current facility-administered medications on file prior to visit.    Cardiovascular & other pertient studies:  EKG 10/22/2020: Sinus rhythm 84 bpm with multifocal PACs Anteroseptal infarct -age undetermined  Echocardiogram 12/20/2019:  1. Limited study.   2. Left ventricular ejection fraction, by estimation, is 60 to 65%. The  left ventricle has normal function. The left ventricle has no regional  wall motion abnormalities. There is mild left ventricular hypertrophy.  Left ventricular diastolic function  could not be evaluated.   3. Right ventricular systolic function is normal. The right ventricular  size is normal. There is moderately elevated pulmonary artery systolic  pressure. The estimated right ventricular systolic pressure is 70.4 mmHg.   4. The mitral valve is abnormal, mildly thickened and calcified. Trivial  mitral valve regurgitation.   5. The aortic valve is tricuspid. Aortic valve regurgitation is not  visualized. Mild aortic valve sclerosis is present, with no evidence of  aortic valve stenosis.   6. The inferior vena cava is normal in size with greater than 50%  respiratory variability, suggesting right atrial pressure of 3 mmHg.   Recent  labs: 05/14/2020: Glucose 104, BUN/Cr 18/0.86. EGFR ?60. Na/K 137/3.7 H/H 11/24. MCV 92. Platelets 266  08/06/2019: Glucose 97, BUN/Cr 19/0.77. EGFR 70. Na/K 140/4.2. Rest of the CMP normal H/H 9.9/29.2. MCV 92. Platelets 225  04/2018: Glucose 126, BUN/Cr 26/0.8. EGFR >60. Na/K 135/4.1. Rest of the CMP normal H/H 9.6/29.8. MCV 97. Platelets 120     Review of Systems  Cardiovascular:  Negative for chest pain, dyspnea on exertion, leg swelling, palpitations and syncope.  Musculoskeletal:  Positive for joint pain.        Vitals:   02/02/21 1438  BP: 139/64  Pulse: 75  Resp: 16  Temp:  97.8 F (36.6 C)  SpO2: 99%     Body mass index is 23.76 kg/m. Filed Weights   02/02/21 1438  Weight: 106 lb (48.1 kg)     Objective:   Physical Exam Vitals and nursing note reviewed.  Constitutional:      General: She is not in acute distress. Neck:     Vascular: No JVD.  Cardiovascular:     Rate and Rhythm: Normal rate and regular rhythm.     Heart sounds: Normal heart sounds. No murmur heard. Pulmonary:     Effort: Pulmonary effort is normal.     Breath sounds: Normal breath sounds. No wheezing or rales.  Musculoskeletal:     Right lower leg: No edema.     Left lower leg: No edema.          Assessment & Recommendations:   87 y/o Caucasian female with hypertension, CAD, h/o NSTEMI, h/o seizure disorder  CAD: Stable. Prior NSTEMI. No ongoing angina symptoms. Continue Aspirin, Zetia. Reasonable to omit statin given age and relative stability of CAD  Hypertension: Well controlled without olmesartan. Reasonable to omit  F/u in 1 year  Nigel Mormon, MD [facility] Pager: [phone] Office: [phone]

## 2021-02-07 ENCOUNTER — Other Ambulatory Visit: Payer: Self-pay

## 2021-02-07 ENCOUNTER — Encounter: Payer: Self-pay | Admitting: Physical Therapy

## 2021-02-07 ENCOUNTER — Ambulatory Visit: Payer: Medicare Other | Admitting: Physical Therapy

## 2021-02-07 DIAGNOSIS — M6281 Muscle weakness (generalized): Secondary | ICD-10-CM

## 2021-02-07 DIAGNOSIS — M79671 Pain in right foot: Secondary | ICD-10-CM

## 2021-02-07 DIAGNOSIS — M25661 Stiffness of right knee, not elsewhere classified: Secondary | ICD-10-CM | POA: Diagnosis not present

## 2021-02-07 DIAGNOSIS — M25561 Pain in right knee: Secondary | ICD-10-CM

## 2021-02-07 DIAGNOSIS — R600 Localized edema: Secondary | ICD-10-CM

## 2021-02-07 DIAGNOSIS — R262 Difficulty in walking, not elsewhere classified: Secondary | ICD-10-CM

## 2021-02-07 DIAGNOSIS — Z96651 Presence of right artificial knee joint: Secondary | ICD-10-CM
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
## 2021-02-10 ENCOUNTER — Ambulatory Visit: Payer: Medicare Other | Admitting: Physical Therapy

## 2021-02-10 ENCOUNTER — Encounter: Payer: Self-pay | Admitting: Physical Therapy

## 2021-02-10 ENCOUNTER — Other Ambulatory Visit: Payer: Self-pay

## 2021-02-10 DIAGNOSIS — M25661 Stiffness of right knee, not elsewhere classified: Secondary | ICD-10-CM | POA: Diagnosis not present

## 2021-02-10 DIAGNOSIS — R262 Difficulty in walking, not elsewhere classified: Secondary | ICD-10-CM
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
## 2021-02-16 ENCOUNTER — Other Ambulatory Visit: Payer: Self-pay

## 2021-02-16 ENCOUNTER — Encounter: Payer: Self-pay | Admitting: Physical Therapy

## 2021-02-16 ENCOUNTER — Ambulatory Visit: Payer: Medicare Other | Admitting: Physical Therapy

## 2021-02-16 DIAGNOSIS — M6281 Muscle weakness (generalized): Secondary | ICD-10-CM

## 2021-02-16 DIAGNOSIS — R262 Difficulty in walking, not elsewhere classified: Secondary | ICD-10-CM

## 2021-02-16 DIAGNOSIS — M25661 Stiffness of right knee, not elsewhere classified: Secondary | ICD-10-CM | POA: Diagnosis not present

## 2021-02-16 DIAGNOSIS — M25561 Pain in right knee: Secondary | ICD-10-CM

## 2021-02-16 DIAGNOSIS — R600 Localized edema: Secondary | ICD-10-CM
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
## 2021-02-17 ENCOUNTER — Ambulatory Visit: Payer: Medicare Other | Admitting: Physical Therapy

## 2021-02-17 ENCOUNTER — Encounter: Payer: Self-pay | Admitting: Physical Therapy

## 2021-02-17 DIAGNOSIS — M6281 Muscle weakness (generalized): Secondary | ICD-10-CM

## 2021-02-17 DIAGNOSIS — M25661 Stiffness of right knee, not elsewhere classified: Secondary | ICD-10-CM

## 2021-02-17 DIAGNOSIS — M25561 Pain in right knee: Secondary | ICD-10-CM

## 2021-02-17 DIAGNOSIS — R262 Difficulty in walking, not elsewhere classified: Secondary | ICD-10-CM
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
## 2021-02-21 ENCOUNTER — Ambulatory Visit: Payer: Medicare Other | Admitting: Physical Therapy

## 2021-02-23 ENCOUNTER — Ambulatory Visit: Payer: Medicare Other | Admitting: Physical Therapy

## 2021-02-28 ENCOUNTER — Encounter: Payer: Medicare Other | Admitting: Physical Therapy

## 2021-03-01 ENCOUNTER — Other Ambulatory Visit: Payer: Self-pay

## 2021-03-01 ENCOUNTER — Ambulatory Visit: Payer: Medicare Other | Attending: Student | Admitting: Physical Therapy

## 2021-03-01 ENCOUNTER — Encounter: Payer: Self-pay | Admitting: Physical Therapy

## 2021-03-01 DIAGNOSIS — M6281 Muscle weakness (generalized): Secondary | ICD-10-CM | POA: Insufficient documentation

## 2021-03-01 DIAGNOSIS — R262 Difficulty in walking, not elsewhere classified: Secondary | ICD-10-CM | POA: Diagnosis not present

## 2021-03-01 DIAGNOSIS — M25561 Pain in right knee: Secondary | ICD-10-CM | POA: Diagnosis present

## 2021-03-01 DIAGNOSIS — Z96651 Presence of right artificial knee joint: Secondary | ICD-10-CM | POA: Insufficient documentation

## 2021-03-01 DIAGNOSIS — R600 Localized edema: Secondary | ICD-10-CM | POA: Diagnosis present

## 2021-03-01 DIAGNOSIS — M79671 Pain in right foot: Secondary | ICD-10-CM | POA: Diagnosis present

## 2021-03-01 DIAGNOSIS — M25661 Stiffness of right knee, not elsewhere classified: Secondary | ICD-10-CM | POA: Diagnosis present
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
## 2021-03-03 ENCOUNTER — Other Ambulatory Visit: Payer: Self-pay

## 2021-03-03 ENCOUNTER — Ambulatory Visit: Payer: Medicare Other | Admitting: Physical Therapy

## 2021-03-03 DIAGNOSIS — R262 Difficulty in walking, not elsewhere classified: Secondary | ICD-10-CM

## 2021-03-03 DIAGNOSIS — R600 Localized edema: Secondary | ICD-10-CM

## 2021-03-03 DIAGNOSIS — M25561 Pain in right knee: Secondary | ICD-10-CM

## 2021-03-03 DIAGNOSIS — M79671 Pain in right foot: Secondary | ICD-10-CM

## 2021-03-03 DIAGNOSIS — Z96651 Presence of right artificial knee joint: Secondary | ICD-10-CM

## 2021-03-03 DIAGNOSIS — M25661 Stiffness of right knee, not elsewhere classified: Secondary | ICD-10-CM

## 2021-03-03 DIAGNOSIS — M6281 Muscle weakness (generalized): Secondary | ICD-10-CM
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
## 2021-03-08 ENCOUNTER — Other Ambulatory Visit: Payer: Self-pay

## 2021-03-08 ENCOUNTER — Encounter: Payer: Self-pay | Admitting: Physical Therapy

## 2021-03-08 ENCOUNTER — Ambulatory Visit: Payer: Medicare Other | Admitting: Physical Therapy

## 2021-03-08 DIAGNOSIS — R262 Difficulty in walking, not elsewhere classified: Secondary | ICD-10-CM

## 2021-03-08 DIAGNOSIS — R600 Localized edema: Secondary | ICD-10-CM

## 2021-03-08 DIAGNOSIS — Z96651 Presence of right artificial knee joint: Secondary | ICD-10-CM

## 2021-03-08 DIAGNOSIS — M25661 Stiffness of right knee, not elsewhere classified: Secondary | ICD-10-CM

## 2021-03-08 DIAGNOSIS — M25561 Pain in right knee: Secondary | ICD-10-CM

## 2021-03-08 DIAGNOSIS — M6281 Muscle weakness (generalized): Secondary | ICD-10-CM
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
## 2021-03-10 ENCOUNTER — Other Ambulatory Visit: Payer: Self-pay

## 2021-03-10 ENCOUNTER — Encounter: Payer: Self-pay | Admitting: Physical Therapy

## 2021-03-10 ENCOUNTER — Ambulatory Visit: Payer: Medicare Other | Admitting: Physical Therapy

## 2021-03-10 DIAGNOSIS — M6281 Muscle weakness (generalized): Secondary | ICD-10-CM

## 2021-03-10 DIAGNOSIS — R262 Difficulty in walking, not elsewhere classified: Secondary | ICD-10-CM

## 2021-03-10 DIAGNOSIS — M25561 Pain in right knee: Secondary | ICD-10-CM

## 2021-03-10 DIAGNOSIS — M25661 Stiffness of right knee, not elsewhere classified: Secondary | ICD-10-CM
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
## 2021-03-17 ENCOUNTER — Other Ambulatory Visit: Payer: Self-pay

## 2021-03-17 ENCOUNTER — Emergency Department (HOSPITAL_COMMUNITY): Payer: Medicare Other

## 2021-03-17 ENCOUNTER — Inpatient Hospital Stay (HOSPITAL_COMMUNITY)
Admission: EM | Admit: 2021-03-17 | Discharge: 2021-03-22 | DRG: 480 | Disposition: A | Discharge status: Home Health | Payer: Medicare Other | Attending: Internal Medicine | Admitting: Internal Medicine

## 2021-03-17 DIAGNOSIS — Z66 Do not resuscitate: Secondary | ICD-10-CM | POA: Diagnosis present

## 2021-03-17 DIAGNOSIS — W010XXA Fall on same level from slipping, tripping and stumbling without subsequent striking against object, initial encounter: Secondary | ICD-10-CM | POA: Diagnosis present

## 2021-03-17 DIAGNOSIS — D62 Acute posthemorrhagic anemia: Secondary | ICD-10-CM | POA: Diagnosis not present

## 2021-03-17 DIAGNOSIS — N19 Unspecified kidney failure: Secondary | ICD-10-CM | POA: Diagnosis not present

## 2021-03-17 DIAGNOSIS — J309 Allergic rhinitis, unspecified: Secondary | ICD-10-CM | POA: Diagnosis present

## 2021-03-17 DIAGNOSIS — Z96651 Presence of right artificial knee joint: Secondary | ICD-10-CM | POA: Diagnosis present

## 2021-03-17 DIAGNOSIS — D6481 Anemia due to antineoplastic chemotherapy: Secondary | ICD-10-CM | POA: Diagnosis not present

## 2021-03-17 DIAGNOSIS — Z20822 Contact with and (suspected) exposure to covid-19: Secondary | ICD-10-CM | POA: Diagnosis present

## 2021-03-17 DIAGNOSIS — E785 Hyperlipidemia, unspecified: Secondary | ICD-10-CM

## 2021-03-17 DIAGNOSIS — Z79899 Other long term (current) drug therapy: Secondary | ICD-10-CM | POA: Diagnosis not present

## 2021-03-17 DIAGNOSIS — N183 Chronic kidney disease, stage 3 unspecified: Secondary | ICD-10-CM | POA: Diagnosis present

## 2021-03-17 DIAGNOSIS — D696 Thrombocytopenia, unspecified: Secondary | ICD-10-CM | POA: Diagnosis present

## 2021-03-17 DIAGNOSIS — Z8249 Family history of ischemic heart disease and other diseases of the circulatory system: Secondary | ICD-10-CM

## 2021-03-17 DIAGNOSIS — I5032 Chronic diastolic (congestive) heart failure: Secondary | ICD-10-CM | POA: Diagnosis present

## 2021-03-17 DIAGNOSIS — M810 Age-related osteoporosis without current pathological fracture: Secondary | ICD-10-CM | POA: Diagnosis present

## 2021-03-17 DIAGNOSIS — I2510 Atherosclerotic heart disease of native coronary artery without angina pectoris: Secondary | ICD-10-CM | POA: Diagnosis present

## 2021-03-17 DIAGNOSIS — I252 Old myocardial infarction: Secondary | ICD-10-CM

## 2021-03-17 DIAGNOSIS — S72001A Fracture of unspecified part of neck of right femur, initial encounter for closed fracture: Principal | ICD-10-CM

## 2021-03-17 DIAGNOSIS — S7290XA Unspecified fracture of unspecified femur, initial encounter for closed fracture: Secondary | ICD-10-CM

## 2021-03-17 DIAGNOSIS — G40909 Epilepsy, unspecified, not intractable, without status epilepticus: Secondary | ICD-10-CM | POA: Diagnosis present

## 2021-03-17 DIAGNOSIS — Z885 Allergy status to narcotic agent status: Secondary | ICD-10-CM

## 2021-03-17 DIAGNOSIS — I10 Essential (primary) hypertension: Secondary | ICD-10-CM | POA: Diagnosis not present

## 2021-03-17 DIAGNOSIS — Y92009 Unspecified place in unspecified non-institutional (private) residence as the place of occurrence of the external cause: Secondary | ICD-10-CM | POA: Diagnosis not present

## 2021-03-17 DIAGNOSIS — G4733 Obstructive sleep apnea (adult) (pediatric): Secondary | ICD-10-CM | POA: Diagnosis present

## 2021-03-17 DIAGNOSIS — S72141A Displaced intertrochanteric fracture of right femur, initial encounter for closed fracture: Principal | ICD-10-CM | POA: Diagnosis present

## 2021-03-17 DIAGNOSIS — W19XXXA Unspecified fall, initial encounter: Secondary | ICD-10-CM | POA: Diagnosis not present

## 2021-03-17 DIAGNOSIS — Z85828 Personal history of other malignant neoplasm of skin: Secondary | ICD-10-CM | POA: Diagnosis not present

## 2021-03-17 DIAGNOSIS — J9600 Acute respiratory failure, unspecified whether with hypoxia or hypercapnia: Secondary | ICD-10-CM | POA: Diagnosis not present

## 2021-03-17 DIAGNOSIS — D509 Iron deficiency anemia, unspecified: Secondary | ICD-10-CM | POA: Diagnosis present

## 2021-03-17 DIAGNOSIS — Z90710 Acquired absence of both cervix and uterus: Secondary | ICD-10-CM | POA: Diagnosis not present

## 2021-03-17 DIAGNOSIS — Z9049 Acquired absence of other specified parts of digestive tract: Secondary | ICD-10-CM

## 2021-03-17 DIAGNOSIS — E860 Dehydration: Secondary | ICD-10-CM

## 2021-03-17 DIAGNOSIS — I130 Hypertensive heart and chronic kidney disease with heart failure and stage 1 through stage 4 chronic kidney disease, or unspecified chronic kidney disease: Secondary | ICD-10-CM | POA: Diagnosis present

## 2021-03-17 DIAGNOSIS — Z808 Family history of malignant neoplasm of other organs or systems: Secondary | ICD-10-CM | POA: Diagnosis not present

## 2021-03-17 DIAGNOSIS — T451X5A Adverse effect of antineoplastic and immunosuppressive drugs, initial encounter: Secondary | ICD-10-CM | POA: Diagnosis not present

## 2021-03-17 DIAGNOSIS — Z7982 Long term (current) use of aspirin: Secondary | ICD-10-CM

## 2021-03-17 DIAGNOSIS — M25551 Pain in right hip: Secondary | ICD-10-CM | POA: Diagnosis present

## 2021-03-17 DIAGNOSIS — Z91040 Latex allergy status: Secondary | ICD-10-CM

## 2021-03-17 LAB — CBC WITH DIFFERENTIAL/PLATELET
Abs Immature Granulocytes: 0.03 10*3/uL (ref 0.00–0.07)
Basophils Absolute: 0 10*3/uL (ref 0.0–0.1)
Basophils Relative: 1 %
Eosinophils Absolute: 0.1 10*3/uL (ref 0.0–0.5)
Eosinophils Relative: 2 %
HCT: 31.7 % — ABNORMAL LOW (ref 36.0–46.0)
Hemoglobin: 10.1 g/dL — ABNORMAL LOW (ref 12.0–15.0)
Immature Granulocytes: 1 %
Lymphocytes Relative: 15 %
Lymphs Abs: 1 10*3/uL (ref 0.7–4.0)
MCH: 30.3 pg (ref 26.0–34.0)
MCHC: 31.9 g/dL (ref 30.0–36.0)
MCV: 95.2 fL (ref 80.0–100.0)
Monocytes Absolute: 0.6 10*3/uL (ref 0.1–1.0)
Monocytes Relative: 9 %
Neutro Abs: 4.8 10*3/uL (ref 1.7–7.7)
Neutrophils Relative %: 72 %
Platelets: 129 10*3/uL — ABNORMAL LOW (ref 150–400)
RBC: 3.33 MIL/uL — ABNORMAL LOW (ref 3.87–5.11)
RDW: 14 % (ref 11.5–15.5)
WBC: 6.6 10*3/uL (ref 4.0–10.5)
nRBC: 0 % (ref 0.0–0.2)

## 2021-03-17 LAB — BASIC METABOLIC PANEL
Anion gap: 8 (ref 5–15)
BUN: 20 mg/dL (ref 8–23)
CO2: 23 mmol/L (ref 22–32)
Calcium: 9.1 mg/dL (ref 8.9–10.3)
Chloride: 108 mmol/L (ref 98–111)
Creatinine, Ser: 0.77 mg/dL (ref 0.44–1.00)
GFR, Estimated: >60 mL/min (ref >60)
Glucose, Bld: 138 mg/dL — ABNORMAL HIGH (ref 70–99)
Potassium: 3.8 mmol/L (ref 3.5–5.1)
Sodium: 139 mmol/L (ref 135–145)

## 2021-03-17 LAB — RESP PANEL BY RT-PCR (FLU A&B, COVID) ARPGX2
Influenza A by PCR: NEGATIVE
Influenza B by PCR: NEGATIVE
SARS Coronavirus 2 by RT PCR: NEGATIVE

## 2021-03-17 MED ORDER — MORPHINE SULFATE (PF) 4 MG/ML IV SOLN
3.0000 mg | INTRAVENOUS | Status: DC | PRN
  Administered 2021-03-17 – 2021-03-18 (×4): 3 mg via INTRAVENOUS
  Filled 2021-03-17 (×4): qty 1

## 2021-03-17 MED ORDER — NALOXONE HCL 0.4 MG/ML IJ SOLN: 0.4000 mg | INTRAMUSCULAR | Status: DC | PRN

## 2021-03-17 MED ORDER — ACETAMINOPHEN 325 MG PO TABS
650.0000 mg | ORAL_TABLET | Freq: Four times a day (QID) | ORAL | Status: DC | PRN
  Filled 2021-03-17: qty 2

## 2021-03-17 MED ORDER — ACETAMINOPHEN 650 MG RE SUPP: 650.0000 mg | Freq: Four times a day (QID) | RECTAL | Status: DC | PRN

## 2021-03-17 MED ORDER — MORPHINE SULFATE (PF) 2 MG/ML IV SOLN
2.0000 mg | Freq: Once | INTRAVENOUS | Status: AC
  Administered 2021-03-17: 2 mg via INTRAVENOUS
  Filled 2021-03-17: qty 1

## 2021-03-17 MED ORDER — ONDANSETRON HCL 4 MG/2ML IJ SOLN
4.0000 mg | Freq: Four times a day (QID) | INTRAMUSCULAR | Status: DC | PRN
  Administered 2021-03-17: 4 mg via INTRAVENOUS
  Filled 2021-03-17: qty 2

## 2021-03-17 NOTE — ED Triage Notes (Signed)
BIB [facility] from home. Fell from standing. Was reaching for remote and lost footing. Pt landed on right leg. Knee and mid femur deformity. Rotation out and shortening of the right leg. No thinners. Did not hit head and no LOC.  Abrasion to the right wrist.   126/61 90 heart rate CBG 207 95% room air   18L hand 219mcg of fentanyl.
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
## 2021-03-17 NOTE — H&P (Signed)
History and Physical    PLEASE NOTE THAT DRAGON DICTATION SOFTWARE WAS USED IN THE CONSTRUCTION OF THIS NOTE.   [name] DOB: [birth year] DOA: 03/17/2021  PCP: Bernerd Limbo, MD  Patient coming from: home   I have personally briefly reviewed patient's old medical records in [place]  Chief Complaint: Right hip pain  HPI: [name] is a 87 y.o. female with medical history significant for hypertension, hyperlipidemia who is admitted to [facility] on 03/17/2021 with acute right intertrochanteric hip fracture after presenting from home to [facility] ED complaining of right hip pain after fall at home.     Patient reports tripping while attempting to ambulate at home, resulting in a ground level fall during which right hip was the principal point of contact with the carpeted floor below. As a result of this fall, the patient reports immediate development of sharp right hip pain, with radiation into the right groin. States that this pain has been constant since onset, with exacerbation when attempting to move the right lower extremity.  As a consequence of the associated intensity of this discomfort, reports that he is unable to bear weight on the right lower extremity at this time.  This is relative to baseline functional status in which the patient lives with her son and is able to ambulate with the aid of a walker, but otherwise does not require additional assistance, including no assistance with transfers.  Otherwise, denies any acute arthralgias or myalgias as a result of the above fall.  Denies any acute numbness or paresthesias in bilateral lower extremities.   Will this fall was not directly witnessed, her son, who is at home at the time, heard the patient fall to the carpeted floor, and was by her side and less than 10 seconds, confirming that there was no loss of consciousness by the time he had arrived at her side.   Did not hit head as a  component of this fall, and denies any associated loss of consciousness.  Denies any preceding or associated chest pain, shortness of breath, diaphoresis, palpitations, nausea, vomiting, dizziness, presyncope, or syncope.  Denies any subsequent headache, neck pain, blurry vision, or diplopia.  On a daily baby aspirin at home, with most recent dose occurring on the morning of 03/17/2021.  Otherwise, denies any use of blood thinners as an outpatient.  No recent orthopnea, PND, or peripheral edema,  Per chart review, most recent echocardiogram occurred in August 2021 and was notable for LVEF 60 to 65%, no focal wall motion normalities, but unable to evaluate diastolic parameters.  Echocardiogram in January 2020 was notable for diastolic parameters that were found to be within normal limits.  She is not on a diuretic medications at home.     ED Course:  Vital signs in the ED were notable for the following:  Afebrile, heart rate 7392, blood pressure 127/67- 142/89; respiratory rate 16-19, oxygen saturation 98 to 100% on room air.   Labs were notable for the following: BMP notable for the following: BUN 20, creatinine 0.77, BUN to creatinine ratio 26, glucose 138.  CBC notable for white blood cell count 6600.  COVID-19/influenza PCR were checked in the ED this evening and found to be negative.  Imaging and additional notable ED work-up: EKG showed sinus rhythm with PACs, heart rate 84, and no evidence of T wave or ST changes, including no evidence of ST elevation.  Chest x-ray showed no evidence of acute cardiopulmonary process.  Plain films of the right femur/pelvis showed acute intertrochanteric right femur fracture as well as proximal femoral shaft fracture.  EDP discussed the patient's case and imaging with the on-call orthopedic surgeon, Dr. Wynelle Link of [facility], who recommended admission to the hospitalist service at [place] for further evaluation and management of acute right hip fracture.  Dr.  Wynelle Link to formally consult, will evaluate the patient in the morning.  While in the ED, the following were administered: Morphine 2 mg IV x1.  Subsequently, the patient was admitted to [place] at [facility] for further evaluation management of acute right intertrochanteric hip fracture.     Review of Systems: As per HPI otherwise 10 point review of systems negative.   Past Medical History:  Diagnosis Date   Abdominal pain, other specified site    Allergic rhinitis    Anemia    Arthritis    CAD (coronary artery disease) 12/2017   s/p stent   Chronic kidney disease    stage 3 kidney disease per patient on 9/98/33   Diastolic CHF ([place]) 82/5053   grade 1   Essential hypertension, benign 06/17/2018   History of non-ST elevation myocardial infarction (NSTEMI) 06/17/2018   HLD (hyperlipidemia)    Hypertension    Irregular heart rate    Ischemic cardiomyopathy 06/17/2018   Myocardial infarction ([place]) 12/2017    [facility]   OSA (obstructive sleep apnea)    uses adaptive servo ventilation machine at night   Pneumonia    Seizure disorder ([place])    last seizure in the 1950s-well controlled with keppra   Skin cancer    left arm    Past Surgical History:  Procedure Laterality Date   BACK SURGERY     CARDIAC CATHETERIZATION  02/09/2000   CHOLECYSTECTOMY     EYE SURGERY Bilateral    cataracts removed   HARDWARE REMOVAL Right 05/14/2020   Procedure: HARDWARE REMOVAL RIGHT FEMUR;  Surgeon: Shona Needles, MD;  Location: [place];  Service: Orthopedics;  Laterality: Right;   KNEE SURGERY     laparoscopic   ORIF FEMUR FRACTURE Right 12/15/2019   Procedure: OPEN REDUCTION INTERNAL FIXATION (ORIF) DISTAL FEMUR FRACTURE;  Surgeon: Shona Needles, MD;  Location: [place];  Service: Orthopedics;  Laterality: Right;   TOTAL ABDOMINAL HYSTERECTOMY     TOTAL KNEE ARTHROPLASTY Right 11/15/2020   Procedure: TOTAL KNEE ARTHROPLASTY;  Surgeon: Gaynelle Arabian, MD;  Location: [facility];  Service:  Orthopedics;  Laterality: Right;  30min   WRIST FRACTURE SURGERY Bilateral    x 2    Social History:  reports that she has never smoked. She has never used smokeless tobacco. She reports that she does not drink alcohol and does not use drugs.   Allergies  Allergen Reactions   Codeine Other (See Comments) and Hypertension    Causes a sensation of "pressure" within her head   Latex Itching   Tape Itching    Family History  Problem Relation Age of Onset   Heart disease Father    Skin cancer Father    Skin cancer Mother    Heart attack Mother    Stroke Sister    Congenital heart disease Niece    Renal Disease Niece     Family history reviewed and not pertinent    Prior to Admission medications   Medication Sig Start Date End Date Taking? Authorizing Provider  acetaminophen (TYLENOL) 325 MG tablet Take 650 mg by mouth every 6 (six) hours as needed  for moderate pain.    [provider]  amLODipine (NORVASC) 10 MG tablet Take 1 tablet (10 mg total) by mouth daily. 01/15/20   Medina-Vargas, Monina C, NP  aspirin EC 81 MG tablet Take 81 mg by mouth daily. Swallow whole.    [provider]  diphenoxylate-atropine (LOMOTIL) 2.5-0.025 MG tablet Take 1 tablet by mouth 4 (four) times daily as needed for diarrhea or loose stools. 01/15/20   Medina-Vargas, Monina C, NP  ezetimibe (ZETIA) 10 MG tablet Take 1 tablet (10 mg total) by mouth daily. 09/15/20   Patwardhan, Reynold Bowen, MD  fluocinonide ointment (LIDEX) 3.88 % Apply 1 application topically 2 (two) times daily as needed (dermatitis). 07/14/20   [provider]  levETIRAcetam (KEPPRA) 500 MG tablet Take 1 tablet (500 mg total) by mouth 2 (two) times daily. Patient taking differently: Take 500 mg by mouth daily. 01/15/20   Medina-Vargas, Monina C, NP  nitroGLYCERIN (NITROSTAT) 0.4 MG SL tablet Place 1 tablet (0.4 mg total) under the tongue every 5 (five) minutes as needed for chest pain. 01/15/20   Medina-Vargas,  Monina C, NP  olmesartan (BENICAR) 20 MG tablet Take 1 tablet (20 mg total) by mouth daily. Patient not taking: Reported on 02/02/2021 01/15/20 02/02/21  Medina-Vargas, Monina C, NP  oxybutynin (DITROPAN-XL) 10 MG 24 hr tablet Take 10 mg by mouth daily. 03/12/20   [provider]  rOPINIRole (REQUIP) 0.5 MG tablet Take 0.5 mg by mouth at bedtime. 12/10/20   [provider]  traMADol (ULTRAM) 50 MG tablet Take 1 tablet (50 mg total) by mouth every 6 (six) hours as needed for severe pain. 05/14/20   [name], PA-C  VITAMIN D PO Take 5,000 mg by mouth daily at 6 (six) AM.    [provider]     Objective    Physical Exam: Vitals:   03/17/21 2130 03/17/21 2230 03/17/21 2245 03/17/21 2300  BP: (!) 133/54 (!) 121/45 (!) 135/46 (!) 133/40  Pulse: 77 73 73 73  Resp: 17 19 (!) 21 16  Temp:      TempSrc:      SpO2: 92% 99% 99% 99%  Weight:      Height:        General: appears to be stated age; alert, oriented Skin: warm, dry, no rash Head:  AT/[place] Mouth:  Oral mucosa membranes appear dry, normal dentition Neck: supple; trachea midline Heart:  RRR; did not appreciate any M/R/G Lungs: CTAB, did not appreciate any wheezes, rales, or rhonchi Abdomen: + BS; soft, ND, NT Vascular: 2+ pedal pulses b/l; 2+ radial pulses b/l Extremities: Right hip appears externally rotated, and shorter than left counterpart , no peripheral edema, no muscle wasting Neuro:  sensation intact in upper and lower extremities b/l; deferred evaluation of strength involving the right lower extremity in setting of acute right hip fracture.     Labs on Admission: I have personally reviewed following labs and imaging studies  CBC: Recent Labs  Lab 03/17/21 2109  WBC 6.6  NEUTROABS 4.8  HGB 10.1*  HCT 31.7*  MCV 95.2  PLT 828*   Basic Metabolic Panel: Recent Labs  Lab 03/17/21 2109  NA 139  K 3.8  CL 108  CO2 23  GLUCOSE 138*  BUN 20  CREATININE 0.77  CALCIUM 9.1    GFR: Estimated Creatinine Clearance: 42.6 mL/min (by C-G formula based on SCr of 0.77 mg/dL). Liver Function Tests: No results for input(s): AST, ALT, ALKPHOS, BILITOT, PROT, ALBUMIN  in the last 168 hours. No results for input(s): LIPASE, AMYLASE in the last 168 hours. No results for input(s): AMMONIA in the last 168 hours. Coagulation Profile: No results for input(s): INR, PROTIME in the last 168 hours. Cardiac Enzymes: No results for input(s): CKTOTAL, CKMB, CKMBINDEX, TROPONINI in the last 168 hours. BNP (last 3 results) No results for input(s): PROBNP in the last 8760 hours. HbA1C: No results for input(s): HGBA1C in the last 72 hours. CBG: No results for input(s): GLUCAP in the last 168 hours. Lipid Profile: No results for input(s): CHOL, HDL, LDLCALC, TRIG, CHOLHDL, LDLDIRECT in the last 72 hours. Thyroid Function Tests: No results for input(s): TSH, T4TOTAL, FREET4, T3FREE, THYROIDAB in the last 72 hours. Anemia Panel: No results for input(s): VITAMINB12, FOLATE, FERRITIN, TIBC, IRON, RETICCTPCT in the last 72 hours. Urine analysis:    Component Value Date/Time   COLORURINE YELLOW 12/14/2019 1517   APPEARANCEUR CLEAR 12/14/2019 1517   LABSPEC 1.012 12/14/2019 1517   PHURINE 7.0 12/14/2019 1517   GLUCOSEU NEGATIVE 12/14/2019 1517   HGBUR NEGATIVE 12/14/2019 1517   BILIRUBINUR NEGATIVE 12/14/2019 1517   KETONESUR NEGATIVE 12/14/2019 1517   PROTEINUR NEGATIVE 12/14/2019 1517   NITRITE NEGATIVE 12/14/2019 1517   LEUKOCYTESUR TRACE (A) 12/14/2019 1517    Radiological Exams on Admission: DG Chest 1 View  Result Date: 03/17/2021 CLINICAL DATA:  Status post fall EXAM: CHEST  1 VIEW COMPARISON:  Chest x-ray 12/14/2019 FINDINGS: The heart and mediastinal contours are unchanged. Aortic calcification. No focal consolidation. Persistent coarsened interstitial markings with no overt pulmonary edema. No pleural effusion. No pneumothorax. No acute osseous abnormality. IMPRESSION:  No active disease. Electronically Signed   By: Iven Finn M.D.   On: 03/17/2021 22:22   DG Pelvis 1-2 Views  Result Date: 03/17/2021 CLINICAL DATA:  Status post fall EXAM: PELVIS - 1-2 VIEW; RIGHT FEMUR 2 VIEWS COMPARISON:  X-ray right femur 12/15/2019, x-ray right hip 12/14/2019 FINDINGS: Pelvis: Markedly limited evaluation due to overlapping osseous structures and overlying soft tissues. There is no evidence of pelvic fracture or diastasis. No pelvic bone lesions are seen. Frontal view of the left hip grossly unremarkable. Right femur: Acute intertrochanteric and proximal femoral shaft fracture. Partially visualized total right knee arthroplasty. Femoral shaft markings suggestive of removal of prior surgical hardware. Vascular calcifications. IMPRESSION: 1. Acute intertrochanteric and proximal femoral shaft fracture. 2. No acute displaced fracture or diastasis of the bones of the pelvis. Markedly limited evaluation due to overlapping osseous structures and overlying soft tissues. 3. Frontal view of the left hip negative for acute traumatic injury. 4. Total right knee arthroplasty. Electronically Signed   By: Iven Finn M.D.   On: 03/17/2021 22:25   DG FEMUR, MIN 2 VIEWS RIGHT  Result Date: 03/17/2021 CLINICAL DATA:  Status post fall EXAM: PELVIS - 1-2 VIEW; RIGHT FEMUR 2 VIEWS COMPARISON:  X-ray right femur 12/15/2019, x-ray right hip 12/14/2019 FINDINGS: Pelvis: Markedly limited evaluation due to overlapping osseous structures and overlying soft tissues. There is no evidence of pelvic fracture or diastasis. No pelvic bone lesions are seen. Frontal view of the left hip grossly unremarkable. Right femur: Acute intertrochanteric and proximal femoral shaft fracture. Partially visualized total right knee arthroplasty. Femoral shaft markings suggestive of removal of prior surgical hardware. Vascular calcifications. IMPRESSION: 1. Acute intertrochanteric and proximal femoral shaft fracture. 2. No  acute displaced fracture or diastasis of the bones of the pelvis. Markedly limited evaluation due to overlapping osseous structures and overlying soft tissues. 3. Frontal view  of the left hip negative for acute traumatic injury. 4. Total right knee arthroplasty. Electronically Signed   By: Iven Finn M.D.   On: 03/17/2021 22:25     EKG: Independently reviewed, with result as described above.    Assessment/Plan   Principal Problem:   Closed right hip fracture [facility]) Active Problems:   Essential hypertension   Seizure disorder (HCC)   Dehydration   Acute prerenal azotemia   Fall at home, initial encounter   HLD (hyperlipidemia)     #) Acute right intertrochanteric femur fracture: confirmed via presenting plain films and stemming from ground level mechanical fall without associated loss of consciousness that occurred earlier on the day of admission, as further described above, resulting in immediate develop of acute right hip pain.  At this time, the right lower extremity is neurovascularly intact, and the patient reports adequate pain control.  On a daily baby aspirin as an outpatient, with most recent dose occurring on the morning of 03/17/2021, otherwise not on any additional blood thinners at home.  Case/imaging were discussed with the on-call orthopedic surgeon, Dr. Wynelle Link of [facility], who recommended admission to the hospitalist service at [place] for further evaluation and management of acute right hip fracture.  Dr. Wynelle Link to formally consult, will evaluate the patient in the morning.  Lyndel Safe Score for this patient in the context of anticipated aforementioned orthopedic surgery conveys a 1.82% perioperative risk for significant cardiac event. No evidence to suggest acutely decompensated heart failure or acute MI. Consequently, no absolute contraindications to proceeding with proposed orthopedic surgery at this time.    Plan: Formal orthopedic surgery consult for eval for  definitive surgical management,as above.  NPO after MN. No pharmacologic anticoagulation leading up to this anticipated surgery. SCD's. Prn IV morphine. Anticipate postoperative PT consult. Check INR.  Check 25-hydroxy vit D level. Holding home aspirin. Foley catheter has been placed.      #) Ground level mechanical fall: The patient reports a ground level mechanical fall earlier today in which she tripped without any associated loss of consciousness. Appears to be purely mechanical in nature, without clinical evidence at this time to suggest contributory dizziness, presyncope, syncope, or acute ischemic CVA. Does not appear to have hit head as component of this fall.  While this fall appears to be purely mechanical in nature, will also check urinalysis to evaluate for any underlying infectious contribution.   Plan: Check urinalysis, as above.  Repeat BMP and CBC with differential in the morning. Fall precautions. Anticipate postoperative PT consult.       #) Dehydration: Physical exam evidence and laboratory findings suggestive of this in the form of the appearance of dry oral mucous membranes as well as finding of acute prerenal azotemia.  Not associate with any hypotension or deviation from baseline renal function.   Plan: Normal saline at 50 cc/h x 8 hours.  Monitor strict I's and O's to weights.  Urinalysis, as above.  Repeat BMP in the morning.      #) Essential Hypertension: documented history of such, with outpatient antihypertensive regimen including Norvasc, olmesartan.  Systolic blood pressures pressure in the ED today 120's - 140's.    Plan: Close monitoring of subsequent blood pressure via routine vital signs.  In setting of current n.p.o. status, hold home antihypertensive medications for now      #) Hyperlipidemia: On Zetia at home.   Plan: In setting of current n.p.o. status, hold home Zetia for now.      #)  Seizure disorder: Documented history of such, on Keppra  500 mg p.o. nightly as outpatient.  No evidence of active seizures.   Plan: This patient is currently n.p.o., will hold home oral Keppra for now. Keppra 500 mg IV x1 dose now.       DVT prophylaxis: SCD's   Code Status: DNR (confirmed via my discussions with the patient as well as her son this evening) Family Communication: I discussed the patient's case with her son and daughter-in-law, both of whom are present at bedside Disposition Plan: Per Rounding Team Consults called: Dr. Wynelle Link of [facility] consulted, as further detailed above;  Admission status inpatient; med-surg; [facility]  Warrants inpatient status on basis of need for definitive surgical intervention for acute right intertrochanteric hip fracture for pain control as well as reestablishing patient's ambulatory status given her pretraumatic functional/ambulatory status/abilities.   PLEASE NOTE THAT DRAGON DICTATION SOFTWARE WAS USED IN THE CONSTRUCTION OF THIS NOTE.   [place] DO [facility]  From [place]   03/17/2021, 11:33 PM

## 2021-03-18 ENCOUNTER — Encounter (HOSPITAL_COMMUNITY): Payer: Self-pay | Admitting: Internal Medicine

## 2021-03-18 ENCOUNTER — Inpatient Hospital Stay (HOSPITAL_COMMUNITY): Payer: Medicare Other

## 2021-03-18 ENCOUNTER — Encounter (HOSPITAL_COMMUNITY)
Admission: EM | Disposition: A | Discharge status: Home Health | Payer: Self-pay | Source: Home / Self Care | Attending: Internal Medicine

## 2021-03-18 ENCOUNTER — Inpatient Hospital Stay (HOSPITAL_COMMUNITY): Payer: Medicare Other | Admitting: Certified Registered Nurse Anesthetist

## 2021-03-18 DIAGNOSIS — E860 Dehydration: Secondary | ICD-10-CM | POA: Diagnosis not present

## 2021-03-18 DIAGNOSIS — W19XXXA Unspecified fall, initial encounter: Secondary | ICD-10-CM

## 2021-03-18 DIAGNOSIS — Y92009 Unspecified place in unspecified non-institutional (private) residence as the place of occurrence of the external cause: Secondary | ICD-10-CM

## 2021-03-18 DIAGNOSIS — I10 Essential (primary) hypertension: Secondary | ICD-10-CM | POA: Diagnosis not present

## 2021-03-18 DIAGNOSIS — S72001A Fracture of unspecified part of neck of right femur, initial encounter for closed fracture: Secondary | ICD-10-CM | POA: Diagnosis not present

## 2021-03-18 DIAGNOSIS — N19 Unspecified kidney failure: Secondary | ICD-10-CM | POA: Diagnosis present

## 2021-03-18 DIAGNOSIS — E785 Hyperlipidemia, unspecified: Secondary | ICD-10-CM | POA: Diagnosis present

## 2021-03-18 HISTORY — PX: FEMUR IM NAIL: SHX1597

## 2021-03-18 LAB — TYPE AND SCREEN
ABO/RH(D): O POS
Antibody Screen: NEGATIVE

## 2021-03-18 LAB — CBC
HCT: 28.1 % — ABNORMAL LOW (ref 36.0–46.0)
Hemoglobin: 8.8 g/dL — ABNORMAL LOW (ref 12.0–15.0)
MCH: 30.4 pg (ref 26.0–34.0)
MCHC: 31.3 g/dL (ref 30.0–36.0)
MCV: 97.2 fL (ref 80.0–100.0)
Platelets: 115 10*3/uL — ABNORMAL LOW (ref 150–400)
RBC: 2.89 MIL/uL — ABNORMAL LOW (ref 3.87–5.11)
RDW: 14 % (ref 11.5–15.5)
WBC: 7 10*3/uL (ref 4.0–10.5)
nRBC: 0 % (ref 0.0–0.2)

## 2021-03-18 LAB — COMPREHENSIVE METABOLIC PANEL
ALT: 15 U/L (ref 0–44)
AST: 25 U/L (ref 15–41)
Albumin: 3.1 g/dL — ABNORMAL LOW (ref 3.5–5.0)
Alkaline Phosphatase: 85 U/L (ref 38–126)
Anion gap: 5 (ref 5–15)
BUN: 17 mg/dL (ref 8–23)
CO2: 25 mmol/L (ref 22–32)
Calcium: 8.5 mg/dL — ABNORMAL LOW (ref 8.9–10.3)
Chloride: 107 mmol/L (ref 98–111)
Creatinine, Ser: 0.73 mg/dL (ref 0.44–1.00)
GFR, Estimated: >60 mL/min (ref >60)
Glucose, Bld: 121 mg/dL — ABNORMAL HIGH (ref 70–99)
Potassium: 4.2 mmol/L (ref 3.5–5.1)
Sodium: 137 mmol/L (ref 135–145)
Total Bilirubin: 0.5 mg/dL (ref 0.3–1.2)
Total Protein: 5.5 g/dL — ABNORMAL LOW (ref 6.5–8.1)

## 2021-03-18 LAB — URINALYSIS, COMPLETE (UACMP) WITH MICROSCOPIC
Bacteria, UA: NONE SEEN
Bilirubin Urine: NEGATIVE
Glucose, UA: NEGATIVE mg/dL
Ketones, ur: NEGATIVE mg/dL
Leukocytes,Ua: NEGATIVE
Nitrite: NEGATIVE
Protein, ur: NEGATIVE mg/dL
Specific Gravity, Urine: 1.016 (ref 1.005–1.030)
pH: 6 (ref 5.0–8.0)

## 2021-03-18 LAB — PROTIME-INR
INR: 1.2 (ref 0.8–1.2)
Prothrombin Time: 15.5 seconds — ABNORMAL HIGH (ref 11.4–15.2)

## 2021-03-18 LAB — MAGNESIUM: Magnesium: 1.7 mg/dL (ref 1.7–2.4)

## 2021-03-18 LAB — VITAMIN D 25 HYDROXY (VIT D DEFICIENCY, FRACTURES): Vit D, 25-Hydroxy: 58.88 ng/mL (ref 30–100)

## 2021-03-18 SURGERY — INSERTION, INTRAMEDULLARY ROD, FEMUR
Anesthesia: General | Site: Hip | Laterality: Right

## 2021-03-18 MED ORDER — ONDANSETRON HCL 4 MG PO TABS: 4.0000 mg | ORAL_TABLET | Freq: Four times a day (QID) | ORAL | Status: DC | PRN

## 2021-03-18 MED ORDER — STERILE WATER FOR IRRIGATION IR SOLN
Status: DC | PRN
  Administered 2021-03-18: 1000 mL

## 2021-03-18 MED ORDER — MIDAZOLAM HCL 2 MG/2ML IJ SOLN
INTRAMUSCULAR | Status: AC
  Filled 2021-03-18: qty 2

## 2021-03-18 MED ORDER — AMISULPRIDE (ANTIEMETIC) 5 MG/2ML IV SOLN
INTRAVENOUS | Status: AC
  Filled 2021-03-18: qty 4

## 2021-03-18 MED ORDER — EPHEDRINE 5 MG/ML INJ
INTRAVENOUS | Status: AC
  Filled 2021-03-18: qty 5

## 2021-03-18 MED ORDER — METOCLOPRAMIDE HCL 5 MG PO TABS: 5.0000 mg | ORAL_TABLET | Freq: Three times a day (TID) | ORAL | Status: DC | PRN

## 2021-03-18 MED ORDER — POVIDONE-IODINE 10 % EX SWAB
2.0000 "application " | Freq: Once | CUTANEOUS | Status: AC
  Administered 2021-03-18: 2 via TOPICAL

## 2021-03-18 MED ORDER — PHENYLEPHRINE 40 MCG/ML (10ML) SYRINGE FOR IV PUSH (FOR BLOOD PRESSURE SUPPORT)
PREFILLED_SYRINGE | INTRAVENOUS | Status: AC
  Filled 2021-03-18: qty 10

## 2021-03-18 MED ORDER — EZETIMIBE 10 MG PO TABS
10.0000 mg | ORAL_TABLET | Freq: Every day | ORAL | Status: DC
  Administered 2021-03-19 – 2021-03-22 (×4): 10 mg via ORAL
  Filled 2021-03-18 (×4): qty 1

## 2021-03-18 MED ORDER — TRANEXAMIC ACID-NACL 1000-0.7 MG/100ML-% IV SOLN
1000.0000 mg | INTRAVENOUS | Status: AC
  Administered 2021-03-18: 1000 mg via INTRAVENOUS
  Filled 2021-03-18: qty 100

## 2021-03-18 MED ORDER — ONDANSETRON HCL 4 MG/2ML IJ SOLN
INTRAMUSCULAR | Status: DC | PRN
  Administered 2021-03-18: 4 mg via INTRAVENOUS

## 2021-03-18 MED ORDER — FENTANYL CITRATE PF 50 MCG/ML IJ SOSY: 25.0000 ug | PREFILLED_SYRINGE | INTRAMUSCULAR | Status: DC | PRN

## 2021-03-18 MED ORDER — HYDROCODONE-ACETAMINOPHEN 5-325 MG PO TABS: 1.0000 | ORAL_TABLET | ORAL | 0 refills | Status: DC | PRN

## 2021-03-18 MED ORDER — LIDOCAINE 2% (20 MG/ML) 5 ML SYRINGE
INTRAMUSCULAR | Status: DC | PRN
Start: 2021-03-18 — End: 2021-03-18
  Administered 2021-03-18: 40 mg via INTRAVENOUS

## 2021-03-18 MED ORDER — HYDRALAZINE HCL 25 MG PO TABS: 25.0000 mg | ORAL_TABLET | Freq: Four times a day (QID) | ORAL | Status: DC | PRN

## 2021-03-18 MED ORDER — PROPOFOL 10 MG/ML IV BOLUS
INTRAVENOUS | Status: AC
  Filled 2021-03-18: qty 20

## 2021-03-18 MED ORDER — ASPIRIN EC 325 MG PO TBEC
325.0000 mg | DELAYED_RELEASE_TABLET | Freq: Every day | ORAL | Status: DC
  Administered 2021-03-19 – 2021-03-22 (×4): 325 mg via ORAL
  Filled 2021-03-18 (×4): qty 1

## 2021-03-18 MED ORDER — ONDANSETRON HCL 4 MG/2ML IJ SOLN: 4.0000 mg | Freq: Four times a day (QID) | INTRAMUSCULAR | Status: DC | PRN

## 2021-03-18 MED ORDER — OXYBUTYNIN CHLORIDE ER 5 MG PO TB24
10.0000 mg | ORAL_TABLET | Freq: Every day | ORAL | Status: DC
  Administered 2021-03-18 – 2021-03-21 (×4): 10 mg via ORAL
  Filled 2021-03-18 (×4): qty 2

## 2021-03-18 MED ORDER — FENTANYL CITRATE PF 50 MCG/ML IJ SOSY
PREFILLED_SYRINGE | INTRAMUSCULAR | Status: AC
  Filled 2021-03-18: qty 3

## 2021-03-18 MED ORDER — 0.9 % SODIUM CHLORIDE (POUR BTL) OPTIME
TOPICAL | Status: DC | PRN
  Administered 2021-03-18: 1000 mL

## 2021-03-18 MED ORDER — ONDANSETRON HCL 4 MG/2ML IJ SOLN
INTRAMUSCULAR | Status: AC
  Filled 2021-03-18: qty 2

## 2021-03-18 MED ORDER — MENTHOL 3 MG MT LOZG: 1.0000 | LOZENGE | OROMUCOSAL | Status: DC | PRN

## 2021-03-18 MED ORDER — DOCUSATE SODIUM 100 MG PO CAPS
100.0000 mg | ORAL_CAPSULE | Freq: Two times a day (BID) | ORAL | Status: DC
  Administered 2021-03-18 – 2021-03-22 (×8): 100 mg via ORAL
  Filled 2021-03-18 (×8): qty 1

## 2021-03-18 MED ORDER — CEFAZOLIN SODIUM-DEXTROSE 2-4 GM/100ML-% IV SOLN
2.0000 g | INTRAVENOUS | Status: AC
  Administered 2021-03-18: 2 g via INTRAVENOUS
  Filled 2021-03-18: qty 100

## 2021-03-18 MED ORDER — PROPOFOL 10 MG/ML IV BOLUS
INTRAVENOUS | Status: DC | PRN
  Administered 2021-03-18: 80 mg via INTRAVENOUS

## 2021-03-18 MED ORDER — PHENYLEPHRINE 40 MCG/ML (10ML) SYRINGE FOR IV PUSH (FOR BLOOD PRESSURE SUPPORT)
PREFILLED_SYRINGE | INTRAVENOUS | Status: DC | PRN
  Administered 2021-03-18 (×3): 80 ug via INTRAVENOUS
  Administered 2021-03-18: 40 ug via INTRAVENOUS
  Administered 2021-03-18: 80 ug via INTRAVENOUS

## 2021-03-18 MED ORDER — AMISULPRIDE (ANTIEMETIC) 5 MG/2ML IV SOLN: 10.0000 mg | Freq: Once | INTRAVENOUS | Status: DC | PRN

## 2021-03-18 MED ORDER — LACTATED RINGERS IV SOLN: INTRAVENOUS | Status: DC

## 2021-03-18 MED ORDER — ACETAMINOPHEN 10 MG/ML IV SOLN
INTRAVENOUS | Status: AC
  Administered 2021-03-18: 1000 mg via INTRAVENOUS
  Filled 2021-03-18: qty 100

## 2021-03-18 MED ORDER — SUGAMMADEX SODIUM 200 MG/2ML IV SOLN
INTRAVENOUS | Status: DC | PRN
  Administered 2021-03-18: 200 mg via INTRAVENOUS

## 2021-03-18 MED ORDER — DEXAMETHASONE SODIUM PHOSPHATE 10 MG/ML IJ SOLN
INTRAMUSCULAR | Status: AC
  Filled 2021-03-18: qty 1

## 2021-03-18 MED ORDER — SODIUM CHLORIDE 0.9 % IV SOLN: INTRAVENOUS | Status: AC

## 2021-03-18 MED ORDER — ACETAMINOPHEN 325 MG PO TABS: 325.0000 mg | ORAL_TABLET | ORAL | Status: DC | PRN

## 2021-03-18 MED ORDER — ROCURONIUM BROMIDE 10 MG/ML (PF) SYRINGE
PREFILLED_SYRINGE | INTRAVENOUS | Status: DC | PRN
  Administered 2021-03-18: 40 mg via INTRAVENOUS

## 2021-03-18 MED ORDER — LEVETIRACETAM 500 MG PO TABS
500.0000 mg | ORAL_TABLET | Freq: Every day | ORAL | Status: DC
  Administered 2021-03-18 – 2021-03-21 (×4): 500 mg via ORAL
  Filled 2021-03-18 (×4): qty 1

## 2021-03-18 MED ORDER — EPHEDRINE SULFATE-NACL 50-0.9 MG/10ML-% IV SOSY
PREFILLED_SYRINGE | INTRAVENOUS | Status: DC | PRN
  Administered 2021-03-18: 5 mg via INTRAVENOUS

## 2021-03-18 MED ORDER — ACETAMINOPHEN 10 MG/ML IV SOLN: 1000.0000 mg | Freq: Once | INTRAVENOUS | Status: DC | PRN

## 2021-03-18 MED ORDER — FENTANYL CITRATE (PF) 100 MCG/2ML IJ SOLN
INTRAMUSCULAR | Status: AC
  Filled 2021-03-18: qty 2

## 2021-03-18 MED ORDER — FENTANYL CITRATE (PF) 100 MCG/2ML IJ SOLN
INTRAMUSCULAR | Status: DC | PRN
  Administered 2021-03-18: 50 ug via INTRAVENOUS
  Administered 2021-03-18 (×2): 25 ug via INTRAVENOUS

## 2021-03-18 MED ORDER — CHLORHEXIDINE GLUCONATE 4 % EX LIQD: 60.0000 mL | Freq: Once | CUTANEOUS | Status: DC

## 2021-03-18 MED ORDER — CEFAZOLIN SODIUM-DEXTROSE 2-4 GM/100ML-% IV SOLN
2.0000 g | Freq: Four times a day (QID) | INTRAVENOUS | Status: AC
  Administered 2021-03-18 (×2): 2 g via INTRAVENOUS
  Filled 2021-03-18 (×2): qty 100

## 2021-03-18 MED ORDER — LEVETIRACETAM IN NACL 500 MG/100ML IV SOLN
500.0000 mg | Freq: Once | INTRAVENOUS | Status: AC
Start: 2021-03-18 — End: 2021-03-18
  Administered 2021-03-18: 500 mg via INTRAVENOUS
  Filled 2021-03-18: qty 100

## 2021-03-18 MED ORDER — HYDROCODONE-ACETAMINOPHEN 5-325 MG PO TABS
1.0000 | ORAL_TABLET | ORAL | Status: DC | PRN
  Administered 2021-03-18 – 2021-03-22 (×10): 1 via ORAL
  Filled 2021-03-18 (×10): qty 1

## 2021-03-18 MED ORDER — PHENOL 1.4 % MT LIQD: 1.0000 | OROMUCOSAL | Status: DC | PRN

## 2021-03-18 MED ORDER — ACETAMINOPHEN 160 MG/5ML PO SOLN: 325.0000 mg | ORAL | Status: DC | PRN

## 2021-03-18 MED ORDER — METOCLOPRAMIDE HCL 5 MG/ML IJ SOLN: 5.0000 mg | Freq: Three times a day (TID) | INTRAMUSCULAR | Status: DC | PRN

## 2021-03-18 MED ORDER — DEXAMETHASONE SODIUM PHOSPHATE 10 MG/ML IJ SOLN
INTRAMUSCULAR | Status: DC | PRN
  Administered 2021-03-18: 4 mg via INTRAVENOUS

## 2021-03-18 MED ORDER — PHENYLEPHRINE HCL-NACL 20-0.9 MG/250ML-% IV SOLN
INTRAVENOUS | Status: DC | PRN
  Administered 2021-03-18: 40 ug/min via INTRAVENOUS

## 2021-03-18 MED ORDER — LIDOCAINE HCL (PF) 2 % IJ SOLN
INTRAMUSCULAR | Status: AC
  Filled 2021-03-18: qty 5

## 2021-03-18 MED ORDER — METHOCARBAMOL 500 MG PO TABS
500.0000 mg | ORAL_TABLET | Freq: Four times a day (QID) | ORAL | Status: DC | PRN
  Administered 2021-03-19 – 2021-03-20 (×3): 500 mg via ORAL
  Filled 2021-03-18 (×3): qty 1

## 2021-03-18 SURGICAL SUPPLY — 34 items
BAG COUNTER SPONGE SURGICOUNT (BAG) IMPLANT
BAG ZIPLOCK 12X15 (MISCELLANEOUS) IMPLANT
BIT DRILL CALIBRATED 4.2 (BIT) ×1 IMPLANT
CHLORAPREP W/TINT 26 (MISCELLANEOUS) ×2 IMPLANT
COVER PERINEAL POST (MISCELLANEOUS) ×2 IMPLANT
COVER SURGICAL LIGHT HANDLE (MISCELLANEOUS) ×2 IMPLANT
DRAPE C-ARM 42X120 X-RAY (DRAPES) ×2 IMPLANT
DRAPE INCISE IOBAN 66X45 STRL (DRAPES) ×2 IMPLANT
DRAPE ORTHO SPLIT 77X108 STRL (DRAPES)
DRAPE STERI IOBAN 125X83 (DRAPES) ×2 IMPLANT
DRAPE SURG 17X11 SM STRL (DRAPES) IMPLANT
DRAPE SURG ORHT 6 SPLT 77X108 (DRAPES) IMPLANT
DRILL BIT CALIBRATED 4.2 (BIT) ×2
DRSG ADAPTIC 3X8 NADH LF (GAUZE/BANDAGES/DRESSINGS) ×2 IMPLANT
ELECT REM PT RETURN 15FT ADLT (MISCELLANEOUS) ×2 IMPLANT
GAUZE SPONGE 4X4 12PLY STRL (GAUZE/BANDAGES/DRESSINGS) ×2 IMPLANT
GAUZE XEROFORM 1X8 LF (GAUZE/BANDAGES/DRESSINGS) ×2 IMPLANT
GLOVE SURG ENC MOIS LTX SZ7.5 (GLOVE) ×4 IMPLANT
GLOVE SURG UNDER LTX SZ8 (GLOVE) ×4 IMPLANT
GOWN STRL REUS W/TWL LRG LVL3 (GOWN DISPOSABLE) ×4 IMPLANT
GUIDEWIRE 3.2X400 (WIRE) ×4 IMPLANT
KIT BASIN OR (CUSTOM PROCEDURE TRAY) ×2 IMPLANT
KIT TURNOVER KIT A (KITS) IMPLANT
MANIFOLD NEPTUNE II (INSTRUMENTS) ×2 IMPLANT
NAIL IM TFNA 9X235 130D RT (Nail) ×2 IMPLANT
PACK GENERAL/GYN (CUSTOM PROCEDURE TRAY) ×2 IMPLANT
PROTECTOR NERVE ULNAR (MISCELLANEOUS) ×2 IMPLANT
SCREW FENS TFNA 85 (Screw) ×2 IMPLANT
SCREW LOCK STAR 5X34 (Screw) ×2 IMPLANT
STAPLER VISISTAT 35W (STAPLE) ×2 IMPLANT
SUT VIC AB 0 CT1 36 (SUTURE) ×2 IMPLANT
SUT VIC AB 2-0 CT1 27 (SUTURE) ×2
SUT VIC AB 2-0 CT1 TAPERPNT 27 (SUTURE) ×1 IMPLANT
TOWEL OR 17X26 10 PK STRL BLUE (TOWEL DISPOSABLE) ×2 IMPLANT

## 2021-03-18 NOTE — Anesthesia Postprocedure Evaluation (Signed)
Anesthesia Post Note  Patient: [name]  Procedure(s) Performed: INTRAMEDULLARY (IM) NAIL FEMORAL (Right: Hip)     Patient location during evaluation: PACU Anesthesia Type: General Level of consciousness: awake and alert Pain management: pain level controlled Vital Signs Assessment: post-procedure vital signs reviewed and stable Respiratory status: spontaneous breathing, nonlabored ventilation, respiratory function stable and patient connected to nasal cannula oxygen Cardiovascular status: blood pressure returned to baseline and stable Postop Assessment: no apparent nausea or vomiting Anesthetic complications: no   No notable events documented.  Last Vitals:  Vitals:   03/18/21 1200 03/18/21 1232  BP: (!) 127/52 134/61  Pulse: 83 64  Resp:  16  Temp:    SpO2: 100% 99%    Last Pain:  Vitals:   03/18/21 1200  TempSrc:   PainSc: 0-No pain                 Effie Berkshire

## 2021-03-18 NOTE — Consult Note (Signed)
ORTHOPAEDIC CONSULTATION  REQUESTING PHYSICIAN: Flora Lipps, MD  PCP:  Bernerd Limbo, MD  Chief Complaint: Right hip pain  HPI: [name] is a 87 y.o. female who complains of right hip pain following a ground-level fall.  She had a fall in her normal state of health.  She does live independently at baseline with her son.  She noticed immediate pain in the right leg and inability to bear weight.  She presented to [facility] last evening.  Dr. Wynelle Link was on call for our group and was notified of right intertrochanteric hip fracture.  I was consulted this morning for definitive treatment with intramedullary nailing.  She denies numbness or tingling.  She does take a baby aspirin at baseline but no other chronic anticoagulants.  Multiple medical comorbidities listed in the medical record. Past Medical History:  Diagnosis Date   Abdominal pain, other specified site    Allergic rhinitis    Anemia    Arthritis    CAD (coronary artery disease) 12/2017   s/p stent   Chronic kidney disease    stage 3 kidney disease per patient on 1/74/08   Diastolic CHF ([place]) 14/4818   grade 1   Essential hypertension, benign 06/17/2018   History of non-ST elevation myocardial infarction (NSTEMI) 06/17/2018   HLD (hyperlipidemia)    Hypertension    Irregular heart rate    Ischemic cardiomyopathy 06/17/2018   Myocardial infarction ([place]) 12/2017    [facility]   OSA (obstructive sleep apnea)    uses adaptive servo ventilation machine at night   Pneumonia    Seizure disorder ([place])    last seizure in the 1950s-well controlled with keppra   Skin cancer    left arm   Past Surgical History:  Procedure Laterality Date   BACK SURGERY     CARDIAC CATHETERIZATION  02/09/2000   CHOLECYSTECTOMY     EYE SURGERY Bilateral    cataracts removed   HARDWARE REMOVAL Right 05/14/2020   Procedure: HARDWARE REMOVAL RIGHT FEMUR;  Surgeon: Shona Needles, MD;  Location: [place];  Service:  Orthopedics;  Laterality: Right;   KNEE SURGERY     laparoscopic   ORIF FEMUR FRACTURE Right 12/15/2019   Procedure: OPEN REDUCTION INTERNAL FIXATION (ORIF) DISTAL FEMUR FRACTURE;  Surgeon: Shona Needles, MD;  Location: [place];  Service: Orthopedics;  Laterality: Right;   TOTAL ABDOMINAL HYSTERECTOMY     TOTAL KNEE ARTHROPLASTY Right 11/15/2020   Procedure: TOTAL KNEE ARTHROPLASTY;  Surgeon: Gaynelle Arabian, MD;  Location: [facility];  Service: Orthopedics;  Laterality: Right;  86min   WRIST FRACTURE SURGERY Bilateral    x 2   Social History   Socioeconomic History   Marital status: Widowed    Spouse name: Not on file   Number of children: 1   Years of education: Not on file   Highest education level: Not on file  Occupational History   Occupation: retired from [employer] Use   Smoking status: Never   Smokeless tobacco: Never  Vaping Use   Vaping Use: Never used  Substance and Sexual Activity   Alcohol use: No   Drug use: No   Sexual activity: Not Currently    Birth control/protection: Surgical    Comment: Hysterectomy  Other Topics Concern   Not on file  Social History Narrative   Not on file   Social Determinants of Health   Financial Resource Strain: Not on file  Food Insecurity: Not  on file  Transportation Needs: Not on file  Physical Activity: Not on file  Stress: Not on file  Social Connections: Not on file   Family History  Problem Relation Age of Onset   Heart disease Father    Skin cancer Father    Skin cancer Mother    Heart attack Mother    Stroke Sister    Congenital heart disease Niece    Renal Disease Niece    Allergies  Allergen Reactions   Codeine Other (See Comments) and Hypertension    Causes a sensation of "pressure" within her head   Latex Itching   Tape Itching   Prior to Admission medications   Medication Sig Start Date End Date Taking? Authorizing Provider  acetaminophen (TYLENOL) 325 MG tablet Take 650 mg by mouth every  6 (six) hours as needed for moderate pain.   Yes [provider]  amLODipine (NORVASC) 10 MG tablet Take 1 tablet (10 mg total) by mouth daily. 01/15/20  Yes Medina-Vargas, Monina C, NP  aspirin EC 81 MG tablet Take 81 mg by mouth daily. Swallow whole.   Yes [provider]  diphenoxylate-atropine (LOMOTIL) 2.5-0.025 MG tablet Take 1 tablet by mouth 4 (four) times daily as needed for diarrhea or loose stools. 01/15/20  Yes Medina-Vargas, Monina C, NP  ezetimibe (ZETIA) 10 MG tablet Take 1 tablet (10 mg total) by mouth daily. 09/15/20  Yes Patwardhan, Manish J, MD  fluocinonide ointment (LIDEX) 3.81 % Apply 1 application topically 2 (two) times daily as needed (dermatitis). 07/14/20  Yes [provider]  levETIRAcetam (KEPPRA) 500 MG tablet Take 1 tablet (500 mg total) by mouth 2 (two) times daily. Patient taking differently: Take 500 mg by mouth at bedtime. 01/15/20  Yes Medina-Vargas, Monina C, NP  nitroGLYCERIN (NITROSTAT) 0.4 MG SL tablet Place 1 tablet (0.4 mg total) under the tongue every 5 (five) minutes as needed for chest pain. 01/15/20  Yes Medina-Vargas, Monina C, NP  oxybutynin (DITROPAN-XL) 10 MG 24 hr tablet Take 10 mg by mouth at bedtime. 03/12/20  Yes [provider]  traMADol (ULTRAM) 50 MG tablet Take 1 tablet (50 mg total) by mouth every 6 (six) hours as needed for severe pain. 05/14/20  Yes [name], PA-C  VITAMIN D PO Take 1,000 mg by mouth daily at 6 (six) AM.   Yes [provider]  olmesartan (BENICAR) 20 MG tablet Take 1 tablet (20 mg total) by mouth daily. Patient not taking: Reported on 02/02/2021 01/15/20 02/02/21  Nickola Major, NP   DG Chest 1 View  Result Date: 03/17/2021 CLINICAL DATA:  Status post fall EXAM: CHEST  1 VIEW COMPARISON:  Chest x-ray 12/14/2019 FINDINGS: The heart and mediastinal contours are unchanged. Aortic calcification. No focal consolidation. Persistent coarsened interstitial markings with no  overt pulmonary edema. No pleural effusion. No pneumothorax. No acute osseous abnormality. IMPRESSION: No active disease. Electronically Signed   By: Iven Finn M.D.   On: 03/17/2021 22:22   DG Pelvis 1-2 Views  Result Date: 03/17/2021 CLINICAL DATA:  Status post fall EXAM: PELVIS - 1-2 VIEW; RIGHT FEMUR 2 VIEWS COMPARISON:  X-ray right femur 12/15/2019, x-ray right hip 12/14/2019 FINDINGS: Pelvis: Markedly limited evaluation due to overlapping osseous structures and overlying soft tissues. There is no evidence of pelvic fracture or diastasis. No pelvic bone lesions are seen. Frontal view of the left hip grossly unremarkable. Right femur: Acute intertrochanteric and proximal femoral shaft fracture. Partially visualized total right knee arthroplasty. Femoral  shaft markings suggestive of removal of prior surgical hardware. Vascular calcifications. IMPRESSION: 1. Acute intertrochanteric and proximal femoral shaft fracture. 2. No acute displaced fracture or diastasis of the bones of the pelvis. Markedly limited evaluation due to overlapping osseous structures and overlying soft tissues. 3. Frontal view of the left hip negative for acute traumatic injury. 4. Total right knee arthroplasty. Electronically Signed   By: Iven Finn M.D.   On: 03/17/2021 22:25   DG FEMUR, MIN 2 VIEWS RIGHT  Result Date: 03/17/2021 CLINICAL DATA:  Status post fall EXAM: PELVIS - 1-2 VIEW; RIGHT FEMUR 2 VIEWS COMPARISON:  X-ray right femur 12/15/2019, x-ray right hip 12/14/2019 FINDINGS: Pelvis: Markedly limited evaluation due to overlapping osseous structures and overlying soft tissues. There is no evidence of pelvic fracture or diastasis. No pelvic bone lesions are seen. Frontal view of the left hip grossly unremarkable. Right femur: Acute intertrochanteric and proximal femoral shaft fracture. Partially visualized total right knee arthroplasty. Femoral shaft markings suggestive of removal of prior surgical hardware.  Vascular calcifications. IMPRESSION: 1. Acute intertrochanteric and proximal femoral shaft fracture. 2. No acute displaced fracture or diastasis of the bones of the pelvis. Markedly limited evaluation due to overlapping osseous structures and overlying soft tissues. 3. Frontal view of the left hip negative for acute traumatic injury. 4. Total right knee arthroplasty. Electronically Signed   By: Iven Finn M.D.   On: 03/17/2021 22:25    Positive ROS: All other systems have been reviewed and were otherwise negative with the exception of those mentioned in the HPI and as above.  Physical Exam: General: Alert, no acute distress Cardiovascular: No pedal edema Respiratory: No cyanosis, no use of accessory musculature GI: No organomegaly, abdomen is soft and non-tender Skin: No lesions in the area of chief complaint Neurologic: Sensation intact distally Psychiatric: Patient is competent for consent with normal mood and affect Lymphatic: No axillary or cervical lymphadenopathy  MUSCULOSKELETAL:  Right lower extremity is externally rotated and shortened.  Otherwise distally neurovascular intact with mild 1+ pedal edema symmetric bilateral.  Assessment: Right hip intertrochanteric fracture with some subcu extension  Plan: -Plan for intramedullary nail today.  She can continue her baby aspirin postoperatively as well.  No need to delay surgery regarding that.  - The risks, benefits, and alternatives were discussed with the patient. There are risks associated with the surgery including, but not limited to, problems with anesthesia (death), infection, differences in leg length/angulation/rotation, fracture of bones, loosening or failure of implants, malunion, nonunion, hematoma (blood accumulation) which may require surgical drainage, blood clots, pulmonary embolism, nerve injury (foot drop), and blood vessel injury. The patient understands these risks and elects to proceed.   -Admit to the medicine  service postoperatively for perioperative care and disposition planning.  Nicholes Stairs, MD Cell [phone]    03/18/2021 9:09 AM

## 2021-03-18 NOTE — ED Notes (Signed)
[phone]  

## 2021-03-18 NOTE — Anesthesia Preprocedure Evaluation (Addendum)
Anesthesia Evaluation  Patient identified by MRN, date of birth, ID band Patient awake    Reviewed: Allergy & Precautions, NPO status , Patient's Chart, lab work & pertinent test results  Airway Mallampati: II  TM Distance: >3 FB Neck ROM: Full    Dental  (+) Missing,    Pulmonary sleep apnea ,     + decreased breath sounds      Cardiovascular hypertension, + CAD, + Past MI, + Cardiac Stents and +CHF   Rhythm:Regular Rate:Normal     Neuro/Psych Seizures -, Well Controlled,  negative psych ROS   GI/Hepatic negative GI ROS, Neg liver ROS,   Endo/Other  negative endocrine ROS  Renal/GU CRFRenal disease     Musculoskeletal  (+) Arthritis ,   Abdominal Normal abdominal exam  (+)   Peds  Hematology negative hematology ROS (+)   Anesthesia Other Findings   Reproductive/Obstetrics                            Anesthesia Physical Anesthesia Plan  ASA: 3  Anesthesia Plan: General   Post-op Pain Management:    Induction: Intravenous  PONV Risk Score and Plan: Ondansetron  Airway Management Planned: Oral ETT  Additional Equipment: None  Intra-op Plan:   Post-operative Plan: Extubation in OR  Informed Consent: I have reviewed the patients History and Physical, chart, labs and discussed the procedure including the risks, benefits and alternatives for the proposed anesthesia with the patient or authorized representative who has indicated his/her understanding and acceptance.   Patient has DNR.  Discussed DNR with patient and Suspend DNR.   Dental advisory given  Plan Discussed with: CRNA  Anesthesia Plan Comments:        Anesthesia Quick Evaluation

## 2021-03-18 NOTE — Progress Notes (Signed)
PROGRESS NOTE  [name] DXI:[id number] DOB: 1929-06-15 DOA: 03/17/2021 PCP: Bernerd Limbo, MD   LOS: 1 day   Brief narrative: [name] is a 87 y.o. female with past medical history significant for hypertension, hyperlipidemia presented to hospital after sustaining a fall and right hip pain.  Patient was noted to have a right acute intertrochanteric hip fracture.  There was no mention of a loss of consciousness.  2D echocardiogram in January 2020 was notable for diastolic parameters that were found to be within normal limits.  She is not on a diuretic medications at home.  Initial vitals were stable.  Labs were within normal limits including COVID and influenza.  EKG showed sinus rhythm.  Chest x-ray was unremarkable.  Patient was then admitted hospital for further evaluation and treatment.  Patient is currently awaiting for transfer to [facility] long hospital for definitive treatment.  Assessment/Plan:  Principal Problem:   Closed right hip fracture [facility]) Active Problems:   Essential hypertension   Seizure disorder (HCC)   Dehydration   Acute prerenal azotemia   Fall at home, initial encounter   HLD (hyperlipidemia)   Acute right intertrochanteric femur fracture:  Currently NPO.  Awaiting for surgical intervention.  Continue IV morphine for pain.  Splint on hold.  Foley catheter has been placed in.  Awaiting for transfer to [facility] long hospital for definitive treatment.   Ground level mechanical fall:  Without loss of consciousness.  Fall precautions.  Awaiting for surgical intervention.  Will need physical therapy evaluation after surgery.  Previous 2D echocardiogram from 2021 with preserved LV function.   Volume depletion/dehydration. Received IV fluids.  Improving.  Mild thrombocytopenia.  We will continue to monitor closely.  No evidence of bleeding.  Mild anemia with hemoglobin of 10.1 on presentation.  8.8 at this time.  Received IV fluids.  Could be  hemodilution.    Essential Hypertension: Patient is on Norvasc, olmesartan.  Currently on hold due to n.p.o. status.  Hyperlipidemia: On Zetia at home.  Restart when p.o. is okay.   Seizure disorder: On IV Keppra.  Patient takes her oral Keppra at home.    DVT prophylaxis: SCDs Start: 03/17/21 2332   Code Status: DNR  Family Communication: Spoke with the patient's daughter at bedside  Status is: Inpatient  Remains inpatient appropriate because: Hip fracture needing surgery  Consultants: Orthopedics  Procedures: None yet  Anti-infectives:  None  Anti-infectives (From admission, onward)    None      Subjective: Today, patient was seen and examined at bedside.  Patient complains of mild hip pain and had received analgesics this morning.  Patient's daughter at bedside.  Objective: Vitals:   03/18/21 0800 03/18/21 0830  BP: (!) 120/54 (!) 132/57  Pulse: 69 72  Resp: 16 (!) 9  Temp:    SpO2: 100% 97%   No intake or output data in the 24 hours ending 03/18/21 0904 Filed Weights   03/17/21 2101  Weight: 63.5 kg   Body mass index is 25.61 kg/m.   Physical Exam: GENERAL: Patient is alert awake and communicative.  Not in obvious distress. HENT: No scleral pallor or icterus. Pupils equally reactive to light. Oral mucosa is moist NECK: is supple, no gross swelling noted. CHEST: Clear to auscultation. No crackles or wheezes.  Diminished breath sounds bilaterally. CVS: S1 and S2 heard, no murmur. Regular rate and rhythm.  ABDOMEN: Soft, non-tender, bowel sounds are present. EXTREMITIES: No edema.  Right hip externally rotated and shortened. CNS:  Cranial nerves are intact. No focal motor deficits. SKIN: warm and dry without rashes.  Data Review: I have personally reviewed the following laboratory data and studies,  CBC: Recent Labs  Lab 03/17/21 2109 03/18/21 0342  WBC 6.6 7.0  NEUTROABS 4.8  --   HGB 10.1* 8.8*  HCT 31.7* 28.1*  MCV 95.2 97.2  PLT 129*  784*   Basic Metabolic Panel: Recent Labs  Lab 03/17/21 2109 03/18/21 0342  NA 139 137  K 3.8 4.2  CL 108 107  CO2 23 25  GLUCOSE 138* 121*  BUN 20 17  CREATININE 0.77 0.73  CALCIUM 9.1 8.5*  MG  --  1.7   Liver Function Tests: Recent Labs  Lab 03/18/21 0342  AST 25  ALT 15  ALKPHOS 85  BILITOT 0.5  PROT 5.5*  ALBUMIN 3.1*   No results for input(s): LIPASE, AMYLASE in the last 168 hours. No results for input(s): AMMONIA in the last 168 hours. Cardiac Enzymes: No results for input(s): CKTOTAL, CKMB, CKMBINDEX, TROPONINI in the last 168 hours. BNP (last 3 results) No results for input(s): BNP in the last 8760 hours.  ProBNP (last 3 results) No results for input(s): PROBNP in the last 8760 hours.  CBG: No results for input(s): GLUCAP in the last 168 hours. Recent Results (from the past 240 hour(s))  Resp Panel by RT-PCR (Flu A&B, Covid) Nasopharyngeal Swab     Status: None   Collection Time: 03/17/21  9:09 PM   Specimen: Nasopharyngeal Swab; Nasopharyngeal(NP) swabs in vial transport medium  Result Value Ref Range Status   SARS Coronavirus 2 by RT PCR NEGATIVE NEGATIVE Final    Comment: (NOTE) SARS-CoV-2 target nucleic acids are NOT DETECTED.  The SARS-CoV-2 RNA is generally detectable in upper respiratory specimens during the acute phase of infection. The lowest concentration of SARS-CoV-2 viral copies this assay can detect is 138 copies/mL. A negative result does not preclude SARS-Cov-2 infection and should not be used as the sole basis for treatment or other patient management decisions. A negative result may occur with  improper specimen collection/handling, submission of specimen other than nasopharyngeal swab, presence of viral mutation(s) within the areas targeted by this assay, and inadequate number of viral copies(<138 copies/mL). A negative result must be combined with clinical observations, patient history, and epidemiological information. The  expected result is Negative.  Fact Sheet for Patients:  EntrepreneurPulse.com.au  Fact Sheet for Healthcare Providers:  IncredibleEmployment.be  This test is no t yet approved or cleared by the [place] FDA and  has been authorized for detection and/or diagnosis of SARS-CoV-2 by FDA under an Emergency Use Authorization (EUA). This EUA will remain  in effect (meaning this test can be used) for the duration of the COVID-19 declaration under Section 564(b)(1) of the Act, 21 U.S.C.section 360bbb-3(b)(1), unless the authorization is terminated  or revoked sooner.       Influenza A by PCR NEGATIVE NEGATIVE Final   Influenza B by PCR NEGATIVE NEGATIVE Final    Comment: (NOTE) The Xpert Xpress SARS-CoV-2/FLU/RSV plus assay is intended as an aid in the diagnosis of influenza from Nasopharyngeal swab specimens and should not be used as a sole basis for treatment. Nasal washings and aspirates are unacceptable for Xpert Xpress SARS-CoV-2/FLU/RSV testing.  Fact Sheet for Patients: EntrepreneurPulse.com.au  Fact Sheet for Healthcare Providers: IncredibleEmployment.be  This test is not yet approved or cleared by the [place] FDA and has been authorized for detection and/or diagnosis of SARS-CoV-2 by FDA under  an Emergency Use Authorization (EUA). This EUA will remain in effect (meaning this test can be used) for the duration of the COVID-19 declaration under Section 564(b)(1) of the Act, 21 U.S.C. section 360bbb-3(b)(1), unless the authorization is terminated or revoked.  Performed at [facility], [place] [address] [place], [place] [address]      Studies: DG Chest 1 View  Result Date: 03/17/2021 CLINICAL DATA:  Status post fall EXAM: CHEST  1 VIEW COMPARISON:  Chest x-ray 12/14/2019 FINDINGS: The heart and mediastinal contours are unchanged. Aortic calcification. No focal consolidation. Persistent  coarsened interstitial markings with no overt pulmonary edema. No pleural effusion. No pneumothorax. No acute osseous abnormality. IMPRESSION: No active disease. Electronically Signed   By: Iven Finn M.D.   On: 03/17/2021 22:22   DG Pelvis 1-2 Views  Result Date: 03/17/2021 CLINICAL DATA:  Status post fall EXAM: PELVIS - 1-2 VIEW; RIGHT FEMUR 2 VIEWS COMPARISON:  X-ray right femur 12/15/2019, x-ray right hip 12/14/2019 FINDINGS: Pelvis: Markedly limited evaluation due to overlapping osseous structures and overlying soft tissues. There is no evidence of pelvic fracture or diastasis. No pelvic bone lesions are seen. Frontal view of the left hip grossly unremarkable. Right femur: Acute intertrochanteric and proximal femoral shaft fracture. Partially visualized total right knee arthroplasty. Femoral shaft markings suggestive of removal of prior surgical hardware. Vascular calcifications. IMPRESSION: 1. Acute intertrochanteric and proximal femoral shaft fracture. 2. No acute displaced fracture or diastasis of the bones of the pelvis. Markedly limited evaluation due to overlapping osseous structures and overlying soft tissues. 3. Frontal view of the left hip negative for acute traumatic injury. 4. Total right knee arthroplasty. Electronically Signed   By: Iven Finn M.D.   On: 03/17/2021 22:25   DG FEMUR, MIN 2 VIEWS RIGHT  Result Date: 03/17/2021 CLINICAL DATA:  Status post fall EXAM: PELVIS - 1-2 VIEW; RIGHT FEMUR 2 VIEWS COMPARISON:  X-ray right femur 12/15/2019, x-ray right hip 12/14/2019 FINDINGS: Pelvis: Markedly limited evaluation due to overlapping osseous structures and overlying soft tissues. There is no evidence of pelvic fracture or diastasis. No pelvic bone lesions are seen. Frontal view of the left hip grossly unremarkable. Right femur: Acute intertrochanteric and proximal femoral shaft fracture. Partially visualized total right knee arthroplasty. Femoral shaft markings suggestive of  removal of prior surgical hardware. Vascular calcifications. IMPRESSION: 1. Acute intertrochanteric and proximal femoral shaft fracture. 2. No acute displaced fracture or diastasis of the bones of the pelvis. Markedly limited evaluation due to overlapping osseous structures and overlying soft tissues. 3. Frontal view of the left hip negative for acute traumatic injury. 4. Total right knee arthroplasty. Electronically Signed   By: Iven Finn M.D.   On: 03/17/2021 22:25      Flora Lipps, MD  [facility] 03/18/2021  If 7PM-7AM, please contact night-coverage

## 2021-03-18 NOTE — Discharge Instructions (Addendum)
Orthopedic surgery postop instructions:  -Okay for weightbearing as tolerated with assistive device such as walker.  -Maintain postoperative bandage until follow-up appointment in 2 weeks.  If this become saturated or soiled you should replace this with a daily dry dressing.  -You may begin showering on postoperative day #2.  Do not submerge incisions underwater.  -You will resume your preoperative 81 mg aspirin daily for DVT prophylaxis.  -For mild to moderate pain use Tylenol and Advil around-the-clock.  For breakthrough pain use Norco as necessary.Do not exceed 4000mg  of tylenol (acetaminophen) per day.   -Follow-up with Dr. Stann Mainland or Jonelle Sidle PA-C in 2 weeks for routine postoperative care and x-rays. Call office at [phone] to make an appointment.

## 2021-03-18 NOTE — ED Notes (Signed)
[facility] has arrived for pt.

## 2021-03-18 NOTE — Op Note (Signed)
Date of Surgery: 03/18/2021  INDICATIONS: [name] is a 87 y.o.-year-old female who sustained a right hip fracture. The risks and benefits of the procedure discussed with the patient and Patient prior to the procedure and all questions were answered; consent was obtained.  PREOPERATIVE DIAGNOSIS: right hip fracture   POSTOPERATIVE DIAGNOSIS: Same   PROCEDURE: Treatment of intertrochanteric, pertrochanteric, subtrochanteric fracture with intramedullary implant. CPT [phone]   SURGEON: Dannielle Karvonen. Stann Mainland, M.D.   Assistant:  Jonelle Sidle, PA-C  Assistant attestation: PA Mcclung was present for the entire procedure.  He participated in all critical portions.  ANESTHESIA: general   IV FLUIDS AND URINE: See anesthesia record   ESTIMATED BLOOD LOSS: 50 cc  IMPLANTS: Synthes TFN a A 9 mm x 235 mm nail 85 mm proximal compression screw 5 mm x 34 mm distal interlock  DRAINS: None.   COMPLICATIONS: None.   DESCRIPTION OF PROCEDURE: The patient was brought to the operating room and placed supine on the operating table. The patient's leg had been signed prior to the procedure. The patient had the anesthesia placed by the anesthesiologist. The prep verification and incision time-outs were performed to confirm that this was the correct patient, site, side and location. The patient had an SCD on the opposite lower extremity. The patient did receive antibiotics prior to the incision and was re-dosed during the procedure as needed at indicated intervals. The patient was positioned on the fracture table with the table in traction and internal rotation to reduce the hip. The well leg was placed in a scissor position and all bony prominences were well-padded. The patient had the lower extremity prepped and draped in the standard surgical fashion. The incision was made 4 finger breadths superior to the greater trochanter. A guide pin was inserted into the tip of the greater trochanter under fluoroscopic  guidance. An opening reamer was used to gain access to the femoral canal. The nail length was measured and inserted down the femoral canal to its proper depth. The appropriate version of insertion for the lag screw was found under fluoroscopy. A pin was inserted up the femoral neck through the jig. The length of the lag screw was then measured. The lag screw was inserted as near to center-center in the head as possible. The leg was taken out of traction, then the compression screw was used to compress across the fracture. Compression was visualized on serial xrays.   We next turned our attention to the distal interlocking screw.  This was placed through the drill guide of the nail inserter.  A small incision was made overlying the lateral thigh at the screw site, and a tonsil was used to disect down to bone.  A drill pass was made through the jig and across the nail through both cortices.  This was measured, and the appropriate screw was placed under hand power and found to have good bite.    The wound was copiously irrigated with saline and the subcutaneous layer closed with 2.0 vicryl and the skin was reapproximated with staples. The wounds were cleaned and dried a final time and a sterile dressing was placed. The hip was taken through a range of motion at the end of the case under fluoroscopic imaging to visualize the approach-withdraw phenomenon and confirm implant length in the head. The patient was then awakened from anesthesia and taken to the recovery room in stable condition. All counts were correct at the end of the case.   POSTOPERATIVE PLAN:  The patient will be weight bearing as tolerated with assistive device and will return in 2 weeks for staple removal and the patient will receive DVT prophylaxis based on other medications, activity level, and risk ratio of bleeding to thrombosis.  She will resume her preoperative aspirin here in the hospital which will serve as her DVT prophylaxis as well as  sequential compression devices.   Geralynn Rile, MD [facility] [place] [phone] 11:08 AM

## 2021-03-18 NOTE — Transfer of Care (Signed)
Immediate Anesthesia Transfer of Care Note  Patient: [name]  Procedure(s) Performed: INTRAMEDULLARY (IM) NAIL FEMORAL (Right: Hip)  Patient Location: PACU  Anesthesia Type:General  Level of Consciousness: awake, drowsy and patient cooperative  Airway & Oxygen Therapy: Patient Spontanous Breathing and Patient connected to face mask oxygen  Post-op Assessment: Report given to RN and Post -op Vital signs reviewed and stable  Post vital signs: Reviewed and stable  Last Vitals:  Vitals Value Taken Time  BP    Temp    Pulse    Resp    SpO2      Last Pain:  Vitals:   03/18/21 0847  TempSrc:   PainSc: 5          Complications: No notable events documented.

## 2021-03-18 NOTE — Progress Notes (Signed)
Pt arrived in room 1345 via EMS. Was taken to surgery right away.  Daughter was at bedside before the patent left the room.

## 2021-03-18 NOTE — Brief Op Note (Signed)
03/17/2021 - 03/18/2021  11:06 AM  PATIENT:  Rise Paganini A [place]emark  87 y.o. female  PRE-OPERATIVE DIAGNOSIS:  right hip intertrochanteric fracture  POST-OPERATIVE DIAGNOSIS:  right hip intertrochanteric fracture  PROCEDURE:  Procedure(s): INTRAMEDULLARY (IM) NAIL FEMORAL (Right)  SURGEON:  Surgeon(s) and Role:    * Nicholes Stairs, MD - Primary  PHYSICIAN ASSISTANT:   ASSISTANTS: Jonelle Sidle, PA-C   ANESTHESIA:   general  EBL:  50 cc   BLOOD ADMINISTERED:none  DRAINS: none   LOCAL MEDICATIONS USED:  NONE  SPECIMEN:  No Specimen  DISPOSITION OF SPECIMEN:  N/A  COUNTS:  YES  TOURNIQUET:  * No tourniquets in log *  DICTATION: .Note written in EPIC  PLAN OF CARE: Admit to inpatient   PATIENT DISPOSITION:  PACU - hemodynamically stable.   Delay start of Pharmacological VTE agent (>24hrs) due to surgical blood loss or risk of bleeding: not applicable

## 2021-03-18 NOTE — Anesthesia Procedure Notes (Signed)
Procedure Name: Intubation Date/Time: 03/18/2021 10:16 AM Performed by: West Pugh, CRNA Pre-anesthesia Checklist: Patient identified, Emergency Drugs available, Suction available, Patient being monitored and Timeout performed Patient Re-evaluated:Patient Re-evaluated prior to induction Oxygen Delivery Method: Circle system utilized Preoxygenation: Pre-oxygenation with 100% oxygen Induction Type: IV induction Ventilation: Mask ventilation without difficulty Laryngoscope Size: Mac and 3 Grade View: Grade I Tube type: Oral Tube size: 7.0 mm Number of attempts: 1 Airway Equipment and Method: Stylet Placement Confirmation: ETT inserted through vocal cords under direct vision, positive ETCO2, CO2 detector and breath sounds checked- equal and bilateral Secured at: 21 cm Tube secured with: Tape Dental Injury: Teeth and Oropharynx as per pre-operative assessment

## 2021-03-19 DIAGNOSIS — D6481 Anemia due to antineoplastic chemotherapy: Secondary | ICD-10-CM

## 2021-03-19 DIAGNOSIS — S72001A Fracture of unspecified part of neck of right femur, initial encounter for closed fracture: Secondary | ICD-10-CM | POA: Diagnosis not present

## 2021-03-19 DIAGNOSIS — E860 Dehydration: Secondary | ICD-10-CM | POA: Diagnosis not present

## 2021-03-19 DIAGNOSIS — T451X5A Adverse effect of antineoplastic and immunosuppressive drugs, initial encounter: Secondary | ICD-10-CM

## 2021-03-19 DIAGNOSIS — I10 Essential (primary) hypertension: Secondary | ICD-10-CM | POA: Diagnosis not present

## 2021-03-19 DIAGNOSIS — N19 Unspecified kidney failure: Secondary | ICD-10-CM | POA: Diagnosis not present

## 2021-03-19 LAB — CBC
HCT: 24 % — ABNORMAL LOW (ref 36.0–46.0)
Hemoglobin: 7.5 g/dL — ABNORMAL LOW (ref 12.0–15.0)
MCH: 30.2 pg (ref 26.0–34.0)
MCHC: 31.3 g/dL (ref 30.0–36.0)
MCV: 96.8 fL (ref 80.0–100.0)
Platelets: 104 10*3/uL — ABNORMAL LOW (ref 150–400)
RBC: 2.48 MIL/uL — ABNORMAL LOW (ref 3.87–5.11)
RDW: 13.9 % (ref 11.5–15.5)
WBC: 6.4 10*3/uL (ref 4.0–10.5)
nRBC: 0 % (ref 0.0–0.2)

## 2021-03-19 LAB — RETICULOCYTES
Immature Retic Fract: 8.4 % (ref 2.3–15.9)
RBC.: 2.47 MIL/uL — ABNORMAL LOW (ref 3.87–5.11)
Retic Count, Absolute: 42.7 10*3/uL (ref 19.0–186.0)
Retic Ct Pct: 1.7 % (ref 0.4–3.1)

## 2021-03-19 LAB — IRON AND TIBC
Iron: 19 ug/dL — ABNORMAL LOW (ref 28–170)
Saturation Ratios: 8 % — ABNORMAL LOW (ref 10.4–31.8)
TIBC: 230 ug/dL — ABNORMAL LOW (ref 250–450)
UIBC: 211 ug/dL

## 2021-03-19 LAB — BASIC METABOLIC PANEL
Anion gap: 7 (ref 5–15)
BUN: 20 mg/dL (ref 8–23)
CO2: 26 mmol/L (ref 22–32)
Calcium: 8.3 mg/dL — ABNORMAL LOW (ref 8.9–10.3)
Chloride: 101 mmol/L (ref 98–111)
Creatinine, Ser: 0.74 mg/dL (ref 0.44–1.00)
GFR, Estimated: >60 mL/min (ref >60)
Glucose, Bld: 148 mg/dL — ABNORMAL HIGH (ref 70–99)
Potassium: 4.7 mmol/L (ref 3.5–5.1)
Sodium: 134 mmol/L — ABNORMAL LOW (ref 135–145)

## 2021-03-19 LAB — VITAMIN B12: Vitamin B-12: 255 pg/mL (ref 180–914)

## 2021-03-19 LAB — PHOSPHORUS: Phosphorus: 4 mg/dL (ref 2.5–4.6)

## 2021-03-19 LAB — MAGNESIUM: Magnesium: 1.6 mg/dL — ABNORMAL LOW (ref 1.7–2.4)

## 2021-03-19 LAB — FOLATE: Folate: 4.6 ng/mL — ABNORMAL LOW (ref >5.9)

## 2021-03-19 LAB — FERRITIN: Ferritin: 132 ng/mL (ref 11–307)

## 2021-03-19 MED ORDER — MAGNESIUM SULFATE 2 GM/50ML IV SOLN
2.0000 g | Freq: Once | INTRAVENOUS | Status: AC
  Administered 2021-03-19: 2 g via INTRAVENOUS
  Filled 2021-03-19: qty 50

## 2021-03-19 MED ORDER — ENSURE ENLIVE PO LIQD
237.0000 mL | Freq: Two times a day (BID) | ORAL | Status: DC
  Administered 2021-03-19 – 2021-03-22 (×4): 237 mL via ORAL

## 2021-03-19 NOTE — Evaluation (Signed)
Occupational Therapy Evaluation Patient Details Name: [name] MRN: [id number] DOB: [birth year] Today's Date: 03/19/2021   History of Present Illness 87 yo female presenting to ED on 11/24 with fall and R hip pain. Sustained R acute intertrochanteric hip fx. S/p IM nail placement at RLE on 11/25. PMH including anemia, arthritis, CAD s/p stent, HTN, NSTEMI (2020), HTN, MI, back sx, R ORIF of femur fx (2021), and R TKA (2022).   Clinical Impression   PTA, pt was living with her son and was independent with BADLs and using a RW. Currently, pt requires Mod-Max A for LB ADLs and Min A for functional mobility using RW. Pt demonstrating decreased balance, strength, and activity tolerance. However, despite pain and fatigue, pt very motivated to participate in therapy. Pt would benefit from further acute OT to facilitate safe dc. Recommend dc to home with HHOT for further OT to optimize safety, independence with ADLs, and return to PLOF.     Recommendations for follow up therapy are one component of a multi-disciplinary discharge planning process, led by the attending physician.  Recommendations may be updated based on patient status, additional functional criteria and insurance authorization.   Follow Up Recommendations  Home health OT    Assistance Recommended at Discharge Frequent or constant Supervision/Assistance  Functional Status Assessment  Patient has had a recent decline in their functional status and demonstrates the ability to make significant improvements in function in a reasonable and predictable amount of time.  Equipment Recommendations  None recommended by OT    Recommendations for Other Services PT consult     Precautions / Restrictions Precautions Precautions: Fall Restrictions Weight Bearing Restrictions: Yes RLE Weight Bearing: Weight bearing as tolerated      Mobility Bed Mobility Overal bed mobility: Needs Assistance Bed Mobility: Supine to Sit      Supine to sit: Min assist;+2 for safety/equipment;HOB elevated     General bed mobility comments: Min A for elevating trunk    Transfers Overall transfer level: Needs assistance Equipment used: Rolling walker (2 wheels) Transfers: Sit to/from Stand Sit to Stand: Min assist;+2 safety/equipment           General transfer comment: Min A for power up and weight shift forward with slight posterior lean      Balance Overall balance assessment: Needs assistance Sitting-balance support: No upper extremity supported;Feet supported Sitting balance-Leahy Scale: Fair     Standing balance support: Bilateral upper extremity supported;During functional activity;Reliant on assistive device for balance Standing balance-Leahy Scale: Poor                             ADL either performed or assessed with clinical judgement   ADL Overall ADL's : Needs assistance/impaired Eating/Feeding: Set up;Sitting   Grooming: Set up;Sitting   Upper Body Bathing: Supervision/ safety;Set up;Sitting   Lower Body Bathing: Moderate assistance;Sit to/from stand   Upper Body Dressing : Supervision/safety;Set up;Sitting   Lower Body Dressing: Maximal assistance;Sit to/from stand   Toilet Transfer: Minimal assistance;Ambulation;Rolling walker (2 wheels) (simulated to recliner) Toilet Transfer Details (indicate cue type and reason): Min A for balance and slight posterior lean         Functional mobility during ADLs: Minimal assistance;+2 for safety/equipment;Rolling walker (2 wheels) General ADL Comments: Pt presenting with decreased balance, strength, and activity tolerance. Despite pain and fatigue, Pt very motivated to participate in therapy and get OOB     Vision Baseline Vision/History: 1 Wears glasses  Patient Visual Report: No change from baseline       Perception     Praxis      Pertinent Vitals/Pain Pain Assessment: Faces Faces Pain Scale: Hurts even more Pain Location:  RLE Pain Descriptors / Indicators: Discomfort;Constant Pain Intervention(s): Monitored during session;Limited activity within patient's tolerance;Premedicated before session;Repositioned     Hand Dominance Right   Extremity/Trunk Assessment Upper Extremity Assessment Upper Extremity Assessment: Generalized weakness   Lower Extremity Assessment Lower Extremity Assessment: Defer to PT evaluation   Cervical / Trunk Assessment Cervical / Trunk Assessment: Kyphotic   Communication Communication Communication: No difficulties   Cognition Arousal/Alertness: Awake/alert Behavior During Therapy: WFL for tasks assessed/performed Overall Cognitive Status: Within Functional Limits for tasks assessed                                       General Comments  Family present throughout. Spo2 97-93% on RA    Exercises     Shoulder Instructions      Home Living Family/patient expects to be discharged to:: Private residence Living Arrangements: Children Available Help at Discharge: Family;Available PRN/intermittently;Friend(s) Type of Home: House Home Access: Stairs to enter     Home Layout: One level     Bathroom Shower/Tub: Teacher, early years/pre: Standard     Home Equipment: Tub bench;BSC/3in1;Rollator (4 wheels);Rolling Walker (2 wheels);Wheelchair - manual          Prior Functioning/Environment Prior Level of Function : Independent/Modified Independent             Mobility Comments: Uses RW for mobility ADLs Comments: Performs BADLs. Family assist with IADLs        OT Problem List: Decreased strength;Decreased activity tolerance;Decreased range of motion;Decreased knowledge of use of DME or AE;Decreased knowledge of precautions;Pain      OT Treatment/Interventions: Self-care/ADL training;Therapeutic exercise;Energy conservation;DME and/or AE instruction;Therapeutic activities;Patient/family education    OT Goals(Current goals can be  found in the care plan section) Acute Rehab OT Goals Patient Stated Goal: Go home with son OT Goal Formulation: With patient Time For Goal Achievement: 04/01/21 Potential to Achieve Goals: Good ADL Goals Pt Will Perform Lower Body Dressing: sit to/from stand;with caregiver independent in assisting;with supervision Pt Will Transfer to Toilet: with supervision;ambulating;bedside commode Pt Will Perform Toileting - Clothing Manipulation and hygiene: with supervision;sit to/from stand;sitting/lateral leans Pt Will Perform Tub/Shower Transfer: Tub transfer;ambulating;tub bench;with supervision;rolling walker Additional ADL Goal #1: Pt will perform bed mobility with Supervision in preparation for ADLs  OT Frequency: Min 2X/week   Barriers to D/C:            Co-evaluation              AM-PAC OT "6 Clicks" Daily Activity     Outcome Measure Help from another person eating meals?: None Help from another person taking care of personal grooming?: A Little Help from another person toileting, which includes using toliet, bedpan, or urinal?: A Little Help from another person bathing (including washing, rinsing, drying)?: A Lot Help from another person to put on and taking off regular upper body clothing?: A Little Help from another person to put on and taking off regular lower body clothing?: A Lot 6 Click Score: 17   End of Session Equipment Utilized During Treatment: Gait belt;Rolling walker (2 wheels) Nurse Communication: Mobility status;Other (comment) (SpO2 levels; bleeding at IV site)  Activity Tolerance: Patient tolerated treatment  well Patient left: in chair;with call bell/phone within reach;with chair alarm set;with family/visitor present  OT Visit Diagnosis: Unsteadiness on feet (R26.81);Other abnormalities of gait and mobility (R26.89);Muscle weakness (generalized) (M62.81);Pain Pain - Right/Left: Right Pain - part of body: Hip                Time: 8546-2703 OT Time  Calculation (min): 24 min Charges:  OT General Charges $OT Visit: 1 Visit OT Evaluation $OT Eval Moderate Complexity: [place], OTR/L Acute Rehab Pager: [phone] Office: [place] 03/19/2021, 8:53 AM

## 2021-03-19 NOTE — Evaluation (Signed)
Physical Therapy Evaluation Patient Details Name: [name] MRN: [id number] DOB: January 23, 1930 Today's Date: 03/19/2021  History of Present Illness  87 yo female presenting to ED on 11/24 with fall and R hip pain. Sustained R acute intertrochanteric hip fx. S/p IM nail placement at RLE on 11/25. PMH including anemia, arthritis, CAD s/p stent, HTN, NSTEMI (2020), HTN, MI, back sx, R ORIF of femur fx (2021), and R TKA (2022).  Clinical Impression  Pt admitted as above and presenting with functional mobility limitations 2* decreased R LE strength/ROM and post op pain limiting functional mobility.  Pt very motivated and should progress to dc home with 24/7 family assist.     Recommendations for follow up therapy are one component of a multi-disciplinary discharge planning process, led by the attending physician.  Recommendations may be updated based on patient status, additional functional criteria and insurance authorization.  Follow Up Recommendations Home health PT    Assistance Recommended at Discharge Frequent or constant Supervision/Assistance  Functional Status Assessment Patient has had a recent decline in their functional status and demonstrates the ability to make significant improvements in function in a reasonable and predictable amount of time.  Equipment Recommendations  None recommended by PT    Recommendations for Other Services       Precautions / Restrictions Precautions Precautions: Fall Restrictions Weight Bearing Restrictions: No RLE Weight Bearing: Weight bearing as tolerated      Mobility  Bed Mobility Overal bed mobility: Needs Assistance Bed Mobility: Supine to Sit     Supine to sit: Min assist;+2 for safety/equipment;HOB elevated     General bed mobility comments: Min A for elevating trunk    Transfers Overall transfer level: Needs assistance Equipment used: Rolling walker (2 wheels) Transfers: Sit to/from Stand Sit to Stand: Min assist;+2  safety/equipment           General transfer comment: Min A for power up and weight shift forward with slight posterior lean    Ambulation/Gait Ambulation/Gait assistance: Min assist Gait Distance (Feet): 10 Feet Assistive device: Rolling walker (2 wheels) Gait Pattern/deviations: Step-to pattern;Decreased step length - right;Decreased step length - left;Shuffle;Trunk flexed Gait velocity: decr     General Gait Details: cues for sequence, posture and position from [employer]            Wheelchair Mobility    Modified Rankin (Stroke Patients Only)       Balance Overall balance assessment: Needs assistance Sitting-balance support: No upper extremity supported;Feet supported Sitting balance-Leahy Scale: Fair     Standing balance support: Bilateral upper extremity supported;During functional activity;Reliant on assistive device for balance Standing balance-Leahy Scale: Poor                               Pertinent Vitals/Pain Pain Assessment: Faces Faces Pain Scale: Hurts even more Pain Location: RLE Pain Descriptors / Indicators: Discomfort;Constant Pain Intervention(s): Limited activity within patient's tolerance;Monitored during session;RN gave pain meds during session    [place] expects to be discharged to:: Private residence Living Arrangements: Children Available Help at Discharge: Family;Available PRN/intermittently;Friend(s) Type of Home: House Home Access: Stairs to enter Entrance Stairs-Rails: None Entrance Stairs-Number of Steps: 1 (threshold from carport)   Home Layout: One level Home Equipment: Tub bench;BSC/3in1;Rollator (4 wheels);Rolling Walker (2 wheels);Wheelchair - manual      Prior Function Prior Level of Function : Independent/Modified Independent  Mobility Comments: Uses RW for mobility ADLs Comments: Performs BADLs. Family assist with IADLs     Hand Dominance   Dominant Hand:  Right    Extremity/Trunk Assessment   Upper Extremity Assessment Upper Extremity Assessment: Generalized weakness    Lower Extremity Assessment Lower Extremity Assessment: RLE deficits/detail;Generalized weakness    Cervical / Trunk Assessment Cervical / Trunk Assessment: Kyphotic  Communication   Communication: No difficulties  Cognition Arousal/Alertness: Awake/alert Behavior During Therapy: WFL for tasks assessed/performed Overall Cognitive Status: Within Functional Limits for tasks assessed                                          General Comments General comments (skin integrity, edema, etc.): Family present throughout. Spo2 97-93% on RA    Exercises     Assessment/Plan    PT Assessment Patient needs continued PT services  PT Problem List Decreased strength;Decreased range of motion;Decreased activity tolerance;Decreased balance;Decreased mobility;Decreased knowledge of use of DME;Pain       PT Treatment Interventions DME instruction;Gait training;Stair training;Functional mobility training;Therapeutic activities;Therapeutic exercise;Patient/family education    PT Goals (Current goals can be found in the Care Plan section)  Acute Rehab PT Goals Patient Stated Goal: Regain IND PT Goal Formulation: With patient Time For Goal Achievement: 04/01/21 Potential to Achieve Goals: Good    Frequency Min 6X/week   Barriers to discharge        Co-evaluation               AM-PAC PT "6 Clicks" Mobility  Outcome Measure Help needed turning from your back to your side while in a flat bed without using bedrails?: A Lot Help needed moving from lying on your back to sitting on the side of a flat bed without using bedrails?: A Lot Help needed moving to and from a bed to a chair (including a wheelchair)?: A Lot Help needed standing up from a chair using your arms (e.g., wheelchair or bedside chair)?: A Lot Help needed to walk in hospital room?:  Total Help needed climbing 3-5 steps with a railing? : Total 6 Click Score: 10    End of Session Equipment Utilized During Treatment: Gait belt Activity Tolerance: Patient tolerated treatment well;Patient limited by pain Patient left: in chair;with call bell/phone within reach;with chair alarm set;with family/visitor present Nurse Communication: Mobility status PT Visit Diagnosis: Difficulty in walking, not elsewhere classified (R26.2)    Time: 1694-5038 PT Time Calculation (min) (ACUTE ONLY): 23 min   Charges:   PT Evaluation $PT Eval Low Complexity: 1 Low          [place] Pager [phone] Office [phone]   Mylia Pondexter 03/19/2021, 11:35 AM

## 2021-03-19 NOTE — Progress Notes (Signed)
Physical Therapy Treatment Patient Details Name: [name] MRN: [id number] DOB: 21-Mar-1930 Today's Date: 03/19/2021   History of Present Illness 87 yo female presenting to ED on 11/24 with fall and R hip pain. Sustained R acute intertrochanteric hip fx. S/p IM nail placement at RLE on 11/25. PMH including anemia, arthritis, CAD s/p stent, HTN, NSTEMI (2020), HTN, MI, back sx, R ORIF of femur fx (2021), and R TKA (2022).    PT Comments    Pt very motivated and with steady improvement in mobility.  Pt and family hopeful to progress to dc home with family assist and follow up HHPT.   Recommendations for follow up therapy are one component of a multi-disciplinary discharge planning process, led by the attending physician.  Recommendations may be updated based on patient status, additional functional criteria and insurance authorization.  Follow Up Recommendations  Home health PT     Assistance Recommended at Discharge Frequent or constant Supervision/Assistance  Equipment Recommendations  None recommended by PT    Recommendations for Other Services       Precautions / Restrictions Precautions Precautions: Fall Restrictions Weight Bearing Restrictions: No RLE Weight Bearing: Weight bearing as tolerated     Mobility  Bed Mobility Overal bed mobility: Needs Assistance Bed Mobility: Sit to Supine       Sit to supine: Mod assist   General bed mobility comments: Increased time with cues for sequence and assist to manage LEs onto bed    Transfers Overall transfer level: Needs assistance Equipment used: Rolling walker (2 wheels) Transfers: Sit to/from Stand Sit to Stand: Min assist;Mod assist           General transfer comment: cues for LE management and use of UEs to self assist.  Physical assist to bring wt up and fwd and to balance in initial standing    Ambulation/Gait Ambulation/Gait assistance: Min assist Gait Distance (Feet): 25 Feet Assistive device:  Rolling walker (2 wheels) Gait Pattern/deviations: Step-to pattern;Decreased step length - right;Decreased step length - left;Shuffle;Trunk flexed Gait velocity: decr     General Gait Details: cues for sequence, posture and position from [employer]             Wheelchair Mobility    Modified Rankin (Stroke Patients Only)       Balance Overall balance assessment: Needs assistance Sitting-balance support: No upper extremity supported;Feet supported Sitting balance-Leahy Scale: Fair     Standing balance support: Bilateral upper extremity supported;During functional activity;Reliant on assistive device for balance Standing balance-Leahy Scale: Poor                              Cognition Arousal/Alertness: Awake/alert Behavior During Therapy: WFL for tasks assessed/performed Overall Cognitive Status: Within Functional Limits for tasks assessed                                          Exercises General Exercises - Lower Extremity Ankle Circles/Pumps: AROM;Both;15 reps;Supine Quad Sets: AROM;Both;10 reps;Supine Heel Slides: AAROM;Right;20 reps;Supine Hip ABduction/ADduction: AAROM;Right;15 reps;Supine    General Comments        Pertinent Vitals/Pain Pain Assessment: 0-10 Pain Score: 5  Pain Location: RLE Pain Descriptors / Indicators: Aching;Sore Pain Intervention(s): Limited activity within patient's tolerance;Monitored during session;Premedicated before session    Home Living  Prior Function            PT Goals (current goals can now be found in the care plan section) Acute Rehab PT Goals Patient Stated Goal: Regain IND PT Goal Formulation: With patient Time For Goal Achievement: 04/01/21 Potential to Achieve Goals: Good Progress towards PT goals: Progressing toward goals    Frequency    Min 6X/week      PT Plan Current plan remains appropriate    Co-evaluation               AM-PAC PT "6 Clicks" Mobility   Outcome Measure  Help needed turning from your back to your side while in a flat bed without using bedrails?: A Lot Help needed moving from lying on your back to sitting on the side of a flat bed without using bedrails?: A Lot Help needed moving to and from a bed to a chair (including a wheelchair)?: A Lot Help needed standing up from a chair using your arms (e.g., wheelchair or bedside chair)?: A Lot Help needed to walk in hospital room?: A Lot Help needed climbing 3-5 steps with a railing? : Total 6 Click Score: 11    End of Session Equipment Utilized During Treatment: Gait belt Activity Tolerance: Patient tolerated treatment well;Patient limited by pain Patient left: in bed;with call bell/phone within reach;with bed alarm set;with family/visitor present Nurse Communication: Mobility status PT Visit Diagnosis: Difficulty in walking, not elsewhere classified (R26.2)     Time: 9379-0240 PT Time Calculation (min) (ACUTE ONLY): 24 min  Charges:  $Gait Training: 8-22 mins $Therapeutic Exercise: 8-22 mins                     Debe Coder PT Acute Rehabilitation Services Pager [phone] Office [phone]    [name] 03/19/2021, 4:10 PM

## 2021-03-19 NOTE — Progress Notes (Addendum)
PROGRESS NOTE    [name]  WUJ:[id number] DOB: April 15, 1930 DOA: 03/17/2021 PCP: Bernerd Limbo, MD    Brief Narrative:  [name] is an 87 year old female with past medical history significant for essential hypertension, hyperlipidemia, seizure disorder who presented to [facility] long hospital after mechanical fall with subsequent right hip pain and inability to ambulate due to acute right intratrochanteric hip fracture.  No loss of consciousness.  Orthopedics was consulted and patient was transferred to [facility] for surgical intervention.  Hospital service consulted for further evaluation management.   Assessment & Plan:   Principal Problem:   Closed right hip fracture [facility]) Active Problems:   Essential hypertension   Seizure disorder (HCC)   Dehydration   Acute prerenal azotemia   Fall at home, initial encounter   HLD (hyperlipidemia)   Right hip intratrochanteric fracture Patient presenting to the ED following mechanical fall without loss of consciousness.  Inability to ambulate with acute pain.  Imaging notable for acute right hip intratrochanteric fracture.  Orthopedics was consulted and patient underwent intramedullary narrowly nailing by Dr. Stann Mainland on 03/18/2021.  Vitamin D 25 hydroxy 58.88. --Postoperative DVT prophylaxis with aspirin 325mg  PO daily per orthopedics --WBAT --Norco 5-325mg  PO q4h PRN moderate pain --Morphine 3 mg IV q2h PRN severe breakthrough pain --Robaxin 500 mg p.o. q6h PRN muscle spasm --PT/OT currently recommending home health; TOC for evaluation  Postoperative anemia --Hgb 10.1>8.8>7.5 --Check anemia panel --Type and screen in the a.m. --Repeat CBC in a.m. --Transfuse for hemoglobin less than 7.0  Essential hypertension On amlodipine 10 mg p.o. daily at home. --BP 111/57 this morning, off of antihypertensives at this time --Continue hold home amlodipine given well-controlled BP --Hydralazine PRN --Continue monitor BP  closely  Hx seizure disorder: Keppra 500 mg p.o. nightly  Hyperlipidemia --Zetia 10 mg p.o. daily   DVT prophylaxis: SCDs Start: 03/18/21 1235 SCDs Start: 03/17/21 2332   Code Status: DNR Family Communication: Updated family present at bedside this morning  Disposition Plan:  Level of care: Med-Surg Status is: Inpatient  Remains inpatient appropriate because: Postoperative PT/OT evaluation      Consultants:  [facility]; Dr. Stann Mainland  Procedures:  Intramedullary nail, right femur; Dr. Stann Mainland 03/18/2021  Antimicrobials:  Perioperative cefazolin   Subjective: Patient seen examined at bedside, resting comfortably.  Work with physical and occupational therapy this morning, with current recommendations of home health.  Hemoglobin has dropped from 10.1-7.5 this morning.  No specific complaints.  Family present at bedside and updated.  Denies headache, no chest pain, no shortness of breath, no abdominal pain, no fever/chills/night sweats, no nausea/vomiting/diarrhea.  No acute events overnight per nurse staff.  Objective: Vitals:   03/19/21 0107 03/19/21 0455 03/19/21 0801 03/19/21 1326  BP: (!) 105/55 (!) 111/57 (!) 128/49 (!) 112/49  Pulse: 77 62 (!) 58 68  Resp: 16 16 16 16   Temp: 97.9 F (36.6 C) 98.4 F (36.9 C) 98.2 F (36.8 C) (!) 97.5 F (36.4 C)  TempSrc:  Oral Oral Oral  SpO2: 100% 100% 100% 92%  Weight:      Height:        Intake/Output Summary (Last 24 hours) at 03/19/2021 1400 Last data filed at 03/19/2021 1332 Gross per 24 hour  Intake 2129.31 ml  Output 500 ml  Net 1629.31 ml   Filed Weights   03/17/21 2101  Weight: 63.5 kg    Examination:  General exam: Appears calm and comfortable, elderly in appearance Respiratory system: Clear to auscultation. Respiratory effort normal.  On 2 L nasal cannula with SPO2 100% at rest. Cardiovascular system: S1 & S2 heard, RRR. No JVD, murmurs, rubs, gallops or clicks. No pedal edema. Gastrointestinal system:  Abdomen is nondistended, soft and nontender. No organomegaly or masses felt. Normal bowel sounds heard. Central nervous system: Alert and oriented. No focal neurological deficits. Extremities: Symmetric 5 x 5 power.  Neurovascular intact Skin: Surgical dressing noted, clean/dry/intact no rashes, lesions or ulcers Psychiatry: Judgement and insight appear normal. Mood & affect appropriate.     Data Reviewed: I have personally reviewed following labs and imaging studies  CBC: Recent Labs  Lab 03/17/21 2109 03/18/21 0342 03/19/21 0315  WBC 6.6 7.0 6.4  NEUTROABS 4.8  --   --   HGB 10.1* 8.8* 7.5*  HCT 31.7* 28.1* 24.0*  MCV 95.2 97.2 96.8  PLT 129* 115* 831*   Basic Metabolic Panel: Recent Labs  Lab 03/17/21 2109 03/18/21 0342 03/19/21 0315  NA 139 137 134*  K 3.8 4.2 4.7  CL 108 107 101  CO2 23 25 26   GLUCOSE 138* 121* 148*  BUN 20 17 20   CREATININE 0.77 0.73 0.74  CALCIUM 9.1 8.5* 8.3*  MG  --  1.7 1.6*  PHOS  --   --  4.0   GFR: Estimated Creatinine Clearance: 42.6 mL/min (by C-G formula based on SCr of 0.74 mg/dL). Liver Function Tests: Recent Labs  Lab 03/18/21 0342  AST 25  ALT 15  ALKPHOS 85  BILITOT 0.5  PROT 5.5*  ALBUMIN 3.1*   No results for input(s): LIPASE, AMYLASE in the last 168 hours. No results for input(s): AMMONIA in the last 168 hours. Coagulation Profile: Recent Labs  Lab 03/18/21 0342  INR 1.2   Cardiac Enzymes: No results for input(s): CKTOTAL, CKMB, CKMBINDEX, TROPONINI in the last 168 hours. BNP (last 3 results) No results for input(s): PROBNP in the last 8760 hours. HbA1C: No results for input(s): HGBA1C in the last 72 hours. CBG: No results for input(s): GLUCAP in the last 168 hours. Lipid Profile: No results for input(s): CHOL, HDL, LDLCALC, TRIG, CHOLHDL, LDLDIRECT in the last 72 hours. Thyroid Function Tests: No results for input(s): TSH, T4TOTAL, FREET4, T3FREE, THYROIDAB in the last 72 hours. Anemia Panel: No  results for input(s): VITAMINB12, FOLATE, FERRITIN, TIBC, IRON, RETICCTPCT in the last 72 hours. Sepsis Labs: No results for input(s): PROCALCITON, LATICACIDVEN in the last 168 hours.  Recent Results (from the past 240 hour(s))  Resp Panel by RT-PCR (Flu A&B, Covid) Nasopharyngeal Swab     Status: None   Collection Time: 03/17/21  9:09 PM   Specimen: Nasopharyngeal Swab; Nasopharyngeal(NP) swabs in vial transport medium  Result Value Ref Range Status   SARS Coronavirus 2 by RT PCR NEGATIVE NEGATIVE Final    Comment: (NOTE) SARS-CoV-2 target nucleic acids are NOT DETECTED.  The SARS-CoV-2 RNA is generally detectable in upper respiratory specimens during the acute phase of infection. The lowest concentration of SARS-CoV-2 viral copies this assay can detect is 138 copies/mL. A negative result does not preclude SARS-Cov-2 infection and should not be used as the sole basis for treatment or other patient management decisions. A negative result may occur with  improper specimen collection/handling, submission of specimen other than nasopharyngeal swab, presence of viral mutation(s) within the areas targeted by this assay, and inadequate number of viral copies(<138 copies/mL). A negative result must be combined with clinical observations, patient history, and epidemiological information. The expected result is Negative.  Fact Sheet for Patients:  EntrepreneurPulse.com.au  Fact Sheet for Healthcare Providers:  IncredibleEmployment.be  This test is no t yet approved or cleared by the [place] FDA and  has been authorized for detection and/or diagnosis of SARS-CoV-2 by FDA under an Emergency Use Authorization (EUA). This EUA will remain  in effect (meaning this test can be used) for the duration of the COVID-19 declaration under Section 564(b)(1) of the Act, 21 U.S.C.section 360bbb-3(b)(1), unless the authorization is terminated  or revoked sooner.        Influenza A by PCR NEGATIVE NEGATIVE Final   Influenza B by PCR NEGATIVE NEGATIVE Final    Comment: (NOTE) The Xpert Xpress SARS-CoV-2/FLU/RSV plus assay is intended as an aid in the diagnosis of influenza from Nasopharyngeal swab specimens and should not be used as a sole basis for treatment. Nasal washings and aspirates are unacceptable for Xpert Xpress SARS-CoV-2/FLU/RSV testing.  Fact Sheet for Patients: EntrepreneurPulse.com.au  Fact Sheet for Healthcare Providers: IncredibleEmployment.be  This test is not yet approved or cleared by the [place] FDA and has been authorized for detection and/or diagnosis of SARS-CoV-2 by FDA under an Emergency Use Authorization (EUA). This EUA will remain in effect (meaning this test can be used) for the duration of the COVID-19 declaration under Section 564(b)(1) of the Act, 21 U.S.C. section 360bbb-3(b)(1), unless the authorization is terminated or revoked.  Performed at [facility], [place] [address] [place], [place] [address]          Radiology Studies: DG Chest 1 View  Result Date: 03/17/2021 CLINICAL DATA:  Status post fall EXAM: CHEST  1 VIEW COMPARISON:  Chest x-ray 12/14/2019 FINDINGS: The heart and mediastinal contours are unchanged. Aortic calcification. No focal consolidation. Persistent coarsened interstitial markings with no overt pulmonary edema. No pleural effusion. No pneumothorax. No acute osseous abnormality. IMPRESSION: No active disease. Electronically Signed   By: Iven Finn M.D.   On: 03/17/2021 22:22   DG Pelvis 1-2 Views  Result Date: 03/17/2021 CLINICAL DATA:  Status post fall EXAM: PELVIS - 1-2 VIEW; RIGHT FEMUR 2 VIEWS COMPARISON:  X-ray right femur 12/15/2019, x-ray right hip 12/14/2019 FINDINGS: Pelvis: Markedly limited evaluation due to overlapping osseous structures and overlying soft tissues. There is no evidence of pelvic fracture or diastasis.  No pelvic bone lesions are seen. Frontal view of the left hip grossly unremarkable. Right femur: Acute intertrochanteric and proximal femoral shaft fracture. Partially visualized total right knee arthroplasty. Femoral shaft markings suggestive of removal of prior surgical hardware. Vascular calcifications. IMPRESSION: 1. Acute intertrochanteric and proximal femoral shaft fracture. 2. No acute displaced fracture or diastasis of the bones of the pelvis. Markedly limited evaluation due to overlapping osseous structures and overlying soft tissues. 3. Frontal view of the left hip negative for acute traumatic injury. 4. Total right knee arthroplasty. Electronically Signed   By: Iven Finn M.D.   On: 03/17/2021 22:25   DG C-Arm 1-60 Min-No Report  Result Date: 03/18/2021 Fluoroscopy was utilized by the requesting physician.  No radiographic interpretation.   DG FEMUR, MIN 2 VIEWS RIGHT  Result Date: 03/18/2021 CLINICAL DATA:  Portable operative imaging for right proximal femur fracture ORIF. EXAM: OPERATIVE RIGHT HIP (WITH PELVIS IF PERFORMED) 6 VIEWS TECHNIQUE: Fluoroscopic spot image(s) were submitted for interpretation post-operatively. COMPARISON:  03/17/2021 FINDINGS: Portable images show placement of intramedullary rod supporting a compression screw, reducing the intertrochanteric fracture components into near anatomic alignment. The orthopedic hardware is well seated. IMPRESSION: Well aligned proximal right femur fracture following ORIF. Electronically Signed  By: Lajean Manes M.D.   On: 03/18/2021 11:24   DG FEMUR, MIN 2 VIEWS RIGHT  Result Date: 03/17/2021 CLINICAL DATA:  Status post fall EXAM: PELVIS - 1-2 VIEW; RIGHT FEMUR 2 VIEWS COMPARISON:  X-ray right femur 12/15/2019, x-ray right hip 12/14/2019 FINDINGS: Pelvis: Markedly limited evaluation due to overlapping osseous structures and overlying soft tissues. There is no evidence of pelvic fracture or diastasis. No pelvic bone lesions are  seen. Frontal view of the left hip grossly unremarkable. Right femur: Acute intertrochanteric and proximal femoral shaft fracture. Partially visualized total right knee arthroplasty. Femoral shaft markings suggestive of removal of prior surgical hardware. Vascular calcifications. IMPRESSION: 1. Acute intertrochanteric and proximal femoral shaft fracture. 2. No acute displaced fracture or diastasis of the bones of the pelvis. Markedly limited evaluation due to overlapping osseous structures and overlying soft tissues. 3. Frontal view of the left hip negative for acute traumatic injury. 4. Total right knee arthroplasty. Electronically Signed   By: Iven Finn M.D.   On: 03/17/2021 22:25        Scheduled Meds:  aspirin EC  325 mg Oral Q breakfast   docusate sodium  100 mg Oral BID   ezetimibe  10 mg Oral Daily   feeding supplement  237 mL Oral BID BM   levETIRAcetam  500 mg Oral QHS   oxybutynin  10 mg Oral QHS   Continuous Infusions:  sodium chloride Stopped (03/19/21 1220)     LOS: 2 days    Time spent: 38 minutes spent on chart review, discussion with nursing staff, consultants, updating family and interview/physical exam; more than 50% of that time was spent in counseling and/or coordination of care.    Zoiey Christy J [place] DO [facility] Available via Epic secure chat 7am-7pm After these hours, please refer to coverage provider listed on [email or web address] 03/19/2021, 2:00 PM

## 2021-03-19 NOTE — Plan of Care (Signed)
Plan of care reviewed and discussed with the patient and her family.

## 2021-03-19 NOTE — Plan of Care (Signed)
  Problem: Clinical Measurements: Goal: Respiratory complications will improve Outcome: Progressing   Problem: Clinical Measurements: Goal: Diagnostic test results will improve Outcome: Progressing   Problem: Clinical Measurements: Goal: Cardiovascular complication will be avoided Outcome: Progressing   Problem: Coping: Goal: Level of anxiety will decrease Outcome: Progressing   Problem: Elimination: Goal: Will not experience complications related to bowel motility Outcome: Progressing

## 2021-03-19 NOTE — Progress Notes (Signed)
Subjective: 1 Day Post-Op Procedure(s) (LRB): INTRAMEDULLARY (IM) NAIL FEMORAL (Right)  Patient reports pain as mild.    Objective:   VITALS:  Temp:  [97.6 F (36.4 C)-98.4 F (36.9 C)] 98.2 F (36.8 C) (11/26 0801) Pulse Rate:  [58-84] 58 (11/26 0801) Resp:  [12-16] 16 (11/26 0801) BP: (105-143)/(49-64) 128/49 (11/26 0801) SpO2:  [99 %-100 %] 100 % (11/26 0801)  Neurologically intact ABD soft Neurovascular intact Sensation intact distally Intact pulses distally Dorsiflexion/Plantar flexion intact No cellulitis present Compartment soft Dressings CDI   LABS Recent Labs    03/17/21 2109 03/18/21 0342 03/19/21 0315  HGB 10.1* 8.8* 7.5*  WBC 6.6 7.0 6.4  PLT 129* 115* 104*   Recent Labs    03/18/21 0342 03/19/21 0315  NA 137 134*  K 4.2 4.7  CL 107 101  CO2 25 26  BUN 17 20  CREATININE 0.73 0.74  GLUCOSE 121* 148*   Recent Labs    03/18/21 0342  INR 1.2     Assessment/Plan: 1 Day Post-Op Procedure(s) (LRB): INTRAMEDULLARY (IM) NAIL FEMORAL (Right)  The patient will be weight bearing as tolerated with assistive device and will return in 2 weeks from day of surgery for staple removal and the patient will receive DVT prophylaxis based on other medications, activity level, and risk ratio of bleeding to thrombosis.  She will resume her preoperative aspirin here in the hospital which will serve as her DVT prophylaxis as well as sequential compression devices.    Armond Hang 03/19/2021, 8:59 AM

## 2021-03-20 DIAGNOSIS — N19 Unspecified kidney failure: Secondary | ICD-10-CM | POA: Diagnosis not present

## 2021-03-20 DIAGNOSIS — I10 Essential (primary) hypertension: Secondary | ICD-10-CM | POA: Diagnosis not present

## 2021-03-20 DIAGNOSIS — D509 Iron deficiency anemia, unspecified: Secondary | ICD-10-CM

## 2021-03-20 DIAGNOSIS — S72001A Fracture of unspecified part of neck of right femur, initial encounter for closed fracture: Secondary | ICD-10-CM | POA: Diagnosis not present

## 2021-03-20 DIAGNOSIS — D62 Acute posthemorrhagic anemia: Secondary | ICD-10-CM

## 2021-03-20 DIAGNOSIS — E860 Dehydration: Secondary | ICD-10-CM | POA: Diagnosis not present

## 2021-03-20 LAB — CBC
HCT: 21.8 % — ABNORMAL LOW (ref 36.0–46.0)
Hemoglobin: 7.1 g/dL — ABNORMAL LOW (ref 12.0–15.0)
MCH: 31.4 pg (ref 26.0–34.0)
MCHC: 32.6 g/dL (ref 30.0–36.0)
MCV: 96.5 fL (ref 80.0–100.0)
Platelets: 105 10*3/uL — ABNORMAL LOW (ref 150–400)
RBC: 2.26 MIL/uL — ABNORMAL LOW (ref 3.87–5.11)
RDW: 14.2 % (ref 11.5–15.5)
WBC: 5.6 10*3/uL (ref 4.0–10.5)
nRBC: 0 % (ref 0.0–0.2)

## 2021-03-20 LAB — HEMOGLOBIN AND HEMATOCRIT, BLOOD
HCT: 30.1 % — ABNORMAL LOW (ref 36.0–46.0)
Hemoglobin: 9.6 g/dL — ABNORMAL LOW (ref 12.0–15.0)

## 2021-03-20 LAB — PREPARE RBC (CROSSMATCH)

## 2021-03-20 MED ORDER — POLYETHYLENE GLYCOL 3350 17 G PO PACK
17.0000 g | PACK | Freq: Every day | ORAL | Status: DC | PRN
  Administered 2021-03-20: 16:00:00 17 g via ORAL
  Filled 2021-03-20: qty 1

## 2021-03-20 MED ORDER — SODIUM CHLORIDE 0.9% IV SOLUTION: Freq: Once | INTRAVENOUS | Status: AC

## 2021-03-20 MED ORDER — SODIUM CHLORIDE 0.9 % IV SOLN
250.0000 mg | Freq: Every day | INTRAVENOUS | Status: AC
  Administered 2021-03-20 – 2021-03-21 (×2): 250 mg via INTRAVENOUS
  Filled 2021-03-20 (×2): qty 20

## 2021-03-20 NOTE — Progress Notes (Signed)
Physical Therapy Treatment Patient Details Name: [name] MRN: [id number] DOB: [birth year] Today's Date: 03/20/2021   History of Present Illness 87 yo female presenting to ED on 11/24 with fall and R hip pain. Sustained R acute intertrochanteric hip fx. S/p IM nail placement at [facility] on 11/25. PMH including anemia, arthritis, CAD s/p stent, HTN, NSTEMI (2020), HTN, MI, back sx, R ORIF of femur fx (2021), and R TKA (2022).    PT Comments    Pt  continues very motivated but ltd this date by increased fatigue.  Pt had just completed 1 unit RBC and currently receiving iron.     Recommendations for follow up therapy are one component of a multi-disciplinary discharge planning process, led by the attending physician.  Recommendations may be updated based on patient status, additional functional criteria and insurance authorization.  Follow Up Recommendations  Home health PT     Assistance Recommended at Discharge Frequent or constant Supervision/Assistance  Equipment Recommendations  None recommended by PT    Recommendations for Other Services       Precautions / Restrictions Precautions Precautions: Fall Restrictions Weight Bearing Restrictions: No RLE Weight Bearing: Weight bearing as tolerated     Mobility  Bed Mobility Overal bed mobility: Needs Assistance Bed Mobility: Supine to Sit     Supine to sit: +2 for physical assistance;Min assist;Mod assist     General bed mobility comments: Increased time with cues for sequence, use of L LE to self assist and physical assist to manage R LE and control trunk    Transfers Overall transfer level: Needs assistance Equipment used: Rolling walker (2 wheels) Transfers: Sit to/from Stand Sit to Stand: Min assist;Mod assist;+2 safety/equipment           General transfer comment: cues for LE management and use of UEs to self assist.  Physical assist to bring wt up and fwd and to balance in initial standing     Ambulation/Gait Ambulation/Gait assistance: Min assist;Mod assist;+2 safety/equipment (chair follow) Gait Distance (Feet): 11 Feet Assistive device: Rolling walker (2 wheels) Gait Pattern/deviations: Step-to pattern;Decreased step length - right;Decreased step length - left;Shuffle;Trunk flexed Gait velocity: decr     General Gait Details: cues for sequence, posture and position from [employer]             Wheelchair Mobility    Modified Rankin (Stroke Patients Only)       Balance Overall balance assessment: Needs assistance Sitting-balance support: No upper extremity supported;Feet supported Sitting balance-Leahy Scale: Fair     Standing balance support: Bilateral upper extremity supported;During functional activity;Reliant on assistive device for balance Standing balance-Leahy Scale: Poor                              Cognition Arousal/Alertness: Awake/alert Behavior During Therapy: WFL for tasks assessed/performed Overall Cognitive Status: Within Functional Limits for tasks assessed                                          Exercises      General Comments        Pertinent Vitals/Pain Pain Assessment: 0-10 Pain Score: 5  Pain Location: RLE Pain Descriptors / Indicators: Aching;Sore Pain Intervention(s): Limited activity within patient's tolerance;Monitored during session;Premedicated before session;Ice applied    Home Living  Prior Function            PT Goals (current goals can now be found in the care plan section) Acute Rehab PT Goals Patient Stated Goal: Regain IND PT Goal Formulation: With patient Time For Goal Achievement: 04/01/21 Potential to Achieve Goals: Good Progress towards PT goals: Not progressing toward goals - comment (increased fatigue this date - had just completed 1 unit RBC)    Frequency    Min 6X/week      PT Plan Current plan remains appropriate     Co-evaluation              AM-PAC PT "6 Clicks" Mobility   Outcome Measure  Help needed turning from your back to your side while in a flat bed without using bedrails?: A Lot Help needed moving from lying on your back to sitting on the side of a flat bed without using bedrails?: A Lot Help needed moving to and from a bed to a chair (including a wheelchair)?: A Lot Help needed standing up from a chair using your arms (e.g., wheelchair or bedside chair)?: A Lot Help needed to walk in hospital room?: Total Help needed climbing 3-5 steps with a railing? : Total 6 Click Score: 10    End of Session Equipment Utilized During Treatment: Gait belt Activity Tolerance: Patient tolerated treatment well;Patient limited by fatigue Patient left: in chair;with call bell/phone within reach;with chair alarm set Nurse Communication: Mobility status PT Visit Diagnosis: Difficulty in walking, not elsewhere classified (R26.2)     Time: 2633-3545 PT Time Calculation (min) (ACUTE ONLY): 25 min  Charges:  $Gait Training: 8-22 mins $Therapeutic Activity: 8-22 mins                     [place] Pager [phone] Office [phone]    Min Tunnell 03/20/2021, 3:13 PM

## 2021-03-20 NOTE — Plan of Care (Signed)
  Problem: Clinical Measurements: Goal: Respiratory complications will improve Outcome: Progressing   Problem: Clinical Measurements: Goal: Cardiovascular complication will be avoided Outcome: Progressing   Problem: Elimination: Goal: Will not experience complications related to bowel motility Outcome: Progressing   Problem: Elimination: Goal: Will not experience complications related to urinary retention Outcome: Progressing   Problem: Activity: Goal: Ability to ambulate and perform ADLs will improve Outcome: Progressing

## 2021-03-20 NOTE — Progress Notes (Signed)
PROGRESS NOTE    [name]  BWG:[id number] DOB: 11/20/1929 DOA: 03/17/2021 PCP: Bernerd Limbo, MD    Brief Narrative:  [name] is an 87 year old female with past medical history significant for essential hypertension, hyperlipidemia, seizure disorder who presented to [facility] long hospital after mechanical fall with subsequent right hip pain and inability to ambulate due to acute right intratrochanteric hip fracture.  No loss of consciousness.  Orthopedics was consulted and patient was transferred to [facility] for surgical intervention.  Hospital service consulted for further evaluation management.   Assessment & Plan:   Principal Problem:   Closed right hip fracture [facility]) Active Problems:   Essential hypertension   Seizure disorder (HCC)   Dehydration   Acute prerenal azotemia   Fall at home, initial encounter   HLD (hyperlipidemia)   Right hip intratrochanteric fracture Patient presenting to the ED following mechanical fall without loss of consciousness.  Inability to ambulate with acute pain.  Imaging notable for acute right hip intratrochanteric fracture.  Orthopedics was consulted and patient underwent intramedullary narrowly nailing by Dr. Stann Mainland on 03/18/2021.  Vitamin D 25 hydroxy 58.88. --Postoperative DVT prophylaxis with aspirin 325mg  PO daily per orthopedics --WBAT --Norco 5-325mg  PO q4h PRN moderate pain --Morphine 3 mg IV q2h PRN severe breakthrough pain --Robaxin 500 mg p.o. q6h PRN muscle spasm --PT/OT currently recommending home health; home health orders placed.  Postoperative anemia Anemia panel with iron 19, TIBC low at 230, ferritin 132, folate 4.6, B12 255. --Hgb 10.1>8.8>7.5>7.1 --Transfuse 1 unit PRBC today; repeat hemoglobin posttransfusion --IV iron daily x 2 --Repeat CBC in a.m. --Transfuse for hemoglobin less than 7.0  Acute respiratory failure Likely multifactorial with anemia coupled with likely postoperative  atelectasis. --Transfusing as above --Incentive spirometry --Wean oxygen, maintain SPO2 greater than 92%; currently on 2 L nasal cannula with SPO2 98% at rest  Essential hypertension On amlodipine 10 mg p.o. daily at home. --BP 121/52 this morning, off of antihypertensives at this time --Continue hold home amlodipine given well-controlled BP --Hydralazine PRN --Continue monitor BP closely  Hx seizure disorder: Keppra 500 mg p.o. nightly  Hyperlipidemia --Zetia 10 mg p.o. daily   DVT prophylaxis: SCDs Start: 03/18/21 1235 SCDs Start: 03/17/21 2332   Code Status: DNR Family Communication: Updated family present at bedside this morning  Disposition Plan:  Level of care: Med-Surg Status is: Inpatient  Remains inpatient appropriate because: Transfusing for postoperative anemia, also receiving IV iron, continue PT/OT with anticipated discharge home in 1-2 days      Consultants:  [facility]; Dr. Stann Mainland  Procedures:  Intramedullary nail, right femur; Dr. Stann Mainland 03/18/2021  Antimicrobials:  Perioperative cefazolin   Subjective: Patient seen examined at bedside, resting comfortably.  No family present at bedside this morning.  Labs with worsening postoperative anemia with iron deficiency on anemia panel.  We will transfusing 1 unit PRBC and IV iron today.  No other complaints or concerns at this time.  Denies headache, no chest pain, no shortness of breath, no abdominal pain, no fever/chills/night sweats, no nausea/vomiting/diarrhea.  No acute events overnight per nurse staff.  Objective: Vitals:   03/20/21 0936 03/20/21 0951 03/20/21 0952 03/20/21 1007  BP: (!) 103/45 (!) 103/45  (!) 121/52  Pulse: 79 79  86  Resp: 15 15  18   Temp: 98.1 F (36.7 C) 98.1 F (36.7 C)  98.1 F (36.7 C)  TempSrc:  Oral Oral Oral  SpO2: 100% 100%  100%  Weight:      Height:  Intake/Output Summary (Last 24 hours) at 03/20/2021 1021 Last data filed at 03/20/2021 6720 Gross per 24  hour  Intake 2213.2 ml  Output 0 ml  Net 2213.2 ml   Filed Weights   03/17/21 2101 03/20/21 0500  Weight: 63.5 kg 51.1 kg    Examination:  General exam: Appears calm and comfortable, elderly in appearance Respiratory system: Clear to auscultation. Respiratory effort normal.  On 2 L nasal cannula with SPO2 98% at rest. Cardiovascular system: S1 & S2 heard, RRR. No JVD, murmurs, rubs, gallops or clicks. No pedal edema. Gastrointestinal system: Abdomen is nondistended, soft and nontender. No organomegaly or masses felt. Normal bowel sounds heard. Central nervous system: Alert and oriented. No focal neurological deficits. Extremities: Symmetric 5 x 5 power.  Neurovascular intact Skin: Surgical dressing noted, clean/dry/intact no rashes, lesions or ulcers Psychiatry: Judgement and insight appear normal. Mood & affect appropriate.     Data Reviewed: I have personally reviewed following labs and imaging studies  CBC: Recent Labs  Lab 03/17/21 2109 03/18/21 0342 03/19/21 0315 03/20/21 0339  WBC 6.6 7.0 6.4 5.6  NEUTROABS 4.8  --   --   --   HGB 10.1* 8.8* 7.5* 7.1*  HCT 31.7* 28.1* 24.0* 21.8*  MCV 95.2 97.2 96.8 96.5  PLT 129* 115* 104* 947*   Basic Metabolic Panel: Recent Labs  Lab 03/17/21 2109 03/18/21 0342 03/19/21 0315  NA 139 137 134*  K 3.8 4.2 4.7  CL 108 107 101  CO2 23 25 26   GLUCOSE 138* 121* 148*  BUN 20 17 20   CREATININE 0.77 0.73 0.74  CALCIUM 9.1 8.5* 8.3*  MG  --  1.7 1.6*  PHOS  --   --  4.0   GFR: Estimated Creatinine Clearance: 38.4 mL/min (by C-G formula based on SCr of 0.74 mg/dL). Liver Function Tests: Recent Labs  Lab 03/18/21 0342  AST 25  ALT 15  ALKPHOS 85  BILITOT 0.5  PROT 5.5*  ALBUMIN 3.1*   No results for input(s): LIPASE, AMYLASE in the last 168 hours. No results for input(s): AMMONIA in the last 168 hours. Coagulation Profile: Recent Labs  Lab 03/18/21 0342  INR 1.2   Cardiac Enzymes: No results for input(s):  CKTOTAL, CKMB, CKMBINDEX, TROPONINI in the last 168 hours. BNP (last 3 results) No results for input(s): PROBNP in the last 8760 hours. HbA1C: No results for input(s): HGBA1C in the last 72 hours. CBG: No results for input(s): GLUCAP in the last 168 hours. Lipid Profile: No results for input(s): CHOL, HDL, LDLCALC, TRIG, CHOLHDL, LDLDIRECT in the last 72 hours. Thyroid Function Tests: No results for input(s): TSH, T4TOTAL, FREET4, T3FREE, THYROIDAB in the last 72 hours. Anemia Panel: Recent Labs    03/19/21 1501  VITAMINB12 255  FOLATE 4.6*  FERRITIN 132  TIBC 230*  IRON 19*  RETICCTPCT 1.7   Sepsis Labs: No results for input(s): PROCALCITON, LATICACIDVEN in the last 168 hours.  Recent Results (from the past 240 hour(s))  Resp Panel by RT-PCR (Flu A&B, Covid) Nasopharyngeal Swab     Status: None   Collection Time: 03/17/21  9:09 PM   Specimen: Nasopharyngeal Swab; Nasopharyngeal(NP) swabs in vial transport medium  Result Value Ref Range Status   SARS Coronavirus 2 by RT PCR NEGATIVE NEGATIVE Final    Comment: (NOTE) SARS-CoV-2 target nucleic acids are NOT DETECTED.  The SARS-CoV-2 RNA is generally detectable in upper respiratory specimens during the acute phase of infection. The lowest concentration of SARS-CoV-2 viral copies this  assay can detect is 138 copies/mL. A negative result does not preclude SARS-Cov-2 infection and should not be used as the sole basis for treatment or other patient management decisions. A negative result may occur with  improper specimen collection/handling, submission of specimen other than nasopharyngeal swab, presence of viral mutation(s) within the areas targeted by this assay, and inadequate number of viral copies(<138 copies/mL). A negative result must be combined with clinical observations, patient history, and epidemiological information. The expected result is Negative.  Fact Sheet for Patients:   EntrepreneurPulse.com.au  Fact Sheet for Healthcare Providers:  IncredibleEmployment.be  This test is no t yet approved or cleared by the [place] FDA and  has been authorized for detection and/or diagnosis of SARS-CoV-2 by FDA under an Emergency Use Authorization (EUA). This EUA will remain  in effect (meaning this test can be used) for the duration of the COVID-19 declaration under Section 564(b)(1) of the Act, 21 U.S.C.section 360bbb-3(b)(1), unless the authorization is terminated  or revoked sooner.       Influenza A by PCR NEGATIVE NEGATIVE Final   Influenza B by PCR NEGATIVE NEGATIVE Final    Comment: (NOTE) The Xpert Xpress SARS-CoV-2/FLU/RSV plus assay is intended as an aid in the diagnosis of influenza from Nasopharyngeal swab specimens and should not be used as a sole basis for treatment. Nasal washings and aspirates are unacceptable for Xpert Xpress SARS-CoV-2/FLU/RSV testing.  Fact Sheet for Patients: EntrepreneurPulse.com.au  Fact Sheet for Healthcare Providers: IncredibleEmployment.be  This test is not yet approved or cleared by the [place] FDA and has been authorized for detection and/or diagnosis of SARS-CoV-2 by FDA under an Emergency Use Authorization (EUA). This EUA will remain in effect (meaning this test can be used) for the duration of the COVID-19 declaration under Section 564(b)(1) of the Act, 21 U.S.C. section 360bbb-3(b)(1), unless the authorization is terminated or revoked.  Performed at [facility], [place] [address], [place], [place] [address]          Radiology Studies: DG C-Arm 1-60 Min-No Report  Result Date: 03/18/2021 Fluoroscopy was utilized by the requesting physician.  No radiographic interpretation.   DG FEMUR, MIN 2 VIEWS RIGHT  Result Date: 03/18/2021 CLINICAL DATA:  Portable operative imaging for right proximal femur fracture ORIF.  EXAM: OPERATIVE RIGHT HIP (WITH PELVIS IF PERFORMED) 6 VIEWS TECHNIQUE: Fluoroscopic spot image(s) were submitted for interpretation post-operatively. COMPARISON:  03/17/2021 FINDINGS: Portable images show placement of intramedullary rod supporting a compression screw, reducing the intertrochanteric fracture components into near anatomic alignment. The orthopedic hardware is well seated. IMPRESSION: Well aligned proximal right femur fracture following ORIF. Electronically Signed   By: Lajean Manes M.D.   On: 03/18/2021 11:24        Scheduled Meds:  aspirin EC  325 mg Oral Q breakfast   docusate sodium  100 mg Oral BID   ezetimibe  10 mg Oral Daily   feeding supplement  237 mL Oral BID BM   levETIRAcetam  500 mg Oral QHS   oxybutynin  10 mg Oral QHS   Continuous Infusions:     LOS: 3 days    Time spent: 37 minutes spent on chart review, discussion with nursing staff, consultants, updating family and interview/physical exam; more than 50% of that time was spent in counseling and/or coordination of care.    Nedra Mcinnis J [place] DO [facility] Available via Epic secure chat 7am-7pm After these hours, please refer to coverage provider listed on [email or web address] 03/20/2021, 10:21 AM

## 2021-03-20 NOTE — Progress Notes (Signed)
Physical Therapy Treatment Patient Details Name: [name] MRN: [id number] DOB: January 16, 1930 Today's Date: 03/20/2021   History of Present Illness 87 yo female presenting to ED on 11/24 with fall and R hip pain. Sustained R acute intertrochanteric hip fx. S/p IM nail placement at [facility] on 11/25. PMH including anemia, arthritis, CAD s/p stent, HTN, NSTEMI (2020), HTN, MI, back sx, R ORIF of femur fx (2021), and R TKA (2022).    PT Comments    Pt continues very cooperative and agreeable to therex program this am.  OOB deferred to after transfusion completed.   Recommendations for follow up therapy are one component of a multi-disciplinary discharge planning process, led by the attending physician.  Recommendations may be updated based on patient status, additional functional criteria and insurance authorization.  Follow Up Recommendations  Home health PT     Assistance Recommended at Discharge Frequent or constant Supervision/Assistance  Equipment Recommendations  None recommended by PT    Recommendations for Other Services       Precautions / Restrictions Precautions Precautions: Fall Restrictions Weight Bearing Restrictions: No RLE Weight Bearing: Weight bearing as tolerated     Mobility  Bed Mobility               General bed mobility comments: OOB deferred to after blood transfusion    Transfers                        Ambulation/Gait                   Stairs             Wheelchair Mobility    Modified Rankin (Stroke Patients Only)       Balance                                            Cognition Arousal/Alertness: Awake/alert Behavior During Therapy: WFL for tasks assessed/performed Overall Cognitive Status: Within Functional Limits for tasks assessed                                          Exercises General Exercises - Lower Extremity Ankle Circles/Pumps: AROM;Both;15  reps;Supine Quad Sets: AROM;Both;10 reps;Supine Short Arc Quad: AAROM;AROM;Right;15 reps;Supine Heel Slides: AAROM;Right;20 reps;Supine Hip ABduction/ADduction: AAROM;Right;15 reps;Supine    General Comments        Pertinent Vitals/Pain Pain Assessment: 0-10 Pain Score: 6  Pain Location: RLE Pain Descriptors / Indicators: Aching;Sore Pain Intervention(s): Limited activity within patient's tolerance;Monitored during session;Premedicated before session;Ice applied    Home Living                          Prior Function            PT Goals (current goals can now be found in the care plan section) Acute Rehab PT Goals Patient Stated Goal: Regain IND PT Goal Formulation: With patient Time For Goal Achievement: 04/01/21 Potential to Achieve Goals: Good Progress towards PT goals: Progressing toward goals    Frequency    Min 6X/week      PT Plan Current plan remains appropriate    Co-evaluation              AM-PAC  PT "6 Clicks" Mobility   Outcome Measure  Help needed turning from your back to your side while in a flat bed without using bedrails?: A Lot Help needed moving from lying on your back to sitting on the side of a flat bed without using bedrails?: A Lot Help needed moving to and from a bed to a chair (including a wheelchair)?: A Lot Help needed standing up from a chair using your arms (e.g., wheelchair or bedside chair)?: A Lot Help needed to walk in hospital room?: A Lot Help needed climbing 3-5 steps with a railing? : Total 6 Click Score: 11    End of Session Equipment Utilized During Treatment: Gait belt Activity Tolerance: Patient tolerated treatment well;Patient limited by pain Patient left: in bed;with call bell/phone within reach;with bed alarm set;with nursing/sitter in room Nurse Communication: Mobility status PT Visit Diagnosis: Difficulty in walking, not elsewhere classified (R26.2)     Time: 3570-1779 PT Time Calculation  (min) (ACUTE ONLY): 20 min  Charges:  $Therapeutic Exercise: 8-22 mins                     [place] Pager [phone] Office [phone]    Hiedi [place] 03/20/2021, 8:38 AM

## 2021-03-20 NOTE — TOC Transition Note (Addendum)
Transition of Care [facility]) - CM/SW Discharge Note   Patient Details  Name: [name] MRN: [id number] Date of Birth: 1930/04/16  Transition of Care [facility] CM/SW Contact:  Trevis Eden, Marta Lamas, LCSW Phone Number: 03/20/2021, 1:30 PM   Clinical Narrative:     TOC order received for HHPT, [place] and [place].  Spoke with patient who reported preference in [place], having been pleased with utilizing their services in the past.  Referral for HHPT, [place] and [place] placed with Corene Cornea, representative with [place].   Patient Goals and CMS Choice   Home with [place], [place] and [place].  Discharge Placement   N/A  Discharge Plan and Services     Home with [place], [place], HHRN through [place].        Social Determinants of Health (SDOH) Interventions   Home Health PT, OT and RN.  Readmission Risk Interventions Readmission Risk Prevention Plan 12/23/2019  Transportation Screening Complete  PCP or Specialist Appt within 3-5 Days Not Complete  Not Complete comments DC to SNF  [place] or [place] Not Complete  HRI or Home Care Consult comments Pt dc to SNF  Social Work Consult for [place] Planning/Counseling Not Complete  SW consult not completed comments DC to SNF  Palliative Care Screening Not Applicable  Medication Review Press photographer) Complete  Some recent data might be hidden   Nat Christen, BSW, MSW, [employer]  Licensed Clinical Social Worker  [employer]  Mailing Address-[address], [place], [place] [address] Physical Address-[address], [place], [place] [address] Toll Free Main # [phone] Fax # [phone] Cell # [phone]  [email or web address]

## 2021-03-20 NOTE — Plan of Care (Signed)
  Problem: Pain Managment: Goal: General experience of comfort will improve Outcome: Progressing   Problem: Safety: Goal: Ability to remain free from injury will improve Outcome: Progressing   

## 2021-03-20 NOTE — Plan of Care (Signed)
  Problem: Safety: Goal: Ability to remain free from injury will improve Outcome: Progressing   Problem: Pain Management: Goal: Pain level will decrease Outcome: Progressing   

## 2021-03-21 DIAGNOSIS — S72001A Fracture of unspecified part of neck of right femur, initial encounter for closed fracture: Secondary | ICD-10-CM | POA: Diagnosis not present

## 2021-03-21 DIAGNOSIS — D62 Acute posthemorrhagic anemia: Secondary | ICD-10-CM | POA: Diagnosis not present

## 2021-03-21 DIAGNOSIS — E860 Dehydration: Secondary | ICD-10-CM | POA: Diagnosis not present

## 2021-03-21 DIAGNOSIS — N19 Unspecified kidney failure: Secondary | ICD-10-CM | POA: Diagnosis not present

## 2021-03-21 LAB — CBC
HCT: 29.5 % — ABNORMAL LOW (ref 36.0–46.0)
Hemoglobin: 9.6 g/dL — ABNORMAL LOW (ref 12.0–15.0)
MCH: 31.5 pg (ref 26.0–34.0)
MCHC: 32.5 g/dL (ref 30.0–36.0)
MCV: 96.7 fL (ref 80.0–100.0)
Platelets: 118 10*3/uL — ABNORMAL LOW (ref 150–400)
RBC: 3.05 MIL/uL — ABNORMAL LOW (ref 3.87–5.11)
RDW: 14.1 % (ref 11.5–15.5)
WBC: 7 10*3/uL (ref 4.0–10.5)
nRBC: 0 % (ref 0.0–0.2)

## 2021-03-21 LAB — TYPE AND SCREEN
ABO/RH(D): O POS
Antibody Screen: NEGATIVE
Unit division: 0

## 2021-03-21 LAB — BASIC METABOLIC PANEL
Anion gap: 3 — ABNORMAL LOW (ref 5–15)
BUN: 22 mg/dL (ref 8–23)
CO2: 31 mmol/L (ref 22–32)
Calcium: 8.6 mg/dL — ABNORMAL LOW (ref 8.9–10.3)
Chloride: 103 mmol/L (ref 98–111)
Creatinine, Ser: 0.77 mg/dL (ref 0.44–1.00)
GFR, Estimated: >60 mL/min (ref >60)
Glucose, Bld: 96 mg/dL (ref 70–99)
Potassium: 4.3 mmol/L (ref 3.5–5.1)
Sodium: 137 mmol/L (ref 135–145)

## 2021-03-21 LAB — BPAM RBC
Blood Product Expiration Date: 202212282359
ISSUE DATE / TIME: 202211270944
Unit Type and Rh: 5100

## 2021-03-21 LAB — MAGNESIUM: Magnesium: 1.7 mg/dL (ref 1.7–2.4)

## 2021-03-21 NOTE — Progress Notes (Signed)
Physical Therapy Treatment Patient Details Name: [name] MRN: [id number] DOB: 07-Feb-1930 Today's Date: 03/21/2021   History of Present Illness 87 yo female presenting to ED on 11/24 with fall and R hip pain. Sustained R acute intertrochanteric hip fx. S/p IM nail placement at [facility] on 11/25. PMH including anemia, arthritis, CAD s/p stent, HTN, NSTEMI (2020), HTN, MI, back sx, R ORIF of femur fx (2021), and R TKA (2022).    PT Comments    POD # 3 am session Pt OOB in recliner with 2 family members in room.  Pt was assisted OOB to [facility] by nursing staff just prior. Pt is AxO x 3 very pleasant.  Assisted with amb first.  General transfer comment: 50% VC's on proper hand placement and increased time. General Gait Details: cues for sequence, posture and position from RW.  Pt present with poor forward flex posture and short shuffled steps.  Daughter asssisted by following with recliner. Monitoring RA.  Lowest was 91%.  Returned to room and performed a few TE's followed by ICE. Pt plans to return home with 24/7 family care.    Recommendations for follow up therapy are one component of a multi-disciplinary discharge planning process, led by the attending physician.  Recommendations may be updated based on patient status, additional functional criteria and insurance authorization.  Follow Up Recommendations  Home health PT     Assistance Recommended at Discharge Frequent or constant Supervision/Assistance  Equipment Recommendations  None recommended by PT    Recommendations for Other Services       Precautions / Restrictions Precautions Precautions: Fall Precaution Comments: monitor oxygen Restrictions Weight Bearing Restrictions: No RLE Weight Bearing: Weight bearing as tolerated     Mobility  Bed Mobility               General bed mobility comments: OOB in recliner    Transfers Overall transfer level: Needs assistance Equipment used: Rolling walker (2  wheels) Transfers: Sit to/from Stand Sit to Stand: Min guard;Min assist           General transfer comment: 50% VC's on proper hand placement and increased time.    Ambulation/Gait Ambulation/Gait assistance: Min assist;+2 safety/equipment Gait Distance (Feet): 25 Feet Assistive device: Rolling walker (2 wheels) Gait Pattern/deviations: Step-to pattern;Decreased step length - right;Decreased step length - left;Shuffle;Trunk flexed Gait velocity: decr     General Gait Details: cues for sequence, posture and position from RW.  Pt present with poor forward flex posture and short shuffled steps.  Daughter asssisted by following with recliner.   Stairs             Wheelchair Mobility    Modified Rankin (Stroke Patients Only)       Balance                                            Cognition Arousal/Alertness: Awake/alert Behavior During Therapy: WFL for tasks assessed/performed Overall Cognitive Status: Within Functional Limits for tasks assessed                                 General Comments: AxO x 3 very pleasant Lady.        Exercises  R LE TE's  10 reps ankle pumps 05 reps knee presses 05 reps heel slides 05 reps SAQ's  05 reps ABD Instructed how to use a belt loop to assist  Followed by ICE     General Comments        Pertinent Vitals/Pain Pain Score: 8  Pain Location: R Hip Pain Descriptors / Indicators: Aching;Sore;Operative site guarding Pain Intervention(s): Monitored during session;Premedicated before session;Repositioned;Ice applied    Home Living                          Prior Function            PT Goals (current goals can now be found in the care plan section) Progress towards PT goals: Progressing toward goals    Frequency           PT Plan Current plan remains appropriate    Co-evaluation              AM-PAC PT "6 Clicks" Mobility   Outcome Measure  Help needed  turning from your back to your side while in a flat bed without using bedrails?: A Lot Help needed moving from lying on your back to sitting on the side of a flat bed without using bedrails?: A Lot Help needed moving to and from a bed to a chair (including a wheelchair)?: A Lot Help needed standing up from a chair using your arms (e.g., wheelchair or bedside chair)?: A Lot Help needed to walk in hospital room?: A Lot Help needed climbing 3-5 steps with a railing? : Total 6 Click Score: 11    End of Session Equipment Utilized During Treatment: Gait belt Activity Tolerance: Patient limited by fatigue Patient left: in chair;with call bell/phone within reach;with chair alarm set;with family/visitor present   PT Visit Diagnosis: Difficulty in walking, not elsewhere classified (R26.2)     Time: 1105-1130 PT Time Calculation (min) (ACUTE ONLY): 25 min  Charges:  $Gait Training: 8-22 mins $Therapeutic Exercise: 8-22 mins                     {Sarahjane Matherly  PTA Acute  Rehabilitation Services Pager      [phone] Office      [phone]

## 2021-03-21 NOTE — Progress Notes (Signed)
   Subjective:  [name] is a 87 y.o. female, 3 Days Post-Op   s/p Procedure(s): INTRAMEDULLARY (IM) NAIL FEMORAL   Patient reports pain as mild to moderate.  Patient reports that she was just moved so she has increased pain rated as a 8 or 9 out of 10 but this improves quickly as she rests.   She does not like to take the pain medication.  Denies numbness or tingling.  Denies fever or chills.  Reports tightness in the calf but no pain or swelling.  She reports her calf felt tight prior to this.  She is accompanied by her family.   Objective:   VITALS:   Vitals:   03/20/21 2150 03/21/21 0500 03/21/21 0622 03/21/21 1425  BP: (!) 133/53  (!) 146/60 (!) 137/59  Pulse: 61  76 80  Resp:    16  Temp: 98.6 F (37 C)  98.5 F (36.9 C) 98.3 F (36.8 C)  TempSrc: Oral  Oral Oral  SpO2: 97%  95% 94%  Weight:  49.4 kg    Height:        Constitutional: General Appearance: healthy-appearing, well-nourished, and well-developed.in hospital chair Level of Distress: no acute distress. Eyes: Lens (normal) clear: both eyes. Head: Head: normocephalic and atraumatic. Lungs: Respiratory effort: no dyspnea. Skin: Inspection and palpation: no rash, lesions Neurologic: Cranial Nerves: grossly intact. Sensation: grossly intact. Psychiatric: Insight: good judgement and insight. Mental Status: normal mood and affect and active and alert. Orientation: to time, place, and person.  Right lower extremity:  Compartments soft.  Lateral hip dressing clean dry and intact without drainage.  Dorsi flexion plantar flexion intact.  Neurovascularly intact.  DP pulse 2+.  No tenderness to the knee.  Well-healed anterior knee incision.  Lab Results  Component Value Date   WBC 7.0 03/21/2021   HGB 9.6 (L) 03/21/2021   HCT 29.5 (L) 03/21/2021   MCV 96.7 03/21/2021   PLT 118 (L) 03/21/2021   BMET    Component Value Date/Time   NA 137 03/21/2021 0353   K 4.3 03/21/2021 0353   CL 103 03/21/2021 0353    CO2 31 03/21/2021 0353   GLUCOSE 96 03/21/2021 0353   BUN 22 03/21/2021 0353   CREATININE 0.77 03/21/2021 0353   CALCIUM 8.6 (L) 03/21/2021 0353   GFRNONAA >60 03/21/2021 0353     Assessment/Plan: 3 Days Post-Op   Principal Problem:   Closed right hip fracture (HCC) Active Problems:   Essential hypertension   Seizure disorder (HCC)   Dehydration   Acute prerenal azotemia   Fall at home, initial encounter   HLD (hyperlipidemia)   Acute postoperative anemia due to expected blood loss   IDA (iron deficiency anemia)   Advance diet Up with therapy Dispo: Per primary, discharge home with home health per primary and PT notes.  Okay for discharge from orthopedic standpoint.  Weightbearing Status: WBAT DVT Prophylaxis: 325 mg aspirin in patient, resume 81 mg daily upon discharge Pain medication sent by Dr. Stann Mainland to pharmacy on file.  Follow-up in the office in 2 weeks.  Faythe Casa 03/21/2021, 2:34 PM  Jonelle Sidle PA-C  Physician Assistant with Dr. Lillia Abed [facility]

## 2021-03-21 NOTE — Progress Notes (Signed)
Physical Therapy Treatment Patient Details Name: [name] MRN: [id number] DOB: Jul 11, 1929 Today's Date: 03/21/2021   History of Present Illness 87 yo female presenting to ED on 11/24 with fall and R hip pain. Sustained R acute intertrochanteric hip fx. S/p IM nail placement at [facility] on 11/25. PMH including anemia, arthritis, CAD s/p stent, HTN, NSTEMI (2020), HTN, MI, back sx, R ORIF of femur fx (2021), and R TKA (2022).    PT Comments    POD # 3 pm session Assisted out of recliner to attempt amb however pt too fatigued.  General transfer comment: required increased assist this afternoon with difficulty self rising from recliner and completing 1/4 pivot to [facility].  Then assisted from [facility] to bed with posterior lean and increased time to weightshift onto R LE due to pain.General Gait Details: Transfer only this session due to increased c/o fatigue and pain this afternoon.. Assisted back to bed and positioned to comfort.  Also applied ICE.   Pt plans to D/C to home with 24/7 family support tomorrow.  Pt has a wheelchair to get from care to house.    Recommendations for follow up therapy are one component of a multi-disciplinary discharge planning process, led by the attending physician.  Recommendations may be updated based on patient status, additional functional criteria and insurance authorization.  Follow Up Recommendations  Home health PT     Assistance Recommended at Discharge Frequent or constant Supervision/Assistance  Equipment Recommendations  None recommended by PT    Recommendations for Other Services       Precautions / Restrictions Precautions Precautions: Fall Precaution Comments: monitor oxygen Restrictions Weight Bearing Restrictions: No RLE Weight Bearing: Weight bearing as tolerated     Mobility  Bed Mobility Overal bed mobility: Needs Assistance Bed Mobility: Sit to Supine       Sit to supine: Max assist   General bed mobility comments: Max Asisst  back to bed    Transfers Overall transfer level: Needs assistance Equipment used: Rolling walker (2 wheels) Transfers: Sit to/from Stand Sit to Stand: Mod assist;Max assist           General transfer comment: required increased assist this afternoon with difficulty self rising from recliner and completing 1/4 pivot to [facility].  Then assisted from [facility] to bed with posterior lean and increased time to weightshift onto R LE due to pain.    Ambulation/Gait Ambulation/Gait assistance: Min assist;+2 safety/equipment Gait Distance (Feet): 25 Feet Assistive device: Rolling walker (2 wheels) Gait Pattern/deviations: Step-to pattern;Decreased step length - right;Decreased step length - left;Shuffle;Trunk flexed Gait velocity: decreased     General Gait Details: Transfer only this session due to increased c/o fatigue and pain this afternoon.   Stairs             Wheelchair Mobility    Modified Rankin (Stroke Patients Only)       Balance                                            Cognition Arousal/Alertness: Awake/alert Behavior During Therapy: WFL for tasks assessed/performed Overall Cognitive Status: Within Functional Limits for tasks assessed                                 General Comments: AxO x 3 very pleasant Lady but tired  this afternoon.        Exercises      General Comments        Pertinent Vitals/Pain Pain Assessment: 0-10 Pain Score: 8  Pain Location: R Hip Pain Descriptors / Indicators: Aching;Sore;Operative site guarding Pain Intervention(s): Monitored during session;Repositioned;Ice applied;Patient requesting pain meds-RN notified    Home Living                          Prior Function            PT Goals (current goals can now be found in the care plan section) Progress towards PT goals: Progressing toward goals    Frequency    Min 6X/week      PT Plan Current plan remains appropriate     Co-evaluation              AM-PAC PT "6 Clicks" Mobility   Outcome Measure  Help needed turning from your back to your side while in a flat bed without using bedrails?: A Lot Help needed moving from lying on your back to sitting on the side of a flat bed without using bedrails?: A Lot Help needed moving to and from a bed to a chair (including a wheelchair)?: A Lot Help needed standing up from a chair using your arms (e.g., wheelchair or bedside chair)?: A Lot Help needed to walk in hospital room?: A Lot Help needed climbing 3-5 steps with a railing? : Total 6 Click Score: 11    End of Session Equipment Utilized During Treatment: Gait belt Activity Tolerance: Patient limited by fatigue Patient left: in bed;with call bell/phone within reach;with family/visitor present;with bed alarm set Nurse Communication: Mobility status;Patient requests pain meds PT Visit Diagnosis: Difficulty in walking, not elsewhere classified (R26.2)     Time: 0881-1031 PT Time Calculation (min) (ACUTE ONLY): 25 min  Charges:  $Therapeutic Activity: 23-37 mins                     Rica Koyanagi  PTA Acute  Rehabilitation Services Pager      [phone] Office      [phone]

## 2021-03-21 NOTE — Progress Notes (Signed)
PROGRESS NOTE    [name]  HGD:[id number] DOB: [birth year] DOA: 03/17/2021 PCP: Bernerd Limbo, MD    Brief Narrative:  [name] is an 87 year old female with past medical history significant for essential hypertension, hyperlipidemia, seizure disorder who presented to [facility] long hospital after mechanical fall with subsequent right hip pain and inability to ambulate due to acute right intratrochanteric hip fracture.  No loss of consciousness.  Orthopedics was consulted and patient was transferred to [facility] for surgical intervention.  Hospital service consulted for further evaluation management.   Assessment & Plan:   Principal Problem:   Closed right hip fracture [facility]) Active Problems:   Essential hypertension   Seizure disorder (HCC)   Dehydration   Acute prerenal azotemia   Fall at home, initial encounter   HLD (hyperlipidemia)   Acute postoperative anemia due to expected blood loss   IDA (iron deficiency anemia)   Right hip intratrochanteric fracture Patient presenting to the ED following mechanical fall without loss of consciousness.  Inability to ambulate with acute pain.  Imaging notable for acute right hip intratrochanteric fracture.  Orthopedics was consulted and patient underwent intramedullary narrowly nailing by Dr. Stann Mainland on 03/18/2021.  Vitamin D 25 hydroxy 58.88. --Postoperative DVT prophylaxis with aspirin 325mg  PO daily per orthopedics --WBAT --Norco 5-325mg  PO q4h PRN moderate pain --Morphine 3 mg IV q2h PRN severe breakthrough pain --Robaxin 500 mg p.o. q6h PRN muscle spasm --PT/OT currently recommending home health; home health orders placed.  Postoperative anemia Anemia panel with iron 19, TIBC low at 230, ferritin 132, folate 4.6, B12 255. --Hgb 10.1>8.8>7.5>7.1>9.6,>9.6 --Transfuse 1 unit PRBC today; repeat hemoglobin posttransfusion --IV iron daily x 2; to receive second dose today --Repeat CBC in a.m. --Transfuse for  hemoglobin less than 7.0  Acute respiratory failure: Resolved Likely multifactorial with anemia coupled with likely postoperative atelectasis.  Transfuse 1 unit PRBC on 03/20/2021. --Incentive spirometry --Wean oxygen, maintain SPO2 greater than 92%; currently on room air at rest with SPO2 95%, will obtain ambulatory O2 screen today  Essential hypertension On amlodipine 10 mg p.o. daily at home. --BP 125/61 this morning, off of antihypertensives at this time --Continue hold home amlodipine given well-controlled BP --Hydralazine PRN --Continue monitor BP closely  Hx seizure disorder: Keppra 500 mg p.o. nightly  Hyperlipidemia --Zetia 10 mg p.o. daily   DVT prophylaxis: SCDs Start: 03/18/21 1235 SCDs Start: 03/17/21 2332   Code Status: DNR Family Communication: No family present at bedside this morning  Disposition Plan:  Level of care: Med-Surg Status is: Inpatient  Remains inpatient appropriate because: anticipated discharge home likely tomorrow, pending additional IV iron infusion today and amatory O2 screening      Consultants:  [facility]; Dr. Stann Mainland  Procedures:  Intramedullary nail, right femur; Dr. Stann Mainland 03/18/2021  Antimicrobials:  Perioperative cefazolin   Subjective: Patient seen examined at bedside, resting comfortably.  Sitting in bedside chair.  No family present at bedside this morning.  Titrated off of supplemental oxygen at rest.  Still to receive 1 more dose of IV iron today.  We will obtain ambulatory O2 screen to see if needs home O2.  Hemoglobin stable, 9.4 this morning. No other complaints or concerns at this time.  Denies headache, no chest pain, no shortness of breath, no abdominal pain, no fever/chills/night sweats, no nausea/vomiting/diarrhea.  No acute events overnight per nurse staff.  Objective: Vitals:   03/20/21 1228 03/20/21 2150 03/21/21 0500 03/21/21 0622  BP: (!) 141/54 (!) 133/53  (!) 146/60  Pulse: 66 61  76  Resp: 14     Temp:  98.4 F (36.9 C) 98.6 F (37 C)  98.5 F (36.9 C)  TempSrc: Oral Oral  Oral  SpO2: 98% 97%  95%  Weight:   49.4 kg   Height:        Intake/Output Summary (Last 24 hours) at 03/21/2021 1115 Last data filed at 03/21/2021 1045 Gross per 24 hour  Intake 1672.3 ml  Output 1650 ml  Net 22.3 ml   Filed Weights   03/17/21 2101 03/20/21 0500 03/21/21 0500  Weight: 63.5 kg 51.1 kg 49.4 kg    Examination:  General exam: Appears calm and comfortable, elderly in appearance Respiratory system: Clear to auscultation. Respiratory effort normal.  On room air at rest with SPO2 95%.   Cardiovascular system: S1 & S2 heard, RRR. No JVD, murmurs, rubs, gallops or clicks. No pedal edema. Gastrointestinal system: Abdomen is nondistended, soft and nontender. No organomegaly or masses felt. Normal bowel sounds heard. Central nervous system: Alert and oriented. No focal neurological deficits. Extremities: Symmetric 5 x 5 power.  Neurovascular intact Skin: Surgical dressing noted, clean/dry/intact no rashes, lesions or ulcers Psychiatry: Judgement and insight appear normal. Mood & affect appropriate.     Data Reviewed: I have personally reviewed following labs and imaging studies  CBC: Recent Labs  Lab 03/17/21 2109 03/18/21 0342 03/19/21 0315 03/20/21 0339 03/20/21 1540 03/21/21 0353  WBC 6.6 7.0 6.4 5.6  --  7.0  NEUTROABS 4.8  --   --   --   --   --   HGB 10.1* 8.8* 7.5* 7.1* 9.6* 9.6*  HCT 31.7* 28.1* 24.0* 21.8* 30.1* 29.5*  MCV 95.2 97.2 96.8 96.5  --  96.7  PLT 129* 115* 104* 105*  --  580*   Basic Metabolic Panel: Recent Labs  Lab 03/17/21 2109 03/18/21 0342 03/19/21 0315 03/21/21 0353  NA 139 137 134* 137  K 3.8 4.2 4.7 4.3  CL 108 107 101 103  CO2 23 25 26 31   GLUCOSE 138* 121* 148* 96  BUN 20 17 20 22   CREATININE 0.77 0.73 0.74 0.77  CALCIUM 9.1 8.5* 8.3* 8.6*  MG  --  1.7 1.6* 1.7  PHOS  --   --  4.0  --    GFR: Estimated Creatinine Clearance: 37.9 mL/min (by  C-G formula based on SCr of 0.77 mg/dL). Liver Function Tests: Recent Labs  Lab 03/18/21 0342  AST 25  ALT 15  ALKPHOS 85  BILITOT 0.5  PROT 5.5*  ALBUMIN 3.1*   No results for input(s): LIPASE, AMYLASE in the last 168 hours. No results for input(s): AMMONIA in the last 168 hours. Coagulation Profile: Recent Labs  Lab 03/18/21 0342  INR 1.2   Cardiac Enzymes: No results for input(s): CKTOTAL, CKMB, CKMBINDEX, TROPONINI in the last 168 hours. BNP (last 3 results) No results for input(s): PROBNP in the last 8760 hours. HbA1C: No results for input(s): HGBA1C in the last 72 hours. CBG: No results for input(s): GLUCAP in the last 168 hours. Lipid Profile: No results for input(s): CHOL, HDL, LDLCALC, TRIG, CHOLHDL, LDLDIRECT in the last 72 hours. Thyroid Function Tests: No results for input(s): TSH, T4TOTAL, FREET4, T3FREE, THYROIDAB in the last 72 hours. Anemia Panel: Recent Labs    03/19/21 1501  VITAMINB12 255  FOLATE 4.6*  FERRITIN 132  TIBC 230*  IRON 19*  RETICCTPCT 1.7   Sepsis Labs: No results for input(s): PROCALCITON, LATICACIDVEN in the last 168  hours.  Recent Results (from the past 240 hour(s))  Resp Panel by RT-PCR (Flu A&B, Covid) Nasopharyngeal Swab     Status: None   Collection Time: 03/17/21  9:09 PM   Specimen: Nasopharyngeal Swab; Nasopharyngeal(NP) swabs in vial transport medium  Result Value Ref Range Status   SARS Coronavirus 2 by RT PCR NEGATIVE NEGATIVE Final    Comment: (NOTE) SARS-CoV-2 target nucleic acids are NOT DETECTED.  The SARS-CoV-2 RNA is generally detectable in upper respiratory specimens during the acute phase of infection. The lowest concentration of SARS-CoV-2 viral copies this assay can detect is 138 copies/mL. A negative result does not preclude SARS-Cov-2 infection and should not be used as the sole basis for treatment or other patient management decisions. A negative result may occur with  improper specimen  collection/handling, submission of specimen other than nasopharyngeal swab, presence of viral mutation(s) within the areas targeted by this assay, and inadequate number of viral copies(<138 copies/mL). A negative result must be combined with clinical observations, patient history, and epidemiological information. The expected result is Negative.  Fact Sheet for Patients:  EntrepreneurPulse.com.au  Fact Sheet for Healthcare Providers:  IncredibleEmployment.be  This test is no t yet approved or cleared by the [place] FDA and  has been authorized for detection and/or diagnosis of SARS-CoV-2 by FDA under an Emergency Use Authorization (EUA). This EUA will remain  in effect (meaning this test can be used) for the duration of the COVID-19 declaration under Section 564(b)(1) of the Act, 21 U.S.C.section 360bbb-3(b)(1), unless the authorization is terminated  or revoked sooner.       Influenza A by PCR NEGATIVE NEGATIVE Final   Influenza B by PCR NEGATIVE NEGATIVE Final    Comment: (NOTE) The Xpert Xpress SARS-CoV-2/FLU/RSV plus assay is intended as an aid in the diagnosis of influenza from Nasopharyngeal swab specimens and should not be used as a sole basis for treatment. Nasal washings and aspirates are unacceptable for Xpert Xpress SARS-CoV-2/FLU/RSV testing.  Fact Sheet for Patients: EntrepreneurPulse.com.au  Fact Sheet for Healthcare Providers: IncredibleEmployment.be  This test is not yet approved or cleared by the [place] FDA and has been authorized for detection and/or diagnosis of SARS-CoV-2 by FDA under an Emergency Use Authorization (EUA). This EUA will remain in effect (meaning this test can be used) for the duration of the COVID-19 declaration under Section 564(b)(1) of the Act, 21 U.S.C. section 360bbb-3(b)(1), unless the authorization is terminated or revoked.  Performed at [facility], [place] [address] [place], [place] [address]          Radiology Studies: No results found.      Scheduled Meds:  aspirin EC  325 mg Oral Q breakfast   docusate sodium  100 mg Oral BID   ezetimibe  10 mg Oral Daily   feeding supplement  237 mL Oral BID BM   levETIRAcetam  500 mg Oral QHS   oxybutynin  10 mg Oral QHS   Continuous Infusions:  ferric gluconate (FERRLECIT) IVPB 120 mL/hr at 03/20/21 1405      LOS: 4 days    Time spent: 36 minutes spent on chart review, discussion with nursing staff, consultants, updating family and interview/physical exam; more than 50% of that time was spent in counseling and/or coordination of care.    Meeka Cartelli J [place] DO [facility] Available via Epic secure chat 7am-7pm After these hours, please refer to coverage provider listed on [email or web address] 03/21/2021, 11:15 AM

## 2021-03-21 NOTE — Care Management Important Message (Signed)
Important Message  Patient Details IM Letter given to the Patient. Name: [name] MRN: [id number] Date of Birth: [birth year]   Medicare Important Message Given:  Yes     Kerin Salen 03/21/2021, 12:18 PM

## 2021-03-21 NOTE — Progress Notes (Signed)
PHYSICAL THERAPY  SATURATION QUALIFICATIONS: (This note is used to comply with regulatory documentation for home oxygen)  Patient Saturations on Room Air at Rest =91%  Patient Saturations on Room Air while Ambulating 25 feet = 92%   Please briefly explain why patient needs home oxygen:  Pt does NOT require supplemental oxygen during activity.  Rica Koyanagi  PTA Acute  Rehabilitation Services Pager      [phone] Office      [phone]

## 2021-03-22 ENCOUNTER — Ambulatory Visit: Payer: Medicare Other | Admitting: Physical Therapy

## 2021-03-22 ENCOUNTER — Encounter (HOSPITAL_COMMUNITY): Payer: Self-pay | Admitting: Orthopedic Surgery

## 2021-03-22 DIAGNOSIS — S72001A Fracture of unspecified part of neck of right femur, initial encounter for closed fracture: Secondary | ICD-10-CM | POA: Diagnosis not present

## 2021-03-22 DIAGNOSIS — D62 Acute posthemorrhagic anemia: Secondary | ICD-10-CM | POA: Diagnosis not present

## 2021-03-22 DIAGNOSIS — E860 Dehydration: Secondary | ICD-10-CM | POA: Diagnosis not present

## 2021-03-22 DIAGNOSIS — N19 Unspecified kidney failure: Secondary | ICD-10-CM | POA: Diagnosis not present

## 2021-03-22 MED ORDER — EPHEDRINE 5 MG/ML INJ
INTRAVENOUS | Status: AC
  Filled 2021-03-22: qty 5

## 2021-03-22 MED ORDER — FERROUS SULFATE 325 (65 FE) MG PO TBEC: 325.0000 mg | DELAYED_RELEASE_TABLET | Freq: Every day | ORAL | 2 refills | Status: DC

## 2021-03-22 MED ORDER — PHENYLEPHRINE 40 MCG/ML (10ML) SYRINGE FOR IV PUSH (FOR BLOOD PRESSURE SUPPORT)
PREFILLED_SYRINGE | INTRAVENOUS | Status: AC
  Filled 2021-03-22: qty 10

## 2021-03-22 MED ORDER — DOCUSATE SODIUM 100 MG PO CAPS: 100.0000 mg | ORAL_CAPSULE | Freq: Two times a day (BID) | ORAL | 0 refills | Status: DC

## 2021-03-22 NOTE — Plan of Care (Signed)
?  Problem: Activity: ?Goal: Risk for activity intolerance will decrease ?Outcome: Progressing ?  ?Problem: Safety: ?Goal: Ability to remain free from injury will improve ?Outcome: Progressing ?  ?Problem: Pain Managment: ?Goal: General experience of comfort will improve ?Outcome: Progressing ?  ?

## 2021-03-22 NOTE — Discharge Summary (Addendum)
Physician Discharge Summary  [name] ACZ:[id number] DOB: 1929/10/17 DOA: 03/17/2021  PCP: Bernerd Limbo, MD  Admit date: 03/17/2021 Discharge date: 03/22/2021  Admitted From: Home Disposition: Home  Recommendations for Outpatient Follow-up:  Follow up with PCP in 1-2 weeks Follow-up with orthopedics, Dr. Stann Mainland in 2 weeks for wound check staple removal Discontinued home amlodipine Started on ferrous sulfate Please obtain BMP/CBC in one week  Home Health: PT/OT/RN Equipment/Devices: None  Discharge Condition: Stable CODE STATUS: DNR Diet recommendation: Heart healthy diet  History of present illness:  [name] is an 87 year old female with past medical history significant for essential hypertension, hyperlipidemia, seizure disorder who presented to [facility] long hospital after mechanical fall with subsequent right hip pain and inability to ambulate due to acute right intratrochanteric hip fracture.  No loss of consciousness.  Orthopedics was consulted and patient was transferred to [facility] for surgical intervention.  Hospital service consulted for further evaluation management.  Hospital course:  Right hip intratrochanteric fracture Patient presenting to the ED following mechanical fall without loss of consciousness.  Inability to ambulate with acute pain.  Imaging notable for acute right hip intratrochanteric fracture.  Orthopedics was consulted and patient underwent intramedullary narrowly nailing by Dr. Stann Mainland on 03/18/2021.  Vitamin D 25 hydroxy 58.88.  Continue weightbearing as tolerates.  Postoperative DVT prophylaxis with aspirin per orthopedics.  Norco.  Discharging home with home health PT/OT.  Outpatient follow-up with orthopedics, Dr. Stann Mainland 2 weeks.   Postoperative anemia Anemia panel with iron 19, TIBC low at 230, ferritin 132, folate 4.6, B12 255.  Transfuse 1 unit PRBC and received IV iron during hospitalization.  Discharging on ferrous  sulfate 325 mg p.o. daily.  Recommend repeat CBC 1 week.  Hemoglobin 9.6, stable at time of discharge.   Acute respiratory failure: Resolved Likely multifactorial with anemia coupled with likely postoperative atelectasis.  Transfuse 1 unit PRBC on 03/20/2021.   Essential hypertension On amlodipine 10 mg p.o. daily at home.  Blood pressure has been well controlled during hospitalization, will discontinue amlodipine on discharge.  Outpatient follow-up PCP.   Hx seizure disorder: Keppra 500 mg p.o. nightly   Hyperlipidemia Zetia 10 mg p.o. daily  Discharge Diagnoses:  Principal Problem:   Closed right hip fracture (HCC) Active Problems:   Essential hypertension   Seizure disorder ([place])   Fall at home, initial encounter   HLD (hyperlipidemia)   Acute postoperative anemia due to expected blood loss   IDA (iron deficiency anemia)    Discharge Instructions  Discharge Instructions     Call MD for:  difficulty breathing, headache or visual disturbances   Complete by: As directed    Call MD for:  extreme fatigue   Complete by: As directed    Call MD for:  persistant dizziness or light-headedness   Complete by: As directed    Call MD for:  persistant nausea and vomiting   Complete by: As directed    Call MD for:  severe uncontrolled pain   Complete by: As directed    Call MD for:  temperature >100.4   Complete by: As directed    Diet - low sodium heart healthy   Complete by: As directed    Increase activity slowly   Complete by: As directed    No wound care   Complete by: As directed       Allergies as of 03/22/2021       Reactions   Codeine Other (See Comments), Hypertension  Causes a sensation of "pressure" within her head   Latex Itching   Tape Itching        Medication List     STOP taking these medications    amLODipine 10 MG tablet Commonly known as: NORVASC   olmesartan 20 MG tablet Commonly known as: BENICAR   traMADol 50 MG tablet Commonly  known as: Ultram       TAKE these medications    acetaminophen 325 MG tablet Commonly known as: TYLENOL Take 650 mg by mouth every 6 (six) hours as needed for moderate pain.   aspirin EC 81 MG tablet Take 81 mg by mouth daily. Swallow whole.   diphenoxylate-atropine 2.5-0.025 MG tablet Commonly known as: LOMOTIL Take 1 tablet by mouth 4 (four) times daily as needed for diarrhea or loose stools.   docusate sodium 100 MG capsule Commonly known as: COLACE Take 1 capsule (100 mg total) by mouth 2 (two) times daily.   ezetimibe 10 MG tablet Commonly known as: ZETIA Take 1 tablet (10 mg total) by mouth daily.   ferrous sulfate 325 (65 FE) MG EC tablet Take 1 tablet (325 mg total) by mouth daily with breakfast.   fluocinonide ointment 0.05 % Commonly known as: LIDEX Apply 1 application topically 2 (two) times daily as needed (dermatitis).   HYDROcodone-acetaminophen 5-325 MG tablet Commonly known as: NORCO/VICODIN Take 1 tablet by mouth every 4 (four) hours as needed for moderate pain.   levETIRAcetam 500 MG tablet Commonly known as: KEPPRA Take 1 tablet (500 mg total) by mouth 2 (two) times daily. What changed: when to take this   nitroGLYCERIN 0.4 MG SL tablet Commonly known as: NITROSTAT Place 1 tablet (0.4 mg total) under the tongue every 5 (five) minutes as needed for chest pain.   oxybutynin 10 MG 24 hr tablet Commonly known as: DITROPAN-XL Take 10 mg by mouth at bedtime.   VITAMIN D PO Take 1,000 mg by mouth daily at 6 (six) AM.        Follow-up Information     Nicholes Stairs, MD Follow up in 2 week(s).   Specialty: Orthopedic Surgery Contact information: [address] [place] [address] [phone]         [facility].   Specialty: Home Health Services Why: PT, OT, RN        Bernerd Limbo, MD. Schedule an appointment as soon as possible for a visit in 1 week(s).   Specialty: Family  Medicine Contact information: [address] [place] [address] [phone]                Allergies  Allergen Reactions   Codeine Other (See Comments) and Hypertension    Causes a sensation of "pressure" within her head   Latex Itching   Tape Itching    Consultations: Orthopedics, Dr. Stann Mainland   Procedures/Studies: DG Chest 1 View  Result Date: 03/17/2021 CLINICAL DATA:  Status post fall EXAM: CHEST  1 VIEW COMPARISON:  Chest x-ray 12/14/2019 FINDINGS: The heart and mediastinal contours are unchanged. Aortic calcification. No focal consolidation. Persistent coarsened interstitial markings with no overt pulmonary edema. No pleural effusion. No pneumothorax. No acute osseous abnormality. IMPRESSION: No active disease. Electronically Signed   By: Iven Finn M.D.   On: 03/17/2021 22:22   DG Pelvis 1-2 Views  Result Date: 03/17/2021 CLINICAL DATA:  Status post fall EXAM: PELVIS - 1-2 VIEW; RIGHT FEMUR 2 VIEWS COMPARISON:  X-ray right  femur 12/15/2019, x-ray right hip 12/14/2019 FINDINGS: Pelvis: Markedly limited evaluation due to overlapping osseous structures and overlying soft tissues. There is no evidence of pelvic fracture or diastasis. No pelvic bone lesions are seen. Frontal view of the left hip grossly unremarkable. Right femur: Acute intertrochanteric and proximal femoral shaft fracture. Partially visualized total right knee arthroplasty. Femoral shaft markings suggestive of removal of prior surgical hardware. Vascular calcifications. IMPRESSION: 1. Acute intertrochanteric and proximal femoral shaft fracture. 2. No acute displaced fracture or diastasis of the bones of the pelvis. Markedly limited evaluation due to overlapping osseous structures and overlying soft tissues. 3. Frontal view of the left hip negative for acute traumatic injury. 4. Total right knee arthroplasty. Electronically Signed   By: Iven Finn M.D.   On: 03/17/2021 22:25   DG  C-Arm 1-60 Min-No Report  Result Date: 03/18/2021 CLINICAL DATA:  Portable operative imaging for right proximal femur fracture ORIF. EXAM: OPERATIVE RIGHT HIP (WITH PELVIS IF PERFORMED) 6 VIEWS TECHNIQUE: Fluoroscopic spot image(s) were submitted for interpretation post-operatively. COMPARISON:  03/17/2021 FINDINGS: Portable images show placement of intramedullary rod supporting a compression screw, reducing the intertrochanteric fracture components into near anatomic alignment. The orthopedic hardware is well seated. IMPRESSION: Well aligned proximal right femur fracture following ORIF. Electronically Signed   By: Lajean Manes M.D.   On: 03/18/2021 11:24   DG FEMUR, MIN 2 VIEWS RIGHT  Result Date: 03/18/2021 CLINICAL DATA:  Portable operative imaging for right proximal femur fracture ORIF. EXAM: OPERATIVE RIGHT HIP (WITH PELVIS IF PERFORMED) 6 VIEWS TECHNIQUE: Fluoroscopic spot image(s) were submitted for interpretation post-operatively. COMPARISON:  03/17/2021 FINDINGS: Portable images show placement of intramedullary rod supporting a compression screw, reducing the intertrochanteric fracture components into near anatomic alignment. The orthopedic hardware is well seated. IMPRESSION: Well aligned proximal right femur fracture following ORIF. Electronically Signed   By: Lajean Manes M.D.   On: 03/18/2021 11:24   DG FEMUR, MIN 2 VIEWS RIGHT  Result Date: 03/17/2021 CLINICAL DATA:  Status post fall EXAM: PELVIS - 1-2 VIEW; RIGHT FEMUR 2 VIEWS COMPARISON:  X-ray right femur 12/15/2019, x-ray right hip 12/14/2019 FINDINGS: Pelvis: Markedly limited evaluation due to overlapping osseous structures and overlying soft tissues. There is no evidence of pelvic fracture or diastasis. No pelvic bone lesions are seen. Frontal view of the left hip grossly unremarkable. Right femur: Acute intertrochanteric and proximal femoral shaft fracture. Partially visualized total right knee arthroplasty. Femoral shaft markings  suggestive of removal of prior surgical hardware. Vascular calcifications. IMPRESSION: 1. Acute intertrochanteric and proximal femoral shaft fracture. 2. No acute displaced fracture or diastasis of the bones of the pelvis. Markedly limited evaluation due to overlapping osseous structures and overlying soft tissues. 3. Frontal view of the left hip negative for acute traumatic injury. 4. Total right knee arthroplasty. Electronically Signed   By: Iven Finn M.D.   On: 03/17/2021 22:25     Subjective: Patient seen examined bedside, resting comfortably.  Pain well controlled.  No specific complaints or concerns this morning.  Discharging home with home health PT.  Denies headache, no chest pain, no shortness of breath, no abdominal pain.  No acute events overnight per nursing staff.  Discharge Exam: Vitals:   03/22/21 0224 03/22/21 0508  BP: (!) 124/55 (!) 130/91  Pulse: 68 (!) 101  Resp: 18 20  Temp: 97.6 F (36.4 C) 98.1 F (36.7 C)  SpO2: 94% 97%   Vitals:   03/21/21 1425 03/21/21 2117 03/22/21 0224 03/22/21 0508  BP: Marland Kitchen)  137/59 (!) 137/54 (!) 124/55 (!) 130/91  Pulse: 80 93 68 (!) 101  Resp: 16 20 18 20   Temp: 98.3 F (36.8 C) 97.8 F (36.6 C) 97.6 F (36.4 C) 98.1 F (36.7 C)  TempSrc: Oral Oral Oral Oral  SpO2: 94% 94% 94% 97%  Weight:      Height:        General: Pt is alert, awake, not in acute distress, elderly in appearance Cardiovascular: RRR, S1/S2 +, no rubs, no gallops Respiratory: CTA bilaterally, no wheezing, no rhonchi, on room air Abdominal: Soft, NT, ND, bowel sounds + Extremities: no edema, no cyanosis, surgical dressing noted, clean/dry/intact    The results of significant diagnostics from this hospitalization (including imaging, microbiology, ancillary and laboratory) are listed below for reference.     Microbiology: Recent Results (from the past 240 hour(s))  Resp Panel by RT-PCR (Flu A&B, Covid) Nasopharyngeal Swab     Status: None   Collection  Time: 03/17/21  9:09 PM   Specimen: Nasopharyngeal Swab; Nasopharyngeal(NP) swabs in vial transport medium  Result Value Ref Range Status   SARS Coronavirus 2 by RT PCR NEGATIVE NEGATIVE Final    Comment: (NOTE) SARS-CoV-2 target nucleic acids are NOT DETECTED.  The SARS-CoV-2 RNA is generally detectable in upper respiratory specimens during the acute phase of infection. The lowest concentration of SARS-CoV-2 viral copies this assay can detect is 138 copies/mL. A negative result does not preclude SARS-Cov-2 infection and should not be used as the sole basis for treatment or other patient management decisions. A negative result may occur with  improper specimen collection/handling, submission of specimen other than nasopharyngeal swab, presence of viral mutation(s) within the areas targeted by this assay, and inadequate number of viral copies(<138 copies/mL). A negative result must be combined with clinical observations, patient history, and epidemiological information. The expected result is Negative.  Fact Sheet for Patients:  EntrepreneurPulse.com.au  Fact Sheet for Healthcare Providers:  IncredibleEmployment.be  This test is no t yet approved or cleared by the [place] FDA and  has been authorized for detection and/or diagnosis of SARS-CoV-2 by FDA under an Emergency Use Authorization (EUA). This EUA will remain  in effect (meaning this test can be used) for the duration of the COVID-19 declaration under Section 564(b)(1) of the Act, 21 U.S.C.section 360bbb-3(b)(1), unless the authorization is terminated  or revoked sooner.       Influenza A by PCR NEGATIVE NEGATIVE Final   Influenza B by PCR NEGATIVE NEGATIVE Final    Comment: (NOTE) The Xpert Xpress SARS-CoV-2/FLU/RSV plus assay is intended as an aid in the diagnosis of influenza from Nasopharyngeal swab specimens and should not be used as a sole basis for treatment. Nasal washings  and aspirates are unacceptable for Xpert Xpress SARS-CoV-2/FLU/RSV testing.  Fact Sheet for Patients: EntrepreneurPulse.com.au  Fact Sheet for Healthcare Providers: IncredibleEmployment.be  This test is not yet approved or cleared by the [place] FDA and has been authorized for detection and/or diagnosis of SARS-CoV-2 by FDA under an Emergency Use Authorization (EUA). This EUA will remain in effect (meaning this test can be used) for the duration of the COVID-19 declaration under Section 564(b)(1) of the Act, 21 U.S.C. section 360bbb-3(b)(1), unless the authorization is terminated or revoked.  Performed at [facility], [place] [address], [place], [place] [address]      Labs: BNP (last 3 results) No results for input(s): BNP in the last 8760 hours. Basic Metabolic Panel: Recent Labs  Lab 03/17/21 2109  03/18/21 0342 03/19/21 0315 03/21/21 0353  NA 139 137 134* 137  K 3.8 4.2 4.7 4.3  CL 108 107 101 103  CO2 23 25 26 31   GLUCOSE 138* 121* 148* 96  BUN 20 17 20 22   CREATININE 0.77 0.73 0.74 0.77  CALCIUM 9.1 8.5* 8.3* 8.6*  MG  --  1.7 1.6* 1.7  PHOS  --   --  4.0  --    Liver Function Tests: Recent Labs  Lab 03/18/21 0342  AST 25  ALT 15  ALKPHOS 85  BILITOT 0.5  PROT 5.5*  ALBUMIN 3.1*   No results for input(s): LIPASE, AMYLASE in the last 168 hours. No results for input(s): AMMONIA in the last 168 hours. CBC: Recent Labs  Lab 03/17/21 2109 03/18/21 0342 03/19/21 0315 03/20/21 0339 03/20/21 1540 03/21/21 0353  WBC 6.6 7.0 6.4 5.6  --  7.0  NEUTROABS 4.8  --   --   --   --   --   HGB 10.1* 8.8* 7.5* 7.1* 9.6* 9.6*  HCT 31.7* 28.1* 24.0* 21.8* 30.1* 29.5*  MCV 95.2 97.2 96.8 96.5  --  96.7  PLT 129* 115* 104* 105*  --  118*   Cardiac Enzymes: No results for input(s): CKTOTAL, CKMB, CKMBINDEX, TROPONINI in the last 168 hours. BNP: Invalid input(s): POCBNP CBG: No results for input(s): GLUCAP in the  last 168 hours. D-Dimer No results for input(s): DDIMER in the last 72 hours. Hgb A1c No results for input(s): HGBA1C in the last 72 hours. Lipid Profile No results for input(s): CHOL, HDL, LDLCALC, TRIG, CHOLHDL, LDLDIRECT in the last 72 hours. Thyroid function studies No results for input(s): TSH, T4TOTAL, T3FREE, THYROIDAB in the last 72 hours.  Invalid input(s): FREET3 Anemia work up Recent Labs    03/19/21 1501  VITAMINB12 255  FOLATE 4.6*  FERRITIN 132  TIBC 230*  IRON 19*  RETICCTPCT 1.7   Urinalysis    Component Value Date/Time   COLORURINE YELLOW 03/18/2021 0144   APPEARANCEUR CLEAR 03/18/2021 0144   LABSPEC 1.016 03/18/2021 0144   PHURINE 6.0 03/18/2021 0144   GLUCOSEU NEGATIVE 03/18/2021 0144   HGBUR SMALL (A) 03/18/2021 0144   BILIRUBINUR NEGATIVE 03/18/2021 0144   KETONESUR NEGATIVE 03/18/2021 0144   PROTEINUR NEGATIVE 03/18/2021 0144   NITRITE NEGATIVE 03/18/2021 0144   LEUKOCYTESUR NEGATIVE 03/18/2021 0144   Sepsis Labs Invalid input(s): PROCALCITONIN,  WBC,  LACTICIDVEN Microbiology Recent Results (from the past 240 hour(s))  Resp Panel by RT-PCR (Flu A&B, Covid) Nasopharyngeal Swab     Status: None   Collection Time: 03/17/21  9:09 PM   Specimen: Nasopharyngeal Swab; Nasopharyngeal(NP) swabs in vial transport medium  Result Value Ref Range Status   SARS Coronavirus 2 by RT PCR NEGATIVE NEGATIVE Final    Comment: (NOTE) SARS-CoV-2 target nucleic acids are NOT DETECTED.  The SARS-CoV-2 RNA is generally detectable in upper respiratory specimens during the acute phase of infection. The lowest concentration of SARS-CoV-2 viral copies this assay can detect is 138 copies/mL. A negative result does not preclude SARS-Cov-2 infection and should not be used as the sole basis for treatment or other patient management decisions. A negative result may occur with  improper specimen collection/handling, submission of specimen other than nasopharyngeal swab,  presence of viral mutation(s) within the areas targeted by this assay, and inadequate number of viral copies(<138 copies/mL). A negative result must be combined with clinical observations, patient history, and epidemiological information. The expected result is Negative.  Fact Sheet  for Patients:  EntrepreneurPulse.com.au  Fact Sheet for Healthcare Providers:  IncredibleEmployment.be  This test is no t yet approved or cleared by the [place] FDA and  has been authorized for detection and/or diagnosis of SARS-CoV-2 by FDA under an Emergency Use Authorization (EUA). This EUA will remain  in effect (meaning this test can be used) for the duration of the COVID-19 declaration under Section 564(b)(1) of the Act, 21 U.S.C.section 360bbb-3(b)(1), unless the authorization is terminated  or revoked sooner.       Influenza A by PCR NEGATIVE NEGATIVE Final   Influenza B by PCR NEGATIVE NEGATIVE Final    Comment: (NOTE) The Xpert Xpress SARS-CoV-2/FLU/RSV plus assay is intended as an aid in the diagnosis of influenza from Nasopharyngeal swab specimens and should not be used as a sole basis for treatment. Nasal washings and aspirates are unacceptable for Xpert Xpress SARS-CoV-2/FLU/RSV testing.  Fact Sheet for Patients: EntrepreneurPulse.com.au  Fact Sheet for Healthcare Providers: IncredibleEmployment.be  This test is not yet approved or cleared by the [place] FDA and has been authorized for detection and/or diagnosis of SARS-CoV-2 by FDA under an Emergency Use Authorization (EUA). This EUA will remain in effect (meaning this test can be used) for the duration of the COVID-19 declaration under Section 564(b)(1) of the Act, 21 U.S.C. section 360bbb-3(b)(1), unless the authorization is terminated or revoked.  Performed at [facility], [place] [address], [place], [place] [address]      Time  coordinating discharge: Over 30 minutes  SIGNED:   Zaiyden Strozier J [place] DO  [facility] 03/22/2021, 10:35 AM

## 2021-03-22 NOTE — Clinical Social Work Note (Signed)
CSW notified Ramond Marrow with [facility] that patient will be discharging today. CSW met with patient and son to notify them [facility] will be following up with patient after discharge. TOC signing off.

## 2021-03-22 NOTE — Progress Notes (Signed)
Physical Therapy Treatment Patient Details Name: [name] MRN: [id number] DOB: 1929/07/06 Today's Date: 03/22/2021   History of Present Illness 87 yo female presenting to ED on 11/24 with fall and R hip pain. Sustained R acute intertrochanteric hip fx. S/p IM nail placement at [facility] on 11/25. PMH including anemia, arthritis, CAD s/p stent, HTN, NSTEMI (2020), HTN, MI, back sx, R ORIF of femur fx (2021), and R TKA (2022).    PT Comments    POD # 4 Pt D/C today.  Assisted with a car transfer.  General transfer comment: assisted from wheelchair to personal vehicle.  50% VC's on proper hand placement and safety with turns.  assisted with "step up" backward on one step (exercise bench 6 inch rise) into high SUV.  Also assisted by daughter. General Gait Details: assisted with amb a few feet from wheelchair to personal vehicle.   Recommendations for follow up therapy are one component of a multi-disciplinary discharge planning process, led by the attending physician.  Recommendations may be updated based on patient status, additional functional criteria and insurance authorization.  Follow Up Recommendations  Home health PT     Assistance Recommended at Discharge Frequent or constant Supervision/Assistance  Equipment Recommendations  None recommended by PT    Recommendations for Other Services       Precautions / Restrictions Precautions Precautions: Fall Restrictions Weight Bearing Restrictions: No RLE Weight Bearing: Weight bearing as tolerated     Mobility  Bed Mobility               General bed mobility comments: OOB in wheelchair    Transfers Overall transfer level: Needs assistance Equipment used: Rolling walker (2 wheels) Transfers: Sit to/from Stand;Bed to chair/wheelchair/BSC Sit to Stand: Min assist Stand pivot transfers: Mod assist         General transfer comment: assisted from wheelchair to personal vehicle.  50% VC's on proper hand placement and  safety with turns.  assisted with "step up" backward on one step (exercise bench 6 inch rise) into high SUV.  Also assisted by daughter.    Ambulation/Gait Ambulation/Gait assistance: Min assist;+2 safety/equipment Gait Distance (Feet): 2 Feet Assistive device: Rolling walker (2 wheels) Gait Pattern/deviations: Step-to pattern;Decreased step length - right;Decreased step length - left;Shuffle;Trunk flexed Gait velocity: decreased     General Gait Details: assisted with amb a few feet from wheelchair to personal vehicle.   Stairs             Wheelchair Mobility    Modified Rankin (Stroke Patients Only)       Balance                                            Cognition Arousal/Alertness: Awake/alert Behavior During Therapy: WFL for tasks assessed/performed Overall Cognitive Status: Within Functional Limits for tasks assessed                                 General Comments: AxO x 3 very pleasant Lady excited to go home today.        Exercises      General Comments        Pertinent Vitals/Pain Pain Assessment: Faces Faces Pain Scale: Hurts little more Pain Location: R Hip Pain Descriptors / Indicators: Aching;Sore;Operative site guarding Pain Intervention(s): Monitored during session;Repositioned;Limited activity  within patient's tolerance    Home Living                          Prior Function            PT Goals (current goals can now be found in the care plan section) Progress towards PT goals: Progressing toward goals    Frequency    Min 6X/week      PT Plan Current plan remains appropriate    Co-evaluation              AM-PAC PT "6 Clicks" Mobility   Outcome Measure  Help needed turning from your back to your side while in a flat bed without using bedrails?: A Lot Help needed moving from lying on your back to sitting on the side of a flat bed without using bedrails?: A Lot Help needed  moving to and from a bed to a chair (including a wheelchair)?: A Lot Help needed standing up from a chair using your arms (e.g., wheelchair or bedside chair)?: A Lot Help needed to walk in hospital room?: A Lot Help needed climbing 3-5 steps with a railing? : Total 6 Click Score: 11    End of Session   Activity Tolerance: Patient tolerated treatment well Patient left: Other (comment) (personal vehicle)   PT Visit Diagnosis: Difficulty in walking, not elsewhere classified (R26.2)     Time: 7681-1572 PT Time Calculation (min) (ACUTE ONLY): 17 min  Charges:  $Therapeutic Activity: 8-22 mins                     {Ramzi Brathwaite  PTA Acute  Rehabilitation Services Pager      [phone] Office      [phone]

## 2021-03-23 ENCOUNTER — Ambulatory Visit: Payer: Medicare Other | Admitting: Physical Therapy

## 2021-05-25 ENCOUNTER — Encounter: Payer: Self-pay | Admitting: Physical Therapy

## 2021-05-25 ENCOUNTER — Other Ambulatory Visit: Payer: Self-pay

## 2021-05-25 ENCOUNTER — Ambulatory Visit: Payer: Medicare Other | Attending: Orthopedic Surgery | Admitting: Physical Therapy

## 2021-05-25 DIAGNOSIS — M25561 Pain in right knee: Secondary | ICD-10-CM | POA: Insufficient documentation

## 2021-05-25 DIAGNOSIS — M6281 Muscle weakness (generalized): Secondary | ICD-10-CM | POA: Diagnosis present

## 2021-05-25 DIAGNOSIS — R262 Difficulty in walking, not elsewhere classified: Secondary | ICD-10-CM

## 2021-05-25 DIAGNOSIS — M25551 Pain in right hip: Secondary | ICD-10-CM

## 2021-05-25 DIAGNOSIS — M25661 Stiffness of right knee, not elsewhere classified: Secondary | ICD-10-CM | POA: Diagnosis present
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
## 2021-05-30 ENCOUNTER — Encounter: Payer: Self-pay | Admitting: Physical Therapy

## 2021-05-30 ENCOUNTER — Ambulatory Visit: Payer: Medicare Other | Admitting: Physical Therapy

## 2021-05-30 ENCOUNTER — Other Ambulatory Visit: Payer: Self-pay

## 2021-05-30 DIAGNOSIS — M6281 Muscle weakness (generalized): Secondary | ICD-10-CM

## 2021-05-30 DIAGNOSIS — M25551 Pain in right hip: Secondary | ICD-10-CM

## 2021-05-30 DIAGNOSIS — R262 Difficulty in walking, not elsewhere classified: Secondary | ICD-10-CM | POA: Diagnosis not present

## 2021-05-30 DIAGNOSIS — M25661 Stiffness of right knee, not elsewhere classified: Secondary | ICD-10-CM
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
## 2021-06-01 ENCOUNTER — Ambulatory Visit: Payer: Medicare Other | Admitting: Physical Therapy

## 2021-06-01 ENCOUNTER — Other Ambulatory Visit: Payer: Self-pay

## 2021-06-01 ENCOUNTER — Encounter: Payer: Self-pay | Admitting: Physical Therapy

## 2021-06-01 DIAGNOSIS — R262 Difficulty in walking, not elsewhere classified: Secondary | ICD-10-CM

## 2021-06-01 DIAGNOSIS — M6281 Muscle weakness (generalized): Secondary | ICD-10-CM

## 2021-06-01 DIAGNOSIS — M25551 Pain in right hip: Secondary | ICD-10-CM

## 2021-06-01 DIAGNOSIS — M25661 Stiffness of right knee, not elsewhere classified: Secondary | ICD-10-CM
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
## 2021-06-09 ENCOUNTER — Encounter: Payer: Self-pay | Admitting: Physical Therapy

## 2021-06-09 ENCOUNTER — Other Ambulatory Visit: Payer: Self-pay

## 2021-06-09 ENCOUNTER — Ambulatory Visit: Payer: Medicare Other | Admitting: Physical Therapy

## 2021-06-09 DIAGNOSIS — R262 Difficulty in walking, not elsewhere classified: Secondary | ICD-10-CM

## 2021-06-09 DIAGNOSIS — M25561 Pain in right knee: Secondary | ICD-10-CM

## 2021-06-09 DIAGNOSIS — M25551 Pain in right hip: Secondary | ICD-10-CM

## 2021-06-09 DIAGNOSIS — M25661 Stiffness of right knee, not elsewhere classified: Secondary | ICD-10-CM

## 2021-06-09 DIAGNOSIS — M6281 Muscle weakness (generalized): Secondary | ICD-10-CM
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
## 2021-06-16 ENCOUNTER — Other Ambulatory Visit: Payer: Self-pay

## 2021-06-16 ENCOUNTER — Ambulatory Visit: Payer: Medicare Other | Admitting: Physical Therapy

## 2021-06-16 DIAGNOSIS — R262 Difficulty in walking, not elsewhere classified: Secondary | ICD-10-CM | POA: Diagnosis not present

## 2021-06-16 DIAGNOSIS — M25661 Stiffness of right knee, not elsewhere classified: Secondary | ICD-10-CM

## 2021-06-16 DIAGNOSIS — M6281 Muscle weakness (generalized): Secondary | ICD-10-CM
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
## 2021-06-22 ENCOUNTER — Encounter: Payer: Self-pay | Admitting: Physical Therapy

## 2021-06-22 ENCOUNTER — Other Ambulatory Visit: Payer: Self-pay

## 2021-06-22 ENCOUNTER — Ambulatory Visit: Payer: Medicare Other | Attending: Orthopedic Surgery | Admitting: Physical Therapy

## 2021-06-22 DIAGNOSIS — R262 Difficulty in walking, not elsewhere classified: Secondary | ICD-10-CM | POA: Insufficient documentation

## 2021-06-22 DIAGNOSIS — M25561 Pain in right knee: Secondary | ICD-10-CM | POA: Insufficient documentation

## 2021-06-22 DIAGNOSIS — M6281 Muscle weakness (generalized): Secondary | ICD-10-CM | POA: Insufficient documentation

## 2021-06-22 DIAGNOSIS — M25551 Pain in right hip: Secondary | ICD-10-CM | POA: Diagnosis present

## 2021-06-22 DIAGNOSIS — M25661 Stiffness of right knee, not elsewhere classified: Secondary | ICD-10-CM | POA: Diagnosis present
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
## 2021-06-27 ENCOUNTER — Ambulatory Visit: Payer: Medicare Other | Admitting: Physical Therapy

## 2021-06-27 ENCOUNTER — Other Ambulatory Visit: Payer: Self-pay

## 2021-06-27 ENCOUNTER — Encounter: Payer: Self-pay | Admitting: Physical Therapy

## 2021-06-27 DIAGNOSIS — M25561 Pain in right knee: Secondary | ICD-10-CM

## 2021-06-27 DIAGNOSIS — M6281 Muscle weakness (generalized): Secondary | ICD-10-CM

## 2021-06-27 DIAGNOSIS — M25551 Pain in right hip: Secondary | ICD-10-CM

## 2021-06-27 DIAGNOSIS — R262 Difficulty in walking, not elsewhere classified: Secondary | ICD-10-CM | POA: Diagnosis not present

## 2021-06-27 DIAGNOSIS — M25661 Stiffness of right knee, not elsewhere classified: Secondary | ICD-10-CM
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
## 2021-06-30 ENCOUNTER — Encounter: Payer: Self-pay | Admitting: Physical Therapy

## 2021-06-30 ENCOUNTER — Ambulatory Visit: Payer: Medicare Other | Admitting: Physical Therapy

## 2021-06-30 ENCOUNTER — Other Ambulatory Visit: Payer: Self-pay

## 2021-06-30 DIAGNOSIS — M25561 Pain in right knee: Secondary | ICD-10-CM

## 2021-06-30 DIAGNOSIS — M25551 Pain in right hip: Secondary | ICD-10-CM

## 2021-06-30 DIAGNOSIS — M6281 Muscle weakness (generalized): Secondary | ICD-10-CM

## 2021-06-30 DIAGNOSIS — R262 Difficulty in walking, not elsewhere classified: Secondary | ICD-10-CM

## 2021-06-30 DIAGNOSIS — M25661 Stiffness of right knee, not elsewhere classified: Secondary | ICD-10-CM
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
## 2021-07-05 ENCOUNTER — Ambulatory Visit: Payer: Medicare Other | Admitting: Physical Therapy

## 2021-07-05 ENCOUNTER — Other Ambulatory Visit: Payer: Self-pay

## 2021-07-05 DIAGNOSIS — R262 Difficulty in walking, not elsewhere classified: Secondary | ICD-10-CM | POA: Diagnosis not present
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
## 2021-07-07 ENCOUNTER — Ambulatory Visit: Payer: Medicare Other | Admitting: Physical Therapy

## 2021-07-07 ENCOUNTER — Other Ambulatory Visit: Payer: Self-pay

## 2021-07-07 ENCOUNTER — Encounter: Payer: Self-pay | Admitting: Physical Therapy

## 2021-07-07 DIAGNOSIS — M6281 Muscle weakness (generalized): Secondary | ICD-10-CM

## 2021-07-07 DIAGNOSIS — R262 Difficulty in walking, not elsewhere classified: Secondary | ICD-10-CM

## 2021-07-07 DIAGNOSIS — M25561 Pain in right knee: Secondary | ICD-10-CM

## 2021-07-07 DIAGNOSIS — M25661 Stiffness of right knee, not elsewhere classified: Secondary | ICD-10-CM

## 2021-07-07 DIAGNOSIS — M25551 Pain in right hip: Secondary | ICD-10-CM
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
## 2021-07-12 ENCOUNTER — Other Ambulatory Visit: Payer: Self-pay

## 2021-07-12 ENCOUNTER — Encounter: Payer: Self-pay | Admitting: Physical Therapy

## 2021-07-12 ENCOUNTER — Ambulatory Visit: Payer: Medicare Other | Admitting: Physical Therapy

## 2021-07-12 DIAGNOSIS — R262 Difficulty in walking, not elsewhere classified: Secondary | ICD-10-CM

## 2021-07-12 DIAGNOSIS — M25661 Stiffness of right knee, not elsewhere classified: Secondary | ICD-10-CM

## 2021-07-12 DIAGNOSIS — M6281 Muscle weakness (generalized): Secondary | ICD-10-CM

## 2021-07-12 DIAGNOSIS — M25551 Pain in right hip: Secondary | ICD-10-CM

## 2021-07-12 DIAGNOSIS — M25561 Pain in right knee: Secondary | ICD-10-CM
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
## 2021-07-14 ENCOUNTER — Other Ambulatory Visit: Payer: Self-pay

## 2021-07-14 ENCOUNTER — Ambulatory Visit: Payer: Medicare Other | Admitting: Physical Therapy

## 2021-07-14 DIAGNOSIS — R262 Difficulty in walking, not elsewhere classified: Secondary | ICD-10-CM

## 2021-07-14 DIAGNOSIS — M25551 Pain in right hip: Secondary | ICD-10-CM

## 2021-07-14 DIAGNOSIS — M6281 Muscle weakness (generalized): Secondary | ICD-10-CM
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
## 2021-07-19 ENCOUNTER — Ambulatory Visit: Payer: Medicare Other | Admitting: Physical Therapy

## 2021-07-21 ENCOUNTER — Ambulatory Visit: Payer: Medicare Other | Admitting: Physical Therapy

## 2021-07-26 ENCOUNTER — Ambulatory Visit: Payer: Medicare Other | Attending: Student | Admitting: Physical Therapy

## 2021-07-26 DIAGNOSIS — M25551 Pain in right hip: Secondary | ICD-10-CM | POA: Insufficient documentation

## 2021-07-26 DIAGNOSIS — Z96651 Presence of right artificial knee joint: Secondary | ICD-10-CM | POA: Diagnosis present

## 2021-07-26 DIAGNOSIS — M6281 Muscle weakness (generalized): Secondary | ICD-10-CM | POA: Diagnosis present

## 2021-07-26 DIAGNOSIS — R262 Difficulty in walking, not elsewhere classified: Secondary | ICD-10-CM | POA: Insufficient documentation

## 2021-07-26 DIAGNOSIS — M25561 Pain in right knee: Secondary | ICD-10-CM | POA: Diagnosis present

## 2021-07-26 DIAGNOSIS — M25661 Stiffness of right knee, not elsewhere classified: Secondary | ICD-10-CM | POA: Insufficient documentation
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
## 2021-07-28 ENCOUNTER — Ambulatory Visit: Payer: Medicare Other | Admitting: Physical Therapy

## 2021-07-28 DIAGNOSIS — R262 Difficulty in walking, not elsewhere classified: Secondary | ICD-10-CM

## 2021-07-28 DIAGNOSIS — M6281 Muscle weakness (generalized): Secondary | ICD-10-CM
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
## 2021-08-04 ENCOUNTER — Ambulatory Visit: Payer: Medicare Other | Admitting: Physical Therapy

## 2021-08-04 ENCOUNTER — Encounter: Payer: Self-pay | Admitting: Physical Therapy

## 2021-08-04 DIAGNOSIS — M25561 Pain in right knee: Secondary | ICD-10-CM

## 2021-08-04 DIAGNOSIS — M25551 Pain in right hip: Secondary | ICD-10-CM

## 2021-08-04 DIAGNOSIS — R262 Difficulty in walking, not elsewhere classified: Secondary | ICD-10-CM | POA: Diagnosis not present

## 2021-08-04 DIAGNOSIS — Z96651 Presence of right artificial knee joint: Secondary | ICD-10-CM

## 2021-08-04 DIAGNOSIS — M6281 Muscle weakness (generalized): Secondary | ICD-10-CM

## 2021-08-04 DIAGNOSIS — M25661 Stiffness of right knee, not elsewhere classified: Secondary | ICD-10-CM
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
## 2021-08-09 ENCOUNTER — Encounter: Payer: Self-pay | Admitting: Physical Therapy

## 2021-08-09 ENCOUNTER — Ambulatory Visit: Payer: Medicare Other | Admitting: Physical Therapy

## 2021-08-09 DIAGNOSIS — M6281 Muscle weakness (generalized): Secondary | ICD-10-CM

## 2021-08-09 DIAGNOSIS — R262 Difficulty in walking, not elsewhere classified: Secondary | ICD-10-CM | POA: Diagnosis not present

## 2021-08-09 DIAGNOSIS — M25551 Pain in right hip: Secondary | ICD-10-CM
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
## 2021-08-11 ENCOUNTER — Encounter: Payer: Self-pay | Admitting: Physical Therapy

## 2021-08-11 ENCOUNTER — Ambulatory Visit: Payer: Medicare Other | Admitting: Physical Therapy

## 2021-08-11 DIAGNOSIS — R262 Difficulty in walking, not elsewhere classified: Secondary | ICD-10-CM

## 2021-08-11 DIAGNOSIS — M6281 Muscle weakness (generalized): Secondary | ICD-10-CM
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
## 2021-08-16 ENCOUNTER — Ambulatory Visit: Payer: Medicare Other | Admitting: Physical Therapy

## 2021-08-16 ENCOUNTER — Encounter: Payer: Self-pay | Admitting: Physical Therapy

## 2021-08-16 DIAGNOSIS — R262 Difficulty in walking, not elsewhere classified: Secondary | ICD-10-CM

## 2021-08-16 DIAGNOSIS — M6281 Muscle weakness (generalized): Secondary | ICD-10-CM

## 2021-08-16 DIAGNOSIS — M25551 Pain in right hip: Secondary | ICD-10-CM
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
## 2021-08-18 ENCOUNTER — Ambulatory Visit: Payer: Medicare Other | Admitting: Physical Therapy

## 2021-08-18 DIAGNOSIS — R262 Difficulty in walking, not elsewhere classified: Secondary | ICD-10-CM | POA: Diagnosis not present

## 2021-08-18 DIAGNOSIS — M6281 Muscle weakness (generalized): Secondary | ICD-10-CM

## 2021-08-18 DIAGNOSIS — M25551 Pain in right hip: Secondary | ICD-10-CM

## 2021-08-18 NOTE — Patient Instructions (Addendum)
Access Code: [email or web address] ?URL: [email or web address] ?Date: 08/18/2021 ?Prepared by: Lum Babe ? ?Exercises ?- Seated Long Arc Quad with Ankle Weight  - 1 x daily - 7 x weekly - 3 sets - 10 reps - 1 hold ?- Seated Hip Flexion March with Ankle Weights  - 1 x daily - 7 x weekly - 3 sets - 10 reps - 1 hold ?- Seated Ankle Dorsiflexion AROM  - 1 x daily - 7 x weekly - 3 sets - 10 reps - 1 hold ?- Seated Heel Raise  - 1 x daily - 7 x weekly - 2 sets - 10 reps - 1 hold ?- Seated Hip Adduction Squeeze with Ball  - 1 x daily - 7 x weekly - 2 sets - 10 reps - 1 hold ?- Seated Shoulder Extension and Scapular Retraction with Resistance  - 1 x daily - 7 x weekly - 2 sets - 10 reps - 1 hold ?- Seated Shoulder Row with Anchored Resistance  - 1 x daily - 7 x weekly - 2 sets - 10 reps - 1 hold ?- Standing Hip Extension with Ankle Weight  - 1 x daily - 7 x weekly - 2 sets - 10 reps - 1 hold ?- Standing Hip Abduction with Ankle Weight  - 1 x daily - 7 x weekly - 2 sets - 10 reps - 1 hold ?- Standing Marching  - 1 x daily - 7 x weekly - 2 sets - 10 reps - 1 hold ?- Seated Hip Abduction with Resistance  - 1 x daily - 7 x weekly - 2 sets - 10 reps - 1 hold ?- Single Leg Stance with Support  - 1 x daily - 7 x weekly - 1 sets - 10 reps - 10 hold ?Also gave a handout about balance ? ?
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
## 2022-02-01 ENCOUNTER — Encounter: Payer: Self-pay | Admitting: Cardiology

## 2022-02-01 ENCOUNTER — Ambulatory Visit: Payer: Medicare Other | Admitting: Cardiology

## 2022-02-01 VITALS — BP 158/65 | HR 74 | Temp 98.1°F | Resp 16 | Ht 62.0 in | Wt 114.2 lb

## 2022-02-01 DIAGNOSIS — I2510 Atherosclerotic heart disease of native coronary artery without angina pectoris: Secondary | ICD-10-CM

## 2022-02-01 DIAGNOSIS — I10 Essential (primary) hypertension: Secondary | ICD-10-CM

## 2022-02-01 NOTE — Progress Notes (Signed)
Follow up visit  Subjective:   [name], female    DOB: 27-May-1929, 88 y.o.   MRN: [id number]    HPI  Chief Complaint  Patient presents with   Coronary Artery Disease   Follow-up    88 y/o Caucasian female with hypertension, CAD, h/o NSTEMI, h/o seizure disorder  Patient is here with her son.  Her mobility has limited since her hip surgery in 2022. She has no significant chest pain, dyspnea symptoms.    Current Outpatient Medications:    acetaminophen (TYLENOL) 325 MG tablet, Take 650 mg by mouth every 6 (six) hours as needed for moderate pain., Disp: , Rfl:    aspirin EC 81 MG tablet, Take 81 mg by mouth daily. Swallow whole., Disp: , Rfl:    diphenoxylate-atropine (LOMOTIL) 2.5-0.025 MG tablet, Take 1 tablet by mouth 4 (four) times daily as needed for diarrhea or loose stools., Disp: 30 tablet, Rfl: 0   docusate sodium (COLACE) 100 MG capsule, Take 1 capsule (100 mg total) by mouth 2 (two) times daily., Disp: 60 capsule, Rfl: 0   ezetimibe (ZETIA) 10 MG tablet, Take 1 tablet (10 mg total) by mouth daily., Disp: 60 tablet, Rfl: 1   ferrous sulfate 325 (65 FE) MG EC tablet, Take 1 tablet (325 mg total) by mouth daily with breakfast., Disp: 30 tablet, Rfl: 2   fluocinonide ointment (LIDEX) 0.16 %, Apply 1 application topically 2 (two) times daily as needed (dermatitis)., Disp: , Rfl:    HYDROcodone-acetaminophen (NORCO/VICODIN) 5-325 MG tablet, Take 1 tablet by mouth every 4 (four) hours as needed for moderate pain., Disp: 20 tablet, Rfl: 0   levETIRAcetam (KEPPRA) 500 MG tablet, Take 1 tablet (500 mg total) by mouth 2 (two) times daily. (Patient taking differently: Take 500 mg by mouth at bedtime.), Disp: 60 tablet, Rfl: 0   nitroGLYCERIN (NITROSTAT) 0.4 MG SL tablet, Place 1 tablet (0.4 mg total) under the tongue every 5 (five) minutes as needed for chest pain., Disp: 20 tablet, Rfl: 0   oxybutynin (DITROPAN-XL) 10 MG 24 hr tablet, Take 10 mg by mouth at bedtime., Disp: ,  Rfl:    VITAMIN D PO, Take 1,000 mg by mouth daily at 6 (six) AM., Disp: , Rfl:   Cardiovascular & other pertient studies:  EKG 02/01/2022: Sinus rhythm 74 bpm  Old anteroseptal infarct  Echocardiogram 12/20/2019:  1. Limited study.   2. Left ventricular ejection fraction, by estimation, is 60 to 65%. The  left ventricle has normal function. The left ventricle has no regional  wall motion abnormalities. There is mild left ventricular hypertrophy.  Left ventricular diastolic function  could not be evaluated.   3. Right ventricular systolic function is normal. The right ventricular  size is normal. There is moderately elevated pulmonary artery systolic  pressure. The estimated right ventricular systolic pressure is 01.0 mmHg.   4. The mitral valve is abnormal, mildly thickened and calcified. Trivial  mitral valve regurgitation.   5. The aortic valve is tricuspid. Aortic valve regurgitation is not  visualized. Mild aortic valve sclerosis is present, with no evidence of  aortic valve stenosis.   6. The inferior vena cava is normal in size with greater than 50%  respiratory variability, suggesting right atrial pressure of 3 mmHg.   Recent labs: 01/25/2022: Glucose 94, BUN/Cr 24/0.77. EGFR 74. Na/K 139/3.9. Rest of the CMP normal H/H 11/32. MCV 97. Platelets 126  01/26/2021: Chol 132, TG 64, HDL 58, LDL 61  Review of Systems  Cardiovascular:  Negative for chest pain, dyspnea on exertion, leg swelling, palpitations and syncope.  Musculoskeletal:  Positive for joint pain.         Vitals:   02/01/22 1354  BP: (!) 158/65  Pulse: 74  Resp: 16  Temp: 98.1 F (36.7 C)  SpO2: 95%     Body mass index is 20.89 kg/m. Filed Weights   02/01/22 1354  Weight: 114 lb 3.2 oz (51.8 kg)     Objective:   Physical Exam Vitals and nursing note reviewed.  Constitutional:      General: She is not in acute distress. Neck:     Vascular: No JVD.  Cardiovascular:     Rate and Rhythm:  Normal rate and regular rhythm.     Heart sounds: Normal heart sounds. No murmur heard. Pulmonary:     Effort: Pulmonary effort is normal.     Breath sounds: Normal breath sounds. No wheezing or rales.  Musculoskeletal:     Right lower leg: No edema.     Left lower leg: No edema.           Assessment & Recommendations:   88 y/o Caucasian female with hypertension, CAD, h/o NSTEMI, h/o seizure disorder  CAD: Stable. No ongoing angina symptoms. Continue Aspirin, Zetia. Reasonable to omit statin given age and relative stability of CAD  Hypertension: BP elevated but generally well controlled. No change made today. Continue f/u w/PCP.  F/u in 1 year   Nigel Mormon, MD Pager: [phone] Office: [phone]

## 2022-02-03 ENCOUNTER — Encounter: Payer: Self-pay | Admitting: Cardiology

## 2022-03-02 ENCOUNTER — Ambulatory Visit: Payer: Medicare Other | Attending: Family Medicine | Admitting: Physical Therapy

## 2022-03-02 ENCOUNTER — Encounter: Payer: Self-pay | Admitting: Physical Therapy

## 2022-03-02 DIAGNOSIS — R2681 Unsteadiness on feet: Secondary | ICD-10-CM | POA: Diagnosis present

## 2022-03-02 DIAGNOSIS — R262 Difficulty in walking, not elsewhere classified: Secondary | ICD-10-CM | POA: Insufficient documentation

## 2022-03-02 DIAGNOSIS — M6281 Muscle weakness (generalized): Secondary | ICD-10-CM | POA: Diagnosis present
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
## 2022-03-09 ENCOUNTER — Ambulatory Visit: Payer: Medicare Other | Admitting: Physical Therapy

## 2022-03-20 ENCOUNTER — Ambulatory Visit: Payer: Medicare Other | Admitting: Podiatry

## 2022-03-23 ENCOUNTER — Ambulatory Visit: Payer: Medicare Other | Admitting: Physical Therapy

## 2022-03-28 ENCOUNTER — Ambulatory Visit: Payer: Medicare Other | Admitting: Physical Therapy

## 2022-05-11 ENCOUNTER — Emergency Department (HOSPITAL_COMMUNITY)
Admission: EM | Admit: 2022-05-11 | Discharge: 2022-05-12 | Disposition: A | Discharge status: Home / Self Care | Payer: Medicare Other | Attending: Emergency Medicine | Admitting: Emergency Medicine

## 2022-05-11 ENCOUNTER — Emergency Department (HOSPITAL_COMMUNITY): Payer: Medicare Other

## 2022-05-11 DIAGNOSIS — Z79899 Other long term (current) drug therapy: Secondary | ICD-10-CM | POA: Insufficient documentation

## 2022-05-11 DIAGNOSIS — I2510 Atherosclerotic heart disease of native coronary artery without angina pectoris: Secondary | ICD-10-CM | POA: Diagnosis not present

## 2022-05-11 DIAGNOSIS — Z7982 Long term (current) use of aspirin: Secondary | ICD-10-CM | POA: Diagnosis not present

## 2022-05-11 DIAGNOSIS — I5030 Unspecified diastolic (congestive) heart failure: Secondary | ICD-10-CM | POA: Diagnosis not present

## 2022-05-11 DIAGNOSIS — I130 Hypertensive heart and chronic kidney disease with heart failure and stage 1 through stage 4 chronic kidney disease, or unspecified chronic kidney disease: Secondary | ICD-10-CM | POA: Insufficient documentation

## 2022-05-11 DIAGNOSIS — N183 Chronic kidney disease, stage 3 unspecified: Secondary | ICD-10-CM | POA: Diagnosis not present

## 2022-05-11 DIAGNOSIS — L03115 Cellulitis of right lower limb: Secondary | ICD-10-CM

## 2022-05-11 DIAGNOSIS — Z85828 Personal history of other malignant neoplasm of skin: Secondary | ICD-10-CM | POA: Diagnosis not present

## 2022-05-11 DIAGNOSIS — R030 Elevated blood-pressure reading, without diagnosis of hypertension: Secondary | ICD-10-CM

## 2022-05-11 LAB — BASIC METABOLIC PANEL
Anion gap: 9 (ref 5–15)
BUN: 21 mg/dL (ref 8–23)
CO2: 26 mmol/L (ref 22–32)
Calcium: 9.2 mg/dL (ref 8.9–10.3)
Chloride: 102 mmol/L (ref 98–111)
Creatinine, Ser: 0.71 mg/dL (ref 0.44–1.00)
GFR, Estimated: >60 mL/min (ref >60)
Glucose, Bld: 97 mg/dL (ref 70–99)
Potassium: 3.5 mmol/L (ref 3.5–5.1)
Sodium: 137 mmol/L (ref 135–145)

## 2022-05-11 LAB — CBC WITH DIFFERENTIAL/PLATELET
Abs Immature Granulocytes: 0.01 10*3/uL (ref 0.00–0.07)
Basophils Absolute: 0 10*3/uL (ref 0.0–0.1)
Basophils Relative: 0 %
Eosinophils Absolute: 0.2 10*3/uL (ref 0.0–0.5)
Eosinophils Relative: 3 %
HCT: 38.7 % (ref 36.0–46.0)
Hemoglobin: 12.3 g/dL (ref 12.0–15.0)
Immature Granulocytes: 0 %
Lymphocytes Relative: 18 %
Lymphs Abs: 1.1 10*3/uL (ref 0.7–4.0)
MCH: 30.7 pg (ref 26.0–34.0)
MCHC: 31.8 g/dL (ref 30.0–36.0)
MCV: 96.5 fL (ref 80.0–100.0)
Monocytes Absolute: 0.5 10*3/uL (ref 0.1–1.0)
Monocytes Relative: 8 %
Neutro Abs: 4.3 10*3/uL (ref 1.7–7.7)
Neutrophils Relative %: 71 %
Platelets: 147 10*3/uL — ABNORMAL LOW (ref 150–400)
RBC: 4.01 MIL/uL (ref 3.87–5.11)
RDW: 13.6 % (ref 11.5–15.5)
WBC: 6.1 10*3/uL (ref 4.0–10.5)
nRBC: 0 % (ref 0.0–0.2)

## 2022-05-11 LAB — SEDIMENTATION RATE: Sed Rate: 37 mm/hr — ABNORMAL HIGH (ref 0–22)

## 2022-05-11 LAB — LACTIC ACID, PLASMA
Lactic Acid, Venous: 0.8 mmol/L (ref 0.5–1.9)
Lactic Acid, Venous: 0.9 mmol/L (ref 0.5–1.9)

## 2022-05-11 MED ORDER — GADOBUTROL 1 MMOL/ML IV SOLN
5.0000 mL | Freq: Once | INTRAVENOUS | Status: AC | PRN
  Administered 2022-05-11: 5 mL via INTRAVENOUS

## 2022-05-11 MED ORDER — LIP MEDEX EX OINT
TOPICAL_OINTMENT | Freq: Once | CUTANEOUS | Status: AC
Start: 2022-05-11 — End: 2022-05-11
  Administered 2022-05-11: 75 via TOPICAL
  Filled 2022-05-11: qty 7

## 2022-05-11 MED ORDER — HYDRALAZINE HCL 20 MG/ML IJ SOLN
5.0000 mg | Freq: Once | INTRAMUSCULAR | Status: AC
  Administered 2022-05-11: 5 mg via INTRAVENOUS
  Filled 2022-05-11: qty 1

## 2022-05-11 NOTE — ED Provider Triage Note (Signed)
Emergency Medicine Provider Triage Evaluation Note  NHUNG DANKO , a 89 y.o. female  was evaluated in triage.  Pt complains of from podiatrist for IV antibiotics.  Review of Systems  Positive: Worsening foot wound despite p.o. antibiotics Negative: Fevers  Physical Exam  BP (!) 206/82   Pulse 79   Temp 98.6 F (37 C) (Oral)   Resp 18   LMP  (LMP Unknown)   SpO2 98%  Gen:   Awake, no distress   Resp:  Normal effort  MSK:   Moves extremities without difficulty  Other:    Medical Decision Making  Medically screening exam initiated at 4:20 PM.  Appropriate orders placed.  Rayneisha A Ricketts was informed that the remainder of the evaluation will be completed by another provider, this initial triage assessment does not replace that evaluation, and the importance of remaining in the ED until their evaluation is complete.     Gwenevere Abbot, [place] 05/11/22 1621
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
## 2022-05-11 NOTE — ED Triage Notes (Signed)
Pt referred to ED for wound infection to R foot and shin. Pt has failed outpatient abx therapy of cephalexin and then bactrim. Pt's wound reportedly grew MRSA. No fevers per pt. Recommendation for MRI and IV abx therapy admission.

## 2022-05-11 NOTE — ED Notes (Signed)
Had to change pt linen washed with soap and water applied barrier cream

## 2022-05-11 NOTE — ED Notes (Signed)
Pt had a loose bowel movement cleaned pt wit soap and water applied barrier cream to bottom

## 2022-05-12 ENCOUNTER — Other Ambulatory Visit (HOSPITAL_COMMUNITY): Payer: Self-pay

## 2022-05-12 MED ORDER — DEXTROSE 5 % IV SOLN
1500.0000 mg | Freq: Once | INTRAVENOUS | Status: AC
  Administered 2022-05-12: 1500 mg via INTRAVENOUS
  Filled 2022-05-12: qty 75

## 2022-05-12 MED ORDER — LEVETIRACETAM 500 MG PO TABS
500.0000 mg | ORAL_TABLET | Freq: Once | ORAL | Status: AC
  Administered 2022-05-12: 500 mg via ORAL
  Filled 2022-05-12: qty 1

## 2022-05-12 NOTE — Progress Notes (Signed)
Pharmacy Note:  Dalbavancin for Acute Bacterial Skin and Skin Structure Infection (ABSSSI) Patients to [facility] Discharge [name] is an 89 y.o. female who presented to [facility] on 05/11/2022 with an Acute Bacterial Skin and Skin Structure Infection  Inclusion criteria - Indication '[]'$  Moderately large skin lesion (>=75 cm2 or larger - about the size of a baseball) '[x]'$  Cellulitis    Patient was evaluated for the following exclusion criteria and no exclusions were found  Hardware involvement, Hypotension / shock, Elevated lactate (>2) without other explanation, gram-negative infection risk factors (bites, water exposure, infection after trauma, infection after skin graft, neutropenia, burns, severe immunocompromise), necrotizing fasciitis possible or confirmed, Known or suspected osteomyelitis or septic arthritis, endocarditis, diabetic foot infection, ischemic ulcers, post-operative wound infection, perirectal infections, need for drainage in the operating room, hand or facial infections, injection drug users with a fever, bacteremia, pregnancy or breastfeeding, allergy to related antibiotics like vancomycin, known liver disease (t.bili >2x ULN or AST/ALT 3x ULN)  [name], [name], PharmD 05/12/2022, 12:05 AM Clinical Pharmacist

## 2022-05-12 NOTE — Discharge Instructions (Addendum)
It was a pleasure caring for you today in the emergency department.  Please return to the emergency department for any worsening or worrisome symptoms.  Please follow-up with your PCP in regards to elevated blood pressure reading

## 2022-05-16 LAB — CULTURE, BLOOD (ROUTINE X 2)
Culture: NO GROWTH
Culture: NO GROWTH
Special Requests: ADEQUATE
Special Requests: ADEQUATE

## 2022-07-10 ENCOUNTER — Encounter: Payer: Self-pay | Admitting: Physical Therapy

## 2022-07-10 ENCOUNTER — Ambulatory Visit: Payer: Medicare Other | Attending: Physician Assistant | Admitting: Physical Therapy

## 2022-07-10 DIAGNOSIS — R2681 Unsteadiness on feet: Secondary | ICD-10-CM | POA: Insufficient documentation

## 2022-07-10 DIAGNOSIS — R262 Difficulty in walking, not elsewhere classified: Secondary | ICD-10-CM | POA: Diagnosis present

## 2022-07-10 DIAGNOSIS — M6281 Muscle weakness (generalized): Secondary | ICD-10-CM | POA: Diagnosis present
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
## 2022-07-27 ENCOUNTER — Ambulatory Visit: Payer: Medicare Other | Attending: Physician Assistant | Admitting: Physical Therapy

## 2022-07-27 DIAGNOSIS — M25551 Pain in right hip: Secondary | ICD-10-CM | POA: Diagnosis present

## 2022-07-27 DIAGNOSIS — R262 Difficulty in walking, not elsewhere classified: Secondary | ICD-10-CM | POA: Diagnosis not present

## 2022-07-27 DIAGNOSIS — M6281 Muscle weakness (generalized): Secondary | ICD-10-CM | POA: Diagnosis present

## 2022-07-27 DIAGNOSIS — R2681 Unsteadiness on feet: Secondary | ICD-10-CM | POA: Diagnosis present
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
## 2022-07-31 ENCOUNTER — Encounter: Payer: Self-pay | Admitting: Physical Therapy

## 2022-07-31 ENCOUNTER — Ambulatory Visit: Payer: Medicare Other | Admitting: Physical Therapy

## 2022-07-31 DIAGNOSIS — R2681 Unsteadiness on feet: Secondary | ICD-10-CM

## 2022-07-31 DIAGNOSIS — R262 Difficulty in walking, not elsewhere classified: Secondary | ICD-10-CM

## 2022-07-31 DIAGNOSIS — M6281 Muscle weakness (generalized): Secondary | ICD-10-CM

## 2022-07-31 DIAGNOSIS — M25551 Pain in right hip: Secondary | ICD-10-CM
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
## 2022-08-01 ENCOUNTER — Encounter: Payer: Self-pay | Admitting: Physical Therapy

## 2022-08-01 ENCOUNTER — Ambulatory Visit: Payer: Medicare Other | Admitting: Physical Therapy

## 2022-08-01 DIAGNOSIS — R262 Difficulty in walking, not elsewhere classified: Secondary | ICD-10-CM | POA: Diagnosis not present

## 2022-08-01 DIAGNOSIS — R2681 Unsteadiness on feet: Secondary | ICD-10-CM

## 2022-08-01 DIAGNOSIS — M6281 Muscle weakness (generalized): Secondary | ICD-10-CM

## 2022-08-01 DIAGNOSIS — M25551 Pain in right hip: Secondary | ICD-10-CM
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
## 2022-08-07 ENCOUNTER — Ambulatory Visit: Payer: Medicare Other | Admitting: Physical Therapy

## 2022-08-07 ENCOUNTER — Encounter: Payer: Self-pay | Admitting: Physical Therapy

## 2022-08-07 DIAGNOSIS — M6281 Muscle weakness (generalized): Secondary | ICD-10-CM

## 2022-08-07 DIAGNOSIS — M25551 Pain in right hip: Secondary | ICD-10-CM

## 2022-08-07 DIAGNOSIS — R262 Difficulty in walking, not elsewhere classified: Secondary | ICD-10-CM | POA: Diagnosis not present

## 2022-08-07 DIAGNOSIS — R2681 Unsteadiness on feet: Secondary | ICD-10-CM
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
## 2022-08-10 ENCOUNTER — Ambulatory Visit: Payer: Medicare Other | Admitting: Physical Therapy

## 2022-08-10 DIAGNOSIS — M6281 Muscle weakness (generalized): Secondary | ICD-10-CM

## 2022-08-10 DIAGNOSIS — R262 Difficulty in walking, not elsewhere classified: Secondary | ICD-10-CM | POA: Diagnosis not present

## 2022-08-10 DIAGNOSIS — R2681 Unsteadiness on feet: Secondary | ICD-10-CM
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
## 2022-08-14 ENCOUNTER — Encounter: Payer: Self-pay | Admitting: Physical Therapy

## 2022-08-14 ENCOUNTER — Ambulatory Visit: Payer: Medicare Other | Admitting: Physical Therapy

## 2022-08-14 DIAGNOSIS — R262 Difficulty in walking, not elsewhere classified: Secondary | ICD-10-CM

## 2022-08-14 DIAGNOSIS — R2681 Unsteadiness on feet: Secondary | ICD-10-CM

## 2022-08-14 DIAGNOSIS — M6281 Muscle weakness (generalized): Secondary | ICD-10-CM

## 2022-08-14 DIAGNOSIS — M25551 Pain in right hip: Secondary | ICD-10-CM
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
## 2022-08-17 ENCOUNTER — Ambulatory Visit: Payer: Medicare Other | Admitting: Physical Therapy

## 2022-08-17 ENCOUNTER — Encounter: Payer: Self-pay | Admitting: Physical Therapy

## 2022-08-17 DIAGNOSIS — M25551 Pain in right hip: Secondary | ICD-10-CM

## 2022-08-17 DIAGNOSIS — R2681 Unsteadiness on feet: Secondary | ICD-10-CM

## 2022-08-17 DIAGNOSIS — M6281 Muscle weakness (generalized): Secondary | ICD-10-CM

## 2022-08-17 DIAGNOSIS — R262 Difficulty in walking, not elsewhere classified: Secondary | ICD-10-CM | POA: Diagnosis not present
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
## 2022-11-29 ENCOUNTER — Encounter (HOSPITAL_BASED_OUTPATIENT_CLINIC_OR_DEPARTMENT_OTHER): Payer: Medicare Other | Attending: Physician Assistant | Admitting: Physician Assistant

## 2022-11-29 DIAGNOSIS — I509 Heart failure, unspecified: Secondary | ICD-10-CM | POA: Insufficient documentation

## 2022-11-29 DIAGNOSIS — G4730 Sleep apnea, unspecified: Secondary | ICD-10-CM | POA: Diagnosis not present

## 2022-11-29 DIAGNOSIS — I87332 Chronic venous hypertension (idiopathic) with ulcer and inflammation of left lower extremity: Secondary | ICD-10-CM | POA: Insufficient documentation

## 2022-11-29 DIAGNOSIS — M1990 Unspecified osteoarthritis, unspecified site: Secondary | ICD-10-CM | POA: Insufficient documentation

## 2022-11-29 DIAGNOSIS — I872 Venous insufficiency (chronic) (peripheral): Secondary | ICD-10-CM | POA: Diagnosis not present

## 2022-11-29 DIAGNOSIS — L97822 Non-pressure chronic ulcer of other part of left lower leg with fat layer exposed: Secondary | ICD-10-CM | POA: Insufficient documentation

## 2022-11-29 DIAGNOSIS — I2510 Atherosclerotic heart disease of native coronary artery without angina pectoris: Secondary | ICD-10-CM | POA: Insufficient documentation

## 2022-11-29 DIAGNOSIS — N183 Chronic kidney disease, stage 3 unspecified: Secondary | ICD-10-CM | POA: Diagnosis not present

## 2022-11-29 DIAGNOSIS — Z833 Family history of diabetes mellitus: Secondary | ICD-10-CM | POA: Diagnosis not present

## 2022-11-29 DIAGNOSIS — I130 Hypertensive heart and chronic kidney disease with heart failure and stage 1 through stage 4 chronic kidney disease, or unspecified chronic kidney disease: Secondary | ICD-10-CM | POA: Diagnosis not present

## 2022-12-06 ENCOUNTER — Encounter (HOSPITAL_BASED_OUTPATIENT_CLINIC_OR_DEPARTMENT_OTHER): Payer: Medicare Other | Admitting: Physician Assistant

## 2022-12-06 DIAGNOSIS — I87332 Chronic venous hypertension (idiopathic) with ulcer and inflammation of left lower extremity: Secondary | ICD-10-CM | POA: Diagnosis not present

## 2022-12-06 NOTE — Progress Notes (Signed)
[name] ([id number]) [id number] Page 1 of 7 Visit Report for 12/06/2022 Chief Complaint Document Details Patient Name: Date of Service: [name] [facility] [name]. 12/06/2022 2:15 PM Medical Record Number: [id number] Patient Account Number: [id number] Date of Birth/Sex: Treating RN: 16-Dec-1929 (89 y.o. F) Primary Care Provider: Tracey Harries Other Clinician: Referring Provider: Treating Provider/Extender: Vaughan Sine in Treatment: 1 Information Obtained from: Patient Chief Complaint Left LE Ulcer Electronic Signature(s) Signed: 12/06/2022 2:33:04 PM By: Allen Derry PA-C Entered By: Allen Derry on 12/06/2022 14:33:04 -------------------------------------------------------------------------------- Debridement Details Patient Name: Date of Service: [name], Gwendolyn [name]. 12/06/2022 2:15 PM Medical Record Number: [id number] Patient Account Number: [id number] Date of Birth/Sex: Treating RN: Jan 06, 1930 (89 y.o. F) Primary Care Provider: Tracey Harries Other Clinician: Referring Provider: Treating Provider/Extender: Vaughan Sine in Treatment: 1 Debridement Performed for Assessment: Wound #1 Left,Anterior Lower Leg Performed By: Physician Lenda Kelp, PA Debridement Type: Debridement Level of Consciousness (Pre-procedure): Awake and Alert Pre-procedure Verification/Time Out Yes - 14:50 Taken: Start Time: 14:51 Pain Control: Lidocaine 4% T opical Solution Percent of Wound Bed Debrided: 20% T Area Debrided (cm): otal 1.75 Tissue and other material debrided: Viable, Non-Viable, Slough, Subcutaneous, Skin: Dermis , Skin: Epidermis, Slough Level: Skin/Subcutaneous Tissue Debridement Description: Excisional Instrument: Curette Bleeding: Minimum End Time: 14:57 Procedural Pain: 0 Post Procedural Pain: 0 Response to Treatment: Procedure was tolerated well Level of Consciousness (Post- Awake and  Alert procedure): Post Debridement Measurements of Total Wound Length: (cm) 6.2 Width: (cm) 1.8 Depth: (cm) 0.1 Volume: (cm) 0.877 Character of Wound/Ulcer Post Debridement: Improved Post Procedure Diagnosis [name], [name] ([id number]) [id number] Page 2 of 7 Same as Pre-procedure Electronic Signature(s) Signed: 12/06/2022 3:09:00 PM By: Allen Derry PA-C Entered By: Allen Derry on 12/06/2022 15:08:59 -------------------------------------------------------------------------------- HPI Details Patient Name: Date of Service: [name], Gwendolyn [name]. 12/06/2022 2:15 PM Medical Record Number: [id number] Patient Account Number: [id number] Date of Birth/Sex: Treating RN: [birth year] (89 y.o. F) Primary Care Provider: Tracey Harries Other Clinician: Referring Provider: Treating Provider/Extender: Vaughan Sine in Treatment: 1 History of Present Illness ssociated Signs and Symptoms: 11-29-2022 patient's ABI was 1.06 in the clinic today and appears to be sufficient in order to heal her wound. No further [name] vascular evaluation is necessitated at this point. HPI Description: 11-29-2022 upon evaluation today patient presents for evaluation here in the clinic secondary to issues that she has been having with [name] over the left anterior shin. This actually is [name] that occurred roughly 3 months ago when she struck this leg on [name] table. Subsequently initially there was [name] significant skin tear her caretakers that were with her today did show me [name] picture of what the wound looks like initially and compared to that this is [name] far cry and much improved compared to what was noted previous. With that being said I do see [name] significant mount of slough and biofilm buildup I do believe she is going require some sharp debridement to clearway some of the necrotic debris. Right now they been leaving this open to air as [name] recommendation from [name] previous  provider. Patient does have [name] history of chronic venous insufficiency, coronary artery disease, chronic kidney disease stage III, and hypertension. 12-06-2022 upon evaluation today patient appears to be doing well currently in regard to her wound. She has been tolerating the dressing changes without complication. Fortunately there does not appear to be any signs of active  infection locally or systemically which is great news and in general I do believe that we are making headway towards complete closure which is good news. I do think however she may need [name] compression wrap to get this moving even better into the correct direction. Electronic Signature(s) Signed: 12/06/2022 3:07:13 PM By: Allen Derry PA-C Entered By: Allen Derry on 12/06/2022 15:07:13 -------------------------------------------------------------------------------- Physical Exam Details Patient Name: Date of Service: [name] [name]. 12/06/2022 2:15 PM Medical Record Number: [id number] Patient Account Number: [id number] Date of Birth/Sex: Treating RN: Jan 31, 1930 (89 y.o. F) Primary Care Provider: Tracey Harries Other Clinician: Referring Provider: Treating Provider/Extender: Vaughan Sine in Treatment: 1 Constitutional Well-nourished and well-hydrated in no acute distress. Respiratory normal breathing without difficulty. Psychiatric this patient is able to make decisions and demonstrates good insight into disease process. Alert and Oriented x 3. pleasant and cooperative. Notes Upon inspection patient's wound bed actually showed signs of good granulation epithelization at this point. Fortunately I do not see any signs of worsening overall I do believe the patient is making excellent headway towards closure. [name], [name] ([id number]) [id number] Page 3 of 7 Electronic Signature(s) Signed: 12/06/2022 3:07:42 PM By: Allen Derry PA-C Entered By: Allen Derry on 12/06/2022  15:07:42 -------------------------------------------------------------------------------- Physician Orders Details Patient Name: Date of Service: [name], Gwendolyn [name]. 12/06/2022 2:15 PM Medical Record Number: [id number] Patient Account Number: [id number] Date of Birth/Sex: Treating RN: November 05, 1929 (89 y.o. [name], Millard.Loa Primary Care Provider: Tracey Harries Other Clinician: Referring Provider: Treating Provider/Extender: Vaughan Sine in Treatment: 1 Verbal / Phone Orders: No Diagnosis Coding ICD-10 Coding Code Description [phone] Chronic venous hypertension (idiopathic) with ulcer and inflammation of left lower extremity L97.822 Non-pressure chronic ulcer of other part of left lower leg with fat layer exposed I25.10 Atherosclerotic heart disease of native coronary artery without angina pectoris N18.30 Chronic kidney disease, stage 3 unspecified I10 Essential (primary) hypertension Follow-up Appointments ppointment in 2 weeks. Leonard Schwartz Wednesday 12/20/2022 (Dr. Leanord Hawking covering) Return [name] Return appointment in 3 weeks. Leonard Schwartz Wednesday 12/27/2022 (please schedule [name] time for patient) Return appointment in 1 month. Leonard Schwartz Wednesday 01/03/2023 (please schedule [name] time for patient) Nurse Visit: - 345pm 12/13/2022 ****** nurse visit***** room 7 Anesthetic (In clinic) Topical Lidocaine 4% applied to wound bed Bathing/ Shower/ Hygiene May shower with protection but do not get wound dressing(s) wet. Protect dressing(s) with water repellant cover (for example, large plastic bag) or [name] cast cover and may then take shower. Edema Control - Lymphedema / SCD / Other Bilateral Lower Extremities Elevate legs to the level of the heart or above for 30 minutes daily and/or when sitting for 3-4 times [name] day throughout the day. Avoid standing for long periods of time. Exercise regularly Wound Treatment Wound #1 - Lower Leg Wound Laterality: Left, Anterior Cleanser: Soap and Water 1 x Per  Week/30 Days Discharge Instructions: May shower and wash wound with dial antibacterial soap and water prior to dressing change. Cleanser: Vashe 5.8 (oz) 1 x Per Week/30 Days Discharge Instructions: Cleanse the wound with Vashe prior to applying [name] clean dressing using gauze sponges, not tissue or cotton balls. Peri-Wound Care: Triamcinolone 15 (g) 1 x Per Week/30 Days Discharge Instructions: Use triamcinolone 15 (g) as directed Peri-Wound Care: Sween Lotion (Moisturizing lotion) 1 x Per Week/30 Days Discharge Instructions: Apply moisturizing lotion as directed Prim Dressing: Fibracol Plus Dressing, 4x4.38 in (collagen) 1 x Per Week/30 Days ary Discharge Instructions:  Moisten collagen with saline or hydrogel Prim Dressing: Xeroform Occlusive Gauze Dressing, 4x4 in 1 x Per Week/30 Days ary Discharge Instructions: Apply over the collagen. Secondary Dressing: ABD Pad, 5x9 (Generic) 1 x Per Week/30 Days Discharge Instructions: Apply over primary dressing as directed. Compression Wrap: Urgo K2 Lite, (equivalent to [name] 3 layer) two layer compression system, regular 1 x Per Week/30 Days Pecore, Nyelah [name] ([id number]) [id number] Page 4 of 7 Discharge Instructions: Apply Urgo K2 Lite as directed (alternative to 3 layer compression). Electronic Signature(s) Signed: 12/06/2022 5:04:24 PM By: Allen Derry PA-C Signed: 12/06/2022 6:05:41 PM By: Shawn Stall RN, BSN Entered By: Shawn Stall on 12/06/2022 15:03:51 -------------------------------------------------------------------------------- Problem List Details Patient Name: Date of Service: [name], Gwendolyn [name]. 12/06/2022 2:15 PM Medical Record Number: [id number] Patient Account Number: [id number] Date of Birth/Sex: Treating RN: [birth year] (89 y.o. F) Primary Care Provider: Tracey Harries Other Clinician: Referring Provider: Treating Provider/Extender: Vaughan Sine in Treatment: 1 Active  Problems ICD-10 Encounter Code Description Active Date MDM Diagnosis I87.332 Chronic venous hypertension (idiopathic) with ulcer and inflammation of left 11/29/2022 No Yes lower extremity L97.822 Non-pressure chronic ulcer of other part of left lower leg with fat layer exposed8/10/2022 No Yes I25.10 Atherosclerotic heart disease of native coronary artery without angina pectoris 11/29/2022 No Yes N18.30 Chronic kidney disease, stage 3 unspecified 11/29/2022 No Yes I10 Essential (primary) hypertension 11/29/2022 No Yes Inactive Problems Resolved Problems Electronic Signature(s) Signed: 12/06/2022 2:32:58 PM By: Allen Derry PA-C Entered By: Allen Derry on 12/06/2022 14:32:58 -------------------------------------------------------------------------------- Progress Note Details Patient Name: Date of Service: Gwendolyn Loosen [name]. 12/06/2022 2:15 PM Medical Record Number: [id number] Patient Account Number: [id number] Date of Birth/Sex: Treating RN: 1929/08/26 (89 y.o. Malayla Benedum, Eleora [name] ([id number]) [id number] Page 5 of 7 Primary Care Provider: Tracey Harries Other Clinician: Referring Provider: Treating Provider/Extender: Vaughan Sine in Treatment: 1 Subjective Chief Complaint Information obtained from Patient Left LE Ulcer History of Present Illness (HPI) The following HPI elements were documented for the patient's wound: Associated Signs and Symptoms: 11-29-2022 patient's ABI was 1.06 in the clinic today and appears to be sufficient in order to heal her wound. No further vascular evaluation is necessitated at this point. 11-29-2022 upon evaluation today patient presents for evaluation here in the clinic secondary to issues that she has been having with [name] over the left anterior shin. This actually is [name] that occurred roughly 3 months ago when she struck this leg on [name] table. Subsequently initially there was [name] significant skin tear her  caretakers that were with her today did show me [name] picture of what the wound looks like initially and compared to that this is [name] far cry and much improved compared to what was noted previous. With that being said I do see [name] significant mount of slough and biofilm buildup I do believe she is going require some sharp debridement to clearway some of the necrotic debris. Right now they been leaving this open to air as [name] recommendation from [name] previous provider. Patient does have [name] history of chronic venous insufficiency, coronary artery disease, chronic kidney disease stage III, and hypertension. 12-06-2022 upon evaluation today patient appears to be doing well currently in regard to her wound. She has been tolerating the dressing changes without complication. Fortunately there does not appear to be any signs of active infection locally or systemically which is great news and in general I do believe that we are making  headway towards complete closure which is good news. I do think however she may need [name] compression wrap to get this moving even better into the correct direction. Objective Constitutional Well-nourished and well-hydrated in no acute distress. Vitals Time Taken: 2:24 PM, Height: 61 in, Weight: 112 lbs, BMI: 21.2, Temperature: 98.1 F, Pulse: 132 bpm, Respiratory Rate: 18 breaths/min, Blood Pressure: 183/96 mmHg. Respiratory normal breathing without difficulty. Psychiatric this patient is able to make decisions and demonstrates good insight into disease process. Alert and Oriented x 3. pleasant and cooperative. General Notes: Upon inspection patient's wound bed actually showed signs of good granulation epithelization at this point. Fortunately I do not see any signs of worsening overall I do believe the patient is making excellent headway towards closure. Integumentary (Hair, Skin) Wound #1 status is Open. Original cause of wound was Trauma. The date acquired was: 08/29/2022. The wound has been  in treatment 1 weeks. The wound is located on the Left,Anterior Lower Leg. The wound measures 6.2cm length x 1.8cm width x 0.1cm depth; 8.765cm^2 area and 0.877cm^3 volume. There is no tunneling or undermining noted. There is [name] medium amount of serosanguineous drainage noted. The wound margin is distinct with the outline attached to the wound base. There is small (1-33%) red granulation within the wound bed. There is [name] large (67-100%) amount of necrotic tissue within the wound bed including Eschar and Adherent Slough. The periwound skin appearance had no abnormalities noted for texture. The periwound skin appearance had no abnormalities noted for color. The periwound skin appearance did not exhibit: Dry/Scaly, Maceration. Periwound temperature was noted as No Abnormality. Assessment Active Problems ICD-10 Chronic venous hypertension (idiopathic) with ulcer and inflammation of left lower extremity Non-pressure chronic ulcer of other part of left lower leg with fat layer exposed Atherosclerotic heart disease of native coronary artery without angina pectoris Chronic kidney disease, stage 3 unspecified Essential (primary) hypertension Procedures Wound #1 Pre-procedure diagnosis of Wound #1 is [name] T be determined located on the Left,Anterior Lower Leg . There was [name] Excisional Skin/Subcutaneous Tissue o Giangregorio, Todd [name] ([id number]) [id number] Page 6 of 7 Debridement with [name] total area of 1.75 sq cm performed by Lenda Kelp, PA. With the following instrument(s): Curette to remove Viable and Non-Viable tissue/material. Material removed includes Subcutaneous Tissue, Slough, Skin: Dermis, and Skin: Epidermis after achieving pain control using Lidocaine 4% T opical Solution. [name] time out was conducted at 14:50, prior to the start of the procedure. [name] Minimum amount of bleeding was controlled with N/[name]. The procedure was tolerated well with [name] pain level of 0 throughout and [name]  pain level of 0 following the procedure. Post Debridement Measurements: 6.2cm length x 1.8cm width x 0.1cm depth; 0.877cm^3 volume. Character of Wound/Ulcer Post Debridement is improved. Post procedure Diagnosis Wound #1: Same as Pre-Procedure Pre-procedure diagnosis of Wound #1 is [name] T be determined located on the Left,Anterior Lower Leg . There was [name] Double Layer Compression Therapy o Procedure by Shawn Stall, RN. Post procedure Diagnosis Wound #1: Same as Pre-Procedure Plan Follow-up Appointments: Return Appointment in 2 weeks. Leonard Schwartz Wednesday 12/20/2022 (Dr. Leanord Hawking covering) Return appointment in 3 weeks. Leonard Schwartz Wednesday 12/27/2022 (please schedule [name] time for patient) Return appointment in 1 month. Leonard Schwartz Wednesday 01/03/2023 (please schedule [name] time for patient) Nurse Visit: - 345pm 12/13/2022 ****** nurse visit***** room 7 Anesthetic: (In clinic) Topical Lidocaine 4% applied to wound bed Bathing/ Shower/ Hygiene: May shower with protection but do not get wound dressing(s) wet. Protect dressing(s)  with water repellant cover (for example, large plastic bag) or [name] cast cover and may then take shower. Edema Control - Lymphedema / SCD / Other: Elevate legs to the level of the heart or above for 30 minutes daily and/or when sitting for 3-4 times [name] day throughout the day. Avoid standing for long periods of time. Exercise regularly WOUND #1: - Lower Leg Wound Laterality: Left, Anterior Cleanser: Soap and Water 1 x Per Week/30 Days Discharge Instructions: May shower and wash wound with dial antibacterial soap and water prior to dressing change. Cleanser: Vashe 5.8 (oz) 1 x Per Week/30 Days Discharge Instructions: Cleanse the wound with Vashe prior to applying [name] clean dressing using gauze sponges, not tissue or cotton balls. Peri-Wound Care: Triamcinolone 15 (g) 1 x Per Week/30 Days Discharge Instructions: Use triamcinolone 15 (g) as directed Peri-Wound Care: Sween Lotion (Moisturizing lotion)  1 x Per Week/30 Days Discharge Instructions: Apply moisturizing lotion as directed Prim Dressing: Fibracol Plus Dressing, 4x4.38 in (collagen) 1 x Per Week/30 Days ary Discharge Instructions: Moisten collagen with saline or hydrogel Prim Dressing: Xeroform Occlusive Gauze Dressing, 4x4 in 1 x Per Week/30 Days ary Discharge Instructions: Apply over the collagen. Secondary Dressing: ABD Pad, 5x9 (Generic) 1 x Per Week/30 Days Discharge Instructions: Apply over primary dressing as directed. Com pression Wrap: Urgo K2 Lite, (equivalent to [name] 3 layer) two layer compression system, regular 1 x Per Week/30 Days Discharge Instructions: Apply Urgo K2 Lite as directed (alternative to 3 layer compression). 1. I am good recommend that we have the patient continue to monitor for any evidence of infection or worsening. Based on what I see I do think that [name] light compression wrap would be helpful. I would recommend the Urgo K2 lite compression wrap. 2. I am so going to recommend that we have the patient continue to elevate her legs much as possible. 3. I am also going to suggest the patient should continue to utilize dressings to the site I would suggest collagen and then will use the Zetuvit over top in case there is any drainage although this does not seem to be draining nearly as much as it was previous this is excellent news. We will see patient back for reevaluation in 1 week here in the clinic. If anything worsens or changes patient will contact our office for additional recommendations. Electronic Signature(s) Signed: 12/06/2022 3:11:10 PM By: Allen Derry PA-C Previous Signature: 12/06/2022 3:08:11 PM Version By: Allen Derry PA-C Entered By: Allen Derry on 12/06/2022 15:11:10 -------------------------------------------------------------------------------- SuperBill Details Patient Name: Date of Service: [name], Gwendolyn [name]. 12/06/2022 Medical Record Number: [id number] Patient Account Number:  [id number] Date of Birth/Sex: Treating RN: [birth year] (89 y.o. [name] Primary Care Provider: Tracey Harries Other Clinician: Referring Provider: Treating Provider/Extender: Vaughan Sine in Treatment: 1 Shoultz, Halford Decamp ([id number]) [id number] Page 7 of 7 Diagnosis Coding ICD-10 Codes Code Description [phone] Chronic venous hypertension (idiopathic) with ulcer and inflammation of left lower extremity L97.822 Non-pressure chronic ulcer of other part of left lower leg with fat layer exposed I25.10 Atherosclerotic heart disease of native coronary artery without angina pectoris N18.30 Chronic kidney disease, stage 3 unspecified I10 Essential (primary) hypertension Facility Procedures : CPT4 Code: [id number] Description: 11042 - DEB SUBQ TISSUE 20 SQ CM/< ICD-10 Diagnosis Description L97.822 Non-pressure chronic ulcer of other part of left lower leg with fat layer expo Modifier: sed Quantity: 1 Physician Procedures : CPT4 Code Description Modifier [id number] 11042 - WC PHYS SUBQ  TISS 20 SQ CM ICD-10 Diagnosis Description L97.822 Non-pressure chronic ulcer of other part of left lower leg with fat layer exposed Quantity: 1 Electronic Signature(s) Signed: 12/06/2022 3:11:46 PM By: Allen Derry PA-C Entered By: Allen Derry on 12/06/2022 15:11:45

## 2022-12-08 NOTE — Progress Notes (Signed)
LIBI, OTANEZ A ([id number]) [id number].pdf Page 1 of 6 Visit Report for 12/06/2022 Arrival Information Details Patient Name: Date of Service: [name] [facility] A. 12/06/2022 2:15 PM Medical Record Number: [id number] Patient Account Number: [id number] Date of Birth/Sex: Treating RN: [birth year] (89 y.o. F) Primary Care Oluwadarasimi Redmon: Tracey Harries Other Clinician: Referring Jaylena Holloway: Treating Lorel Lembo/Extender: Vaughan Sine in Treatment: 1 Visit Information History Since Last Visit Added or deleted any medications: No Patient Arrived: Dan Humphreys Any new allergies or adverse reactions: No Arrival Time: 14:22 Had a fall or experienced change in No Accompanied By: family activities of daily living that may affect Transfer Assistance: None risk of falls: Patient Identification Verified: Yes Signs or symptoms of abuse/neglect since last visito No Secondary Verification Process Completed: Yes Hospitalized since last visit: No Patient Requires Transmission-Based Precautions: No Implantable device outside of the clinic excluding No Patient Has Alerts: Yes cellular tissue based products placed in the center Patient Alerts: Patient on Blood Thinner since last visit: Has Dressing in Place as Prescribed: Yes Pain Present Now: No Electronic Signature(s) Signed: 12/08/2022 12:35:38 PM By: Thayer Dallas Entered By: Thayer Dallas on 12/06/2022 14:23:17 -------------------------------------------------------------------------------- Compression Therapy Details Patient Name: Date of Service: [name] A. 12/06/2022 2:15 PM Medical Record Number: [id number] Patient Account Number: [id number] Date of Birth/Sex: Treating RN: [birth year] (89 y.o. [name] Primary Care Latisha Lasch: Tracey Harries Other Clinician: Referring Lodie Waheed: Treating Reiss Mowrey/Extender: Vaughan Sine in Treatment: 1 Compression Therapy Performed for  Wound Assessment: Wound #1 Left,Anterior Lower Leg Performed By: Clinician Shawn Stall, RN Compression Type: Double Layer Post Procedure Diagnosis Same as Pre-procedure Electronic Signature(s) Signed: 12/06/2022 6:05:41 PM By: Shawn Stall RN, BSN Entered By: Shawn Stall on 12/06/2022 14:58:17 Speedy, Dayami A ([id number]) [id number] Page 2 of 6 -------------------------------------------------------------------------------- Encounter Discharge Information Details Patient Name: Date of Service: [name] [facility] A. 12/06/2022 2:15 PM Medical Record Number: [id number] Patient Account Number: [id number] Date of Birth/Sex: Treating RN: 1929/07/03 (89 y.o. [name] Primary Care [name]: Tracey Harries Other Clinician: Referring Yoona Ishii: Treating [name]/Extender: Vaughan Sine in Treatment: 1 Encounter Discharge Information Items Post Procedure Vitals Discharge Condition: Stable Temperature (F): 98.1 Ambulatory Status: Ambulatory Pulse (bpm): 91 Discharge Destination: Home Respiratory Rate (breaths/min): 20 Transportation: Private Auto Blood Pressure (mmHg): 183/96 Accompanied By: family Schedule Follow-up Appointment: Yes Clinical Summary of Care: Electronic Signature(s) Signed: 12/06/2022 6:05:41 PM By: Shawn Stall RN, BSN Entered By: Shawn Stall on 12/06/2022 15:07:59 -------------------------------------------------------------------------------- Lower Extremity Assessment Details Patient Name: Date of Service: [name] A. 12/06/2022 2:15 PM Medical Record Number: [id number] Patient Account Number: [id number] Date of Birth/Sex: Treating RN: 1929-07-26 (89 y.o. F) Primary Care Shaylie Eklund: Tracey Harries Other Clinician: Referring Domique Clapper: Treating Verdie Wilms/Extender: Vaughan Sine in Treatment: 1 Edema Assessment Assessed: [Left: No] [Right: No] [Left: Edema] [Right:  :] Calf Left: Right: Point of Measurement: 32 cm From Medial Instep 30.5 cm Ankle Left: Right: Point of Measurement: 9 cm From Medial Instep 16 cm Vascular Assessment Extremity colors, hair growth, and conditions: Extremity Color: [Left:Normal] Hair Growth on Extremity: [Left:No] Temperature of Extremity: [Left:Warm] Capillary Refill: [Left:< 3 seconds] Dependent Rubor: [Left:No No] Electronic Signature(s) Signed: 12/08/2022 12:35:38 PM By: Thayer Dallas Entered By: Thayer Dallas on 12/06/2022 14:26:18 Kneisel, Chantia A ([id number]) [id number] Page 3 of 6 -------------------------------------------------------------------------------- Multi-Disciplinary Care Plan Details Patient Name: Date of Service: PREV A TT, [name] 12/06/2022 2:15 PM Medical  Record Number: [id number] Patient Account Number: [id number] Date of Birth/Sex: Treating RN: March 31, 1930 (89 y.o. [name], Millard.Loa Primary Care Hiba Garry: Tracey Harries Other Clinician: Referring Jamylah Marinaccio: Treating [name]/Extender: Vaughan Sine in Treatment: 1 Active Inactive Wound/Skin Impairment Nursing Diagnoses: Knowledge deficit related to smoking impact on wound healing Goals: Patient/caregiver will verbalize understanding of skin care regimen Date Initiated: 11/29/2022 Target Resolution Date: 01/22/2023 Goal Status: Active Interventions: Assess patient/caregiver ability to obtain necessary supplies Assess patient/caregiver ability to perform ulcer/skin care regimen upon admission and as needed Assess ulceration(s) every visit Provide education on ulcer and skin care Screen for HBO Treatment Activities: Skin care regimen initiated : 11/29/2022 Topical wound management initiated : 11/29/2022 Notes: Electronic Signature(s) Signed: 12/06/2022 6:05:41 PM By: Shawn Stall RN, BSN Entered By: Shawn Stall on 12/06/2022  14:37:25 -------------------------------------------------------------------------------- Pain Assessment Details Patient Name: Date of Service: [name], [name] 12/06/2022 2:15 PM Medical Record Number: [id number] Patient Account Number: [id number] Date of Birth/Sex: Treating RN: [birth year] (89 y.o. F) Primary Care Angelo Caroll: Tracey Harries Other Clinician: Referring Arshi Duarte: Treating Nekayla Heider/Extender: Vaughan Sine in Treatment: 1 Active Problems Location of Pain Severity and Description of Pain Patient Has Paino No Site Locations EVENY, PYNES A ([id number]) [id number] Page 4 of 6 Pain Management and Medication Current Pain Management: Electronic Signature(s) Signed: 12/08/2022 12:35:38 PM By: Thayer Dallas Entered By: Thayer Dallas on 12/06/2022 14:24:43 -------------------------------------------------------------------------------- Patient/Caregiver Education Details Patient Name: Date of Service: [name] A. 8/14/2024andnbsp2:15 PM Medical Record Number: [id number] Patient Account Number: [id number] Date of Birth/Gender: Treating RN: [birth year] (89 y.o. [name] Primary Care Physician: Tracey Harries Other Clinician: Referring Physician: Treating Physician/Extender: Vaughan Sine in Treatment: 1 Education Assessment Education Provided To: Patient Education Topics Provided Wound/Skin Impairment: Handouts: Caring for Your Ulcer Methods: Explain/Verbal Responses: Reinforcements needed Electronic Signature(s) Signed: 12/06/2022 6:05:41 PM By: Shawn Stall RN, BSN Entered By: Shawn Stall on 12/06/2022 14:38:24 -------------------------------------------------------------------------------- Wound Assessment Details Patient Name: Date of Service: [name], [name] 12/06/2022 2:15 PM Medical Record Number: [id number] Patient Account Number: [id number] Date of Birth/Sex:  Treating RN: 1929/11/02 (89 y.o. F) Primary Care Mccade Sullenberger: Tracey Harries Other Clinician: Mariel Craft ([id number]) [id number] Page 5 of 6 Referring Calvina Liptak: Treating Jaiveer Panas/Extender: Vaughan Sine in Treatment: 1 Wound Status Wound Number: 1 Primary T be determined o Etiology: Wound Location: Left, Anterior Lower Leg Wound Open Wounding Event: Trauma Status: Date Acquired: 08/29/2022 Comorbid Cataracts, Anemia, Sleep Apnea, Congestive Heart Failure, Weeks Of Treatment: 1 History: Coronary Artery Disease, Hypertension, Myocardial Infarction, Clustered Wound: No Gout, Osteoarthritis, Neuropathy Photos Wound Measurements Length: (cm) 6.2 Width: (cm) 1.8 Depth: (cm) 0.1 Area: (cm) 8.765 Volume: (cm) 0.877 % Reduction in Area: 25.6% % Reduction in Volume: 25.6% Epithelialization: None Tunneling: No Undermining: No Wound Description Classification: Full Thickness Without Exposed Support Structures Wound Margin: Distinct, outline attached Exudate Amount: Medium Exudate Type: Serosanguineous Exudate Color: red, brown Foul Odor After Cleansing: No Slough/Fibrino No Wound Bed Granulation Amount: Small (1-33%) Granulation Quality: Red Necrotic Amount: Large (67-100%) Necrotic Quality: Eschar, Adherent Slough Periwound Skin Texture Texture Color No Abnormalities Noted: Yes No Abnormalities Noted: Yes Moisture Temperature / Pain No Abnormalities Noted: No Temperature: No Abnormality Dry / Scaly: No Maceration: No Treatment Notes Wound #1 (Lower Leg) Wound Laterality: Left, Anterior Cleanser Soap and Water Discharge Instruction: May shower and wash wound with dial antibacterial soap and water prior to dressing change. Vashe  5.8 (oz) Discharge Instruction: Cleanse the wound with Vashe prior to applying a clean dressing using gauze sponges, not tissue or cotton balls. Peri-Wound Care Triamcinolone 15 (g) Discharge  Instruction: Use triamcinolone 15 (g) as directed Sween Lotion (Moisturizing lotion) Discharge Instruction: Apply moisturizing lotion as directed Topical Primary Dressing Bodkins, Keayra A ([id number]) [id number] Page 6 of 6 Fibracol Plus Dressing, 4x4.38 in (collagen) Discharge Instruction: Moisten collagen with saline or hydrogel Xeroform Occlusive Gauze Dressing, 4x4 in Discharge Instruction: Apply over the collagen. Secondary Dressing ABD Pad, 5x9 Discharge Instruction: Apply over primary dressing as directed. Secured With Compression Wrap Urgo K2 Lite, (equivalent to a 3 layer) two layer compression system, regular Discharge Instruction: Apply Urgo K2 Lite as directed (alternative to 3 layer compression). Compression Stockings Add-Ons Electronic Signature(s) Signed: 12/08/2022 12:35:38 PM By: Thayer Dallas Entered By: Thayer Dallas on 12/06/2022 14:26:49 -------------------------------------------------------------------------------- Vitals Details Patient Name: Date of Service: [name], [name] 12/06/2022 2:15 PM Medical Record Number: [id number] Patient Account Number: [id number] Date of Birth/Sex: Treating RN: [birth year] (89 y.o. F) Primary Care Tanara Turvey: Tracey Harries Other Clinician: Referring Zeynep Fantroy: Treating Shavette Shoaff/Extender: Vaughan Sine in Treatment: 1 Vital Signs Time Taken: 14:24 Temperature (F): 98.1 Height (in): 61 Pulse (bpm): 132 Weight (lbs): 112 Respiratory Rate (breaths/min): 18 Body Mass Index (BMI): 21.2 Blood Pressure (mmHg): 183/96 Reference Range: 80 - 120 mg / dl Electronic Signature(s) Signed: 12/08/2022 12:35:38 PM By: Thayer Dallas Entered By: Thayer Dallas on 12/06/2022 14:24:35

## 2022-12-12 ENCOUNTER — Ambulatory Visit (HOSPITAL_BASED_OUTPATIENT_CLINIC_OR_DEPARTMENT_OTHER): Payer: Medicare Other | Admitting: Internal Medicine

## 2022-12-14 ENCOUNTER — Encounter (HOSPITAL_BASED_OUTPATIENT_CLINIC_OR_DEPARTMENT_OTHER): Payer: Medicare Other | Admitting: Internal Medicine

## 2022-12-14 DIAGNOSIS — I87332 Chronic venous hypertension (idiopathic) with ulcer and inflammation of left lower extremity: Secondary | ICD-10-CM | POA: Diagnosis not present

## 2022-12-15 NOTE — Progress Notes (Signed)
SHALYNN, MANALAC [name] ([id number]) [id number] Page 1 of 3 Visit Report for 12/14/2022 Arrival Information Details Patient Name: Date of Service: [name]. 12/14/2022 2:45 PM Medical Record Number: [id number] Patient Account Number: [id number] Date of Birth/Sex: Treating RN: August 04, 1929 (89 y.o. [name], [name] Primary Care Abayomi Pattison: Tracey Harries Other Clinician: Referring Layn Kye: Treating Nickalaus Crooke/Extender: Noreene Filbert in Treatment: 2 Visit Information History Since Last Visit Added or deleted any medications: No Patient Arrived: Dan Humphreys Any new allergies or adverse reactions: No Arrival Time: 16:00 Had [name] fall or experienced change in No Accompanied By: family members activities of daily living that may affect Transfer Assistance: None risk of falls: Patient Identification Verified: Yes Signs or symptoms of abuse/neglect since last visito No Secondary Verification Process Completed: Yes Hospitalized since last visit: No Patient Requires Transmission-Based Precautions: No Implantable device outside of the clinic excluding No Patient Has Alerts: Yes cellular tissue based products placed in the center Patient Alerts: Patient on Blood Thinner since last visit: Has Dressing in Place as Prescribed: Yes Has Compression in Place as Prescribed: Yes Pain Present Now: No Electronic Signature(s) Signed: 12/14/2022 5:48:32 PM By: Shawn Stall RN, BSN Entered By: Shawn Stall on 12/14/2022 13:47:38 -------------------------------------------------------------------------------- Compression Therapy Details Patient Name: Date of Service: [name]. 12/14/2022 2:45 PM Medical Record Number: [id number] Patient Account Number: [id number] Date of Birth/Sex: Treating RN: Apr 12, 1930 (89 y.o. [name] Primary Care Ariyah Sedlack: Tracey Harries Other Clinician: Referring Sherene Plancarte: Treating Socorro Ebron/Extender: Noreene Filbert in Treatment: 2 Compression Therapy Performed for Wound Assessment: Wound #1 Left,Anterior Lower Leg Performed By: Clinician Shawn Stall, RN Compression Type: Double Layer Electronic Signature(s) Signed: 12/14/2022 5:48:32 PM By: Shawn Stall RN, BSN Entered By: Shawn Stall on 12/14/2022 13:48:37 -------------------------------------------------------------------------------- Encounter Discharge Information Details Patient Name: Date of Service: [name]. 12/14/2022 2:45 PM Medical Record Number: [id number] Patient Account Number: [id number] [name], [name] ([id number]) [phone].pdf Page 2 of 3 Date of Birth/Sex: Treating RN: [birth year] (89 y.o. [name] Primary Care Racquel Arkin: Tracey Harries Other Clinician: Referring Jaki Hammerschmidt: Treating Micala Saltsman/Extender: Noreene Filbert in Treatment: 2 Encounter Discharge Information Items Discharge Condition: Stable Ambulatory Status: Walker Discharge Destination: Home Transportation: Private Auto Accompanied By: self Schedule Follow-up Appointment: Yes Clinical Summary of Care: Electronic Signature(s) Signed: 12/14/2022 5:48:32 PM By: Shawn Stall RN, BSN Entered By: Shawn Stall on 12/14/2022 13:50:17 -------------------------------------------------------------------------------- Wound Assessment Details Patient Name: Date of Service: [name]. 12/14/2022 2:45 PM Medical Record Number: [id number] Patient Account Number: [id number] Date of Birth/Sex: Treating RN: 11-17-1929 (89 y.o. [name], [name] Primary Care Evana Runnels: Tracey Harries Other Clinician: Referring Niklaus Mamaril: Treating Azarya Oconnell/Extender: Noreene Filbert in Treatment: 2 Wound Status Wound Number: 1 Primary Trauma, Other Etiology: Wound Location: Left, Anterior Lower Leg Wound Open Wounding Event: Trauma Status: Date Acquired:  08/29/2022 Comorbid Cataracts, Anemia, Sleep Apnea, Congestive Heart Failure, Weeks Of Treatment: 2 History: Coronary Artery Disease, Hypertension, Myocardial Infarction, Clustered Wound: No Gout, Osteoarthritis, Neuropathy Wound Measurements Length: (cm) 6.2 Width: (cm) 1.8 Depth: (cm) 0.1 Area: (cm) 8.765 Volume: (cm) 0.877 % Reduction in Area: 25.6% % Reduction in Volume: 25.6% Epithelialization: Medium (34-66%) Tunneling: No Undermining: No Wound Description Classification: Full Thickness Without Exposed Suppor Wound Margin: Distinct, outline attached Exudate Amount: Medium Exudate Type: Serosanguineous Exudate Color: red, brown t Structures Foul Odor After Cleansing: No Slough/Fibrino No Wound Bed Granulation Amount: Large (67-100%) Exposed Structure Granulation Quality:  Red Fascia Exposed: No Necrotic Amount: None Present (0%) Fat Layer (Subcutaneous Tissue) Exposed: Yes Tendon Exposed: No Muscle Exposed: No Joint Exposed: No Bone Exposed: No Periwound Skin Texture Texture Color No Abnormalities Noted: Yes No Abnormalities Noted: Yes Moisture Temperature / Pain No Abnormalities Noted: No Temperature: No Abnormality Dry / Scaly: No Boeve, [name] ([id number]) [id number] Page 3 of 3 Maceration: No Treatment Notes Wound #1 (Lower Leg) Wound Laterality: Left, Anterior Cleanser Soap and Water Discharge Instruction: May shower and wash wound with dial antibacterial soap and water prior to dressing change. Vashe 5.8 (oz) Discharge Instruction: Cleanse the wound with Vashe prior to applying [name] clean dressing using gauze sponges, not tissue or cotton balls. Peri-Wound Care Triamcinolone 15 (g) Discharge Instruction: Use triamcinolone 15 (g) as directed Sween Lotion (Moisturizing lotion) Discharge Instruction: Apply moisturizing lotion as directed Topical Primary Dressing Fibracol Plus Dressing, 4x4.38 in (collagen) Discharge  Instruction: Moisten collagen with saline or hydrogel Xeroform Occlusive Gauze Dressing, 4x4 in Discharge Instruction: Apply over the collagen. Secondary Dressing ABD Pad, 5x9 Discharge Instruction: Apply over primary dressing as directed. Secured With Compression Wrap Urgo K2 Lite, (equivalent to [name] 3 layer) two layer compression system, regular Discharge Instruction: Apply Urgo K2 Lite as directed (alternative to 3 layer compression). Compression Stockings Add-Ons Electronic Signature(s) Signed: 12/14/2022 5:48:32 PM By: Shawn Stall RN, BSN Entered By: Shawn Stall on 12/14/2022 13:48:15

## 2022-12-18 NOTE — Progress Notes (Signed)
TANGELLA, WALDROP A ([id number]) [id number] Page 1 of 1 Visit Report for 12/14/2022 SuperBill Details Patient Name: Date of Service: [name] 12/14/2022 Medical Record Number: [id number] Patient Account Number: [id number] Date of Birth/Sex: Treating RN: September 29, 1929 (89 y.o. [name], Millard.Loa Primary Care Provider: Tracey Harries Other Clinician: Referring Provider: Treating Provider/Extender: Noreene Filbert in Treatment: 2 Diagnosis Coding ICD-10 Codes Code Description [phone] Chronic venous hypertension (idiopathic) with ulcer and inflammation of left lower extremity L97.822 Non-pressure chronic ulcer of other part of left lower leg with fat layer exposed I25.10 Atherosclerotic heart disease of native coronary artery without angina pectoris N18.30 Chronic kidney disease, stage 3 unspecified I10 Essential (primary) hypertension Facility Procedures CPT4 Code Description Modifier Quantity [id number] (Facility Use Only) [phone] - APPLY MULTLAY COMPRS LWR LT LEG 1 Electronic Signature(s) Signed: 12/14/2022 5:48:32 PM By: Shawn Stall RN, BSN Signed: 12/18/2022 1:35:31 PM By: Geralyn Corwin DO Entered By: Shawn Stall on 12/14/2022 13:50:29

## 2022-12-19 NOTE — Progress Notes (Signed)
CYRENA, MORMANDO A ([id number]) [id number] Page 1 of 7 Visit Report for 11/29/2022 Allergy List Details Patient Name: Date of Service: [name] [facility] A. 11/29/2022 2:00 PM Medical Record Number: [id number] Patient Account Number: [id number] Date of Birth/Sex: Treating RN: 02-26-1930 (89 y.o. Tommye Standard Primary Care Kalesha Irving: Tracey Harries Other Clinician: Referring Atzel Mccambridge: Treating Dove Gresham/Extender: Vaughan Sine in Treatment: 0 Allergies Active Allergies codeine Reaction: hypertension latex adhesive tape Allergy Notes Electronic Signature(s) Signed: 12/01/2022 12:52:02 PM By: Thayer Dallas Entered By: Thayer Dallas on 11/29/2022 14:10:39 -------------------------------------------------------------------------------- Arrival Information Details Patient Name: Date of Service: [name], [name] 11/29/2022 2:00 PM Medical Record Number: [id number] Patient Account Number: [id number] Date of Birth/Sex: Treating RN: December 29, 1929 (89 y.o. F) Primary Care Janiyla Long: Tracey Harries Other Clinician: Referring Katielynn Horan: Treating Eathel Pajak/Extender: Vaughan Sine in Treatment: 0 Visit Information Patient Arrived: Cloyde Reams Time: 14:07 Accompanied By: son Transfer Assistance: None Patient Identification Verified: Yes Secondary Verification Process Completed: Yes Patient Requires Transmission-Based Precautions: No Patient Has Alerts: Yes Patient Alerts: Patient on Blood Thinner Electronic Signature(s) Signed: 12/01/2022 12:52:02 PM By: Thayer Dallas Entered By: Thayer Dallas on 11/29/2022 14:09:45 Guerrero, Calyse A ([id number]) [id number] Page 2 of 7 -------------------------------------------------------------------------------- Clinic Level of Care Assessment Details Patient Name: Date of Service: [name] ERLY A. 11/29/2022 2:00 PM Medical Record Number:  [id number] Patient Account Number: [id number] Date of Birth/Sex: Treating RN: July 03, 1929 (89 y.o. Gevena Mart Primary Care Link Burgeson: Tracey Harries Other Clinician: Referring Zadrian Mccauley: Treating Deliliah Spranger/Extender: Vaughan Sine in Treatment: 0 Clinic Level of Care Assessment Items TOOL 1 Quantity Score X- 1 0 Use when EandM and Procedure is performed on INITIAL visit ASSESSMENTS - Nursing Assessment / Reassessment X- 1 20 General Physical Exam (combine w/ comprehensive assessment (listed just below) when performed on new pt. evals) X- 1 25 Comprehensive Assessment (HX, ROS, Risk Assessments, Wounds Hx, etc.) ASSESSMENTS - Wound and Skin Assessment / Reassessment X- 1 10 Dermatologic / Skin Assessment (not related to wound area) ASSESSMENTS - Ostomy and/or Continence Assessment and Care []  - 0 Incontinence Assessment and Management []  - 0 Ostomy Care Assessment and Management (repouching, etc.) PROCESS - Coordination of Care X - Simple Patient / Family Education for ongoing care 1 15 []  - 0 Complex (extensive) Patient / Family Education for ongoing care X- 1 10 Staff obtains Chiropractor, Records, T Results / Process Orders est []  - 0 Staff telephones HHA, Nursing Homes / Clarify orders / etc []  - 0 Routine Transfer to another Facility (non-emergent condition) []  - 0 Routine Hospital Admission (non-emergent condition) X- 1 15 New Admissions / Manufacturing engineer / Ordering NPWT Apligraf, etc. , []  - 0 Emergency Hospital Admission (emergent condition) PROCESS - Special Needs []  - 0 Pediatric / Minor Patient Management []  - 0 Isolation Patient Management []  - 0 Hearing / Language / Visual special needs []  - 0 Assessment of Community assistance (transportation, D/C planning, etc.) []  - 0 Additional assistance / Altered mentation []  - 0 Support Surface(s) Assessment (bed, cushion, seat, etc.) INTERVENTIONS - Miscellaneous []  - 0 External  ear exam []  - 0 Patient Transfer (multiple staff / Nurse, adult / Similar devices) []  - 0 Simple Staple / Suture removal (25 or less) []  - 0 Complex Staple / Suture removal (26 or more) []  - 0 Hypo/Hyperglycemic Management (do not check if billed separately) X- 1 15 Ankle / Brachial Index (ABI) - do not check  if billed separately Has the patient been seen at the hospital within the last three years: Yes Total Score: 110 Level Of Care: New/Established - Level 3 Electronic Signature(s) Signed: 12/19/2022 2:03:55 PM By: Brenton Grills Fasnacht,Signed: 12/19/2022 2:03:55 PM By: Luna Fuse A ([id number]) [id number] Page 3 of 7 Entered By: Brenton Grills on 11/29/2022 15:05:16 -------------------------------------------------------------------------------- Encounter Discharge Information Details Patient Name: Date of Service: [name] [facility] A. 11/29/2022 2:00 PM Medical Record Number: [id number] Patient Account Number: [id number] Date of Birth/Sex: Treating RN: 1929-05-19 (89 y.o. Gevena Mart Primary Care Gladstone Rosas: Tracey Harries Other Clinician: Referring Didi Ganaway: Treating Catcher Dehoyos/Extender: Vaughan Sine in Treatment: 0 Encounter Discharge Information Items Post Procedure Vitals Discharge Condition: Stable Temperature (F): 98.2 Ambulatory Status: Ambulatory Pulse (bpm): 82 Discharge Destination: Home Respiratory Rate (breaths/min): 20 Transportation: Private Auto Blood Pressure (mmHg): 171/80 Accompanied By: son Schedule Follow-up Appointment: Yes Clinical Summary of Care: Patient Declined Electronic Signature(s) Signed: 12/19/2022 2:03:55 PM By: Brenton Grills Entered By: Brenton Grills on 11/29/2022 15:27:43 -------------------------------------------------------------------------------- Lower Extremity Assessment Details Patient Name: Date of Service: [name] A. 11/29/2022 2:00 PM Medical Record  Number: [id number] Patient Account Number: [id number] Date of Birth/Sex: Treating RN: January 09, 1930 (89 y.o. F) Primary Care Oakes Mccready: Tracey Harries Other Clinician: Referring Hadlee Burback: Treating Bradely Rudin/Extender: Vaughan Sine in Treatment: 0 Edema Assessment Assessed: [Left: No] [Right: No] [Left: Edema] [Right: :] Calf Left: Right: Point of Measurement: 32 cm From Medial Instep 32.2 cm Ankle Left: Right: Point of Measurement: 9 cm From Medial Instep 16.5 cm Knee To Floor Left: Right: From Medial Instep 39.5 cm Vascular Assessment Pulses: Dorsalis Pedis Palpable: [Left:Yes] Doppler Audible: [Left:Yes] Wohl, Aviyana A ([id number]) [Right:[id number] Page 4 of 7] Posterior Tibial Palpable: [Left:Yes] Doppler Audible: [Left:Yes] Extremity colors, hair growth, and conditions: Extremity Color: [Left:Normal] Hair Growth on Extremity: [Left:No] Temperature of Extremity: [Left:Warm] Capillary Refill: [Left:< 3 seconds] Dependent Rubor: [Left:No] Blanched when Elevated: [Left:No] Lipodermatosclerosis: [Left:No] Blood Pressure: Brachial: [Left:179] Ankle: [Left:Dorsalis Pedis: 189 1.06] Toe Nail Assessment Left: Right: Thick: Yes Discolored: Yes Deformed: Yes Improper Length and Hygiene: No Electronic Signature(s) Signed: 12/19/2022 2:03:55 PM By: Brenton Grills Entered By: Brenton Grills on 11/29/2022 14:40:09 -------------------------------------------------------------------------------- Multi-Disciplinary Care Plan Details Patient Name: Date of Service: [name], [name] 11/29/2022 2:00 PM Medical Record Number: [id number] Patient Account Number: [id number] Date of Birth/Sex: Treating RN: [birth year] (89 y.o. Gevena Mart Primary Care Emanii Bugbee: Tracey Harries Other Clinician: Referring Marveline Profeta: Treating Asheton Viramontes/Extender: Vaughan Sine in Treatment: 0 Active Inactive Wound/Skin  Impairment Nursing Diagnoses: Knowledge deficit related to smoking impact on wound healing Goals: Patient/caregiver will verbalize understanding of skin care regimen Date Initiated: 11/29/2022 Target Resolution Date: 01/22/2023 Goal Status: Active Interventions: Assess patient/caregiver ability to obtain necessary supplies Assess patient/caregiver ability to perform ulcer/skin care regimen upon admission and as needed Assess ulceration(s) every visit Provide education on ulcer and skin care Screen for HBO Treatment Activities: Skin care regimen initiated : 11/29/2022 Topical wound management initiated : 11/29/2022 Notes: Electronic Signature(s) Signed: 12/19/2022 2:03:55 PM By: Brenton Grills Entered By: Brenton Grills on 11/29/2022 15:25:58 Akkerman, Marquasha A ([id number]) [id number] Page 5 of 7 -------------------------------------------------------------------------------- Pain Assessment Details Patient Name: Date of Service: [name] ERLY A. 11/29/2022 2:00 PM Medical Record Number: [id number] Patient Account Number: [id number] Date of Birth/Sex: Treating RN: 1929-09-13 (89 y.o. Gevena Mart Primary Care Yoandri Congrove: Tracey Harries Other Clinician: Referring Demarquis Osley: Treating Joven Mom/Extender: Lenda Kelp  Jaquita Folds in Treatment: 0 Active Problems Location of Pain Severity and Description of Pain Patient Has Paino No Site Locations Pain Management and Medication Current Pain Management: Electronic Signature(s) Signed: 12/19/2022 2:03:55 PM By: Brenton Grills Entered By: Brenton Grills on 11/29/2022 14:41:52 -------------------------------------------------------------------------------- Patient/Caregiver Education Details Patient Name: Date of Service: [name] A. 8/7/2024andnbsp2:00 PM Medical Record Number: [id number] Patient Account Number: [id number] Date of Birth/Gender: Treating RN: 1929-11-10 (89 y.o. Gevena Mart Primary Care Physician: Tracey Harries Other Clinician: Referring Physician: Treating Physician/Extender: Vaughan Sine in Treatment: 0 Education Assessment Education Provided To: Patient and Caregiver Education Topics Provided Wound/Skin Impairment: Methods: Explain/Verbal WANISHA, DELFOSSE A ([id number]) [id number] Page 6 of 7 Responses: State content correctly Electronic Signature(s) Signed: 12/19/2022 2:03:55 PM By: Brenton Grills Entered By: Brenton Grills on 11/29/2022 14:45:55 -------------------------------------------------------------------------------- Wound Assessment Details Patient Name: Date of Service: [name] A. 11/29/2022 2:00 PM Medical Record Number: [id number] Patient Account Number: [id number] Date of Birth/Sex: Treating RN: May 07, 1929 (89 y.o. F) Primary Care Saraann Enneking: Tracey Harries Other Clinician: Referring Saramarie Stinger: Treating Sharaine Delange/Extender: Vaughan Sine in Treatment: 0 Wound Status Wound Number: 1 Primary T be determined o Etiology: Wound Location: Left, Anterior Lower Leg Wound Open Wounding Event: Trauma Status: Date Acquired: 08/29/2022 Comorbid Cataracts, Anemia, Sleep Apnea, Congestive Heart Failure, Weeks Of Treatment: 0 History: Coronary Artery Disease, Hypertension, Myocardial Infarction, Clustered Wound: No Gout, Osteoarthritis, Neuropathy Photos Wound Measurements Length: (cm) Width: (cm) Depth: (cm) Area: (cm) Volume: (cm) 7.5 % Reduction in Area: 2 % Reduction in Volume: 0.1 11.781 1.178 Wound Description Classification: Full Thickness Without Exposed Support Structures Wound Margin: Distinct, outline attached Exudate Amount: Medium Exudate Type: Serosanguineous Exudate Color: red, brown Foul Odor After Cleansing: No Slough/Fibrino No Wound Bed Granulation Amount: Medium (34-66%) Granulation Quality: Red, Pink Necrotic Amount: Medium  (34-66%) Necrotic Quality: Eschar, Adherent Slough Periwound Skin Texture Texture Color No Abnormalities Noted: Yes No Abnormalities Noted: Yes Moisture Temperature / Pain No Abnormalities Noted: No Temperature: No Abnormality Dry / Scaly: No Atiyeh, Jeremiah A ([id number]) [id number] Page 7 of 7 Maceration: No Electronic Signature(s) Signed: 12/19/2022 2:03:55 PM By: Brenton Grills Entered By: Brenton Grills on 11/29/2022 15:11:18 -------------------------------------------------------------------------------- Vitals Details Patient Name: Date of Service: [name], [name] 11/29/2022 2:00 PM Medical Record Number: [id number] Patient Account Number: [id number] Date of Birth/Sex: Treating RN: 1929-11-20 (89 y.o. F) Primary Care Kelis Plasse: Tracey Harries Other Clinician: Referring Tallon Gertz: Treating Paulanthony Gleaves/Extender: Vaughan Sine in Treatment: 0 Vital Signs Time Taken: 14:09 Temperature (F): 97.7 Height (in): 61 Pulse (bpm): 97 Source: Stated Respiratory Rate (breaths/min): 18 Weight (lbs): 112 Blood Pressure (mmHg): 179/93 Source: Stated Reference Range: 80 - 120 mg / dl Body Mass Index (BMI): 21.2 Electronic Signature(s) Signed: 12/01/2022 12:52:02 PM By: Thayer Dallas Entered By: Thayer Dallas on 11/29/2022 14:10:28

## 2022-12-19 NOTE — Progress Notes (Signed)
STEHANIE, KUKULKA [name] ([id number]) [phone].pdf Page 1 of 4 Visit Report for 11/29/2022 Abuse Risk Screen Details Patient Name: Date of Service: [name]. 11/29/2022 2:00 PM Medical Record Number: [id number] Patient Account Number: [id number] Date of Birth/Sex: Treating RN: [name] (89 y.o. F) Primary Care Stanislaw Acton: Tracey Harries Other Clinician: Referring Herold Salguero: Treating Shanielle Correll/Extender: Vaughan Sine in Treatment: 0 Abuse Risk Screen Items Answer ABUSE RISK SCREEN: Has anyone close to you tried to hurt or harm you recentlyo No Do you feel uncomfortable with anyone in your familyo No Has anyone forced you do things that you didnt want to doo No Electronic Signature(s) Signed: 12/01/2022 12:52:02 PM By: Thayer Dallas Entered By: Thayer Dallas on 11/29/2022 14:33:22 -------------------------------------------------------------------------------- Activities of Daily Living Details Patient Name: Date of Service: [name]. 11/29/2022 2:00 PM Medical Record Number: [id number] Patient Account Number: [id number] Date of Birth/Sex: Treating RN: [name][birth year] (89 y.o. F) Primary Care Destyn Schuyler: Tracey Harries Other Clinician: Referring Hollye Pritt: Treating Sady Monaco/Extender: Vaughan Sine in Treatment: 0 Activities of Daily Living Items Answer Activities of Daily Living (Please select one for each item) Drive Automobile Not Able T Medications ake Completely Able Use T elephone Completely Able Care for Appearance Completely Able Use T oilet Completely Able Bath / Shower Completely Able Dress Self Completely Able Feed Self Completely Able Walk Completely Able Get In / Out Bed Completely Able Housework Completely Able Prepare Meals Completely Able Handle Money Completely Able Shop for Self Completely Able Electronic Signature(s) Signed: 12/01/2022 12:52:02 PM By: Thayer Dallas Entered  By: Thayer Dallas on 11/29/2022 14:34:01 Votta, Amie [name] ([id number]) [id number] Page 2 of 4 -------------------------------------------------------------------------------- Education Screening Details Patient Name: Date of Service: [name]. 11/29/2022 2:00 PM Medical Record Number: [id number] Patient Account Number: [id number] Date of Birth/Sex: Treating RN: [birth year] (89 y.o. F) Primary Care Oval Moralez: Tracey Harries Other Clinician: Referring Gwenette Wellons: Treating Chriselda Leppert/Extender: Vaughan Sine in Treatment: 0 Primary Learner Assessed: Patient Learning Preferences/Education Level/Primary Language Learning Preference: Explanation, Demonstration, Printed Material Highest Education Level: College or Above Preferred Language: English Cognitive Barrier Language Barrier: No Translator Needed: No Memory Deficit: No Emotional Barrier: No Cultural/Religious Beliefs Affecting Medical Care: No Physical Barrier Impaired Vision: Yes Glasses Impaired Hearing: No Decreased Hand dexterity: No Knowledge/Comprehension Knowledge Level: High Comprehension Level: High Ability to understand written instructions: High Ability to understand verbal instructions: High Motivation Anxiety Level: Calm Cooperation: Cooperative Education Importance: Acknowledges Need Interest in Health Problems: Asks Questions Perception: Coherent Willingness to Engage in Self-Management High Activities: Readiness to Engage in Self-Management High Activities: Electronic Signature(s) Signed: 12/01/2022 12:52:02 PM By: Thayer Dallas Entered By: Thayer Dallas on 11/29/2022 14:34:39 -------------------------------------------------------------------------------- Fall Risk Assessment Details Patient Name: Date of Service: Gwendolyn Loosen [name]. 11/29/2022 2:00 PM Medical Record Number: [id number] Patient Account Number: [id number] Date of Birth/Sex:  Treating RN: [name][birth year] (89 y.o. F) Primary Care Sherece Gambrill: Tracey Harries Other Clinician: Referring Madden Garron: Treating Tyrees Chopin/Extender: Vaughan Sine in Treatment: 0 Fall Risk Assessment Items Have you had 2 or more falls in the last 12 monthso 0 Yes [name], [name] ([id number]) [phone] [id number] Page 3 of 4 Have you had any fall that resulted in injury in the last 12 monthso 0 No FALLS RISK SCREEN History of falling - immediate or within 3 months 25 Yes Secondary diagnosis (Do you have 2 or more medical diagnoseso) 0 No Ambulatory aid None/bed  rest/wheelchair/nurse 0 No Crutches/cane/walker 15 Yes Furniture 0 No Intravenous therapy Access/Saline/Heparin Lock 0 No Gait/Transferring Normal/ bed rest/ wheelchair 0 Yes Weak (short steps with or without shuffle, stooped but able to lift head while walking, may seek 0 No support from furniture) Impaired (short steps with shuffle, may have difficulty arising from chair, head down, impaired 0 No balance) Mental Status Oriented to own ability 0 Yes Electronic Signature(s) Signed: 12/01/2022 12:52:02 PM By: Thayer Dallas Entered By: Thayer Dallas on 11/29/2022 14:35:27 -------------------------------------------------------------------------------- Foot Assessment Details Patient Name: Date of Service: Gwendolyn Loosen [name]. 11/29/2022 2:00 PM Medical Record Number: [id number] Patient Account Number: [id number] Date of Birth/Sex: Treating RN: [birth year] (89 y.o. [name] Primary Care Aoki Wedemeyer: Tracey Harries Other Clinician: Referring Donesha Wallander: Treating Haunani Dickard/Extender: Vaughan Sine in Treatment: 0 Foot Assessment Items Site Locations + = Sensation present, - = Sensation absent, C = Callus, U = Ulcer R = Redness, W = Warmth, M = Maceration, PU = Pre-ulcerative lesion F = Fissure, S = Swelling, D = Dryness Assessment Right: Left: Other Deformity:  No No Prior Foot Ulcer: No No Prior Amputation: No No Charcot Joint: No No Ambulatory Status: Ambulatory Without Help Gait: Steady [name], [name] ([id number]) [id number] Page 4 of 4 Electronic Signature(s) Signed: 12/19/2022 2:03:55 PM By: Brenton Grills Entered By: Brenton Grills on 11/29/2022 14:41:41 -------------------------------------------------------------------------------- Nutrition Risk Screening Details Patient Name: Date of Service: Gwendolyn Edward [facility] [name]. 11/29/2022 2:00 PM Medical Record Number: [id number] Patient Account Number: [id number] Date of Birth/Sex: Treating RN: [name], [name] (89 y.o. F) Primary Care Sheehan Stacey: Tracey Harries Other Clinician: Referring Luverna Degenhart: Treating Brooklynne Pereida/Extender: Vaughan Sine in Treatment: 0 Height (in): 61 Weight (lbs): 112 Body Mass Index (BMI): 21.2 Nutrition Risk Screening Items Score Screening NUTRITION RISK SCREEN: I have an illness or condition that made me change the kind and/or amount of food I eat 0 No I eat fewer than two meals per day 0 No I eat few fruits and vegetables, or milk products 0 No I have three or more drinks of beer, liquor or wine almost every day 0 No I have tooth or mouth problems that make it hard for me to eat 0 No I don't always have enough money to buy the food I need 0 No I eat alone most of the time 0 No I take three or more different prescribed or over-the-counter drugs [name] day 1 Yes Without wanting to, I have lost or gained [name] pounds in the last six months 0 No I am not always physically able to shop, cook and/or feed myself 0 No Nutrition Protocols Good Risk Protocol 0 No interventions needed Moderate Risk Protocol High Risk Proctocol Risk Level: Good Risk Score: 1 Electronic Signature(s) Signed: 12/01/2022 12:52:02 PM By: Thayer Dallas Entered By: Thayer Dallas on 11/29/2022 14:36:32

## 2022-12-19 NOTE — Progress Notes (Signed)
MUNTAS, RECKTENWALD [name] ([id number]) [id number] Page 1 of 10 Visit Report for 11/29/2022 Chief Complaint Document Details Patient Name: Date of Service: [name] [facility] [name]. 11/29/2022 2:00 PM Medical Record Number: [id number] Patient Account Number: [id number] Date of Birth/Sex: Treating RN: 1929/09/14 (89 y.o. F) Primary Care Provider: Tracey Harries Other Clinician: Referring Provider: Treating Provider/Extender: Vaughan Sine in Treatment: 0 Information Obtained from: Patient Chief Complaint Left LE Ulcer Electronic Signature(s) Signed: 11/29/2022 2:56:23 PM By: Allen Derry PA-C Entered By: Allen Derry on 11/29/2022 14:56:22 -------------------------------------------------------------------------------- Debridement Details Patient Name: Date of Service: [name] [name]. 11/29/2022 2:00 PM Medical Record Number: [id number] Patient Account Number: [id number] Date of Birth/Sex: Treating RN: [birth year] (89 y.o. F) Primary Care Provider: Tracey Harries Other Clinician: Referring Provider: Treating Provider/Extender: Vaughan Sine in Treatment: 0 Debridement Performed for Assessment: Wound #1 Left,Anterior Lower Leg Performed By: Physician Lenda Kelp, PA Debridement Type: Debridement Level of Consciousness (Pre-procedure): Awake and Alert Pre-procedure Verification/Time Out Yes - 15:02 Taken: Start Time: 15:03 Pain Control: Lidocaine 4% T opical Solution Percent of Wound Bed Debrided: 10% T Area Debrided (cm): otal 1.18 Tissue and other material debrided: Viable, Non-Viable, Slough, Subcutaneous, Biofilm, Slough Level: Skin/Subcutaneous Tissue Debridement Description: Excisional Instrument: Curette Bleeding: Minimum Hemostasis Achieved: Pressure End Time: 15:05 Procedural Pain: 0 Post Procedural Pain: 0 Response to Treatment: Procedure was tolerated well Level of Consciousness (Post- Awake and  Alert procedure): Post Debridement Measurements of Total Wound Length: (cm) 7.5 Width: (cm) 2 Depth: (cm) 0.1 Volume: (cm) 1.178 Character of Wound/Ulcer Post Debridement: Stable [name], [name] [name] ([id number]) [id number] Page 2 of 10 Post Procedure Diagnosis Same as Pre-procedure Notes Scribed for Dr Leonard Schwartz by Brenton Grills RN Electronic Signature(s) Signed: 11/29/2022 7:41:39 PM By: Allen Derry PA-C Entered By: Allen Derry on 11/29/2022 19:41:39 -------------------------------------------------------------------------------- HPI Details Patient Name: Date of Service: [name]. 11/29/2022 2:00 PM Medical Record Number: [id number] Patient Account Number: [id number] Date of Birth/Sex: Treating RN: November 01, 1929 (89 y.o. F) Primary Care Provider: Tracey Harries Other Clinician: Referring Provider: Treating Provider/Extender: Vaughan Sine in Treatment: 0 History of Present Illness ssociated Signs and Symptoms: 11-29-2022 patient's ABI was 1.06 in the clinic today and appears to be sufficient in order to heal her wound. No further [name] vascular evaluation is necessitated at this point. HPI Description: 11-29-2022 upon evaluation today patient presents for evaluation here in the clinic secondary to issues that she has been having with [name] over the left anterior shin. This actually is [name] that occurred roughly 3 months ago when she struck this leg on [name] table. Subsequently initially there was [name] significant skin tear her caretakers that were with her today did show me [name] picture of what the wound looks like initially and compared to that this is [name] far cry and much improved compared to what was noted previous. With that being said I do see [name] significant mount of slough and biofilm buildup I do believe she is going require some sharp debridement to clearway some of the necrotic debris. Right now they been leaving this open to air as [name]  recommendation from [name] previous provider. Patient does have [name] history of chronic venous insufficiency, coronary artery disease, chronic kidney disease stage III, and hypertension. Electronic Signature(s) Signed: 11/29/2022 7:36:03 PM By: Allen Derry PA-C Entered By: Allen Derry on 11/29/2022 19:36:03 -------------------------------------------------------------------------------- Physical Exam Details Patient Name: Date of Service: PREV [name]  TT, [name]. 11/29/2022 2:00 PM Medical Record Number: [id number] Patient Account Number: [id number] Date of Birth/Sex: Treating RN: 11-16-1929 (89 y.o. F) Primary Care Provider: Tracey Harries Other Clinician: Referring Provider: Treating Provider/Extender: Vaughan Sine in Treatment: 0 Constitutional patient is hypertensive.. pulse regular and within target range for patient.Marland Kitchen respirations regular, non-labored and within target range for patient.Marland Kitchen temperature within target range for patient.. Well-nourished and well-hydrated in no acute distress. Eyes conjunctiva clear no eyelid edema noted. pupils equal round and reactive to light and accommodation. Ears, Nose, Mouth, and Throat no gross abnormality of ear auricles or external auditory canals. normal hearing noted during conversation. mucus membranes moist. Respiratory normal breathing without difficulty. GRETTEL, PFEILER [name] ([id number]) [id number] Page 3 of 10 Cardiovascular 2+ dorsalis pedis/posterior tibialis pulses. no clubbing, cyanosis, significant edema, <3 sec cap refill. Musculoskeletal normal gait and posture. no significant deformity or arthritic changes, no loss or range of motion, no clubbing. Psychiatric this patient is able to make decisions and demonstrates good insight into disease process. Alert and Oriented x 3. pleasant and cooperative. Notes Upon inspection patient's wound bed actually showed signs of having some minimal swelling  of the lower extremity at this point which is unfortunate and I think contributing to her slow healing progress at this point. With that being said the leg is not terribly enlarged which is good news. She does seem to have swelling that I think may be secondary to the wound and injury. Her blood flow seems to be doing excellent the screening ABI was good today as well and overall I think that we are on the right track as far as healing is concerned. Fortunately she is not having too much pain either. Electronic Signature(s) Signed: 11/29/2022 7:36:41 PM By: Allen Derry PA-C Entered By: Allen Derry on 11/29/2022 19:36:41 -------------------------------------------------------------------------------- Physician Orders Details Patient Name: Date of Service: [name]. 11/29/2022 2:00 PM Medical Record Number: [id number] Patient Account Number: [id number] Date of Birth/Sex: Treating RN: May 16, 1929 (89 y.o. Gevena Mart Primary Care Provider: Tracey Harries Other Clinician: Referring Provider: Treating Provider/Extender: Vaughan Sine in Treatment: 0 Verbal / Phone Orders: No Diagnosis Coding ICD-10 Coding Code Description [phone] Chronic venous hypertension (idiopathic) with ulcer and inflammation of left lower extremity L97.822 Non-pressure chronic ulcer of other part of left lower leg with fat layer exposed I25.10 Atherosclerotic heart disease of native coronary artery without angina pectoris N18.30 Chronic kidney disease, stage 3 unspecified I10 Essential (primary) hypertension Follow-up Appointments Return Appointment in 1 week. Anesthetic (In clinic) Topical Lidocaine 4% applied to wound bed Bathing/ Shower/ Hygiene May shower with protection but do not get wound dressing(s) wet. Protect dressing(s) with water repellant cover (for example, large plastic bag) or [name] cast cover and may then take shower. Edema Control - Lymphedema / SCD /  Other Bilateral Lower Extremities Elevate legs to the level of the heart or above for 30 minutes daily and/or when sitting for 3-4 times [name] day throughout the day. Wound Treatment Wound #1 - Lower Leg Wound Laterality: Left, Anterior Peri-Wound Care: Zinc Oxide Ointment 30g tube Every Other Day/30 Days Discharge Instructions: Apply Zinc Oxide to periwound with each dressing change Prim Dressing: Hydrofera Blue Ready Transfer Foam, 4x5 (in/in) Every Other Day/30 Days ary Discharge Instructions: Apply to wound bed as instructed Secondary Dressing: ABD Pad, 5x9 (DME) (Generic) Every Other Day/30 Days Discharge Instructions: Apply over primary dressing as directed. Secured With: [employer],  4.5x3.1 (in/yd) (DME) (Generic) Every Other Day/30 Days Discharge Instructions: Secure with Kerlix as directed. JACARI, KOLESNIKOV [name] ([id number]) [id number] Page 4 of 10 Secured With: Paper Tape, 2x10 (in/yd) (DME) (Generic) Every Other Day/30 Days Discharge Instructions: Secure dressing with tape as directed. Compression Wrap: Tubigrip Every Other Day/30 Days Electronic Signature(s) Signed: 11/29/2022 7:55:21 PM By: Allen Derry PA-C Signed: 12/19/2022 2:03:55 PM By: Brenton Grills Entered By: Brenton Grills on 11/29/2022 15:25:49 -------------------------------------------------------------------------------- Problem List Details Patient Name: Date of Service: [name]. 11/29/2022 2:00 PM Medical Record Number: [id number] Patient Account Number: [id number] Date of Birth/Sex: Treating RN: 12/01/1929 (89 y.o. Gevena Mart Primary Care Provider: Tracey Harries Other Clinician: Referring Provider: Treating Provider/Extender: Vaughan Sine in Treatment: 0 Active Problems ICD-10 Encounter Code Description Active Date MDM Diagnosis I87.332 Chronic venous hypertension (idiopathic) with ulcer and inflammation of left 11/29/2022 No  Yes lower extremity L97.822 Non-pressure chronic ulcer of other part of left lower leg with fat layer exposed8/10/2022 No Yes I25.10 Atherosclerotic heart disease of native coronary artery without angina pectoris 11/29/2022 No Yes N18.30 Chronic kidney disease, stage 3 unspecified 11/29/2022 No Yes I10 Essential (primary) hypertension 11/29/2022 No Yes Inactive Problems Resolved Problems Electronic Signature(s) Signed: 11/29/2022 2:56:08 PM By: Allen Derry PA-C Entered By: Allen Derry on 11/29/2022 14:56:07 Progress Note Details -------------------------------------------------------------------------------- Mariel Craft ([id number]) [id number] Page 5 of 10 Patient Name: Date of Service: [name] [name]. 11/29/2022 2:00 PM Medical Record Number: [id number] Patient Account Number: [id number] Date of Birth/Sex: Treating RN: 10/13/1929 (89 y.o. F) Primary Care Provider: Tracey Harries Other Clinician: Referring Provider: Treating Provider/Extender: Vaughan Sine in Treatment: 0 Subjective Chief Complaint Information obtained from Patient Left LE Ulcer History of Present Illness (HPI) The following HPI elements were documented for the patient's wound: Associated Signs and Symptoms: 11-29-2022 patient's ABI was 1.06 in the clinic today and appears to be sufficient in order to heal her wound. No further vascular evaluation is necessitated at this point. 11-29-2022 upon evaluation today patient presents for evaluation here in the clinic secondary to issues that she has been having with [name] over the left anterior shin. This actually is [name] that occurred roughly 3 months ago when she struck this leg on [name] table. Subsequently initially there was [name] significant skin tear her caretakers that were with her today did show me [name] picture of what the wound looks like initially and compared to that this is [name] far cry and much improved compared to what  was noted previous. With that being said I do see [name] significant mount of slough and biofilm buildup I do believe she is going require some sharp debridement to clearway some of the necrotic debris. Right now they been leaving this open to air as [name] recommendation from [name] previous provider. Patient does have [name] history of chronic venous insufficiency, coronary artery disease, chronic kidney disease stage III, and hypertension. Patient History Information obtained from Patient. Allergies codeine (Reaction: hypertension), latex, adhesive tape Family History Diabetes - Mother, Heart Disease - Mother, Hypertension - Mother, No family history of Cancer, Hereditary Spherocytosis, Kidney Disease, Lung Disease, Seizures, Stroke, Thyroid Problems, Tuberculosis. Social History Never smoker, Marital Status - Widowed, Alcohol Use - Never, Drug Use - No History, Caffeine Use - Daily. Medical History Eyes Patient has history of Cataracts - removed Denies history of Glaucoma, Optic Neuritis Ear/Nose/Mouth/Throat Denies history of Chronic sinus problems/congestion, Middle ear problems Hematologic/Lymphatic Patient  has history of Anemia Denies history of Hemophilia, Human Immunodeficiency Virus, Lymphedema, Sickle Cell Disease Respiratory Patient has history of Sleep Apnea - wears cpap Denies history of Aspiration, Asthma, Chronic Obstructive Pulmonary Disease (COPD), Pneumothorax, Tuberculosis Cardiovascular Patient has history of Congestive Heart Failure, Coronary Artery Disease, Hypertension, Myocardial Infarction Denies history of Angina, Arrhythmia, Deep Vein Thrombosis, Hypotension, Peripheral Arterial Disease, Peripheral Venous Disease, Phlebitis, Vasculitis Gastrointestinal Denies history of Cirrhosis , Colitis, Crohns, Hepatitis [name], Hepatitis B, Hepatitis C Integumentary (Skin) Denies history of History of Burn Musculoskeletal Patient has history of Gout, Osteoarthritis Denies history of  Rheumatoid Arthritis, Osteomyelitis Neurologic Patient has history of Neuropathy Denies history of Dementia, Quadriplegia, Paraplegia, Seizure Disorder Oncologic Denies history of Received Chemotherapy, Received Radiation Psychiatric Denies history of Anorexia/bulimia, Confinement Anxiety Hospitalization/Surgery History - Stent placed in 2019. - knee replacement in 2023. - Hip fx 2022. - cholecystectomy. - hysterotomy. Medical [name] Surgical History Notes nd Ear/Nose/Mouth/Throat trouble swallowing Gastrointestinal IBS Oncologic skin cancer Review of Systems (ROS) Constitutional Symptoms (General Health) Complains or has symptoms of Fatigue. Denies complaints or symptoms of Fever, Chills, Marked Weight Change. Eyes Complains or has symptoms of Glasses / Contacts. Denies complaints or symptoms of Dry Eyes, Vision Changes. Ear/Nose/Mouth/Throat MALOREY, HIRSHMAN [name] ([id number]) [id number] Page 6 of 10 Denies complaints or symptoms of Chronic sinus problems or rhinitis. Respiratory Denies complaints or symptoms of Chronic or frequent coughs, Shortness of Breath. Gastrointestinal Complains or has symptoms of Frequent diarrhea. Denies complaints or symptoms of Nausea, Vomiting. Endocrine Denies complaints or symptoms of Heat/cold intolerance. Genitourinary Denies complaints or symptoms of Frequent urination. Integumentary (Skin) Complains or has symptoms of Wounds - left lower leg. Psychiatric Denies complaints or symptoms of Claustrophobia. Objective Constitutional patient is hypertensive.. pulse regular and within target range for patient.Marland Kitchen respirations regular, non-labored and within target range for patient.Marland Kitchen temperature within target range for patient.. Well-nourished and well-hydrated in no acute distress. Vitals Time Taken: 2:09 PM, Height: 61 in, Source: Stated, Weight: 112 lbs, Source: Stated, BMI: 21.2, Temperature: 97.7 F, Pulse: 97 bpm,  Respiratory Rate: 18 breaths/min, Blood Pressure: 179/93 mmHg. Eyes conjunctiva clear no eyelid edema noted. pupils equal round and reactive to light and accommodation. Ears, Nose, Mouth, and Throat no gross abnormality of ear auricles or external auditory canals. normal hearing noted during conversation. mucus membranes moist. Respiratory normal breathing without difficulty. Cardiovascular 2+ dorsalis pedis/posterior tibialis pulses. no clubbing, cyanosis, significant edema, Musculoskeletal normal gait and posture. no significant deformity or arthritic changes, no loss or range of motion, no clubbing. Psychiatric this patient is able to make decisions and demonstrates good insight into disease process. Alert and Oriented x 3. pleasant and cooperative. General Notes: Upon inspection patient's wound bed actually showed signs of having some minimal swelling of the lower extremity at this point which is unfortunate and I think contributing to her slow healing progress at this point. With that being said the leg is not terribly enlarged which is good news. She does seem to have swelling that I think may be secondary to the wound and injury. Her blood flow seems to be doing excellent the screening ABI was good today as well and overall I think that we are on the right track as far as healing is concerned. Fortunately she is not having too much pain either. Integumentary (Hair, Skin) Wound #1 status is Open. Original cause of wound was Trauma. The date acquired was: 08/29/2022. The wound is located on the Left,Anterior Lower Leg. The wound measures 7.5cm length x  2cm width x 0.1cm depth; 11.781cm^2 area and 1.178cm^3 volume. There is [name] medium amount of serosanguineous drainage noted. The wound margin is distinct with the outline attached to the wound base. There is medium (34-66%) red, pink granulation within the wound bed. There is [name] medium (34-66%) amount of necrotic tissue within the wound bed  including Eschar and Adherent Slough. The periwound skin appearance had no abnormalities noted for texture. The periwound skin appearance had no abnormalities noted for color. The periwound skin appearance did not exhibit: Dry/Scaly, Maceration. Periwound temperature was noted as No Abnormality. Assessment Active Problems ICD-10 Chronic venous hypertension (idiopathic) with ulcer and inflammation of left lower extremity Non-pressure chronic ulcer of other part of left lower leg with fat layer exposed Atherosclerotic heart disease of native coronary artery without angina pectoris Chronic kidney disease, stage 3 unspecified Essential (primary) hypertension Procedures Wound #1 Pre-procedure diagnosis of Wound #1 is [name] T be determined located on the Left,Anterior Lower Leg . There was [name] Excisional Skin/Subcutaneous Tissue o Debridement with [name] total area of 1.18 sq cm performed by Lenda Kelp, PA. With the following instrument(s): Curette to remove Viable and Non-Viable tissue/material. Material removed includes Subcutaneous Tissue, Slough, and Biofilm after achieving pain control using Lidocaine 4% Topical Solution. No Drumwright, Ciana [name] ([id number]) [id number] Page 7 of 10 specimens were taken. [name] time out was conducted at 15:02, prior to the start of the procedure. [name] Minimum amount of bleeding was controlled with Pressure. The procedure was tolerated well with [name] pain level of 0 throughout and [name] pain level of 0 following the procedure. Post Debridement Measurements: 7.5cm length x 2cm width x 0.1cm depth; 1.178cm^3 volume. Character of Wound/Ulcer Post Debridement is stable. Post procedure Diagnosis Wound #1: Same as Pre-Procedure General Notes: Scribed for Dr Leonard Schwartz by Brenton Grills RN. Plan Follow-up Appointments: Return Appointment in 1 week. Anesthetic: (In clinic) Topical Lidocaine 4% applied to wound bed Bathing/ Shower/ Hygiene: May shower with protection  but do not get wound dressing(s) wet. Protect dressing(s) with water repellant cover (for example, large plastic bag) or [name] cast cover and may then take shower. Edema Control - Lymphedema / SCD / Other: Elevate legs to the level of the heart or above for 30 minutes daily and/or when sitting for 3-4 times [name] day throughout the day. WOUND #1: - Lower Leg Wound Laterality: Left, Anterior Peri-Wound Care: Zinc Oxide Ointment 30g tube Every Other Day/30 Days Discharge Instructions: Apply Zinc Oxide to periwound with each dressing change Prim Dressing: Hydrofera Blue Ready Transfer Foam, 4x5 (in/in) Every Other Day/30 Days ary Discharge Instructions: Apply to wound bed as instructed Secondary Dressing: ABD Pad, 5x9 (DME) (Generic) Every Other Day/30 Days Discharge Instructions: Apply over primary dressing as directed. Secured With: [employer], 4.5x3.1 (in/yd) (DME) (Generic) Every Other Day/30 Days Discharge Instructions: Secure with Kerlix as directed. Secured With: Paper T ape, 2x10 (in/yd) (DME) (Generic) Every Other Day/30 Days Discharge Instructions: Secure dressing with tape as directed. Com pression Wrap: Tubigrip Every Other Day/30 Days 1. I am going to recommend that we have the patient continue to monitor for any signs of infection or worsening. Based on what I am seeing I do believe that the patient is making pretty good headway towards closure but I think there is some things we can definitely do differently that will speed this up. She has been using Silvadene previous but I do not think that is can be ideal. I am going to recommend that we go  ahead and see about getting her started with [name] Hydrofera Blue dressing with use some AandD ointment around the edges of the wound. 2. I am going to recommend that we have the patient continue to elevate her legs much as possible will use Tubigrip just to try to see if we can keep the swelling down I think this should be pretty good for her  without causing any significant discomfort. 3. I am also can recommend that we use roll gauze secured with tape I do not want any adhesive on her skin. We will see patient back for reevaluation in 1 week here in the clinic. If anything worsens or changes patient will contact our office for additional recommendations. Electronic Signature(s) Signed: 11/29/2022 7:41:48 PM By: Allen Derry PA-C Previous Signature: 11/29/2022 7:37:34 PM Version By: Allen Derry PA-C Entered By: Allen Derry on 11/29/2022 19:41:48 -------------------------------------------------------------------------------- HxROS Details Patient Name: Date of Service: [name]. 11/29/2022 2:00 PM Medical Record Number: [id number] Patient Account Number: [id number] Date of Birth/Sex: Treating RN: 1929-07-17 (89 y.o. F) Primary Care Provider: Tracey Harries Other Clinician: Referring Provider: Treating Provider/Extender: Vaughan Sine in Treatment: 0 Information Obtained From Patient Constitutional Symptoms (General Health) Complaints and Symptoms: Positive for: Fatigue Negative for: Fever; Chills; Marked Weight Change Eyes Complaints and Symptoms: Hurless, Allanna [name] ([id number]) [id number] Page 8 of 10 Positive for: Glasses / Contacts Negative for: Dry Eyes; Vision Changes Medical History: Positive for: Cataracts - removed Negative for: Glaucoma; Optic Neuritis Ear/Nose/Mouth/Throat Complaints and Symptoms: Negative for: Chronic sinus problems or rhinitis Medical History: Negative for: Chronic sinus problems/congestion; Middle ear problems Past Medical History Notes: trouble swallowing Respiratory Complaints and Symptoms: Negative for: Chronic or frequent coughs; Shortness of Breath Medical History: Positive for: Sleep Apnea - wears cpap Negative for: Aspiration; Asthma; Chronic Obstructive Pulmonary Disease (COPD); Pneumothorax;  Tuberculosis Gastrointestinal Complaints and Symptoms: Positive for: Frequent diarrhea Negative for: Nausea; Vomiting Medical History: Negative for: Cirrhosis ; Colitis; Crohns; Hepatitis [name]; Hepatitis B; Hepatitis C Past Medical History Notes: IBS Endocrine Complaints and Symptoms: Negative for: Heat/cold intolerance Genitourinary Complaints and Symptoms: Negative for: Frequent urination Integumentary (Skin) Complaints and Symptoms: Positive for: Wounds - left lower leg Medical History: Negative for: History of Burn Psychiatric Complaints and Symptoms: Negative for: Claustrophobia Medical History: Negative for: Anorexia/bulimia; Confinement Anxiety Hematologic/Lymphatic Medical History: Positive for: Anemia Negative for: Hemophilia; Human Immunodeficiency Virus; Lymphedema; Sickle Cell Disease Cardiovascular Medical History: Positive for: Congestive Heart Failure; Coronary Artery Disease; Hypertension; Myocardial Infarction Negative for: Angina; Arrhythmia; Deep Vein Thrombosis; Hypotension; Peripheral Arterial Disease; Peripheral Venous Disease; Phlebitis; Vasculitis Immunological Musculoskeletal Medical History: Positive for: Gout; Osteoarthritis Negative for: Rheumatoid Arthritis; Osteomyelitis Mongillo, Rosamond [name] ([id number]) [id number] Page 9 of 10 Neurologic Medical History: Positive for: Neuropathy Negative for: Dementia; Quadriplegia; Paraplegia; Seizure Disorder Oncologic Medical History: Negative for: Received Chemotherapy; Received Radiation Past Medical History Notes: skin cancer HBO Extended History Items Eyes: Cataracts Immunizations Pneumococcal Vaccine: Received Pneumococcal Vaccination: Yes Received Pneumococcal Vaccination On or After 60th Birthday: Yes Implantable Devices Yes Hospitalization / Surgery History Type of Hospitalization/Surgery Stent placed in 2019 knee replacement in 2023 Hip fx  2022 cholecystectomy hysterotomy Family and Social History Cancer: No; Diabetes: Yes - Mother; Heart Disease: Yes - Mother; Hereditary Spherocytosis: No; Hypertension: Yes - Mother; Kidney Disease: No; Lung Disease: No; Seizures: No; Stroke: No; Thyroid Problems: No; Tuberculosis: No; Never smoker; Marital Status - Widowed; Alcohol Use: Never; Drug Use: No History; Caffeine Use: Daily; Financial Concerns: No; Food,  Clothing or Shelter Needs: No; Support System Lacking: No; Transportation Concerns: No Electronic Signature(s) Signed: 11/29/2022 7:55:21 PM By: Allen Derry PA-C Signed: 12/01/2022 12:52:02 PM By: Thayer Dallas Entered By: Thayer Dallas on 11/29/2022 14:31:55 -------------------------------------------------------------------------------- SuperBill Details Patient Name: Date of Service: [name] [name]. 11/29/2022 Medical Record Number: [id number] Patient Account Number: [id number] Date of Birth/Sex: Treating RN: Dec 12, 1929 (89 y.o. Gevena Mart Primary Care Provider: Tracey Harries Other Clinician: Referring Provider: Treating Provider/Extender: Vaughan Sine in Treatment: 0 Diagnosis Coding ICD-10 Codes Code Description [phone] Chronic venous hypertension (idiopathic) with ulcer and inflammation of left lower extremity L97.822 Non-pressure chronic ulcer of other part of left lower leg with fat layer exposed I25.10 Atherosclerotic heart disease of native coronary artery without angina pectoris N18.30 Chronic kidney disease, stage 3 unspecified I10 Essential (primary) hypertension Hanline, Aryelle [name] ([id number]) [id number] Page 10 of 10 Facility Procedures : CPT4 Code: 46962952 Description: WOUND CARE VISIT-LEV 3 NEW PT Modifier: 25 Quantity: 1 : CPT4 Code: 84132440 Description: 11042 - DEB SUBQ TISSUE 20 SQ CM/< ICD-10 Diagnosis Description L97.822 Non-pressure chronic ulcer of other part of left lower leg with  fat layer expo Modifier: sed Quantity: 1 Physician Procedures : CPT4 Code Description Modifier [id number] WC PHYS LEVEL 3 NEW PT 25 ICD-10 Diagnosis Description I87.332 Chronic venous hypertension (idiopathic) with ulcer and inflammation of left lower extremity L97.822 Non-pressure chronic ulcer of other part of left  lower leg with fat layer exposed I25.10 Atherosclerotic heart disease of native coronary artery without angina pectoris N18.30 Chronic kidney disease, stage 3 unspecified Quantity: 1 : [id number] 11042 - WC PHYS SUBQ TISS 20 SQ CM ICD-10 Diagnosis Description L97.822 Non-pressure chronic ulcer of other part of left lower leg with fat layer exposed Quantity: 1 Electronic Signature(s) Signed: 11/29/2022 7:42:25 PM By: Allen Derry PA-C Entered By: Allen Derry on 11/29/2022 19:42:24

## 2022-12-20 ENCOUNTER — Encounter (HOSPITAL_BASED_OUTPATIENT_CLINIC_OR_DEPARTMENT_OTHER): Payer: Medicare Other | Admitting: Internal Medicine

## 2022-12-20 DIAGNOSIS — I87332 Chronic venous hypertension (idiopathic) with ulcer and inflammation of left lower extremity: Secondary | ICD-10-CM | POA: Diagnosis not present

## 2022-12-21 NOTE — Progress Notes (Signed)
RAYLAH, CORONEL [name] ([id number]) [id number] Page 1 of 6 Visit Report for 12/20/2022 HPI Details Patient Name: Date of Service: [name]. 12/20/2022 3:45 PM Medical Record Number: [id number] Patient Account Number: [id number] Date of Birth/Sex: Treating RN: 1929-06-22 (89 y.o. F) Primary Care Provider: Tracey Harries Other Clinician: Referring Provider: Treating Provider/Extender: Davene Costain in Treatment: 3 History of Present Illness ssociated Signs and Symptoms: 11-29-2022 patient's ABI was 1.06 in the clinic today and appears to be sufficient in order to heal her wound. No further [name] vascular evaluation is necessitated at this point. HPI Description: 11-29-2022 upon evaluation today patient presents for evaluation here in the clinic secondary to issues that she has been having with [name] over the left anterior shin. This actually is [name] that occurred roughly 3 months ago when she struck this leg on [name] table. Subsequently initially there was [name] significant skin tear her caretakers that were with her today did show me [name] picture of what the wound looks like initially and compared to that this is [name] far cry and much improved compared to what was noted previous. With that being said I do see [name] significant mount of slough and biofilm buildup I do believe she is going require some sharp debridement to clearway some of the necrotic debris. Right now they been leaving this open to air as [name] recommendation from [name] previous provider. Patient does have [name] history of chronic venous insufficiency, coronary artery disease, chronic kidney disease stage III, and hypertension. 12-06-2022 upon evaluation today patient appears to be doing well currently in regard to her wound. She has been tolerating the dressing changes without complication. Fortunately there does not appear to be any signs of active infection locally or systemically which is great news  and in general I do believe that we are making headway towards complete closure which is good news. I do think however she may need [name] compression wrap to get this moving even better into the correct direction. 8/28; this patient had [name] traumatic wound on the left anterior lower leg in the setting of chronic venous insufficiency. She has been using collagen and Xeroform under and Urgo K2 lite wrap. She is doing well the wound is smaller Electronic Signature(s) Signed: 12/20/2022 5:08:25 PM By: Baltazar Najjar MD Entered By: Baltazar Najjar on 12/20/2022 13:38:01 -------------------------------------------------------------------------------- Physical Exam Details Patient Name: Date of Service: [name]. 12/20/2022 3:45 PM Medical Record Number: [id number] Patient Account Number: [id number] Date of Birth/Sex: Treating RN: 04/09/1930 (89 y.o. F) Primary Care Provider: Tracey Harries Other Clinician: Referring Provider: Treating Provider/Extender: Davene Costain in Treatment: 3 Constitutional Patient is hypertensive.. Pulse regular and within target range for patient.Marland Kitchen Respirations regular, non-labored and within target range.. Temperature is normal and within the target range for the patient.Marland Kitchen Appears in no distress. Notes Wound exam; wound on the left anterior mid tibia. This is very clean looking wound. It is epithelializing much smaller. No evidence of infection. Her pedal pulses are palpable both posterior tibial and dorsalis pedis edema control is excellent Electronic Signature(s) Signed: 12/20/2022 5:08:25 PM By: Baltazar Najjar MD Entered By: Baltazar Najjar on 12/20/2022 13:40:30 Lisenby, Meriam Sprague [name] ([id number]) [phone].pdf Page 2 of 6 -------------------------------------------------------------------------------- Physician Orders Details Patient Name: Date of Service: [name] [facility] [name]. 12/20/2022 3:45 PM Medical Record  Number: [id number] Patient Account Number: [id number] Date of Birth/Sex: Treating RN: 12-Jun-1929 (89 y.o. [name] Primary Care Provider:  Tracey Harries Other Clinician: Referring Provider: Treating Provider/Extender: Davene Costain in Treatment: 3 Verbal / Phone Orders: No Diagnosis Coding ICD-10 Coding Code Description [phone] Chronic venous hypertension (idiopathic) with ulcer and inflammation of left lower extremity L97.822 Non-pressure chronic ulcer of other part of left lower leg with fat layer exposed I25.10 Atherosclerotic heart disease of native coronary artery without angina pectoris N18.30 Chronic kidney disease, stage 3 unspecified I10 Essential (primary) hypertension Follow-up Appointments ppointment in 1 week. Leonard Schwartz Wednesday 12/27/2022 3pm Return [name] ppointment in 2 weeks. Leonard Schwartz Wednesday 01/03/2023 3pm Return [name] Return appointment in 3 weeks. Leonard Schwartz Wednesday 01/10/2023 3:00 PM - Wound Care Anesthetic (In clinic) Topical Lidocaine 4% applied to wound bed Bathing/ Shower/ Hygiene May shower with protection but do not get wound dressing(s) wet. Protect dressing(s) with water repellant cover (for example, large plastic bag) or [name] cast cover and may then take shower. Edema Control - Lymphedema / SCD / Other Bilateral Lower Extremities Elevate legs to the level of the heart or above for 30 minutes daily and/or when sitting for 3-4 times [name] day throughout the day. Avoid standing for long periods of time. Exercise regularly Wound Treatment Wound #1 - Lower Leg Wound Laterality: Left, Anterior Cleanser: Soap and Water 1 x Per Week/30 Days Discharge Instructions: May shower and wash wound with dial antibacterial soap and water prior to dressing change. Cleanser: Vashe 5.8 (oz) 1 x Per Week/30 Days Discharge Instructions: Cleanse the wound with Vashe prior to applying [name] clean dressing using gauze sponges, not tissue or cotton balls. Peri-Wound Care:  Triamcinolone 15 (g) 1 x Per Week/30 Days Discharge Instructions: Use triamcinolone 15 (g) as directed Peri-Wound Care: Sween Lotion (Moisturizing lotion) 1 x Per Week/30 Days Discharge Instructions: Apply moisturizing lotion as directed Prim Dressing: Fibracol Plus Dressing, 4x4.38 in (collagen) 1 x Per Week/30 Days ary Discharge Instructions: Moisten collagen with saline or hydrogel Prim Dressing: Xeroform Occlusive Gauze Dressing, 4x4 in 1 x Per Week/30 Days ary Discharge Instructions: Apply over the collagen. Secondary Dressing: Woven Gauze Sponge, Non-Sterile 4x4 in 1 x Per Week/30 Days Discharge Instructions: Apply over primary dressing as directed. Compression Wrap: Urgo K2 Lite, (equivalent to [name] 3 layer) two layer compression system, regular 1 x Per Week/30 Days Discharge Instructions: Apply Urgo K2 Lite as directed (alternative to 3 layer compression). Electronic Signature(s) Signed: 12/20/2022 5:08:25 PM By: Baltazar Najjar MD Signed: 12/20/2022 6:19:00 PM By: Shawn Stall RN, BSN Mccamish, Meriam Sprague [name] ([id number]) [id number] Page 3 of 6 Entered By: Shawn Stall on 12/20/2022 13:33:46 -------------------------------------------------------------------------------- Problem List Details Patient Name: Date of Service: [name] [facility] [name]. 12/20/2022 3:45 PM Medical Record Number: [id number] Patient Account Number: [id number] Date of Birth/Sex: Treating RN: 10-Mar-1930 (89 y.o. [name], Millard.Loa Primary Care Provider: Tracey Harries Other Clinician: Referring Provider: Treating Provider/Extender: Davene Costain in Treatment: 3 Active Problems ICD-10 Encounter Code Description Active Date MDM Diagnosis I87.332 Chronic venous hypertension (idiopathic) with ulcer and inflammation of left 11/29/2022 No Yes lower extremity L97.822 Non-pressure chronic ulcer of other part of left lower leg with fat layer exposed8/10/2022 No Yes I25.10  Atherosclerotic heart disease of native coronary artery without angina pectoris 11/29/2022 No Yes N18.30 Chronic kidney disease, stage 3 unspecified 11/29/2022 No Yes I10 Essential (primary) hypertension 11/29/2022 No Yes Inactive Problems Resolved Problems Electronic Signature(s) Signed: 12/20/2022 5:08:25 PM By: Baltazar Najjar MD Entered By: Baltazar Najjar on 12/20/2022 13:37:16 -------------------------------------------------------------------------------- Progress Note Details Patient Name: Date of Service:  PREV [name] TT, [name]. 12/20/2022 3:45 PM Medical Record Number: [id number] Patient Account Number: [id number] Date of Birth/Sex: Treating RN: [birth year] (89 y.o. F) Primary Care Provider: Tracey Harries Other Clinician: Referring Provider: Treating Provider/Extender: Davene Costain in Treatment: 3 Subjective History of Present Illness (HPI) ERNISHA, CALABAZA [name] ([id number]) [id number] Page 4 of 6 The following HPI elements were documented for the patient's wound: Associated Signs and Symptoms: 11-29-2022 patient's ABI was 1.06 in the clinic today and appears to be sufficient in order to heal her wound. No further vascular evaluation is necessitated at this point. 11-29-2022 upon evaluation today patient presents for evaluation here in the clinic secondary to issues that she has been having with [name] over the left anterior shin. This actually is [name] that occurred roughly 3 months ago when she struck this leg on [name] table. Subsequently initially there was [name] significant skin tear her caretakers that were with her today did show me [name] picture of what the wound looks like initially and compared to that this is [name] far cry and much improved compared to what was noted previous. With that being said I do see [name] significant mount of slough and biofilm buildup I do believe she is going require some sharp debridement to clearway some of the necrotic  debris. Right now they been leaving this open to air as [name] recommendation from [name] previous provider. Patient does have [name] history of chronic venous insufficiency, coronary artery disease, chronic kidney disease stage III, and hypertension. 12-06-2022 upon evaluation today patient appears to be doing well currently in regard to her wound. She has been tolerating the dressing changes without complication. Fortunately there does not appear to be any signs of active infection locally or systemically which is great news and in general I do believe that we are making headway towards complete closure which is good news. I do think however she may need [name] compression wrap to get this moving even better into the correct direction. 8/28; this patient had [name] traumatic wound on the left anterior lower leg in the setting of chronic venous insufficiency. She has been using collagen and Xeroform under and Urgo K2 lite wrap. She is doing well the wound is smaller Objective Constitutional Patient is hypertensive.. Pulse regular and within target range for patient.Marland Kitchen Respirations regular, non-labored and within target range.. Temperature is normal and within the target range for the patient.Marland Kitchen Appears in no distress. Vitals Time Taken: 4:08 PM, Height: 61 in, Weight: 112 lbs, BMI: 21.2, Temperature: 97.9 F, Pulse: 76 bpm, Respiratory Rate: 18 breaths/min, Blood Pressure: 196/84 mmHg. General Notes: Wound exam; wound on the left anterior mid tibia. This is very clean looking wound. It is epithelializing much smaller. No evidence of infection. Her pedal pulses are palpable both posterior tibial and dorsalis pedis edema control is excellent Integumentary (Hair, Skin) Wound #1 status is Open. Original cause of wound was Trauma. The date acquired was: 08/29/2022. The wound has been in treatment 3 weeks. The wound is located on the Left,Anterior Lower Leg. The wound measures 3.2cm length x 0.5cm width x 0.1cm depth; 1.257cm^2 area  and 0.126cm^3 volume. There is Fat Layer (Subcutaneous Tissue) exposed. There is no tunneling or undermining noted. There is [name] medium amount of serosanguineous drainage noted. The wound margin is distinct with the outline attached to the wound base. There is large (67-100%) red granulation within the wound bed. There is [name] small (1-33%) amount of necrotic tissue within the wound  bed including Adherent Slough. The periwound skin appearance had no abnormalities noted for texture. The periwound skin appearance had no abnormalities noted for color. The periwound skin appearance did not exhibit: Dry/Scaly, Maceration. Periwound temperature was noted as No Abnormality. Assessment Active Problems ICD-10 Chronic venous hypertension (idiopathic) with ulcer and inflammation of left lower extremity Non-pressure chronic ulcer of other part of left lower leg with fat layer exposed Atherosclerotic heart disease of native coronary artery without angina pectoris Chronic kidney disease, stage 3 unspecified Essential (primary) hypertension Procedures Wound #1 Pre-procedure diagnosis of Wound #1 is [name] Trauma, Other located on the Left,Anterior Lower Leg . There was [name] Double Layer Compression Therapy Procedure by Shawn Stall, RN. Post procedure Diagnosis Wound #1: Same as Pre-Procedure Plan Follow-up Appointments: Return Appointment in 1 week. Leonard Schwartz Wednesday 12/27/2022 3pm Return Appointment in 2 weeks. Leonard Schwartz Wednesday 01/03/2023 3pm RANDEEP, COLGLAZIER [name] ([id number]) [phone].pdf Page 5 of 6 Return appointment in 3 weeks. Leonard Schwartz Wednesday 01/10/2023 3:00 PM - Wound Care Anesthetic: (In clinic) Topical Lidocaine 4% applied to wound bed Bathing/ Shower/ Hygiene: May shower with protection but do not get wound dressing(s) wet. Protect dressing(s) with water repellant cover (for example, large plastic bag) or [name] cast cover and may then take shower. Edema Control - Lymphedema / SCD /  Other: Elevate legs to the level of the heart or above for 30 minutes daily and/or when sitting for 3-4 times [name] day throughout the day. Avoid standing for long periods of time. Exercise regularly WOUND #1: - Lower Leg Wound Laterality: Left, Anterior Cleanser: Soap and Water 1 x Per Week/30 Days Discharge Instructions: May shower and wash wound with dial antibacterial soap and water prior to dressing change. Cleanser: Vashe 5.8 (oz) 1 x Per Week/30 Days Discharge Instructions: Cleanse the wound with Vashe prior to applying [name] clean dressing using gauze sponges, not tissue or cotton balls. Peri-Wound Care: Triamcinolone 15 (g) 1 x Per Week/30 Days Discharge Instructions: Use triamcinolone 15 (g) as directed Peri-Wound Care: Sween Lotion (Moisturizing lotion) 1 x Per Week/30 Days Discharge Instructions: Apply moisturizing lotion as directed Prim Dressing: Fibracol Plus Dressing, 4x4.38 in (collagen) 1 x Per Week/30 Days ary Discharge Instructions: Moisten collagen with saline or hydrogel Prim Dressing: Xeroform Occlusive Gauze Dressing, 4x4 in 1 x Per Week/30 Days ary Discharge Instructions: Apply over the collagen. Secondary Dressing: Woven Gauze Sponge, Non-Sterile 4x4 in 1 x Per Week/30 Days Discharge Instructions: Apply over primary dressing as directed. Com pression Wrap: Urgo K2 Lite, (equivalent to [name] 3 layer) two layer compression system, regular 1 x Per Week/30 Days Discharge Instructions: Apply Urgo K2 Lite as directed (alternative to 3 layer compression). 1. We are going to continue with the collagen, Xeroform under Urgo K2 light compression. 2. The patient is doing very well the wound is smaller I think we can forecast healing of this in 2 perhaps 3 weeks Electronic Signature(s) Signed: 12/20/2022 5:08:25 PM By: Baltazar Najjar MD Entered By: Baltazar Najjar on 12/20/2022 13:41:51 -------------------------------------------------------------------------------- SuperBill  Details Patient Name: Date of Service: [name]. 12/20/2022 Medical Record Number: [id number] Patient Account Number: [id number] Date of Birth/Sex: Treating RN: 11/12/1929 (89 y.o. [name] Primary Care Provider: Tracey Harries Other Clinician: Referring Provider: Treating Provider/Extender: Davene Costain in Treatment: 3 Diagnosis Coding ICD-10 Codes Code Description [phone] Chronic venous hypertension (idiopathic) with ulcer and inflammation of left lower extremity L97.822 Non-pressure chronic ulcer of other part of left lower leg with  fat layer exposed I25.10 Atherosclerotic heart disease of native coronary artery without angina pectoris N18.30 Chronic kidney disease, stage 3 unspecified I10 Essential (primary) hypertension Facility Procedures : CPT4 Code: [id number] ( Description: Facility Use Only) 29581LT - APPLY MULTLAY COMPRS LWR LT LEG Modifier: Quantity: 1 Physician Procedures : CPT4 Code Description Modifier [id number] - WC PHYS LEVEL 3 - EST PT ICD-10 Diagnosis Description L97.822 Non-pressure chronic ulcer of other part of left lower leg with fat layer exposed I87.332 Chronic venous hypertension (idiopathic) with ulcer  and inflammation of left lower extremity Dunleavy, Dayrin [name] ([id number]) [id number] Page 6 Quantity: 1 of 6 Electronic Signature(s) Signed: 12/20/2022 5:08:25 PM By: Baltazar Najjar MD Entered By: Baltazar Najjar on 12/20/2022 13:42:12

## 2022-12-21 NOTE — Progress Notes (Signed)
AKEELAH, EINBINDER [name] ([id number]) [id number] Page 1 of 8 Visit Report for 12/20/2022 Arrival Information Details Patient Name: Date of Service: [name] [name]. 12/20/2022 3:45 PM Medical Record Number: [id number] Patient Account Number: [id number] Date of Birth/Sex: Treating RN: 1930-03-21 (89 y.o. F) Primary Care Azelea Seguin: Tracey Harries Other Clinician: Referring Detra Bores: Treating Eiman Maret/Extender: Davene Costain in Treatment: 3 Visit Information History Since Last Visit Added or deleted any medications: No Patient Arrived: Dan Humphreys Any new allergies or adverse reactions: No Arrival Time: 15:59 Had [name] fall or experienced change in No Accompanied By: son activities of daily living that may affect Transfer Assistance: None risk of falls: Patient Identification Verified: Yes Signs or symptoms of abuse/neglect since last visito No Secondary Verification Process Completed: Yes Hospitalized since last visit: No Patient Requires Transmission-Based Precautions: No Implantable device outside of the clinic excluding No Patient Has Alerts: Yes cellular tissue based products placed in the center Patient Alerts: Patient on Blood Thinner since last visit: Has Dressing in Place as Prescribed: Yes Has Compression in Place as Prescribed: Yes Pain Present Now: No Electronic Signature(s) Signed: 12/20/2022 4:53:59 PM By: Thayer Dallas Entered By: Thayer Dallas on 12/20/2022 16:00:15 -------------------------------------------------------------------------------- Compression Therapy Details Patient Name: Date of Service: [name]. 12/20/2022 3:45 PM Medical Record Number: [id number] Patient Account Number: [id number] Date of Birth/Sex: Treating RN: June 07, 1929 (89 y.o. [name] Primary Care Lithzy Bernard: Tracey Harries Other Clinician: Referring Kameron Blethen: Treating Jakerria Kingbird/Extender: Davene Costain in  Treatment: 3 Compression Therapy Performed for Wound Assessment: Wound #1 Left,Anterior Lower Leg Performed By: Clinician Shawn Stall, RN Compression Type: Double Layer Post Procedure Diagnosis Same as Pre-procedure Electronic Signature(s) Signed: 12/20/2022 6:19:00 PM By: Shawn Stall RN, BSN Entered By: Shawn Stall on 12/20/2022 16:32:07 [name], [name] ([id number]) [id number] Page 2 of 8 -------------------------------------------------------------------------------- Encounter Discharge Information Details Patient Name: Date of Service: [name]. 12/20/2022 3:45 PM Medical Record Number: [id number] Patient Account Number: [id number] Date of Birth/Sex: Treating RN: 05-11-1929 (89 y.o. [name] Primary Care Camreigh Michie: Tracey Harries Other Clinician: Referring Estoria Geary: Treating Yanitza Shvartsman/Extender: Davene Costain in Treatment: 3 Encounter Discharge Information Items Discharge Condition: Stable Ambulatory Status: Walker Discharge Destination: Home Transportation: Private Auto Accompanied By: son Schedule Follow-up Appointment: Yes Clinical Summary of Care: Electronic Signature(s) Signed: 12/20/2022 6:19:00 PM By: Shawn Stall RN, BSN Entered By: Shawn Stall on 12/20/2022 16:35:57 -------------------------------------------------------------------------------- Lower Extremity Assessment Details Patient Name: Date of Service: [name]. 12/20/2022 3:45 PM Medical Record Number: [id number] Patient Account Number: [id number] Date of Birth/Sex: Treating RN: May 07, 1929 (89 y.o. F) Primary Care Makila Colombe: Tracey Harries Other Clinician: Referring Elpidia Karn: Treating Nickolis Diel/Extender: Davene Costain in Treatment: 3 Edema Assessment Assessed: Kyra Searles: No] Franne Forts: No] [Left: Edema] [Right: :] Calf Left: Right: Point of Measurement: 32 cm From Medial Instep 27.9 cm Ankle Left:  Right: Point of Measurement: 9 cm From Medial Instep 14 cm Vascular Assessment Extremity colors, hair growth, and conditions: Extremity Color: [Left:Normal] Hair Growth on Extremity: [Left:No] Temperature of Extremity: [Left:Warm] Capillary Refill: [Left:< 3 seconds] Dependent Rubor: [Left:No No] Electronic Signature(s) Signed: 12/20/2022 4:53:59 PM By: Thayer Dallas Entered By: Thayer Dallas on 12/20/2022 16:07:39 [name], [name] ([id number]) [id number] Page 3 of 8 -------------------------------------------------------------------------------- Multi Wound Chart Details Patient Name: Date of Service: [name]. 12/20/2022 3:45 PM Medical Record Number: [id number] Patient Account Number: [id number] Date of Birth/Sex: Treating RN: 28-Sep-1929 (89  y.o. F) Primary Care Brittinee Risk: Tracey Harries Other Clinician: Referring Reve Crocket: Treating Zak Gondek/Extender: Davene Costain in Treatment: 3 Vital Signs Height(in): 61 Pulse(bpm): 76 Weight(lbs): 112 Blood Pressure(mmHg): 196/84 Body Mass Index(BMI): 21.2 Temperature(F): 97.9 Respiratory Rate(breaths/min): 18 [1:Photos:] [N/[name]:N/[name]] Left, Anterior Lower Leg N/[name] N/[name] Location: Trauma N/[name] N/[name] Wounding Event: Trauma, Other N/[name] N/[name] Primary Etiology: Cataracts, Anemia, Sleep Apnea, N/[name] N/[name] Comorbid History: Congestive Heart Failure, Coronary Artery Disease, Hypertension, Myocardial Infarction, Gout, Osteoarthritis, Neuropathy 08/29/2022 N/[name] N/[name] Date Acquired: 3 N/[name] N/[name] Weeks of Treatment: Open N/[name] N/[name] Status: No N/[name] N/[name] Recurrence: 3.2x0.5x0.1 N/[name] N/[name] Measurements L x W x D (cm) 1.257 N/[name] N/[name] [name] (cm) : rea 0.126 N/[name] N/[name] Volume (cm) : 89.30% N/[name] N/[name] % Reduction in Area: 89.30% N/[name] N/[name] % Reduction in Volume: Full Thickness Without Exposed N/[name] N/[name] Classification: Support Structures Medium N/[name] N/[name] Exudate Amount: Serosanguineous N/[name] N/[name] Exudate  Type: red, brown N/[name] N/[name] Exudate Color: Distinct, outline attached N/[name] N/[name] Margin: Large (67-100%) N/[name] N/[name] Granulation Amount: Red N/[name] N/[name] Granulation Quality: Small (1-33%) N/[name] N/[name] Necrotic Amount: Fat Layer (Subcutaneous Tissue): Yes N/[name] N/[name] Exposed Structures: Fascia: No Tendon: No Muscle: No Joint: No Bone: No Medium (34-66%) N/[name] N/[name] Epithelialization: Excoriation: No N/[name] N/[name] Periwound Skin Texture: Induration: No Callus: No Crepitus: No Rash: No Scarring: No Maceration: No N/[name] N/[name] Periwound Skin Moisture: Dry/Scaly: No Atrophie Blanche: No N/[name] N/[name] Periwound Skin Color: Cyanosis: No Ecchymosis: No Erythema: No Hemosiderin Staining: No Mottled: No Pallor: No Enderson, Debara [name] ([id number]) [id number] Page 4 of 8 Rubor: No No Abnormality N/[name] N/[name] Temperature: Compression Therapy N/[name] N/[name] Procedures Performed: Treatment Notes Wound #1 (Lower Leg) Wound Laterality: Left, Anterior Cleanser Soap and Water Discharge Instruction: May shower and wash wound with dial antibacterial soap and water prior to dressing change. Vashe 5.8 (oz) Discharge Instruction: Cleanse the wound with Vashe prior to applying [name] clean dressing using gauze sponges, not tissue or cotton balls. Peri-Wound Care Triamcinolone 15 (g) Discharge Instruction: Use triamcinolone 15 (g) as directed Sween Lotion (Moisturizing lotion) Discharge Instruction: Apply moisturizing lotion as directed Topical Primary Dressing Fibracol Plus Dressing, 4x4.38 in (collagen) Discharge Instruction: Moisten collagen with saline or hydrogel Xeroform Occlusive Gauze Dressing, 4x4 in Discharge Instruction: Apply over the collagen. Secondary Dressing Woven Gauze Sponge, Non-Sterile 4x4 in Discharge Instruction: Apply over primary dressing as directed. Secured With Compression Wrap Urgo K2 Lite, (equivalent to [name] 3 layer) two layer compression system, regular Discharge Instruction:  Apply Urgo K2 Lite as directed (alternative to 3 layer compression). Compression Stockings Add-Ons Electronic Signature(s) Signed: 12/20/2022 5:08:25 PM By: Baltazar Najjar MD Entered By: Baltazar Najjar on 12/20/2022 16:37:23 -------------------------------------------------------------------------------- Multi-Disciplinary Care Plan Details Patient Name: Date of Service: [name]. 12/20/2022 3:45 PM Medical Record Number: [id number] Patient Account Number: [id number] Date of Birth/Sex: Treating RN: [birth year] (89 y.o. [name] Primary Care Cipriana Biller: Tracey Harries Other Clinician: Referring Hideo Googe: Treating Kirbi Farrugia/Extender: Davene Costain in Treatment: 3 Active Inactive Wound/Skin Impairment Nursing Diagnoses: Knowledge deficit related to smoking impact on wound healing Goals: [name], [name] ([id number]) [phone].pdf Page 5 of 8 Patient/caregiver will verbalize understanding of skin care regimen Date Initiated: 11/29/2022 Target Resolution Date: 01/22/2023 Goal Status: Active Interventions: Assess patient/caregiver ability to obtain necessary supplies Assess patient/caregiver ability to perform ulcer/skin care regimen upon admission and as needed Assess ulceration(s) every visit Provide education on ulcer and skin care Screen for HBO Treatment Activities: Skin care regimen initiated :  11/29/2022 Topical wound management initiated : 11/29/2022 Notes: Electronic Signature(s) Signed: 12/20/2022 6:19:00 PM By: Shawn Stall RN, BSN Entered By: Shawn Stall on 12/20/2022 16:34:25 -------------------------------------------------------------------------------- Pain Assessment Details Patient Name: Date of Service: [name]. 12/20/2022 3:45 PM Medical Record Number: [id number] Patient Account Number: [id number] Date of Birth/Sex: Treating RN: [birth year] (89 y.o. F) Primary Care Demari Kropp: Tracey Harries  Other Clinician: Referring Arie Powell: Treating Luevenia Mcavoy/Extender: Davene Costain in Treatment: 3 Active Problems Location of Pain Severity and Description of Pain Patient Has Paino No Site Locations Pain Management and Medication Current Pain Management: Electronic Signature(s) Signed: 12/20/2022 4:53:59 PM By: Thayer Dallas Entered By: Thayer Dallas on 12/20/2022 16:00:38 [name], [name] ([id number]) [id number] Page 6 of 8 -------------------------------------------------------------------------------- Patient/Caregiver Education Details Patient Name: Date of Service: [name] 8/28/2024andnbsp3:45 PM Medical Record Number: [id number] Patient Account Number: [id number] Date of Birth/Gender: Treating RN: [birth year] (89 y.o. [name] Primary Care Physician: Tracey Harries Other Clinician: Referring Physician: Treating Physician/Extender: Davene Costain in Treatment: 3 Education Assessment Education Provided To: Patient and Caregiver Education Topics Provided Wound/Skin Impairment: Handouts: Caring for Your Ulcer Methods: Explain/Verbal Responses: Reinforcements needed Electronic Signature(s) Signed: 12/20/2022 6:19:00 PM By: Shawn Stall RN, BSN Entered By: Shawn Stall on 12/20/2022 16:35:10 -------------------------------------------------------------------------------- Wound Assessment Details Patient Name: Date of Service: [name]. 12/20/2022 3:45 PM Medical Record Number: [id number] Patient Account Number: [id number] Date of Birth/Sex: Treating RN: 11/03/1929 (89 y.o. F) Primary Care Shaneequa Bahner: Tracey Harries Other Clinician: Referring Naiah Donahoe: Treating Aleric Froelich/Extender: Davene Costain in Treatment: 3 Wound Status Wound Number: 1 Primary Trauma, Other Etiology: Wound Location: Left, Anterior Lower Leg Wound Open Wounding Event:  Trauma Status: Date Acquired: 08/29/2022 Comorbid Cataracts, Anemia, Sleep Apnea, Congestive Heart Failure, Weeks Of Treatment: 3 History: Coronary Artery Disease, Hypertension, Myocardial Infarction, Clustered Wound: No Gout, Osteoarthritis, Neuropathy Photos Wound Measurements Length: (cm) 3.2 [name], [name] ([id number]) Width: (cm) 0.5 Depth: (cm) 0.1 Area: (cm) 1.257 Volume: (cm) 0.126 % Reduction in Area: 89.3% [phone].pdf Page 7 of 8 % Reduction in Volume: 89.3% Epithelialization: Medium (34-66%) Tunneling: No Undermining: No Wound Description Classification: Full Thickness Without Exposed Support Structures Wound Margin: Distinct, outline attached Exudate Amount: Medium Exudate Type: Serosanguineous Exudate Color: red, brown Foul Odor After Cleansing: No Slough/Fibrino No Wound Bed Granulation Amount: Large (67-100%) Exposed Structure Granulation Quality: Red Fascia Exposed: No Necrotic Amount: Small (1-33%) Fat Layer (Subcutaneous Tissue) Exposed: Yes Necrotic Quality: Adherent Slough Tendon Exposed: No Muscle Exposed: No Joint Exposed: No Bone Exposed: No Periwound Skin Texture Texture Color No Abnormalities Noted: Yes No Abnormalities Noted: Yes Moisture Temperature / Pain No Abnormalities Noted: No Temperature: No Abnormality Dry / Scaly: No Maceration: No Treatment Notes Wound #1 (Lower Leg) Wound Laterality: Left, Anterior Cleanser Soap and Water Discharge Instruction: May shower and wash wound with dial antibacterial soap and water prior to dressing change. Vashe 5.8 (oz) Discharge Instruction: Cleanse the wound with Vashe prior to applying [name] clean dressing using gauze sponges, not tissue or cotton balls. Peri-Wound Care Triamcinolone 15 (g) Discharge Instruction: Use triamcinolone 15 (g) as directed Sween Lotion (Moisturizing lotion) Discharge Instruction: Apply moisturizing lotion as directed Topical Primary  Dressing Fibracol Plus Dressing, 4x4.38 in (collagen) Discharge Instruction: Moisten collagen with saline or hydrogel Xeroform Occlusive Gauze Dressing, 4x4 in Discharge Instruction: Apply over the collagen. Secondary Dressing Woven Gauze Sponge, Non-Sterile 4x4 in Discharge Instruction: Apply over primary dressing as directed. Secured  With Compression Wrap Urgo K2 Lite, (equivalent to [name] 3 layer) two layer compression system, regular Discharge Instruction: Apply Urgo K2 Lite as directed (alternative to 3 layer compression). Compression Stockings Add-Ons Electronic Signature(s) Signed: 12/20/2022 4:53:59 PM By: Thayer Dallas Entered By: Thayer Dallas on 12/20/2022 16:12:40 [name], [name] ([id number]) [id number] Page 8 of 8 -------------------------------------------------------------------------------- Vitals Details Patient Name: Date of Service: [name]. 12/20/2022 3:45 PM Medical Record Number: [id number] Patient Account Number: [id number] Date of Birth/Sex: Treating RN: 1929-12-12 (89 y.o. F) Primary Care Keeshia Sanderlin: Tracey Harries Other Clinician: Referring Shannan Garfinkel: Treating Reathel Turi/Extender: Davene Costain in Treatment: 3 Vital Signs Time Taken: 16:08 Temperature (F): 97.9 Height (in): 61 Pulse (bpm): 76 Weight (lbs): 112 Respiratory Rate (breaths/min): 18 Body Mass Index (BMI): 21.2 Blood Pressure (mmHg): 196/84 Reference Range: 80 - 120 mg / dl Electronic Signature(s) Signed: 12/20/2022 4:53:59 PM By: Thayer Dallas Entered By: Thayer Dallas on 12/20/2022 16:08:55

## 2022-12-27 ENCOUNTER — Encounter (HOSPITAL_BASED_OUTPATIENT_CLINIC_OR_DEPARTMENT_OTHER): Payer: Medicare Other | Attending: Physician Assistant | Admitting: Physician Assistant

## 2022-12-27 DIAGNOSIS — I872 Venous insufficiency (chronic) (peripheral): Secondary | ICD-10-CM | POA: Diagnosis not present

## 2022-12-27 DIAGNOSIS — N183 Chronic kidney disease, stage 3 unspecified: Secondary | ICD-10-CM | POA: Insufficient documentation

## 2022-12-27 DIAGNOSIS — L97822 Non-pressure chronic ulcer of other part of left lower leg with fat layer exposed: Secondary | ICD-10-CM | POA: Insufficient documentation

## 2022-12-27 DIAGNOSIS — I2510 Atherosclerotic heart disease of native coronary artery without angina pectoris: Secondary | ICD-10-CM | POA: Insufficient documentation

## 2022-12-27 DIAGNOSIS — I129 Hypertensive chronic kidney disease with stage 1 through stage 4 chronic kidney disease, or unspecified chronic kidney disease: Secondary | ICD-10-CM | POA: Insufficient documentation

## 2022-12-27 DIAGNOSIS — I87332 Chronic venous hypertension (idiopathic) with ulcer and inflammation of left lower extremity: Secondary | ICD-10-CM | POA: Insufficient documentation

## 2022-12-28 NOTE — Progress Notes (Signed)
Gwendolyn, COUSINEAU [name] ([id number]) [id number] Page 1 of 6 Visit Report for 12/27/2022 Chief Complaint Document Details Patient Name: Date of Service: [name]. 12/27/2022 3:00 PM Medical Record Number: [id number] Patient Account Number: [id number] Date of Birth/Sex: Treating RN: 17-Apr-1930 (89 y.o. F) Primary Care Provider: Tracey Harries Other Clinician: Referring Provider: Treating Provider/Extender: Vaughan Sine in Treatment: 4 Information Obtained from: Patient Chief Complaint Left LE Ulcer Electronic Signature(s) Signed: 12/27/2022 3:48:43 PM By: Allen Derry PA-C Entered By: Allen Derry on 12/27/2022 12:48:43 -------------------------------------------------------------------------------- HPI Details Patient Name: Date of Service: [name], [name]. 12/27/2022 3:00 PM Medical Record Number: [id number] Patient Account Number: [id number] Date of Birth/Sex: Treating RN: June 15, 1929 (89 y.o. F) Primary Care Provider: Tracey Harries Other Clinician: Referring Provider: Treating Provider/Extender: Vaughan Sine in Treatment: 4 History of Present Illness ssociated Signs and Symptoms: 11-29-2022 patient's ABI was 1.06 in the clinic today and appears to be sufficient in order to heal her wound. No further [name] vascular evaluation is necessitated at this point. HPI Description: 11-29-2022 upon evaluation today patient presents for evaluation here in the clinic secondary to issues that she has been having with [name] over the left anterior shin. This actually is [name] that occurred roughly 3 months ago when she struck this leg on [name] table. Subsequently initially there was [name] significant skin tear her caretakers that were with her today did show me [name] picture of what the wound looks like initially and compared to that this is [name] far cry and much improved compared to what was noted previous. With that being said I do see [name]  significant mount of slough and biofilm buildup I do believe she is going require some sharp debridement to clearway some of the necrotic debris. Right now they been leaving this open to air as [name] recommendation from [name] previous provider. Patient does have [name] history of chronic venous insufficiency, coronary artery disease, chronic kidney disease stage III, and hypertension. 12-06-2022 upon evaluation today patient appears to be doing well currently in regard to her wound. She has been tolerating the dressing changes without complication. Fortunately there does not appear to be any signs of active infection locally or systemically which is great news and in general I do believe that we are making headway towards complete closure which is good news. I do think however she may need [name] compression wrap to get this moving even better into the correct direction. 8/28; this patient had [name] traumatic wound on the left anterior lower leg in the setting of chronic venous insufficiency. She has been using collagen and Xeroform under and Urgo K2 lite wrap. She is doing well the wound is smaller 12-27-2022 upon evaluation today patient appears to be doing excellent in regard to her wound which is showing signs of great improvement. Fortunately I do not see any signs of worsening overall and I think that the patient is really making good progress towards complete closure. I do not see any evidence of infection which is great news she is almost completely healed. Electronic Signature(s) Signed: 12/27/2022 5:57:13 PM By: Allen Derry PA-C Entered By: Allen Derry on 12/27/2022 14:57:13 [name] ([id number]) [id number] Page 2 of 6 -------------------------------------------------------------------------------- Physical Exam Details Patient Name: Date of Service: [name] [name]. 12/27/2022 3:00 PM Medical Record Number: [id number] Patient Account Number: [id number] Date of  Birth/Sex: Treating RN: 1929/10/23 (89 y.o. F) Primary Care Provider: Everlene Other,  Onalee Hua Other Clinician: Referring Provider: Treating Provider/Extender: Vaughan Sine in Treatment: 4 Constitutional Well-nourished and well-hydrated in no acute distress. Respiratory normal breathing without difficulty. Psychiatric this patient is able to make decisions and demonstrates good insight into disease process. Alert and Oriented x 3. pleasant and cooperative. Notes Upon inspection patient's wound bed actually showed signs of good granulation and epithelization at this point. Fortunately I do not see any signs of worsening at this time and overall I do believe that the patient is making wonderful progress towards complete healing I am hopeful she will be completely healed by next week. Electronic Signature(s) Signed: 12/27/2022 5:57:31 PM By: Allen Derry PA-C Entered By: Allen Derry on 12/27/2022 14:57:31 -------------------------------------------------------------------------------- Physician Orders Details Patient Name: Date of Service: [name], [name]. 12/27/2022 3:00 PM Medical Record Number: [id number] Patient Account Number: [id number] Date of Birth/Sex: Treating RN: 1929/10/25 (89 y.o. [name] Primary Care Provider: Tracey Harries Other Clinician: Referring Provider: Treating Provider/Extender: Vaughan Sine in Treatment: 4 Verbal / Phone Orders: No Diagnosis Coding Follow-up Appointments ppointment in 1 week. [name] Wednesday 01/03/2023 3pm Return [name] ppointment in 2 weeks. [name] Wednesday 01/10/2023 3:00 PM - Wound Care Return [name] Anesthetic (In clinic) Topical Lidocaine 4% applied to wound bed Bathing/ Shower/ Hygiene May shower with protection but do not get wound dressing(s) wet. Protect dressing(s) with water repellant cover (for example, large plastic bag) or [name] cast cover and may then take shower. Edema Control - Lymphedema /  SCD / Other Bilateral Lower Extremities Elevate legs to the level of the heart or above for 30 minutes daily and/or when sitting for 3-4 times [name] day throughout the day. Avoid standing for long periods of time. Exercise regularly Wound Treatment Wound #1 - Lower Leg Wound Laterality: Left, Anterior Cleanser: Soap and Water 1 x Per Week/30 Days Discharge Instructions: May shower and wash wound with dial antibacterial soap and water prior to dressing change. ANWYN, PILA [name] ([id number]) [id number] Page 3 of 6 Cleanser: Vashe 5.8 (oz) 1 x Per Week/30 Days Discharge Instructions: Cleanse the wound with Vashe prior to applying [name] clean dressing using gauze sponges, not tissue or cotton balls. Peri-Wound Care: Triamcinolone 15 (g) 1 x Per Week/30 Days Discharge Instructions: Use triamcinolone 15 (g) as directed Peri-Wound Care: Sween Lotion (Moisturizing lotion) 1 x Per Week/30 Days Discharge Instructions: Apply moisturizing lotion as directed Prim Dressing: Fibracol Plus Dressing, 4x4.38 in (collagen) 1 x Per Week/30 Days ary Discharge Instructions: Moisten collagen with saline or hydrogel Prim Dressing: Xeroform Occlusive Gauze Dressing, 4x4 in 1 x Per Week/30 Days ary Discharge Instructions: Apply over the collagen. Secondary Dressing: Woven Gauze Sponge, Non-Sterile 4x4 in 1 x Per Week/30 Days Discharge Instructions: Apply over primary dressing as directed. Compression Wrap: Urgo K2 Lite, (equivalent to [name] 3 layer) two layer compression system, regular 1 x Per Week/30 Days Discharge Instructions: Apply Urgo K2 Lite as directed (alternative to 3 layer compression). Electronic Signature(s) Signed: 12/27/2022 6:09:26 PM By: Shawn Stall RN, BSN Signed: 12/27/2022 6:11:34 PM By: Allen Derry PA-C Entered By: Shawn Stall on 12/27/2022 12:37:23 -------------------------------------------------------------------------------- Problem List Details Patient Name: Date of  Service: [name], [name]. 12/27/2022 3:00 PM Medical Record Number: [id number] Patient Account Number: [id number] Date of Birth/Sex: Treating RN: 02/20/1930 (89 y.o. F) Primary Care Provider: Tracey Harries Other Clinician: Referring Provider: Treating Provider/Extender: Vaughan Sine in Treatment: 4 Active Problems ICD-10 Encounter  Code Description Active Date MDM Diagnosis I87.332 Chronic venous hypertension (idiopathic) with ulcer and inflammation of left 11/29/2022 No Yes lower extremity L97.822 Non-pressure chronic ulcer of other part of left lower leg with fat layer exposed8/10/2022 No Yes I25.10 Atherosclerotic heart disease of native coronary artery without angina pectoris 11/29/2022 No Yes N18.30 Chronic kidney disease, stage 3 unspecified 11/29/2022 No Yes I10 Essential (primary) hypertension 11/29/2022 No Yes Inactive Problems Resolved Problems Campanella, Paylin [name] ([id number]) [id number] Page 4 of 6 Electronic Signature(s) Signed: 12/27/2022 3:48:30 PM By: Allen Derry PA-C Entered By: Allen Derry on 12/27/2022 12:48:30 -------------------------------------------------------------------------------- Progress Note Details Patient Name: Date of Service: [name]. 12/27/2022 3:00 PM Medical Record Number: [id number] Patient Account Number: [id number] Date of Birth/Sex: Treating RN: October 22, 1929 (89 y.o. F) Primary Care Provider: Tracey Harries Other Clinician: Referring Provider: Treating Provider/Extender: Vaughan Sine in Treatment: 4 Subjective Chief Complaint Information obtained from Patient Left LE Ulcer History of Present Illness (HPI) The following HPI elements were documented for the patient's wound: Associated Signs and Symptoms: 11-29-2022 patient's ABI was 1.06 in the clinic today and appears to be sufficient in order to heal her wound. No further vascular evaluation is necessitated at  this point. 11-29-2022 upon evaluation today patient presents for evaluation here in the clinic secondary to issues that she has been having with [name] over the left anterior shin. This actually is [name] that occurred roughly 3 months ago when she struck this leg on [name] table. Subsequently initially there was [name] significant skin tear her caretakers that were with her today did show me [name] picture of what the wound looks like initially and compared to that this is [name] far cry and much improved compared to what was noted previous. With that being said I do see [name] significant mount of slough and biofilm buildup I do believe she is going require some sharp debridement to clearway some of the necrotic debris. Right now they been leaving this open to air as [name] recommendation from [name] previous provider. Patient does have [name] history of chronic venous insufficiency, coronary artery disease, chronic kidney disease stage III, and hypertension. 12-06-2022 upon evaluation today patient appears to be doing well currently in regard to her wound. She has been tolerating the dressing changes without complication. Fortunately there does not appear to be any signs of active infection locally or systemically which is great news and in general I do believe that we are making headway towards complete closure which is good news. I do think however she may need [name] compression wrap to get this moving even better into the correct direction. 8/28; this patient had [name] traumatic wound on the left anterior lower leg in the setting of chronic venous insufficiency. She has been using collagen and Xeroform under and Urgo K2 lite wrap. She is doing well the wound is smaller 12-27-2022 upon evaluation today patient appears to be doing excellent in regard to her wound which is showing signs of great improvement. Fortunately I do not see any signs of worsening overall and I think that the patient is really making good progress towards complete closure.  I do not see any evidence of infection which is great news she is almost completely healed. Objective Constitutional Well-nourished and well-hydrated in no acute distress. Vitals Time Taken: 3:08 PM, Height: 61 in, Weight: 112 lbs, BMI: 21.2, Temperature: 98.6 F, Pulse: 80 bpm, Respiratory Rate: 18 breaths/min, Blood Pressure: 183/82 mmHg. Respiratory normal breathing without  difficulty. Psychiatric this patient is able to make decisions and demonstrates good insight into disease process. Alert and Oriented x 3. pleasant and cooperative. General Notes: Upon inspection patient's wound bed actually showed signs of good granulation and epithelization at this point. Fortunately I do not see any signs of worsening at this time and overall I do believe that the patient is making wonderful progress towards complete healing I am hopeful she will be completely healed by next week. Integumentary (Hair, Skin) Wound #1 status is Open. Original cause of wound was Trauma. The date acquired was: 08/29/2022. The wound has been in treatment 4 weeks. The wound is [name], [name] ([id number]) [id number] Page 5 of 6 located on the Left,Anterior Lower Leg. The wound measures 0.9cm length x 0.4cm width x 0.1cm depth; 0.283cm^2 area and 0.028cm^3 volume. There is Fat Layer (Subcutaneous Tissue) exposed. There is no tunneling or undermining noted. There is [name] small amount of serosanguineous drainage noted. The wound margin is distinct with the outline attached to the wound base. There is large (67-100%) red granulation within the wound bed. There is no necrotic tissue within the wound bed. The periwound skin appearance had no abnormalities noted for texture. The periwound skin appearance had no abnormalities noted for color. The periwound skin appearance did not exhibit: Dry/Scaly, Maceration. Periwound temperature was noted as No Abnormality. Assessment Active Problems ICD-10 Chronic  venous hypertension (idiopathic) with ulcer and inflammation of left lower extremity Non-pressure chronic ulcer of other part of left lower leg with fat layer exposed Atherosclerotic heart disease of native coronary artery without angina pectoris Chronic kidney disease, stage 3 unspecified Essential (primary) hypertension Procedures Wound #1 Pre-procedure diagnosis of Wound #1 is [name] Trauma, Other located on the Left,Anterior Lower Leg . There was [name] Double Layer Compression Therapy Procedure by Shawn Stall, RN. Post procedure Diagnosis Wound #1: Same as Pre-Procedure Plan Follow-up Appointments: Return Appointment in 1 week. [name] Wednesday 01/03/2023 3pm Return Appointment in 2 weeks. [name] Wednesday 01/10/2023 3:00 PM - Wound Care Anesthetic: (In clinic) Topical Lidocaine 4% applied to wound bed Bathing/ Shower/ Hygiene: May shower with protection but do not get wound dressing(s) wet. Protect dressing(s) with water repellant cover (for example, large plastic bag) or [name] cast cover and may then take shower. Edema Control - Lymphedema / SCD / Other: Elevate legs to the level of the heart or above for 30 minutes daily and/or when sitting for 3-4 times [name] day throughout the day. Avoid standing for long periods of time. Exercise regularly WOUND #1: - Lower Leg Wound Laterality: Left, Anterior Cleanser: Soap and Water 1 x Per Week/30 Days Discharge Instructions: May shower and wash wound with dial antibacterial soap and water prior to dressing change. Cleanser: Vashe 5.8 (oz) 1 x Per Week/30 Days Discharge Instructions: Cleanse the wound with Vashe prior to applying [name] clean dressing using gauze sponges, not tissue or cotton balls. Peri-Wound Care: Triamcinolone 15 (g) 1 x Per Week/30 Days Discharge Instructions: Use triamcinolone 15 (g) as directed Peri-Wound Care: Sween Lotion (Moisturizing lotion) 1 x Per Week/30 Days Discharge Instructions: Apply moisturizing lotion as directed Prim  Dressing: Fibracol Plus Dressing, 4x4.38 in (collagen) 1 x Per Week/30 Days ary Discharge Instructions: Moisten collagen with saline or hydrogel Prim Dressing: Xeroform Occlusive Gauze Dressing, 4x4 in 1 x Per Week/30 Days ary Discharge Instructions: Apply over the collagen. Secondary Dressing: Woven Gauze Sponge, Non-Sterile 4x4 in 1 x Per Week/30 Days Discharge Instructions: Apply over primary dressing as directed. Com  pression Wrap: Urgo K2 Lite, (equivalent to [name] 3 layer) two layer compression system, regular 1 x Per Week/30 Days Discharge Instructions: Apply Urgo K2 Lite as directed (alternative to 3 layer compression). 1. I would recommend that we have the patient continue with the collagen followed by the Xeroform gauze to cover which seems to be doing extremely well. 2. I am going to recommend as well that the patient continue to monitor for any signs of infection or worsening. Based on what I seeing currently I do believe that she is really doing quite well with regard to the plan and I both which will be healed by next week. We will see patient back for reevaluation in 1 week here in the clinic. If anything worsens or changes patient will contact our office for additional recommendations. Electronic Signature(s) Signed: 12/27/2022 5:57:52 PM By: Allen Derry PA-C Entered By: Allen Derry on 12/27/2022 14:57:52 [name], [name] ([id number]) [id number] Page 6 of 6 -------------------------------------------------------------------------------- SuperBill Details Patient Name: Date of Service: [name] [facility] [name]. 12/27/2022 Medical Record Number: [id number] Patient Account Number: [id number] Date of Birth/Sex: Treating RN: 06/02/1929 (89 y.o. [name], Millard.Loa Primary Care Provider: Tracey Harries Other Clinician: Referring Provider: Treating Provider/Extender: Vaughan Sine in Treatment: 4 Diagnosis Coding ICD-10 Codes Code  Description [phone] Chronic venous hypertension (idiopathic) with ulcer and inflammation of left lower extremity L97.822 Non-pressure chronic ulcer of other part of left lower leg with fat layer exposed I25.10 Atherosclerotic heart disease of native coronary artery without angina pectoris N18.30 Chronic kidney disease, stage 3 unspecified I10 Essential (primary) hypertension Facility Procedures : CPT4 Code: [id number] Description: (Facility Use Only) 29581LT - APPLY MULTLAY COMPRS LWR LT LEG Modifier: Quantity: 1 Physician Procedures : CPT4 Code Description Modifier [id number] - WC PHYS LEVEL 3 - EST PT ICD-10 Diagnosis Description I87.332 Chronic venous hypertension (idiopathic) with ulcer and inflammation of left lower extremity L97.822 Non-pressure chronic ulcer of other part  of left lower leg with fat layer exposed I25.10 Atherosclerotic heart disease of native coronary artery without angina pectoris N18.30 Chronic kidney disease, stage 3 unspecified Quantity: 1 Electronic Signature(s) Signed: 12/27/2022 5:58:12 PM By: Allen Derry PA-C Entered By: Allen Derry on 12/27/2022 14:58:12

## 2022-12-29 NOTE — Progress Notes (Addendum)
Bobbi on 12/27/2022 12:04:42 -------------------------------------------------------------------------------- Multi-Disciplinary Care Plan Details Patient Name: Date of Service: [name] [name]. 12/27/2022 3:00 PM Medical Record Number: [id number] Patient Account Number: [id number] Date of Birth/Sex: Treating RN: [birth year] (89 y.o. [name] Primary Care Chaselyn Nanney: Tracey Harries Other Clinician: Referring Risha Barretta: Treating Shanta Hartner/Extender: Vaughan Sine in Treatment: 4 Active Inactive Electronic Signature(s) Signed: 01/09/2023 2:05:19 PM By: Shawn Stall RN, BSN Previous Signature: 12/27/2022 6:09:26 PM Version By: Shawn Stall RN, BSN Entered By: Shawn Stall on 01/09/2023 11:05:19 -------------------------------------------------------------------------------- Pain Assessment Details Patient Name: Date of Service: [name], [name]. 12/27/2022 3:00 PM Medical Record Number: [id number] Patient Account Number: [id number] Date of Birth/Sex: Treating RN: 05-31-1929 (89 y.o. [name] Primary Care Jamilet Ambroise: Tracey Harries Other Clinician: Referring Laurren Lepkowski: Treating Romanita Fager/Extender: Vaughan Sine in Treatment: 4 Active  Problems Location of Pain Severity and Description of Pain Patient Has Paino No Site Locations Pain Management and Medication Current Pain Management: CHARIZMA, LINKO [name] ([id number]) [id number] Page 4 of 6 Electronic Signature(s) Signed: 12/27/2022 6:09:26 PM By: Shawn Stall RN, BSN Entered By: Shawn Stall on 12/27/2022 12:04:21 -------------------------------------------------------------------------------- Patient/Caregiver Education Details Patient Name: Date of Service: [name] 9/4/2024andnbsp3:00 PM Medical Record Number: [id number] Patient Account Number: [id number] Date of Birth/Gender: Treating RN: January 19, 1930 (89 y.o. [name] Primary Care Physician: Tracey Harries Other Clinician: Referring Physician: Treating Physician/Extender: Vaughan Sine in Treatment: 4 Education Assessment Education Provided To: Patient and Caregiver Education Topics Provided Wound/Skin Impairment: Handouts: Caring for Your Ulcer Methods: Explain/Verbal Responses: Reinforcements needed Electronic Signature(s) Signed: 12/27/2022 6:09:26 PM By: Shawn Stall RN, BSN Entered By: Shawn Stall on 12/27/2022 12:36:28 -------------------------------------------------------------------------------- Wound Assessment Details Patient Name: Date of Service: [name], [name]. 12/27/2022 3:00 PM Medical Record Number: [id number] Patient Account Number: [id number] Date of Birth/Sex: Treating RN: [birth year] (89 y.o. [name] Primary Care [name]: Tracey Harries Other Clinician: Referring Marshal Eskew: Treating Breeona Waid/Extender: Vaughan Sine in Treatment: 4 Wound Status Wound Number: 1 Primary Trauma, Other Etiology: Wound Location: Left, Anterior Lower Leg Wound Open Wounding Event: Trauma Status: Date Acquired: 08/29/2022 Comorbid Cataracts, Anemia, Sleep Apnea, Congestive Heart Failure, Weeks Of  Treatment: 4 History: Coronary Artery Disease, Hypertension, Myocardial Infarction, Clustered Wound: No Gout, Osteoarthritis, Neuropathy Photos Popko, Kresha [name] ([id number]) [id number] Page 5 of 6 Wound Measurements Length: (cm) 0.9 Width: (cm) 0.4 Depth: (cm) 0.1 Area: (cm) 0.283 Volume: (cm) 0.028 % Reduction in Area: 97.6% % Reduction in Volume: 97.6% Epithelialization: Large (67-100%) Tunneling: No Undermining: No Wound Description Classification: Full Thickness Without Exposed Suppor Wound Margin: Distinct, outline attached Exudate Amount: Small Exudate Type: Serosanguineous Exudate Color: red, brown t Structures Foul Odor After Cleansing: No Slough/Fibrino No Wound Bed Granulation Amount: Large (67-100%) Exposed Structure Granulation Quality: Red Fascia Exposed: No Necrotic Amount: None Present (0%) Fat Layer (Subcutaneous Tissue) Exposed: Yes Tendon Exposed: No Muscle Exposed: No Joint Exposed: No Bone Exposed: No Periwound Skin Texture Texture Color No Abnormalities Noted: Yes No Abnormalities Noted: Yes Moisture Temperature / Pain No Abnormalities Noted: No Temperature: No Abnormality Dry / Scaly: No Maceration: No Electronic Signature(s) Signed: 12/27/2022 6:09:26 PM By: Shawn Stall RN, BSN Signed: 12/29/2022 10:06:16 AM By: Karl Ito Entered By: Karl Ito on 12/27/2022 12:07:43 -------------------------------------------------------------------------------- Vitals Details Patient Name: Date of Service: [name], [name]. 12/27/2022 3:00 PM Medical Record Number: [id number] Patient Account Number: [id number] Date of  Birth/Sex: Treating RN: 05-11-1929 (89 y.o. F) Primary Care Dekota Shenk: Tracey Harries Other Clinician: Referring Eriel Dunckel: Treating Bueford Arp/Extender: Vaughan Sine in Treatment: 4 Vital Signs Time Taken: 15:08 Temperature (F): 98.6 Height (in): 61 Pulse (bpm): 80 Weight  (lbs): 112 Respiratory Rate (breaths/min): 18 Body Mass Index (BMI): 21.2 Blood Pressure (mmHg): 183/82 Reference Range: 80 - 120 mg / dl [name] ([id number]) [id number] Page 6 of 6 Electronic Signature(s) Signed: 12/29/2022 10:06:16 AM By: Karl Ito Entered By: Karl Ito on 12/27/2022 12:08:12  Bobbi on 12/27/2022 12:04:42 -------------------------------------------------------------------------------- Multi-Disciplinary Care Plan Details Patient Name: Date of Service: [name] [name]. 12/27/2022 3:00 PM Medical Record Number: [id number] Patient Account Number: [id number] Date of Birth/Sex: Treating RN: [birth year] (89 y.o. [name] Primary Care Chaselyn Nanney: Tracey Harries Other Clinician: Referring Risha Barretta: Treating Shanta Hartner/Extender: Vaughan Sine in Treatment: 4 Active Inactive Electronic Signature(s) Signed: 01/09/2023 2:05:19 PM By: Shawn Stall RN, BSN Previous Signature: 12/27/2022 6:09:26 PM Version By: Shawn Stall RN, BSN Entered By: Shawn Stall on 01/09/2023 11:05:19 -------------------------------------------------------------------------------- Pain Assessment Details Patient Name: Date of Service: [name], [name]. 12/27/2022 3:00 PM Medical Record Number: [id number] Patient Account Number: [id number] Date of Birth/Sex: Treating RN: 05-31-1929 (89 y.o. [name] Primary Care Jamilet Ambroise: Tracey Harries Other Clinician: Referring Laurren Lepkowski: Treating Romanita Fager/Extender: Vaughan Sine in Treatment: 4 Active  Problems Location of Pain Severity and Description of Pain Patient Has Paino No Site Locations Pain Management and Medication Current Pain Management: CHARIZMA, LINKO [name] ([id number]) [id number] Page 4 of 6 Electronic Signature(s) Signed: 12/27/2022 6:09:26 PM By: Shawn Stall RN, BSN Entered By: Shawn Stall on 12/27/2022 12:04:21 -------------------------------------------------------------------------------- Patient/Caregiver Education Details Patient Name: Date of Service: [name] 9/4/2024andnbsp3:00 PM Medical Record Number: [id number] Patient Account Number: [id number] Date of Birth/Gender: Treating RN: January 19, 1930 (89 y.o. [name] Primary Care Physician: Tracey Harries Other Clinician: Referring Physician: Treating Physician/Extender: Vaughan Sine in Treatment: 4 Education Assessment Education Provided To: Patient and Caregiver Education Topics Provided Wound/Skin Impairment: Handouts: Caring for Your Ulcer Methods: Explain/Verbal Responses: Reinforcements needed Electronic Signature(s) Signed: 12/27/2022 6:09:26 PM By: Shawn Stall RN, BSN Entered By: Shawn Stall on 12/27/2022 12:36:28 -------------------------------------------------------------------------------- Wound Assessment Details Patient Name: Date of Service: [name], [name]. 12/27/2022 3:00 PM Medical Record Number: [id number] Patient Account Number: [id number] Date of Birth/Sex: Treating RN: [birth year] (89 y.o. [name] Primary Care [name]: Tracey Harries Other Clinician: Referring Marshal Eskew: Treating Breeona Waid/Extender: Vaughan Sine in Treatment: 4 Wound Status Wound Number: 1 Primary Trauma, Other Etiology: Wound Location: Left, Anterior Lower Leg Wound Open Wounding Event: Trauma Status: Date Acquired: 08/29/2022 Comorbid Cataracts, Anemia, Sleep Apnea, Congestive Heart Failure, Weeks Of  Treatment: 4 History: Coronary Artery Disease, Hypertension, Myocardial Infarction, Clustered Wound: No Gout, Osteoarthritis, Neuropathy Photos Popko, Kresha [name] ([id number]) [id number] Page 5 of 6 Wound Measurements Length: (cm) 0.9 Width: (cm) 0.4 Depth: (cm) 0.1 Area: (cm) 0.283 Volume: (cm) 0.028 % Reduction in Area: 97.6% % Reduction in Volume: 97.6% Epithelialization: Large (67-100%) Tunneling: No Undermining: No Wound Description Classification: Full Thickness Without Exposed Suppor Wound Margin: Distinct, outline attached Exudate Amount: Small Exudate Type: Serosanguineous Exudate Color: red, brown t Structures Foul Odor After Cleansing: No Slough/Fibrino No Wound Bed Granulation Amount: Large (67-100%) Exposed Structure Granulation Quality: Red Fascia Exposed: No Necrotic Amount: None Present (0%) Fat Layer (Subcutaneous Tissue) Exposed: Yes Tendon Exposed: No Muscle Exposed: No Joint Exposed: No Bone Exposed: No Periwound Skin Texture Texture Color No Abnormalities Noted: Yes No Abnormalities Noted: Yes Moisture Temperature / Pain No Abnormalities Noted: No Temperature: No Abnormality Dry / Scaly: No Maceration: No Electronic Signature(s) Signed: 12/27/2022 6:09:26 PM By: Shawn Stall RN, BSN Signed: 12/29/2022 10:06:16 AM By: Karl Ito Entered By: Karl Ito on 12/27/2022 12:07:43 -------------------------------------------------------------------------------- Vitals Details Patient Name: Date of Service: [name], [name]. 12/27/2022 3:00 PM Medical Record Number: [id number] Patient Account Number: [id number] Date of

## 2022-12-31 ENCOUNTER — Encounter (HOSPITAL_COMMUNITY): Payer: Self-pay

## 2022-12-31 ENCOUNTER — Other Ambulatory Visit: Payer: Self-pay

## 2022-12-31 ENCOUNTER — Inpatient Hospital Stay (HOSPITAL_COMMUNITY)
Admission: EM | Admit: 2022-12-31 | Discharge: 2023-01-05 | DRG: 871 | Disposition: A | Discharge status: Hospice | Payer: Medicare Other | Attending: Family Medicine | Admitting: Family Medicine

## 2022-12-31 ENCOUNTER — Emergency Department (HOSPITAL_COMMUNITY): Payer: Medicare Other

## 2022-12-31 DIAGNOSIS — M1990 Unspecified osteoarthritis, unspecified site: Secondary | ICD-10-CM | POA: Diagnosis present

## 2022-12-31 DIAGNOSIS — N183 Chronic kidney disease, stage 3 unspecified: Secondary | ICD-10-CM | POA: Diagnosis present

## 2022-12-31 DIAGNOSIS — Z9109 Other allergy status, other than to drugs and biological substances: Secondary | ICD-10-CM

## 2022-12-31 DIAGNOSIS — Z1152 Encounter for screening for COVID-19: Secondary | ICD-10-CM

## 2022-12-31 DIAGNOSIS — E871 Hypo-osmolality and hyponatremia: Secondary | ICD-10-CM | POA: Diagnosis present

## 2022-12-31 DIAGNOSIS — I4892 Unspecified atrial flutter: Secondary | ICD-10-CM | POA: Diagnosis present

## 2022-12-31 DIAGNOSIS — I5020 Unspecified systolic (congestive) heart failure: Secondary | ICD-10-CM

## 2022-12-31 DIAGNOSIS — E872 Acidosis, unspecified: Secondary | ICD-10-CM | POA: Diagnosis present

## 2022-12-31 DIAGNOSIS — G4733 Obstructive sleep apnea (adult) (pediatric): Secondary | ICD-10-CM | POA: Diagnosis present

## 2022-12-31 DIAGNOSIS — G039 Meningitis, unspecified: Secondary | ICD-10-CM | POA: Diagnosis present

## 2022-12-31 DIAGNOSIS — R627 Adult failure to thrive: Secondary | ICD-10-CM | POA: Diagnosis present

## 2022-12-31 DIAGNOSIS — E860 Dehydration: Secondary | ICD-10-CM | POA: Diagnosis present

## 2022-12-31 DIAGNOSIS — A419 Sepsis, unspecified organism: Secondary | ICD-10-CM | POA: Diagnosis not present

## 2022-12-31 DIAGNOSIS — Z79899 Other long term (current) drug therapy: Secondary | ICD-10-CM

## 2022-12-31 DIAGNOSIS — R509 Fever, unspecified: Principal | ICD-10-CM

## 2022-12-31 DIAGNOSIS — R4701 Aphasia: Secondary | ICD-10-CM | POA: Diagnosis present

## 2022-12-31 DIAGNOSIS — M4642 Discitis, unspecified, cervical region: Secondary | ICD-10-CM | POA: Diagnosis present

## 2022-12-31 DIAGNOSIS — G4731 Primary central sleep apnea: Secondary | ICD-10-CM

## 2022-12-31 DIAGNOSIS — Z8701 Personal history of pneumonia (recurrent): Secondary | ICD-10-CM

## 2022-12-31 DIAGNOSIS — I5030 Unspecified diastolic (congestive) heart failure: Secondary | ICD-10-CM | POA: Diagnosis present

## 2022-12-31 DIAGNOSIS — K580 Irritable bowel syndrome with diarrhea: Secondary | ICD-10-CM | POA: Diagnosis present

## 2022-12-31 DIAGNOSIS — I130 Hypertensive heart and chronic kidney disease with heart failure and stage 1 through stage 4 chronic kidney disease, or unspecified chronic kidney disease: Secondary | ICD-10-CM | POA: Diagnosis present

## 2022-12-31 DIAGNOSIS — Z885 Allergy status to narcotic agent status: Secondary | ICD-10-CM

## 2022-12-31 DIAGNOSIS — E876 Hypokalemia: Secondary | ICD-10-CM | POA: Diagnosis present

## 2022-12-31 DIAGNOSIS — I161 Hypertensive emergency: Secondary | ICD-10-CM | POA: Diagnosis present

## 2022-12-31 DIAGNOSIS — Z91040 Latex allergy status: Secondary | ICD-10-CM

## 2022-12-31 DIAGNOSIS — R6520 Severe sepsis without septic shock: Secondary | ICD-10-CM | POA: Diagnosis present

## 2022-12-31 DIAGNOSIS — E785 Hyperlipidemia, unspecified: Secondary | ICD-10-CM | POA: Diagnosis present

## 2022-12-31 DIAGNOSIS — Z7982 Long term (current) use of aspirin: Secondary | ICD-10-CM

## 2022-12-31 DIAGNOSIS — I10 Essential (primary) hypertension: Secondary | ICD-10-CM | POA: Diagnosis present

## 2022-12-31 DIAGNOSIS — I255 Ischemic cardiomyopathy: Secondary | ICD-10-CM | POA: Diagnosis present

## 2022-12-31 DIAGNOSIS — Z9181 History of falling: Secondary | ICD-10-CM

## 2022-12-31 DIAGNOSIS — Z66 Do not resuscitate: Secondary | ICD-10-CM | POA: Diagnosis present

## 2022-12-31 DIAGNOSIS — Z96651 Presence of right artificial knee joint: Secondary | ICD-10-CM | POA: Diagnosis present

## 2022-12-31 DIAGNOSIS — I5032 Chronic diastolic (congestive) heart failure: Secondary | ICD-10-CM | POA: Diagnosis present

## 2022-12-31 DIAGNOSIS — G40909 Epilepsy, unspecified, not intractable, without status epilepticus: Secondary | ICD-10-CM | POA: Diagnosis present

## 2022-12-31 DIAGNOSIS — Z8781 Personal history of (healed) traumatic fracture: Secondary | ICD-10-CM

## 2022-12-31 DIAGNOSIS — Z8249 Family history of ischemic heart disease and other diseases of the circulatory system: Secondary | ICD-10-CM

## 2022-12-31 DIAGNOSIS — R54 Age-related physical debility: Secondary | ICD-10-CM | POA: Diagnosis present

## 2022-12-31 DIAGNOSIS — R443 Hallucinations, unspecified: Secondary | ICD-10-CM | POA: Diagnosis not present

## 2022-12-31 DIAGNOSIS — M4622 Osteomyelitis of vertebra, cervical region: Secondary | ICD-10-CM | POA: Diagnosis present

## 2022-12-31 DIAGNOSIS — I252 Old myocardial infarction: Secondary | ICD-10-CM

## 2022-12-31 DIAGNOSIS — G9341 Metabolic encephalopathy: Secondary | ICD-10-CM | POA: Diagnosis present

## 2022-12-31 DIAGNOSIS — R488 Other symbolic dysfunctions: Secondary | ICD-10-CM | POA: Diagnosis present

## 2022-12-31 DIAGNOSIS — Z515 Encounter for palliative care: Secondary | ICD-10-CM

## 2022-12-31 DIAGNOSIS — I4891 Unspecified atrial fibrillation: Secondary | ICD-10-CM

## 2022-12-31 DIAGNOSIS — Z90710 Acquired absence of both cervix and uterus: Secondary | ICD-10-CM

## 2022-12-31 DIAGNOSIS — R6510 Systemic inflammatory response syndrome (SIRS) of non-infectious origin without acute organ dysfunction: Secondary | ICD-10-CM

## 2022-12-31 DIAGNOSIS — Z85828 Personal history of other malignant neoplasm of skin: Secondary | ICD-10-CM

## 2022-12-31 DIAGNOSIS — I2510 Atherosclerotic heart disease of native coronary artery without angina pectoris: Secondary | ICD-10-CM | POA: Diagnosis present

## 2022-12-31 LAB — CBC WITH DIFFERENTIAL/PLATELET
Abs Immature Granulocytes: 0.04 10*3/uL (ref 0.00–0.07)
Basophils Absolute: 0 10*3/uL (ref 0.0–0.1)
Basophils Relative: 0 %
Eosinophils Absolute: 0 10*3/uL (ref 0.0–0.5)
Eosinophils Relative: 0 %
HCT: 40.3 % (ref 36.0–46.0)
Hemoglobin: 12.9 g/dL (ref 12.0–15.0)
Immature Granulocytes: 0 %
Lymphocytes Relative: 11 %
Lymphs Abs: 1 10*3/uL (ref 0.7–4.0)
MCH: 29.3 pg (ref 26.0–34.0)
MCHC: 32 g/dL (ref 30.0–36.0)
MCV: 91.4 fL (ref 80.0–100.0)
Monocytes Absolute: 0.6 10*3/uL (ref 0.1–1.0)
Monocytes Relative: 6 %
Neutro Abs: 7.6 10*3/uL (ref 1.7–7.7)
Neutrophils Relative %: 83 %
Platelets: 165 10*3/uL (ref 150–400)
RBC: 4.41 MIL/uL (ref 3.87–5.11)
RDW: 15.6 % — ABNORMAL HIGH (ref 11.5–15.5)
WBC: 9.2 10*3/uL (ref 4.0–10.5)
nRBC: 0 % (ref 0.0–0.2)

## 2022-12-31 LAB — URINALYSIS, W/ REFLEX TO CULTURE (INFECTION SUSPECTED)
Bilirubin Urine: NEGATIVE
Glucose, UA: NEGATIVE mg/dL
Ketones, ur: 5 mg/dL — AB
Leukocytes,Ua: NEGATIVE
Nitrite: NEGATIVE
Protein, ur: 30 mg/dL — AB
Specific Gravity, Urine: 1.006 (ref 1.005–1.030)
pH: 8 (ref 5.0–8.0)

## 2022-12-31 LAB — COMPREHENSIVE METABOLIC PANEL
ALT: 19 U/L (ref 0–44)
AST: 32 U/L (ref 15–41)
Albumin: 4.6 g/dL (ref 3.5–5.0)
Alkaline Phosphatase: 95 U/L (ref 38–126)
Anion gap: 12 (ref 5–15)
BUN: 14 mg/dL (ref 8–23)
CO2: 25 mmol/L (ref 22–32)
Calcium: 9.5 mg/dL (ref 8.9–10.3)
Chloride: 97 mmol/L — ABNORMAL LOW (ref 98–111)
Creatinine, Ser: 0.6 mg/dL (ref 0.44–1.00)
GFR, Estimated: >60 mL/min (ref >60)
Glucose, Bld: 118 mg/dL — ABNORMAL HIGH (ref 70–99)
Potassium: 3.5 mmol/L (ref 3.5–5.1)
Sodium: 134 mmol/L — ABNORMAL LOW (ref 135–145)
Total Bilirubin: 1.3 mg/dL — ABNORMAL HIGH (ref 0.3–1.2)
Total Protein: 8.3 g/dL — ABNORMAL HIGH (ref 6.5–8.1)

## 2022-12-31 LAB — TROPONIN I (HIGH SENSITIVITY): Troponin I (High Sensitivity): 33 ng/L — ABNORMAL HIGH (ref <18)

## 2022-12-31 LAB — I-STAT CG4 LACTIC ACID, ED
Lactic Acid, Venous: 1.9 mmol/L (ref 0.5–1.9)
Lactic Acid, Venous: 2.3 mmol/L (ref 0.5–1.9)

## 2022-12-31 LAB — PROTIME-INR
INR: 1.1 (ref 0.8–1.2)
Prothrombin Time: 14.4 s (ref 11.4–15.2)

## 2022-12-31 LAB — BRAIN NATRIURETIC PEPTIDE: B Natriuretic Peptide: 496.1 pg/mL — ABNORMAL HIGH (ref 0.0–100.0)

## 2022-12-31 MED ORDER — ACETAMINOPHEN 650 MG RE SUPP
650.0000 mg | Freq: Once | RECTAL | Status: AC
  Administered 2022-12-31: 650 mg via RECTAL
  Filled 2022-12-31: qty 1

## 2022-12-31 MED ORDER — SODIUM CHLORIDE 0.9 % IV SOLN
2.0000 g | Freq: Once | INTRAVENOUS | Status: AC
  Administered 2022-12-31: 2 g via INTRAVENOUS
  Filled 2022-12-31: qty 20

## 2022-12-31 MED ORDER — SODIUM CHLORIDE 0.9 % IV BOLUS
1000.0000 mL | Freq: Once | INTRAVENOUS | Status: AC
  Administered 2022-12-31: 1000 mL via INTRAVENOUS

## 2022-12-31 MED ORDER — METOPROLOL TARTRATE 5 MG/5ML IV SOLN
2.5000 mg | Freq: Once | INTRAVENOUS | Status: AC
  Administered 2022-12-31: 2.5 mg via INTRAVENOUS

## 2022-12-31 MED ORDER — METOPROLOL TARTRATE 5 MG/5ML IV SOLN
2.5000 mg | Freq: Once | INTRAVENOUS | Status: AC
  Administered 2022-12-31: 2.5 mg via INTRAVENOUS
  Filled 2022-12-31: qty 5

## 2022-12-31 NOTE — ED Triage Notes (Signed)
Pt. BIB [facility] for new onset weakness and confusion. Pt. Was using the bathroom and was unable to get herself. She is usually A&Ox4 but today she is only able to answer yes or no questions per EMS. Pt. Has also been c/o dysuria to family. HR 140 with EMS, BP 205/111. Given 1 L of LR with EMS

## 2022-12-31 NOTE — ED Provider Notes (Signed)
[place] AT [facility] Provider Note   CSN: [id number] Arrival date & time: 12/31/22  2010     History {Add pertinent medical, surgical, social history, OB history to HPI:1} Chief Complaint  Patient presents with   Weakness    [name] is a 89 y.o. female.  Patient with weakness and dysuria and fever.  Patient has a history of congestive heart failure and coronary artery disease with atrial fibs   Weakness      Home Medications Prior to Admission medications   Medication Sig Start Date End Date Taking? Authorizing Provider  aspirin EC 81 MG tablet Take 81 mg by mouth daily. Swallow whole.    [provider]  diphenoxylate-atropine (LOMOTIL) 2.5-0.025 MG tablet Take 1 tablet by mouth 4 (four) times daily as needed for diarrhea or loose stools. 01/15/20   Medina-Vargas, Monina C, NP  ezetimibe (ZETIA) 10 MG tablet Take 1 tablet (10 mg total) by mouth daily. 09/15/20   Patwardhan, Anabel Bene, MD  ferrous sulfate 325 (65 FE) MG EC tablet Take 1 tablet (325 mg total) by mouth daily with breakfast. 03/22/21 12/31/22  [place], Alvira Philips, DO  gabapentin (NEURONTIN) 100 MG capsule Take 100 mg by mouth at bedtime. 01/25/22   [provider]  levETIRAcetam (KEPPRA) 500 MG tablet Take 1 tablet (500 mg total) by mouth 2 (two) times daily. Patient taking differently: Take 500 mg by mouth at bedtime. 01/15/20   Medina-Vargas, Monina C, NP  nitroGLYCERIN (NITROSTAT) 0.4 MG SL tablet Place 1 tablet (0.4 mg total) under the tongue every 5 (five) minutes as needed for chest pain. 01/15/20   Medina-Vargas, Monina C, NP  oxybutynin (DITROPAN-XL) 10 MG 24 hr tablet Take 10 mg by mouth at bedtime. 03/12/20   [provider]      Allergies    Codeine, Latex, Tape, and Wound dressing adhesive    Review of Systems   Review of Systems  Neurological:  Positive for weakness.    Physical Exam Updated Vital Signs BP (!) 161/107 (BP Location:  Right Arm)   Pulse (!) 121   Temp (!) 101.5 F (38.6 C) (Rectal)   Resp (!) 23   Ht 4\' 11"  (1.499 m)   Wt 57 kg   LMP  (LMP Unknown)   SpO2 100%   BMI 25.38 kg/m  Physical Exam  ED Results / Procedures / Treatments   Labs (all labs ordered are listed, but only abnormal results are displayed) Labs Reviewed  COMPREHENSIVE METABOLIC PANEL - Abnormal; Notable for the following components:      Result Value   Sodium 134 (*)    Chloride 97 (*)    Glucose, Bld 118 (*)    Total Protein 8.3 (*)    Total Bilirubin 1.3 (*)    All other components within normal limits  CBC WITH DIFFERENTIAL/PLATELET - Abnormal; Notable for the following components:   RDW 15.6 (*)    All other components within normal limits  URINALYSIS, W/ REFLEX TO CULTURE (INFECTION SUSPECTED) - Abnormal; Notable for the following components:   Color, Urine STRAW (*)    Hgb urine dipstick MODERATE (*)    Ketones, ur 5 (*)    Protein, ur 30 (*)    Bacteria, UA RARE (*)    All other components within normal limits  BRAIN NATRIURETIC PEPTIDE - Abnormal; Notable for the following components:   B Natriuretic Peptide 496.1 (*)    All other components within normal  limits  I-STAT CG4 LACTIC ACID, ED - Abnormal; Notable for the following components:   Lactic Acid, Venous 2.3 (*)    All other components within normal limits  TROPONIN I (HIGH SENSITIVITY) - Abnormal; Notable for the following components:   Troponin I (High Sensitivity) 33 (*)    All other components within normal limits  CULTURE, BLOOD (ROUTINE X 2)  CULTURE, BLOOD (ROUTINE X 2)  RESP PANEL BY RT-PCR (RSV, FLU A&B, COVID)  RVPGX2  URINE CULTURE  PROTIME-INR  I-STAT CHEM 8, ED  I-STAT CG4 LACTIC ACID, ED  TROPONIN I (HIGH SENSITIVITY)    EKG EKG Interpretation Date/Time:  Sunday December 31 2022 21:23:25 EDT Ventricular Rate:  116 PR Interval:    QRS Duration:  80 QT Interval:  332 QTC Calculation: 462 R Axis:   77  Text  Interpretation: Atrial flutter Ventricular premature complex Consider left ventricular hypertrophy Anterior Q waves, possibly due to LVH Borderline T abnormalities, inferior leads Confirmed by Bethann Berkshire [phone]) on 12/31/2022 10:27:12 PM  Radiology DG Chest Port 1 View  Result Date: 12/31/2022 CLINICAL DATA:  160109 with weakness and confusion. EXAM: PORTABLE CHEST 1 VIEW COMPARISON:  Portable chest 03/17/2021 FINDINGS: The heart is moderately enlarged. Stable mediastinum with aortic tortuosity and calcific plaques. There is mild perihilar vascular congestion. There is mild interstitial consolidation similar to the prior study, difficult to say if this is due to interstitial edema or chronic interstitial lung disease but there is no significant pleural effusion. The lungs are emphysematous without evidence of focal infiltrates. There is osteopenia and chronic healed fracture deformity of the proximal right clavicle. No new osseous findings.  Overlying monitor wires. IMPRESSION: 1. Cardiomegaly with mild perihilar vascular congestion. 2. Mild interstitial consolidation similar to the prior study, difficult to say if this is due to interstitial edema or chronic interstitial lung disease but there is no substantial pleural effusion. 3. Emphysema. 4. Aortic atherosclerosis. Electronically Signed   By: Almira Bar M.D.   On: 12/31/2022 21:22    Procedures Procedures  {Document cardiac monitor, telemetry assessment procedure when appropriate:1}  Medications Ordered in ED Medications  cefTRIAXone (ROCEPHIN) 2 g in sodium chloride 0.9 % 100 mL IVPB (2 g Intravenous New Bag/Given 12/31/22 2301)  sodium chloride 0.9 % bolus 1,000 mL (1,000 mLs Intravenous New Bag/Given 12/31/22 2108)  metoprolol tartrate (LOPRESSOR) injection 2.5 mg (2.5 mg Intravenous Given 12/31/22 2109)  metoprolol tartrate (LOPRESSOR) injection 2.5 mg (2.5 mg Intravenous Given 12/31/22 2208)  acetaminophen (TYLENOL) suppository 650 mg (650 mg  Rectal Given 12/31/22 2301)    ED Course/ Medical Decision Making/ A&P   {   Click here for ABCD2, HEART and other calculatorsREFRESH Note before signing :1}                              Medical Decision Making Amount and/or Complexity of Data Reviewed Labs: ordered.  Risk OTC drugs. Prescription drug management. Decision regarding hospitalization.   Will be admitted for febrile illness along with rapid atrial fibs and congestive heart failure  {Document critical care time when appropriate:1} {Document review of labs and clinical decision tools ie heart score, Chads2Vasc2 etc:1}  {Document your independent review of radiology images, and any outside records:1} {Document your discussion with family members, caretakers, and with consultants:1} {Document social determinants of health affecting pt's care:1} {Document your decision making why or why not admission, treatments were needed:1} Final Clinical Impression(s) / ED Diagnoses  Final diagnoses:  Febrile illness  Atrial fibrillation with rapid ventricular response (HCC)  Systolic congestive heart failure, unspecified HF chronicity (HCC)    Rx / DC Orders ED Discharge Orders     None

## 2023-01-01 ENCOUNTER — Inpatient Hospital Stay (HOSPITAL_COMMUNITY): Payer: Medicare Other

## 2023-01-01 ENCOUNTER — Inpatient Hospital Stay (HOSPITAL_COMMUNITY)
Admit: 2023-01-01 | Discharge: 2023-01-01 | Disposition: A | Discharge status: Home / Self Care | Payer: Medicare Other | Attending: Internal Medicine | Admitting: Internal Medicine

## 2023-01-01 DIAGNOSIS — R509 Fever, unspecified: Secondary | ICD-10-CM | POA: Diagnosis present

## 2023-01-01 DIAGNOSIS — I4891 Unspecified atrial fibrillation: Secondary | ICD-10-CM

## 2023-01-01 DIAGNOSIS — M4642 Discitis, unspecified, cervical region: Secondary | ICD-10-CM | POA: Diagnosis present

## 2023-01-01 DIAGNOSIS — G039 Meningitis, unspecified: Secondary | ICD-10-CM | POA: Diagnosis present

## 2023-01-01 DIAGNOSIS — E785 Hyperlipidemia, unspecified: Secondary | ICD-10-CM | POA: Diagnosis present

## 2023-01-01 DIAGNOSIS — R6510 Systemic inflammatory response syndrome (SIRS) of non-infectious origin without acute organ dysfunction: Secondary | ICD-10-CM

## 2023-01-01 DIAGNOSIS — I10 Essential (primary) hypertension: Secondary | ICD-10-CM

## 2023-01-01 DIAGNOSIS — E876 Hypokalemia: Secondary | ICD-10-CM | POA: Diagnosis present

## 2023-01-01 DIAGNOSIS — E871 Hypo-osmolality and hyponatremia: Secondary | ICD-10-CM | POA: Diagnosis present

## 2023-01-01 DIAGNOSIS — E860 Dehydration: Secondary | ICD-10-CM | POA: Diagnosis present

## 2023-01-01 DIAGNOSIS — R443 Hallucinations, unspecified: Secondary | ICD-10-CM | POA: Diagnosis not present

## 2023-01-01 DIAGNOSIS — R4701 Aphasia: Secondary | ICD-10-CM | POA: Diagnosis present

## 2023-01-01 DIAGNOSIS — Z1152 Encounter for screening for COVID-19: Secondary | ICD-10-CM | POA: Diagnosis not present

## 2023-01-01 DIAGNOSIS — N183 Chronic kidney disease, stage 3 unspecified: Secondary | ICD-10-CM | POA: Diagnosis present

## 2023-01-01 DIAGNOSIS — M4622 Osteomyelitis of vertebra, cervical region: Secondary | ICD-10-CM | POA: Diagnosis present

## 2023-01-01 DIAGNOSIS — R569 Unspecified convulsions: Secondary | ICD-10-CM

## 2023-01-01 DIAGNOSIS — R627 Adult failure to thrive: Secondary | ICD-10-CM | POA: Diagnosis present

## 2023-01-01 DIAGNOSIS — A419 Sepsis, unspecified organism: Secondary | ICD-10-CM | POA: Diagnosis present

## 2023-01-01 DIAGNOSIS — G40909 Epilepsy, unspecified, not intractable, without status epilepticus: Secondary | ICD-10-CM | POA: Diagnosis present

## 2023-01-01 DIAGNOSIS — Z66 Do not resuscitate: Secondary | ICD-10-CM | POA: Diagnosis present

## 2023-01-01 DIAGNOSIS — I5032 Chronic diastolic (congestive) heart failure: Secondary | ICD-10-CM

## 2023-01-01 DIAGNOSIS — G9341 Metabolic encephalopathy: Secondary | ICD-10-CM | POA: Diagnosis present

## 2023-01-01 DIAGNOSIS — I161 Hypertensive emergency: Secondary | ICD-10-CM | POA: Diagnosis present

## 2023-01-01 DIAGNOSIS — Z515 Encounter for palliative care: Secondary | ICD-10-CM | POA: Diagnosis not present

## 2023-01-01 DIAGNOSIS — I130 Hypertensive heart and chronic kidney disease with heart failure and stage 1 through stage 4 chronic kidney disease, or unspecified chronic kidney disease: Secondary | ICD-10-CM | POA: Diagnosis present

## 2023-01-01 DIAGNOSIS — I4892 Unspecified atrial flutter: Secondary | ICD-10-CM | POA: Diagnosis present

## 2023-01-01 DIAGNOSIS — E872 Acidosis, unspecified: Secondary | ICD-10-CM | POA: Diagnosis present

## 2023-01-01 DIAGNOSIS — G4731 Primary central sleep apnea: Secondary | ICD-10-CM

## 2023-01-01 DIAGNOSIS — I5020 Unspecified systolic (congestive) heart failure: Secondary | ICD-10-CM | POA: Diagnosis not present

## 2023-01-01 DIAGNOSIS — R6520 Severe sepsis without septic shock: Secondary | ICD-10-CM | POA: Diagnosis present

## 2023-01-01 LAB — CBC
HCT: 39.4 % (ref 36.0–46.0)
Hemoglobin: 13.1 g/dL (ref 12.0–15.0)
MCH: 30.1 pg (ref 26.0–34.0)
MCHC: 33.2 g/dL (ref 30.0–36.0)
MCV: 90.6 fL (ref 80.0–100.0)
Platelets: 145 10*3/uL — ABNORMAL LOW (ref 150–400)
RBC: 4.35 MIL/uL (ref 3.87–5.11)
RDW: 15.4 % (ref 11.5–15.5)
WBC: 8.7 10*3/uL (ref 4.0–10.5)
nRBC: 0 % (ref 0.0–0.2)

## 2023-01-01 LAB — RESP PANEL BY RT-PCR (RSV, FLU A&B, COVID)  RVPGX2
Influenza A by PCR: NEGATIVE
Influenza B by PCR: NEGATIVE
Resp Syncytial Virus by PCR: NEGATIVE
SARS Coronavirus 2 by RT PCR: NEGATIVE

## 2023-01-01 LAB — ECHOCARDIOGRAM COMPLETE
AR max vel: 1.56 cm2
AV Area VTI: 1.47 cm2
AV Area mean vel: 1.38 cm2
AV Mean grad: 3 mmHg
AV Peak grad: 4.6 mmHg
Ao pk vel: 1.07 m/s
Area-P 1/2: 5.23 cm2
Height: 58 in
S' Lateral: 3.4 cm
Weight: 1703.71 [oz_av]

## 2023-01-01 LAB — RESPIRATORY PANEL BY PCR

## 2023-01-01 LAB — BASIC METABOLIC PANEL
Anion gap: 14 (ref 5–15)
BUN: 11 mg/dL (ref 8–23)
CO2: 22 mmol/L (ref 22–32)
Calcium: 8.5 mg/dL — ABNORMAL LOW (ref 8.9–10.3)
Chloride: 96 mmol/L — ABNORMAL LOW (ref 98–111)
Creatinine, Ser: 0.59 mg/dL (ref 0.44–1.00)
GFR, Estimated: >60 mL/min (ref >60)
Glucose, Bld: 93 mg/dL (ref 70–99)
Potassium: 3.1 mmol/L — ABNORMAL LOW (ref 3.5–5.1)
Sodium: 132 mmol/L — ABNORMAL LOW (ref 135–145)

## 2023-01-01 LAB — TSH: TSH: 2.26 u[IU]/mL (ref 0.350–4.500)

## 2023-01-01 LAB — CK: Total CK: 130 U/L (ref 38–234)

## 2023-01-01 LAB — TROPONIN I (HIGH SENSITIVITY): Troponin I (High Sensitivity): 33 ng/L — ABNORMAL HIGH (ref <18)

## 2023-01-01 MED ORDER — SODIUM CHLORIDE 0.9 % IV SOLN
2.0000 g | INTRAVENOUS | Status: DC
  Administered 2023-01-01: 2 g via INTRAVENOUS
  Filled 2023-01-01: qty 20

## 2023-01-01 MED ORDER — ACETAMINOPHEN 325 MG PO TABS
650.0000 mg | ORAL_TABLET | Freq: Four times a day (QID) | ORAL | Status: DC | PRN
  Administered 2023-01-01: 650 mg via ORAL
  Filled 2023-01-01 (×2): qty 2

## 2023-01-01 MED ORDER — LEVETIRACETAM IN NACL 500 MG/100ML IV SOLN
500.0000 mg | INTRAVENOUS | Status: DC
  Administered 2023-01-01 – 2023-01-02 (×2): 500 mg via INTRAVENOUS
  Filled 2023-01-01 (×2): qty 100

## 2023-01-01 MED ORDER — ASPIRIN 81 MG PO TBEC
81.0000 mg | DELAYED_RELEASE_TABLET | Freq: Every day | ORAL | Status: DC
  Administered 2023-01-01: 81 mg via ORAL
  Filled 2023-01-01 (×2): qty 1

## 2023-01-01 MED ORDER — ENOXAPARIN SODIUM 40 MG/0.4ML IJ SOSY
40.0000 mg | PREFILLED_SYRINGE | INTRAMUSCULAR | Status: DC
  Administered 2023-01-01 – 2023-01-02 (×2): 40 mg via SUBCUTANEOUS
  Filled 2023-01-01 (×2): qty 0.4

## 2023-01-01 MED ORDER — HYDRALAZINE HCL 20 MG/ML IJ SOLN
5.0000 mg | Freq: Once | INTRAMUSCULAR | Status: AC
  Administered 2023-01-01: 5 mg via INTRAVENOUS
  Filled 2023-01-01: qty 1

## 2023-01-01 MED ORDER — METRONIDAZOLE 500 MG/100ML IV SOLN
500.0000 mg | Freq: Two times a day (BID) | INTRAVENOUS | Status: DC
  Administered 2023-01-01 – 2023-01-02 (×3): 500 mg via INTRAVENOUS
  Filled 2023-01-01 (×3): qty 100

## 2023-01-01 MED ORDER — SODIUM CHLORIDE 0.9 % IV SOLN: INTRAVENOUS | Status: DC

## 2023-01-01 MED ORDER — METOPROLOL TARTRATE 5 MG/5ML IV SOLN
2.5000 mg | INTRAVENOUS | Status: DC | PRN
  Administered 2023-01-01 – 2023-01-05 (×8): 2.5 mg via INTRAVENOUS
  Filled 2023-01-01 (×7): qty 5

## 2023-01-01 MED ORDER — EZETIMIBE 10 MG PO TABS
10.0000 mg | ORAL_TABLET | Freq: Every day | ORAL | Status: DC
  Administered 2023-01-01: 10 mg via ORAL
  Filled 2023-01-01 (×2): qty 1

## 2023-01-01 MED ORDER — ACETAMINOPHEN 650 MG RE SUPP
650.0000 mg | Freq: Once | RECTAL | Status: AC
  Administered 2023-01-01: 650 mg via RECTAL
  Filled 2023-01-01: qty 1

## 2023-01-01 NOTE — Assessment & Plan Note (Signed)
-  secondary to infection unknown source -baseline with no cognitive impairment per family

## 2023-01-01 NOTE — Progress Notes (Signed)
[facility] PLAN OF CARE NOTE Patient: [name] ZOX:[id number]   PCP: Tracey Harries, MD DOB: 03/24/1930   DOA: 12/31/2022   DOS: 01/01/2023    Patient was admitted by my colleague earlier on 01/01/2023. I have reviewed the H&P as well as assessment and plan and agree with the same. Important changes in the plan are listed below.  Plan of care: Principal Problem:   SIRS (systemic inflammatory response syndrome) (HCC) Active Problems:   Atrial fibrillation with RVR (HCC)   Complex sleep apnea syndrome   Essential hypertension   Hyponatremia   Seizure disorder (HCC)   Diastolic CHF (HCC)   Lactic acidosis   Acute metabolic encephalopathy   Fever   Acute encephalopathy. Likely metabolic in nature. Concern for TIA/CVA. Patient has aphasia, anomia, left gaze preference. Although generalized weakness in the lower part of the body only. MRI brain negative for any acute stroke. EEG ordered currently pending. Continue with IV antibiotic for now. Respiratory virus pathogen panel is negative therefore will discontinue droplet isolation. CT was not ordered which is currently ordered. Family reports that the patient has IBS with diarrhea but in the last 3 to 4 days she has increased frequency and is unable to make to the bathroom. Speech evaluation reviewed as well Level of care: Telemetry Continue telemetry due to concern for stroke.  A-fib reported on admission, atrial flutter reported on the EKG, I am able to see P waves and I am unable to see flutter while on all 3 EKGs therefore will just monitor on telemetry.  Author: Lynden Oxford, MD  [facility] 01/01/2023 6:04 PM   If 7PM-7AM, please contact night-coverage at [email or web address]

## 2023-01-01 NOTE — H&P (Addendum)
History and Physical    Patient: [name] HYQ:[id number] DOB: 01/26/1930 DOA: 12/31/2022 DOS: the patient was seen and examined on 01/01/2023 PCP: Tracey Harries, MD  Patient coming from: Home  Chief Complaint:  Chief Complaint  Patient presents with   Weakness   HPI: [name] is a 89 y.o. female with medical history significant of chronic diastolic heart failure, hypertension, OSA, seizure disorder who presents with weakness and confusion.  Close family friend at bedside provides history. Pt disoriented and pulling at leads but was able to follow commands. She lives at home with her son who felt she was very fatigued starting yesterday and today was confused.Reports at baseline has no cognitive impairment. No cough or shortness. No nausea, vomiting. Has chronic diarrhea and unclear if worse. No abdominal pain. No dysuria, increase urgency or frequency.  She has a small wound to left lower extremity that is being followed by wound care and last examined on Wednesday. Family did not want me to unwrap her dressing but states it is wound is very small and almost healed.   In the ED, she was febrile to 101.40F, in atrial fibrillation with RVR heart rate of 134, BP of 144/100 on room air.  CBC without any leukocytosis or anemia.  CMP notable for mild hyponatremia of 134, glucose of 118, stable creatinine 0.6.  UA with negative leukocyte, negative nitrite and rare bacteria.  COVID PCR pending at time admission.  Patient was given IV fluids, multiple doses of Lopressor for her atrial fibrillation with RVR with improvement of her heart rates around 100.  She was started on IV Rocephin for presumed UTI. Review of Systems: As mentioned in the history of present illness. All other systems reviewed and are negative. Past Medical History:  Diagnosis Date   Abdominal pain, other specified site    Allergic rhinitis    Anemia    Arthritis    CAD (coronary artery disease) 12/2017    s/p stent   Chronic kidney disease    stage 3 kidney disease per patient on 05/12/20   Diastolic CHF (HCC) 12/2017   grade 1   Essential hypertension, benign 06/17/2018   History of non-ST elevation myocardial infarction (NSTEMI) 06/17/2018   HLD (hyperlipidemia)    Hypertension    Irregular heart rate    Ischemic cardiomyopathy 06/17/2018   Myocardial infarction ([facility]CC) [facility]   OSA (obstructive sleep apnea)    uses adaptive servo ventilation machine at night   Pneumonia    Seizure disorder (HCC)    last seizure in the 1950s-well controlled with keppra   Skin cancer    left arm   Past Surgical History:  Procedure Laterality Date   BACK SURGERY     CARDIAC CATHETERIZATION  02/09/2000   CHOLECYSTECTOMY     EYE SURGERY Bilateral    cataracts removed   FEMUR IM NAIL Right 03/18/2021   Procedure: INTRAMEDULLARY (IM) NAIL FEMORAL;  Surgeon: Yolonda Kida, MD;  Location: [facility];  Service: Orthopedics;  Laterality: Right;   HARDWARE REMOVAL Right 05/14/2020   Procedure: HARDWARE REMOVAL RIGHT FEMUR;  Surgeon: Roby Lofts, MD;  Location: [facility];  Service: Orthopedics;  Laterality: Right;   KNEE SURGERY     laparoscopic   ORIF FEMUR FRACTURE Right 12/15/2019   Procedure: OPEN REDUCTION INTERNAL FIXATION (ORIF) DISTAL FEMUR FRACTURE;  Surgeon: Roby Lofts, MD;  Location: [facility];  Service: Orthopedics;  Laterality: Right;   TOTAL  ABDOMINAL HYSTERECTOMY     TOTAL KNEE ARTHROPLASTY Right 11/15/2020   Procedure: TOTAL KNEE ARTHROPLASTY;  Surgeon: Ollen Gross, MD;  Location: [facility];  Service: Orthopedics;  Laterality: Right;    WRIST FRACTURE SURGERY Bilateral    x 2   Social History:  reports that she has never smoked. She has never used smokeless tobacco. She reports that she does not drink alcohol and does not use drugs.  Allergies  Allergen Reactions   Codeine Hypertension and Other (See Comments)    Causes a sensation of "pressure" within her  head  Other Reaction(s): Not available, Other, Unknown  Other Reaction(s): Hypertension    Causes a sensation of "pressure" within her head    Causes a sensation of "pressure" within her head Causes a sensation of "pressure" within her head Causes a sensation of "pressure" within her head   Latex Itching    Other Reaction(s): Unknown   Tape Itching   Wound Dressing Adhesive Itching    Family History  Problem Relation Age of Onset   Heart disease Father    Skin cancer Father    Skin cancer Mother    Heart attack Mother    Stroke Sister    Congenital heart disease Niece    Renal Disease Niece     Prior to Admission medications   Medication Sig Start Date End Date Taking? Authorizing Provider  aspirin EC 81 MG tablet Take 81 mg by mouth daily. Swallow whole.    [provider]  diphenoxylate-atropine (LOMOTIL) 2.5-0.025 MG tablet Take 1 tablet by mouth 4 (four) times daily as needed for diarrhea or loose stools. 01/15/20   Medina-Vargas, Monina C, NP  ezetimibe (ZETIA) 10 MG tablet Take 1 tablet (10 mg total) by mouth daily. 09/15/20   Patwardhan, Anabel Bene, MD  ferrous sulfate 325 (65 FE) MG EC tablet Take 1 tablet (325 mg total) by mouth daily with breakfast. 03/22/21 12/31/22  [place], Alvira Philips, DO  gabapentin (NEURONTIN) 100 MG capsule Take 100 mg by mouth at bedtime. 01/25/22   [provider]  levETIRAcetam (KEPPRA) 500 MG tablet Take 1 tablet (500 mg total) by mouth 2 (two) times daily. Patient taking differently: Take 500 mg by mouth at bedtime. 01/15/20   Medina-Vargas, Monina C, NP  nitroGLYCERIN (NITROSTAT) 0.4 MG SL tablet Place 1 tablet (0.4 mg total) under the tongue every 5 (five) minutes as needed for chest pain. 01/15/20   Medina-Vargas, Monina C, NP  oxybutynin (DITROPAN-XL) 10 MG 24 hr tablet Take 10 mg by mouth at bedtime. 03/12/20   [provider]    Physical Exam: Vitals:   12/31/22 2248 12/31/22 2330 01/01/23 0007 01/01/23 0045  BP:  (!)  147/131  (!) 154/75  Pulse: (!) 121 (!) 117  (!) 109  Resp: (!) 23 18  (!) 24  Temp: (!) 101.5 F (38.6 C) 98.2 F (36.8 C) (!) 101.1 F (38.4 C)   TempSrc: Rectal  Rectal   SpO2: 100% 97%  98%  Weight:      Height:       Constitutional: NAD, calm, comfortable, thin frail elderly female laying in bed initially trying to pull off her telemetry leads but was able to calm down 3 direction of her family friend. eyes: lids and conjunctivae normal ENMT: Mucous membranes are DRY.  Poor dentition Neck: normal, supple,  Respiratory: Faint right sided crackles. Normal respiratory effort. No accessory muscle use.  On room air. Cardiovascular: Regular rate and rhythm, no  murmurs / rubs / gallops. No extremity edema. 2+ pedal pulses.   Abdomen: no tenderness, soft bowel sounds positive.  Musculoskeletal: no clubbing / cyanosis. No joint deformity upper and lower extremities.  Muscle wasting in all extremities Skin: no rashes, lesions, ulcers. No induration.  Left lower extremity wrapped in CLEAN ace bandage-family deferred removal of wrap for exam but reports it being almost completely healed. Neurologic: CN 2-12 grossly intact.  Patient is disoriented and mostly kept her eyes closed.  She was able to follow commands. Psychiatric: Fatigue and disoriented. Data Reviewed:  See HPI  Assessment and Plan: * SIRS (systemic inflammatory response syndrome) (HCC) -unclear source. Presents with fever of 101.5 -negative CXR, UA -negative COVID/Flu/ RSV- test full respiratory viral panel -She has left lower extremity wound which family reports is almost healed and was last seen several days ago by wound care.  Family deferred removal of bandage for exam. -does not report symptoms other than chronic diarrhea that family is unsure if it is worse. Will obtain stool study. -will continue Rocephin and broaden with IV Flagyl as well  -pending blood culture -follow fever trend  Atrial fibrillation with RVR  (HCC) -Unclear if she has previous diagnosis of this. No records found in epic -presented with atrial flutter/fibrillation HR 130. Improved after several dose of Lopressor in ED -Keep on continuous telemetry  -CH2AD2VASc score of 5- discuss with family friend who deferred anticoagulation discuss to when pt clinically improves and can make decision for herself. However given her age risk of anticoagulation likely outweight risk. Will hold on anticoagulation.  -obtain echocardiogram. Last echocardiogram in 11/2019 with a EF of 60 to 65%, mild LVH, indeterminate diastolic function.  Trivial MV regurgitation, mild aortic valve sclerosis. -check TSH -Goal HR <100. PRN Lopressor with perimeters.  Acute metabolic encephalopathy -secondary to infection unknown source -baseline with no cognitive impairment per family   Lactic acidosis -lactic acid of 2.3. Resolved with IV fluids.  Diastolic CHF (HCC) -appears dehydrated on exam -keep on continuous IV fluid   Seizure disorder (HCC) Continue Keppra through IV  Hyponatremia Mild. Keep on continuous IV fluid overnight.  Essential hypertension -elevated but not on home antihypertensive -monitor for now  Complex sleep apnea syndrome -normally on Bipap but will hold with acute metabolic encephalopathy      Advance Care Planning:   Code Status: Limited: Do not attempt resuscitation (DNR) -DNR-LIMITED -Do Not Intubate/DNI    Consults: none  Family Communication: discuss with female family friend at bedside  Severity of Illness: The appropriate patient status for this patient is INPATIENT. Inpatient status is judged to be reasonable and necessary in order to provide the required intensity of service to ensure the patient's safety. The patient's presenting symptoms, physical exam findings, and initial radiographic and laboratory data in the context of their chronic comorbidities is felt to place them at high risk for further clinical  deterioration. Furthermore, it is not anticipated that the patient will be medically stable for discharge from the hospital within 2 midnights of admission.   * I certify that at the point of admission it is my clinical judgment that the patient will require inpatient hospital care spanning beyond 2 midnights from the point of admission due to high intensity of service, high risk for further deterioration and high frequency of surveillance required.*  Author: Anselm Jungling, DO 01/01/2023 1:36 AM  For on call review [email or web address].

## 2023-01-01 NOTE — Assessment & Plan Note (Addendum)
-  elevated but not on home antihypertensive -monitor for now

## 2023-01-01 NOTE — Procedures (Signed)
Patient Name: [name]  MRN: [id number]  Epilepsy Attending: Charlsie Quest  Referring Physician/Provider: Rolly Salter, MD  Date: 01/01/2023 Duration: 27.24 mins  Patient history: 89yo F with aphasia, anomia, left gaze preference. EEG to evaluate for seizure.  Level of alertness: Awake  AEDs during EEG study: LEV  Technical aspects: This EEG study was done with scalp electrodes positioned according to the 10-20 International system of electrode placement. Electrical activity was reviewed with band pass filter of 1-70Hz , sensitivity of 7 uV/mm, display speed of 3mm/sec with a 60Hz  notched filter applied as appropriate. EEG data were recorded continuously and digitally stored.  Video monitoring was available and reviewed as appropriate.  Description: The posterior dominant rhythm consists of 7 Hz activity of moderate voltage (25-35 uV) seen predominantly in posterior head regions, asymmetric ( left<right) and reactive to eye opening and eye closing. EEG showed continuous low amplitude 2-3hz  delta slowing as well as 6-7hz  theta slowing in right hemisphere Sharp transients were noted in right frontal region. Hyperventilation and photic stimulation were not performed.     ABNORMALITY - Continuous slow, generalized and lateralized left hemisphere - background asymmetry, left<right  IMPRESSION: This study is cortical dysfunction arising from left hemisphere likely secondary to underlying structural abnormality, post-ictal state. Additionally there is mild diffuse encephalopathy, nonspecific etiology. No seizures or definite epileptiform discharges were seen throughout the recording.  Madalene Mickler [name]

## 2023-01-01 NOTE — H&P (Incomplete)
History and Physical    Patient: [name]:[id number] DOB: 04/24/1930 DOA: 12/31/2022 DOS: the patient was seen and examined on 01/01/2023 PCP: Tracey Harries, MD  Patient coming from: Home  Chief Complaint:  Chief Complaint  Patient presents with  . Weakness   HPI: [name] is a 89 y.o. female with medical history significant of ***   In the ED, she was febrile to 101.28F, in atrial fibrillation with RVR heart rate of 134, BP of 144/100 on room air.   Review of Systems: {ROS_Text:26778} Past Medical History:  Diagnosis Date  . Abdominal pain, other specified site   . Allergic rhinitis   . Anemia   . Arthritis   . CAD (coronary artery disease) 12/2017   s/p stent  . Chronic kidney disease    stage 3 kidney disease per patient on 05/12/20  . Diastolic CHF (HCC) 12/2017   grade 1  . Essential hypertension, benign 06/17/2018  . History of non-ST elevation myocardial infarction (NSTEMI) 06/17/2018  . HLD (hyperlipidemia)   . Hypertension   . Irregular heart rate   . Ischemic cardiomyopathy 06/17/2018  . Myocardial infarction (HCC) 12/2017    [facility]  . OSA (obstructive sleep apnea)    uses adaptive servo ventilation machine at night  . Pneumonia   . Seizure disorder (HCC)    last seizure in the 1950s-well controlled with keppra  . Skin cancer    left arm   Past Surgical History:  Procedure Laterality Date  . BACK SURGERY    . CARDIAC CATHETERIZATION  02/09/2000  . CHOLECYSTECTOMY    . EYE SURGERY Bilateral    cataracts removed  . FEMUR IM NAIL Right 03/18/2021   Procedure: INTRAMEDULLARY (IM) NAIL FEMORAL;  Surgeon: Yolonda Kida, MD;  Location: [facility];  Service: Orthopedics;  Laterality: Right;  . HARDWARE REMOVAL Right 05/14/2020   Procedure: HARDWARE REMOVAL RIGHT FEMUR;  Surgeon: Roby Lofts, MD;  Location: [facility];  Service: Orthopedics;  Laterality: Right;  . KNEE SURGERY     laparoscopic  . ORIF FEMUR FRACTURE Right  12/15/2019   Procedure: OPEN REDUCTION INTERNAL FIXATION (ORIF) DISTAL FEMUR FRACTURE;  Surgeon: Roby Lofts, MD;  Location: [facility];  Service: Orthopedics;  Laterality: Right;  . TOTAL ABDOMINAL HYSTERECTOMY    . TOTAL KNEE ARTHROPLASTY Right 11/15/2020   Procedure: TOTAL KNEE ARTHROPLASTY;  Surgeon: Ollen Gross, MD;  Location: [facility];  Service: Orthopedics;  Laterality: Right;   . WRIST FRACTURE SURGERY Bilateral    x 2   Social History:  reports that she has never smoked. She has never used smokeless tobacco. She reports that she does not drink alcohol and does not use drugs.  Allergies  Allergen Reactions  . Codeine Hypertension and Other (See Comments)    Causes a sensation of "pressure" within her head  Other Reaction(s): Not available, Other, Unknown  Other Reaction(s): Hypertension    Causes a sensation of "pressure" within her head    Causes a sensation of "pressure" within her head Causes a sensation of "pressure" within her head Causes a sensation of "pressure" within her head  . Latex Itching    Other Reaction(s): Unknown  . Tape Itching  . Wound Dressing Adhesive Itching    Family History  Problem Relation Age of Onset  . Heart disease Father   . Skin cancer Father   . Skin cancer Mother   . Heart attack Mother   . Stroke Sister   .  Congenital heart disease Niece   . Renal Disease Niece     Prior to Admission medications   Medication Sig Start Date End Date Taking? Authorizing Provider  aspirin EC 81 MG tablet Take 81 mg by mouth daily. Swallow whole.    [provider]  diphenoxylate-atropine (LOMOTIL) 2.5-0.025 MG tablet Take 1 tablet by mouth 4 (four) times daily as needed for diarrhea or loose stools. 01/15/20   Medina-Vargas, Monina C, NP  ezetimibe (ZETIA) 10 MG tablet Take 1 tablet (10 mg total) by mouth daily. 09/15/20   Patwardhan, Anabel Bene, MD  ferrous sulfate 325 (65 FE) MG EC tablet Take 1 tablet (325 mg total) by mouth daily with  breakfast. 03/22/21 12/31/22  [place], Alvira Philips, DO  gabapentin (NEURONTIN) 100 MG capsule Take 100 mg by mouth at bedtime. 01/25/22   [provider]  levETIRAcetam (KEPPRA) 500 MG tablet Take 1 tablet (500 mg total) by mouth 2 (two) times daily. Patient taking differently: Take 500 mg by mouth at bedtime. 01/15/20   Medina-Vargas, Monina C, NP  nitroGLYCERIN (NITROSTAT) 0.4 MG SL tablet Place 1 tablet (0.4 mg total) under the tongue every 5 (five) minutes as needed for chest pain. 01/15/20   Medina-Vargas, Monina C, NP  oxybutynin (DITROPAN-XL) 10 MG 24 hr tablet Take 10 mg by mouth at bedtime. 03/12/20   [provider]    Physical Exam: Vitals:   12/31/22 2112 12/31/22 2215 12/31/22 2248 12/31/22 2330  BP:  (!) 161/107  (!) 147/131  Pulse:  99 (!) 121 (!) 117  Resp:  19 (!) 23 18  Temp:  98 F (36.7 C) (!) 101.5 F (38.6 C) 98.2 F (36.8 C)  TempSrc:  Oral Rectal   SpO2:  99% 100% 97%  Weight: 57 kg     Height: 4\' 11"  (1.499 m)      *** Data Reviewed: {Tip this will not be part of the note when signed- Document your independent interpretation of telemetry tracing, EKG, lab, Radiology test or any other diagnostic tests. Add any new diagnostic test ordered today. (Optional):26781} {Results:26384}  Assessment and Plan: No notes have been filed under this hospital service. Service: Hospitalist     Advance Care Planning:   Code Status: Prior ***  Consults: ***  Family Communication: ***  Severity of Illness: {Observation/Inpatient:21159}  Author: Anselm Jungling, DO 01/01/2023 12:02 AM  For on call review [email or web address].

## 2023-01-01 NOTE — Progress Notes (Signed)
EEG complete - results pending 

## 2023-01-01 NOTE — TOC CM/SW Note (Signed)
Transition of Care [facility]) - Inpatient Brief Assessment   Patient Details  Name: [name] MRN: [id number] Date of Birth: 1929-11-22  Transition of Care [facility]) CM/SW Contact:    Howell Rucks, RN Phone Number: 01/01/2023, 3:05 PM   Clinical Narrative: Met with pt, pt's son ([name]) and ex dtr in law at bedside to introduce role of TOC/NCM and review for dc planning, per son, pt resides with him, he is the primary caregiver, prior to current admission pt was ambulatory with a walker as needed, confirmed pt has PCP and pharmacy in place, son to provide transportation at discharge. TOC Brief Assessment completed. No TOC needs identified at this time.     Transition of Care Asessment: Insurance and Status: Insurance coverage has been reviewed Patient has primary care physician: Yes Home environment has been reviewed: resides with son in private residence Prior level of function:: Independent with walker Prior/Current Home Services: No current home services Social Determinants of Health Reivew: SDOH reviewed no interventions necessary Readmission risk has been reviewed: Yes Transition of care needs: no transition of care needs at this time

## 2023-01-01 NOTE — ED Notes (Signed)
ED TO INPATIENT HANDOFF REPORT  Name/Age/Gender [name] 89 y.o. female  Code Status    Code Status Orders  (From admission, onward)           Start     Ordered   01/01/23 0112  Do not attempt resuscitation (DNR)- Limited -Do Not Intubate (DNI)  Continuous       Question Answer Comment  If pulseless and not breathing No CPR or chest compressions.   In Pre-Arrest Conditions (Patient Is Breathing and Has A Pulse) Do not intubate. Provide all appropriate non-invasive medical interventions. Avoid ICU transfer unless indicated or required.   Consent: Discussion documented in EHR or advanced directives reviewed      01/01/23 0112           Code Status History     Date Active Date Inactive Code Status Order ID Comments User Context   03/17/2021 2332 03/22/2021 1759 DNR [id number]  Angie Fava, DO ED   11/15/2020 1620 11/18/2020 2219 Full Code [id number]  Derenda Fennel, PA Inpatient   12/26/2019 0929 05/14/2020 0504 DNR [id number]  Bufford Spikes L Outpatient   12/14/2019 1842 12/23/2019 2034 Full Code [id number]  Andria Meuse, MD ED   04/25/2018 0841 04/27/2018 1750 Full Code [id number]  Jonah Blue, MD ED   10/09/2015 0006 10/11/2015 1809 Full Code [id number]  Therisa Doyne, MD Inpatient   10/01/2015 2306 10/03/2015 1608 Full Code [id number]  Briscoe Deutscher, MD ED       Home/SNF/Other Home  Chief Complaint Fever [R50.9]  Level of Care/Admitting Diagnosis ED Disposition     ED Disposition  Admit   Condition  --   Comment  Hospital Area: [facility] [[id number]]  Level of Care: Telemetry [5]  Admit to tele based on following criteria: Other see comments  Comments: rate  May admit patient to [facility] or [facility] if equivalent level of care is available:: No  Covid Evaluation: Asymptomatic - no recent exposure (last 10 days) testing not required  Diagnosis: Fever [344092]  Admitting Physician: Anselm Jungling [[id number]]   Attending Physician: Anselm Jungling [[id number]]  Certification:: I certify this patient will need inpatient services for at least 2 midnights  Expected Medical Readiness: 01/03/2023          Medical History Past Medical History:  Diagnosis Date   Abdominal pain, other specified site    Allergic rhinitis    Anemia    Arthritis    CAD (coronary artery disease) 12/2017   s/p stent   Chronic kidney disease    stage 3 kidney disease per patient on 05/12/20   Diastolic CHF (HCC) 12/2017   grade 1   Essential hypertension, benign 06/17/2018   History of non-ST elevation myocardial infarction (NSTEMI) 06/17/2018   HLD (hyperlipidemia)    Hypertension    Irregular heart rate    Ischemic cardiomyopathy 06/17/2018   Myocardial infarction (HCC) 12/2017    [facility]   OSA (obstructive sleep apnea)    uses adaptive servo ventilation machine at night   Pneumonia    Seizure disorder (HCC)    last seizure in the 1950s-well controlled with keppra   Skin cancer    left arm    Allergies Allergies  Allergen Reactions   Codeine Hypertension and Other (See Comments)    Causes a sensation of "pressure" within her head  Other Reaction(s): Not available, Other, Unknown  Other Reaction(s): Hypertension    Causes a sensation  of "pressure" within her head    Causes a sensation of "pressure" within her head Causes a sensation of "pressure" within her head Causes a sensation of "pressure" within her head   Latex Itching    Other Reaction(s): Unknown   Tape Itching   Wound Dressing Adhesive Itching    IV Location/Drains/Wounds Patient Lines/Drains/Airways Status     Active Line/Drains/Airways     Name Placement date Placement time Site Days   Peripheral IV 12/31/22 20 G Right Antecubital 12/31/22  2030  Antecubital  1   Peripheral IV Anterior;Left;Proximal Antecubital --  --  Antecubital  --            Labs/Imaging Results for orders placed or performed during the hospital  encounter of 12/31/22 (from the past 48 hour(s))  Comprehensive metabolic panel     Status: Abnormal   Collection Time: 12/31/22  8:32 PM  Result Value Ref Range   Sodium 134 (L) 135 - 145 mmol/L   Potassium 3.5 3.5 - 5.1 mmol/L   Chloride 97 (L) 98 - 111 mmol/L   CO2 25 22 - 32 mmol/L   Glucose, Bld 118 (H) 70 - 99 mg/dL    Comment: Glucose reference range applies only to samples taken after fasting for at least 8 hours.   BUN 14 8 - 23 mg/dL   Creatinine, Ser 9.60 0.44 - 1.00 mg/dL   Calcium 9.5 8.9 - 45.4 mg/dL   Total Protein 8.3 (H) 6.5 - 8.1 g/dL   Albumin 4.6 3.5 - 5.0 g/dL   AST 32 15 - 41 U/L   ALT 19 0 - 44 U/L   Alkaline Phosphatase 95 38 - 126 U/L   Total Bilirubin 1.3 (H) 0.3 - 1.2 mg/dL   GFR, Estimated >09 >81 mL/min    Comment: (NOTE) Calculated using the CKD-EPI Creatinine Equation (2021)    Anion gap 12 5 - 15    Comment: Performed at [facility], [address] [place], [place] [address]  CBC with Differential     Status: Abnormal   Collection Time: 12/31/22  8:32 PM  Result Value Ref Range   WBC 9.2 4.0 - 10.5 K/uL   RBC 4.41 3.87 - 5.11 MIL/uL   Hemoglobin 12.9 12.0 - 15.0 g/dL   HCT 82.9 56.2 - 13.0 %   MCV 91.4 80.0 - 100.0 fL   MCH 29.3 26.0 - 34.0 pg   MCHC 32.0 30.0 - 36.0 g/dL   RDW 86.5 (H) 78.4 - 69.6 %   Platelets 165 150 - 400 K/uL   nRBC 0.0 0.0 - 0.2 %   Neutrophils Relative % 83 %   Neutro Abs 7.6 1.7 - 7.7 K/uL   Lymphocytes Relative 11 %   Lymphs Abs 1.0 0.7 - 4.0 K/uL   Monocytes Relative 6 %   Monocytes Absolute 0.6 0.1 - 1.0 K/uL   Eosinophils Relative 0 %   Eosinophils Absolute 0.0 0.0 - 0.5 K/uL   Basophils Relative 0 %   Basophils Absolute 0.0 0.0 - 0.1 K/uL   Immature Granulocytes 0 %   Abs Immature Granulocytes 0.04 0.00 - 0.07 K/uL    Comment: Performed at [facility], [address], [place], [place] [address]  Protime-INR     Status: None   Collection Time: 12/31/22  8:32 PM   Result Value Ref Range   Prothrombin Time 14.4 11.4 - 15.2 seconds   INR 1.1 0.8 - 1.2    Comment: (  NOTE) INR goal varies based on device and disease states. Performed at [facility], [address], [place], [place] [address]   Resp panel by RT-PCR (RSV, Flu A&B, Covid) Anterior Nasal Swab     Status: None   Collection Time: 12/31/22  8:45 PM   Specimen: Anterior Nasal Swab  Result Value Ref Range   SARS Coronavirus 2 by RT PCR NEGATIVE NEGATIVE    Comment: (NOTE) SARS-CoV-2 target nucleic acids are NOT DETECTED.  The SARS-CoV-2 RNA is generally detectable in upper respiratory specimens during the acute phase of infection. The lowest concentration of SARS-CoV-2 viral copies this assay can detect is 138 copies/mL. A negative result does not preclude SARS-Cov-2 infection and should not be used as the sole basis for treatment or other patient management decisions. A negative result may occur with  improper specimen collection/handling, submission of specimen other than nasopharyngeal swab, presence of viral mutation(s) within the areas targeted by this assay, and inadequate number of viral copies(<138 copies/mL). A negative result must be combined with clinical observations, patient history, and epidemiological information. The expected result is Negative.  Fact Sheet for Patients:  BloggerCourse.com  Fact Sheet for Healthcare Providers:  SeriousBroker.it  This test is no t yet approved or cleared by the [place] FDA and  has been authorized for detection and/or diagnosis of SARS-CoV-2 by FDA under an Emergency Use Authorization (EUA). This EUA will remain  in effect (meaning this test can be used) for the duration of the COVID-19 declaration under Section 564(b)(1) of the Act, 21 U.S.C.section 360bbb-3(b)(1), unless the authorization is terminated  or revoked sooner.       Influenza A by PCR  NEGATIVE NEGATIVE   Influenza B by PCR NEGATIVE NEGATIVE    Comment: (NOTE) The Xpert Xpress SARS-CoV-2/FLU/RSV plus assay is intended as an aid in the diagnosis of influenza from Nasopharyngeal swab specimens and should not be used as a sole basis for treatment. Nasal washings and aspirates are unacceptable for Xpert Xpress SARS-CoV-2/FLU/RSV testing.  Fact Sheet for Patients: BloggerCourse.com  Fact Sheet for Healthcare Providers: SeriousBroker.it  This test is not yet approved or cleared by the [place] FDA and has been authorized for detection and/or diagnosis of SARS-CoV-2 by FDA under an Emergency Use Authorization (EUA). This EUA will remain in effect (meaning this test can be used) for the duration of the COVID-19 declaration under Section 564(b)(1) of the Act, 21 U.S.C. section 360bbb-3(b)(1), unless the authorization is terminated or revoked.     Resp Syncytial Virus by PCR NEGATIVE NEGATIVE    Comment: (NOTE) Fact Sheet for Patients: BloggerCourse.com  Fact Sheet for Healthcare Providers: SeriousBroker.it  This test is not yet approved or cleared by the [place] FDA and has been authorized for detection and/or diagnosis of SARS-CoV-2 by FDA under an Emergency Use Authorization (EUA). This EUA will remain in effect (meaning this test can be used) for the duration of the COVID-19 declaration under Section 564(b)(1) of the Act, 21 U.S.C. section 360bbb-3(b)(1), unless the authorization is terminated or revoked.  Performed at [facility], [address] [place], [place] [address]   I-Stat Lactic Acid, ED     Status: Abnormal   Collection Time: 12/31/22  8:52 PM  Result Value Ref Range   Lactic Acid, Venous 2.3 (HH) 0.5 - 1.9 mmol/L   Comment NOTIFIED PHYSICIAN   Urinalysis, w/ Reflex to Culture (Infection Suspected) -Urine, Clean Catch      Status: Abnormal  Collection Time: 12/31/22  9:31 PM  Result Value Ref Range   Specimen Source URINE, CATHETERIZED    Color, Urine STRAW (A) YELLOW   APPearance CLEAR CLEAR   Specific Gravity, Urine 1.006 1.005 - 1.030   pH 8.0 5.0 - 8.0   Glucose, UA NEGATIVE NEGATIVE mg/dL   Hgb urine dipstick MODERATE (A) NEGATIVE   Bilirubin Urine NEGATIVE NEGATIVE   Ketones, ur 5 (A) NEGATIVE mg/dL   Protein, ur 30 (A) NEGATIVE mg/dL   Nitrite NEGATIVE NEGATIVE   Leukocytes,Ua NEGATIVE NEGATIVE   RBC / HPF 6-10 0 - 5 RBC/hpf   WBC, UA 0-5 0 - 5 WBC/hpf    Comment:        Reflex urine culture not performed if WBC <=10, OR if Squamous epithelial cells >5. If Squamous epithelial cells >5 suggest recollection.    Bacteria, UA RARE (A) NONE SEEN   Squamous Epithelial / HPF 0-5 0 - 5 /HPF    Comment: Performed at [facility], [address], [place], [place] [address]  Troponin I (High Sensitivity)     Status: Abnormal   Collection Time: 12/31/22 10:32 PM  Result Value Ref Range   Troponin I (High Sensitivity) 33 (H) <18 ng/L    Comment: (NOTE) Elevated high sensitivity troponin I (hsTnI) values and significant  changes across serial measurements may suggest ACS but many other  chronic and acute conditions are known to elevate hsTnI results.  Refer to the "Links" section for chest pain algorithms and additional  guidance. Performed at [facility], [address], [place], [place] [address]   Brain natriuretic peptide     Status: Abnormal   Collection Time: 12/31/22 10:33 PM  Result Value Ref Range   B Natriuretic Peptide 496.1 (H) 0.0 - 100.0 pg/mL    Comment: Performed at [facility], [address], [place], [place] [address]  Troponin I (High Sensitivity)     Status: Abnormal   Collection Time: 12/31/22 11:20 PM  Result Value Ref Range   Troponin I (High Sensitivity) 33 (H) <18 ng/L    Comment: (NOTE) Elevated high  sensitivity troponin I (hsTnI) values and significant  changes across serial measurements may suggest ACS but many other  chronic and acute conditions are known to elevate hsTnI results.  Refer to the "Links" section for chest pain algorithms and additional  guidance. Performed at [facility], [address] [place], [place] [address]   I-Stat Lactic Acid, ED     Status: None   Collection Time: 12/31/22 11:53 PM  Result Value Ref Range   Lactic Acid, Venous 1.9 0.5 - 1.9 mmol/L   DG Chest Port 1 View  Result Date: 12/31/2022 CLINICAL DATA:  [id number] with weakness and confusion. EXAM: PORTABLE CHEST 1 VIEW COMPARISON:  Portable chest 03/17/2021 FINDINGS: The heart is moderately enlarged. Stable mediastinum with aortic tortuosity and calcific plaques. There is mild perihilar vascular congestion. There is mild interstitial consolidation similar to the prior study, difficult to say if this is due to interstitial edema or chronic interstitial lung disease but there is no significant pleural effusion. The lungs are emphysematous without evidence of focal infiltrates. There is osteopenia and chronic healed fracture deformity of the proximal right clavicle. No new osseous findings.  Overlying monitor wires. IMPRESSION: 1. Cardiomegaly with mild perihilar vascular congestion. 2. Mild interstitial consolidation similar to the prior study, difficult to say if this is due to interstitial edema or chronic interstitial  lung disease but there is no substantial pleural effusion. 3. Emphysema. 4. Aortic atherosclerosis. Electronically Signed   By: Almira Bar M.D.   On: 12/31/2022 21:22    Pending Labs Unresulted Labs (From admission, onward)     Start     Ordered   01/01/23 0500  CBC  Tomorrow morning,   R        01/01/23 0112   01/01/23 0500  Basic metabolic panel  Tomorrow morning,   R        01/01/23 0112   01/01/23 0500  TSH  Tomorrow morning,   R        01/01/23 0127   01/01/23  0114  Respiratory (~20 pathogens) panel by PCR  (Respiratory panel by PCR (~20 pathogens, ~24 hr TAT)  w precautions)  Once,   R        01/01/23 0113   01/01/23 0114  Gastrointestinal Panel by PCR , Stool  (Gastrointestinal Panel by PCR, Stool                                                                                                                                                     **Does Not include CLOSTRIDIUM DIFFICILE testing. **If CDIFF testing is needed, place order from the "C Difficile Testing" order set.**)  Once,   R        01/01/23 0113   12/31/22 2254  Urine Culture  Once,   URGENT       Question:  Indication  Answer:  Dysuria   12/31/22 2253   12/31/22 2023  Culture, blood (Routine x 2)  BLOOD CULTURE X 2,   R (with STAT occurrences)      12/31/22 2022            Vitals/Pain Today's Vitals   12/31/22 2248 12/31/22 2330 01/01/23 0007 01/01/23 0045  BP:  (!) 147/131  (!) 154/75  Pulse: (!) 121 (!) 117  (!) 109  Resp: (!) 23 18  (!) 24  Temp: (!) 101.5 F (38.6 C) 98.2 F (36.8 C) (!) 101.1 F (38.4 C)   TempSrc: Rectal  Rectal   SpO2: 100% 97%  98%  Weight:      Height:      PainSc:   2      Isolation Precautions Enteric precautions (UV disinfection)  Medications Medications  enoxaparin (LOVENOX) injection 40 mg (has no administration in time range)  0.9 %  sodium chloride infusion (has no administration in time range)  cefTRIAXone (ROCEPHIN) 2 g in sodium chloride 0.9 % 100 mL IVPB (has no administration in time range)  metroNIDAZOLE (FLAGYL) IVPB 500 mg (has no administration in time range)  aspirin EC tablet 81 mg (has no administration in time range)  ezetimibe (ZETIA) tablet 10 mg (has no administration in time range)  levETIRAcetam (KEPPRA)  IVPB 500 mg/100 mL premix (has no administration in time range)  acetaminophen (TYLENOL) tablet 650 mg (has no administration in time range)  metoprolol tartrate (LOPRESSOR) injection 2.5 mg (has no  administration in time range)  sodium chloride 0.9 % bolus 1,000 mL (0 mLs Intravenous Stopped 01/01/23 0007)  metoprolol tartrate (LOPRESSOR) injection 2.5 mg (2.5 mg Intravenous Given 12/31/22 2109)  metoprolol tartrate (LOPRESSOR) injection 2.5 mg (2.5 mg Intravenous Given 12/31/22 2208)  acetaminophen (TYLENOL) suppository 650 mg (650 mg Rectal Given 12/31/22 2301)  cefTRIAXone (ROCEPHIN) 2 g in sodium chloride 0.9 % 100 mL IVPB (0 g Intravenous Stopped 12/31/22 2338)    Mobility walks with device

## 2023-01-01 NOTE — Assessment & Plan Note (Signed)
-  unclear source. Presents with fever of 101.5 -negative CXR, UA -negative COVID/Flu/ RSV- test full respiratory viral panel -She has left lower extremity wound which family reports is almost healed and was last seen several days ago by wound care.  Family deferred removal of bandage for exam. -does not report symptoms other than chronic diarrhea that family is unsure if it is worse. Will obtain stool study. -will continue Rocephin and broaden with IV Flagyl as well  -pending blood culture -follow fever trend

## 2023-01-01 NOTE — Assessment & Plan Note (Signed)
-  normally on Bipap but will hold with acute metabolic encephalopathy

## 2023-01-01 NOTE — Plan of Care (Signed)

## 2023-01-01 NOTE — Assessment & Plan Note (Signed)
Continue Keppra through IV

## 2023-01-01 NOTE — Progress Notes (Signed)
Uses at home

## 2023-01-01 NOTE — Progress Notes (Signed)
   01/01/23 2210  BiPAP/CPAP/SIPAP  $ Non-Invasive Ventilator  Non-Invasive Vent Set Up  $ Non-Invasive Home Ventilator  Initial  $ Face Mask Small Yes  BiPAP/CPAP/SIPAP Pt Type Adult  BiPAP/CPAP/SIPAP DREAMSTATIOND  Mask Type Nasal mask  Mask Size Small  IPAP 10 cmH20  EPAP 5 cmH2O  FiO2 (%) 21 %  Patient Home Equipment No  Auto Titrate No  Nasal massage performed No (comment)

## 2023-01-01 NOTE — Assessment & Plan Note (Deleted)
-  unclear source. Presents with fever of 101.5 -negative CXR, UA -negative COVID/Flu/ RSV- test full respiratory viral panel -She has left lower extremity wound which family reports is almost healed and was last seen several days ago by wound care.  Family deferred removal of bandage for exam. -does not report symptoms other than chronic diarrhea that family is unsure if it is worse. Will obtain stool study. -will continue Rocephin and broaden with IV Flagyl as well  -pending blood culture -follow fever trend

## 2023-01-01 NOTE — Consult Note (Signed)
WOC Nurse Consult Note: patient had traumatic injury to L lower leg with significant skin tear; caregiver has been taking care of since May and has been followed at [facility] where they have been using collagen and Xeroform.  Reason for Consult: Left lower leg wound  Wound type: full thickness, traumatic  Pressure Injury POA: NA  Measurement: total area 8 cm x 3 cm healing full thickness wound with only 2 cm x 0.5 cm area that remains open, 100% pink moist  Wound bed: healing tissue with 1 open area as above  Drainage (amount, consistency, odor) minimal serosanguinous  Periwound: intact  Dressing procedure/placement/frequency: Clean left lower leg wound with NS, apply Xeroform to entire area every other day, cover with ABD pad and secure with Kerlix roll gauze beginning just above toes and ending right below knee.  Cover with Ace wrap in same fashion as Kerlix for some light compression.    Patient to resume care at wound care center at discharge.  POC discussed with caregiver who has done a phenomenal job with this wound.    WOC team will not follow.  Re-consult if further needs arise.    Thank you,    Priscella Mann MSN, RN-BC, [employer] [phone]

## 2023-01-01 NOTE — Plan of Care (Signed)
  Problem: Clinical Measurements: Goal: Respiratory complications will improve Outcome: Progressing   Problem: Education: Goal: Knowledge of General Education information will improve Description: Including pain rating scale, medication(s)/side effects and non-pharmacologic comfort measures Outcome: Not Progressing   Problem: Activity: Goal: Risk for activity intolerance will decrease Outcome: Not Progressing   Problem: Nutrition: Goal: Adequate nutrition will be maintained Outcome: Not Progressing

## 2023-01-01 NOTE — Evaluation (Signed)
Clinical/Bedside Swallow Evaluation Patient Details  Name: [name] MRN: [id number] Date of Birth: [birth year]  Today's Date: 01/01/2023 Time: SLP Start Time (ACUTE ONLY): 1120 SLP Stop Time (ACUTE ONLY): 1150 SLP Time Calculation (min) (ACUTE ONLY): 30 min  Past Medical History:  Past Medical History:  Diagnosis Date   Abdominal pain, other specified site    Allergic rhinitis    Anemia    Arthritis    CAD (coronary artery disease) 12/2017   s/p stent   Chronic kidney disease    stage 3 kidney disease per patient on 05/12/20   Diastolic CHF (HCC) 12/2017   grade 1   Essential hypertension, benign 06/17/2018   History of non-ST elevation myocardial infarction (NSTEMI) 06/17/2018   HLD (hyperlipidemia)    Hypertension    Irregular heart rate    Ischemic cardiomyopathy 06/17/2018   Myocardial infarction (HCC) 12/2017    [facility]   OSA (obstructive sleep apnea)    uses adaptive servo ventilation machine at night   Pneumonia    Seizure disorder (HCC)    last seizure in the 1950s-well controlled with keppra   Skin cancer    left arm   Past Surgical History:  Past Surgical History:  Procedure Laterality Date   BACK SURGERY     CARDIAC CATHETERIZATION  02/09/2000   CHOLECYSTECTOMY     EYE SURGERY Bilateral    cataracts removed   FEMUR IM NAIL Right 03/18/2021   Procedure: INTRAMEDULLARY (IM) NAIL FEMORAL;  Surgeon: Yolonda Kida, MD;  Location: [facility];  Service: Orthopedics;  Laterality: Right;   HARDWARE REMOVAL Right 05/14/2020   Procedure: HARDWARE REMOVAL RIGHT FEMUR;  Surgeon: Roby Lofts, MD;  Location: [facility];  Service: Orthopedics;  Laterality: Right;   KNEE SURGERY     laparoscopic   ORIF FEMUR FRACTURE Right 12/15/2019   Procedure: OPEN REDUCTION INTERNAL FIXATION (ORIF) DISTAL FEMUR FRACTURE;  Surgeon: Roby Lofts, MD;  Location: [facility];  Service: Orthopedics;  Laterality: Right;   TOTAL ABDOMINAL HYSTERECTOMY     TOTAL KNEE  ARTHROPLASTY Right 11/15/2020   Procedure: TOTAL KNEE ARTHROPLASTY;  Surgeon: Ollen Gross, MD;  Location: [facility];  Service: Orthopedics;  Laterality: Right;    WRIST FRACTURE SURGERY Bilateral    x 2   HPI:  Pt is a 89 y.o. female who presented on 12/31/2022 with weakness and confusion. MRI ordered to r/o stroke. Significant PMH includes chronic diastolic heart failure, HTN, phayrngeal dysphagia, OSA, and seizure disorder.    Assessment / Plan / Recommendation  Clinical Impression  Pt was seen by SLP for bedside swallow evaluation per RN and family observing pt coughing following POs. Pt has a hx of phayrngeal dysphagia and is acutely cognitively impaired. She was unable to follow either simple or complex commands (e.g., smile). Family reported that she had been on regular diet prior to this hospitalization. Pt presented with clinical s/sx of dysphagia. Consistencies adminstered: thin, nectar, puree, solid (minced, mechanical soft). Pt exhibited multiple swallows, audible swallow, and immediate weak coughing and throat clear following thin administration via cup and straw. No cough or throat clearing observed with nectar thick via cup, but pt continued to have an aduible swallow. Puree and minced was tolerated well, however prolongated mastication exhibited with mechanical soft. Dys 2 and nectar thick liquids reccmonded at this time. SLP will follow to attempt upgraded diet trials. SLP Visit Diagnosis: Dysphagia, unspecified (R13.10)    Aspiration Risk  Mild aspiration risk;Risk for  inadequate nutrition/hydration    Diet Recommendation Dysphagia 2 (Fine chop);Nectar-thick liquid    Liquid Administration via: Cup;Straw Medication Administration: Whole meds with puree Supervision: Full supervision/cueing for compensatory strategies Compensations: Slow rate;Small sips/bites Postural Changes: Seated upright at 90 degrees    Other  Recommendations Oral Care Recommendations: Oral care BID     Recommendations for follow up therapy are one component of a multi-disciplinary discharge planning process, led by the attending physician.  Recommendations may be updated based on patient status, additional functional criteria and insurance authorization.  Follow up Recommendations Skilled nursing-short term rehab (<3 hours/day)      Assistance Recommended at Discharge    Functional Status Assessment Patient has had a recent decline in their functional status and demonstrates the ability to make significant improvements in function in a reasonable and predictable amount of time.  Frequency and Duration min 2x/week  2 weeks       Prognosis Prognosis for improved oropharyngeal function: Fair Barriers to Reach Goals: Severity of deficits      Swallow Study   General Date of Onset: 12/31/22 HPI: Pt is a 89 y.o. female who presented on 12/31/2022 with weakness and confusion. MRI ordered to r/o stroke. Significant PMH includes chronic diastolic heart failure, HTN, phayrngeal dysphagia, OSA, and seizure disorder. Type of Study: Bedside Swallow Evaluation Previous Swallow Assessment: Modified barium swallow 2017: DYS 2, Nectar Diet Prior to this Study: Regular Temperature Spikes Noted: Yes (9/8 22:00 101 degrees) Respiratory Status: Room air History of Recent Intubation: No Behavior/Cognition: Alert;Cooperative Oral Cavity Assessment: Within Functional Limits Oral Care Completed by SLP: No Oral Cavity - Dentition: Dentures, top Vision: Impaired for self-feeding Self-Feeding Abilities: Needs assist Patient Positioning: Upright in bed Baseline Vocal Quality: Low vocal intensity Volitional Swallow: Able to elicit    Oral/Motor/Sensory Function Overall Oral Motor/Sensory Function: Other (comment) (Limitation to assessment: cognition, pt not following commands)   Ice Chips Ice chips: Not tested   Thin Liquid Thin Liquid: Impaired Presentation: Cup;Self Fed;Straw Pharyngeal  Phase  Impairments: Multiple swallows;Cough - Immediate;Throat Clearing - Immediate    Nectar Thick Nectar Thick Liquid: Within functional limits Presentation: Cup   Honey Thick     Puree Puree: Within functional limits Presentation: Spoon   Solid     Solid: Impaired Presentation: Spoon Oral Phase Impairments: Impaired mastication (prolonged mastication) Pharyngeal Phase Impairments: Throat Clearing - Delayed;Multiple swallows     Marline Backbone, B.S., Speech Therapy Student

## 2023-01-01 NOTE — Progress Notes (Addendum)
At approx 2135 this RN went to administer PRN tylenol PO for mild fever. Pt had reflexive coughing after small bite of applesauce. Pt requested nectar thickened cranberry juice to clear throat and had another series of reflexive coughing.Concerned for aspiration, this RN held PO tylenol and requested NP for rectal suppository. Pt will be held NPO until ST assessment in the AM. Of note, Pt family assisted with dinner feeding and stated no reflexive coughing of dysphagia 2 diet or nectar liquids.  Subsequent to potential aspiration event, Pt noted to have elevated BP and Temp 100.9/rectal. Yellow Mews initiated with q4 VS. Tylenol ordered and given rectally. NP, RR, Charge RN all notified.

## 2023-01-01 NOTE — Assessment & Plan Note (Signed)
-  lactic acid of 2.3. Resolved with IV fluids.

## 2023-01-01 NOTE — Assessment & Plan Note (Signed)
-  appears dehydrated on exam -keep on continuous IV fluid

## 2023-01-01 NOTE — Assessment & Plan Note (Addendum)
-  Unclear if she has previous diagnosis of this. No records found in epic -presented with atrial flutter/fibrillation HR 130. Improved after several dose of Lopressor in ED -Keep on continuous telemetry  -CH2AD2VASc score of 5- discuss with family friend who deferred anticoagulation discuss to when pt clinically improves and can make decision for herself. However given her age risk of anticoagulation likely outweight risk. Will hold on anticoagulation.  -obtain echocardiogram. Last echocardiogram in 11/2019 with a EF of 60 to 65%, mild LVH, indeterminate diastolic function.  Trivial MV regurgitation, mild aortic valve sclerosis. -check TSH -Goal HR <100. PRN Lopressor with perimeters.

## 2023-01-01 NOTE — Assessment & Plan Note (Signed)
Mild. Keep on continuous IV fluid overnight.

## 2023-01-02 ENCOUNTER — Inpatient Hospital Stay (HOSPITAL_COMMUNITY): Payer: Medicare Other

## 2023-01-02 ENCOUNTER — Other Ambulatory Visit: Payer: Self-pay

## 2023-01-02 DIAGNOSIS — R6510 Systemic inflammatory response syndrome (SIRS) of non-infectious origin without acute organ dysfunction: Secondary | ICD-10-CM | POA: Diagnosis not present

## 2023-01-02 DIAGNOSIS — R509 Fever, unspecified: Secondary | ICD-10-CM | POA: Diagnosis not present

## 2023-01-02 DIAGNOSIS — G40909 Epilepsy, unspecified, not intractable, without status epilepticus: Secondary | ICD-10-CM

## 2023-01-02 LAB — GLUCOSE, CAPILLARY
Glucose-Capillary: 101 mg/dL — ABNORMAL HIGH (ref 70–99)
Glucose-Capillary: 127 mg/dL — ABNORMAL HIGH (ref 70–99)

## 2023-01-02 LAB — BASIC METABOLIC PANEL
Anion gap: 15 (ref 5–15)
BUN: 17 mg/dL (ref 8–23)
CO2: 19 mmol/L — ABNORMAL LOW (ref 22–32)
Calcium: 8.5 mg/dL — ABNORMAL LOW (ref 8.9–10.3)
Chloride: 98 mmol/L (ref 98–111)
Creatinine, Ser: 0.63 mg/dL (ref 0.44–1.00)
GFR, Estimated: >60 mL/min (ref >60)
Glucose, Bld: 110 mg/dL — ABNORMAL HIGH (ref 70–99)
Potassium: 4.2 mmol/L (ref 3.5–5.1)
Sodium: 132 mmol/L — ABNORMAL LOW (ref 135–145)

## 2023-01-02 LAB — CBC
HCT: 42.7 % (ref 36.0–46.0)
Hemoglobin: 14 g/dL (ref 12.0–15.0)
MCH: 29.4 pg (ref 26.0–34.0)
MCHC: 32.8 g/dL (ref 30.0–36.0)
MCV: 89.7 fL (ref 80.0–100.0)
Platelets: 125 10*3/uL — ABNORMAL LOW (ref 150–400)
RBC: 4.76 MIL/uL (ref 3.87–5.11)
RDW: 15.4 % (ref 11.5–15.5)
WBC: 9.4 10*3/uL (ref 4.0–10.5)
nRBC: 0 % (ref 0.0–0.2)

## 2023-01-02 LAB — URINE CULTURE

## 2023-01-02 LAB — MAGNESIUM: Magnesium: 1.9 mg/dL (ref 1.7–2.4)

## 2023-01-02 LAB — C DIFFICILE QUICK SCREEN W PCR REFLEX
C Diff antigen: NEGATIVE
C Diff interpretation: NOT DETECTED
C Diff toxin: NEGATIVE

## 2023-01-02 MED ORDER — ACETAMINOPHEN 650 MG RE SUPP
650.0000 mg | Freq: Once | RECTAL | Status: AC
  Administered 2023-01-02: 650 mg via RECTAL
  Filled 2023-01-02: qty 1

## 2023-01-02 MED ORDER — POTASSIUM CHLORIDE CRYS ER 20 MEQ PO TBCR: 40.0000 meq | EXTENDED_RELEASE_TABLET | Freq: Once | ORAL | Status: DC

## 2023-01-02 MED ORDER — DEXTROSE 5 % IV SOLN
10.0000 mg/kg | Freq: Two times a day (BID) | INTRAVENOUS | Status: DC
  Administered 2023-01-02 – 2023-01-05 (×6): 485 mg via INTRAVENOUS
  Filled 2023-01-02 (×7): qty 9.7

## 2023-01-02 MED ORDER — SODIUM CHLORIDE 0.9 % IV SOLN
2.0000 g | Freq: Four times a day (QID) | INTRAVENOUS | Status: DC
  Administered 2023-01-02 – 2023-01-05 (×11): 2 g via INTRAVENOUS
  Filled 2023-01-02 (×14): qty 2000

## 2023-01-02 MED ORDER — SODIUM CHLORIDE 0.9% FLUSH: 10.0000 mL | INTRAVENOUS | Status: DC | PRN

## 2023-01-02 MED ORDER — ASPIRIN 81 MG PO CHEW: 81.0000 mg | CHEWABLE_TABLET | Freq: Every day | ORAL | Status: DC

## 2023-01-02 MED ORDER — DEXTROSE IN LACTATED RINGERS 5 % IV SOLN: INTRAVENOUS | Status: DC

## 2023-01-02 MED ORDER — ENOXAPARIN SODIUM 30 MG/0.3ML IJ SOSY: 30.0000 mg | PREFILLED_SYRINGE | INTRAMUSCULAR | Status: DC

## 2023-01-02 MED ORDER — VANCOMYCIN HCL IN DEXTROSE 1-5 GM/200ML-% IV SOLN
1000.0000 mg | Freq: Once | INTRAVENOUS | Status: DC
  Filled 2023-01-02: qty 200

## 2023-01-02 MED ORDER — HYDRALAZINE HCL 20 MG/ML IJ SOLN
10.0000 mg | Freq: Once | INTRAMUSCULAR | Status: AC
  Administered 2023-01-02: 10 mg via INTRAVENOUS
  Filled 2023-01-02: qty 1

## 2023-01-02 MED ORDER — SODIUM CHLORIDE 0.9% FLUSH
10.0000 mL | Freq: Two times a day (BID) | INTRAVENOUS | Status: DC
  Administered 2023-01-02 – 2023-01-05 (×5): 10 mL

## 2023-01-02 MED ORDER — VANCOMYCIN HCL 750 MG/150ML IV SOLN
750.0000 mg | INTRAVENOUS | Status: DC
  Administered 2023-01-03 – 2023-01-04 (×2): 750 mg via INTRAVENOUS
  Filled 2023-01-02 (×3): qty 150

## 2023-01-02 MED ORDER — ACETAMINOPHEN 650 MG RE SUPP
650.0000 mg | RECTAL | Status: DC | PRN
  Administered 2023-01-04: 650 mg via RECTAL
  Filled 2023-01-02 (×2): qty 1

## 2023-01-02 MED ORDER — VANCOMYCIN HCL IN DEXTROSE 1-5 GM/200ML-% IV SOLN
1000.0000 mg | Freq: Once | INTRAVENOUS | Status: AC
  Administered 2023-01-02: 1000 mg via INTRAVENOUS
  Filled 2023-01-02: qty 200

## 2023-01-02 MED ORDER — SODIUM CHLORIDE 0.9 % IV SOLN
2.0000 g | Freq: Two times a day (BID) | INTRAVENOUS | Status: DC
  Administered 2023-01-03 – 2023-01-05 (×6): 2 g via INTRAVENOUS
  Filled 2023-01-02 (×7): qty 20

## 2023-01-02 MED ORDER — VANCOMYCIN HCL 750 MG/150ML IV SOLN: 750.0000 mg | INTRAVENOUS | Status: DC

## 2023-01-02 MED ORDER — HYDRALAZINE HCL 20 MG/ML IJ SOLN
10.0000 mg | INTRAMUSCULAR | Status: DC | PRN
  Administered 2023-01-04 – 2023-01-05 (×3): 10 mg via INTRAVENOUS
  Filled 2023-01-02 (×3): qty 1

## 2023-01-02 MED ORDER — KETOROLAC TROMETHAMINE 15 MG/ML IJ SOLN
15.0000 mg | Freq: Three times a day (TID) | INTRAMUSCULAR | Status: DC | PRN
  Administered 2023-01-02: 15 mg via INTRAVENOUS
  Filled 2023-01-02: qty 1

## 2023-01-02 MED ORDER — LEVETIRACETAM IN NACL 500 MG/100ML IV SOLN
500.0000 mg | Freq: Two times a day (BID) | INTRAVENOUS | Status: DC
  Administered 2023-01-02 – 2023-01-05 (×7): 500 mg via INTRAVENOUS
  Filled 2023-01-02 (×7): qty 100

## 2023-01-02 MED ORDER — POTASSIUM CHLORIDE 20 MEQ PO PACK
40.0000 meq | PACK | Freq: Once | ORAL | Status: DC
  Filled 2023-01-02: qty 2

## 2023-01-02 MED ORDER — NITROGLYCERIN 2 % TD OINT
1.0000 [in_us] | TOPICAL_OINTMENT | Freq: Four times a day (QID) | TRANSDERMAL | Status: DC
  Administered 2023-01-02 – 2023-01-05 (×11): 1 [in_us] via TOPICAL
  Filled 2023-01-02 (×2): qty 30

## 2023-01-02 MED ORDER — CLONIDINE HCL 0.1 MG/24HR TD PTWK
0.1000 mg | MEDICATED_PATCH | TRANSDERMAL | Status: DC
  Administered 2023-01-02: 0.1 mg via TRANSDERMAL
  Filled 2023-01-02: qty 1

## 2023-01-02 MED ORDER — ACETAMINOPHEN 325 MG PO TABS: 650.0000 mg | ORAL_TABLET | ORAL | Status: DC | PRN

## 2023-01-02 NOTE — Progress Notes (Signed)
    Referral received for [name] :goals of care discussion. Chart reviewed and updates received from RN upon arrival to 3W. Patient assessed and is unable to engage appropriately in discussions.   Attempted to contact patient's son [name]. Unable to reach. Voicemail left with contact information given.   PMT will re-attempt to contact family at a later time/date. Detailed note and recommendations to follow once GOC has been completed.   Thank you for your referral and allowing PMT to assist in Mrs. [name]'s care.   Richardson Dopp, [facility] Palliative Medicine Team  Team Phone # [phone]   NO CHARGE

## 2023-01-02 NOTE — Consult Note (Signed)
Neurology Consultation Reason for Consult: Acute encephalopathy Requesting Physician: Dr. Allena Katz  CC: Patient is unable to provide chief complaint due to altered mental status, confusion  History is obtained from: Chart review, unable to obtain from patient due to acute confusion   HPI: [name] is a 89 y.o. female with a medical history significant for chronic diastolic heart failure, HTN, OSA on CPAP, seizure disorder on home Keppra (previously on Dilantin but this was discontinued due to Dilantin toxicity with subsequent dizziness and recurrent falls in 2020) who presented to the ED on 9/8 for evaluation of fatigue, weakness, and confusion.  Patient initially began to complain of increased fatigue on 9/7 and on 9/8 family noted that patient was acutely confused and weak as she is unable to get herself up to the restroom. On arrival to the ED, the patient was noted to only be answering "yes" or "no" questions without further verbalization.  Initial workup at [facility] revealed febrile patient to 101.5 F, tachycardia, and hypertensive blood pressures with SBP up to 204 mmHg.  CBC was without leukocytosis.  UA negative for acute UTI.  Respiratory panel unremarkable.  EEG revealed cortical dysfunction of the left hemisphere consistent with structural abnormality versus postictal state with additional mild diffuse encephalopathy.  Due to neurologic deficits on exam 9/9 including aphasia, anomia, and left gaze preference in the setting of a negative MRI, patient was transferred to [facility] hospital for long-term EEG monitoring and further neurologic work up and evaluation.   At baseline, patient has no reported cognitive impairment and lives with her son.  She reportedly is very sharp and is independent with her ADLs.  Patient's son notes that she vacuums the house on a daily basis in her usual state of health.  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  It is unclear when/why keppra was changed from 500 BID to 500 daily but she has been tolerating this dose well for a long time now; this medication is currently managed by her PCP  Prior AEDS: Dilantin -- stopped due to side effects Vimpat -- stopped to due to dizziness   ROS:  Unable to obtain due to altered mental status, confusion, and aphasia.   Past Medical History:  Diagnosis Date   Abdominal pain, other specified site    Allergic rhinitis    Anemia    Arthritis    CAD (coronary artery disease) 12/2017   s/p stent   Chronic kidney disease    stage 3 kidney disease per patient on 05/12/20   Diastolic CHF (HCC) 12/2017   grade 1   Essential hypertension, benign 06/17/2018   History of non-ST elevation myocardial infarction (NSTEMI) 06/17/2018   HLD (hyperlipidemia)    Hypertension    Irregular heart rate    Ischemic cardiomyopathy 06/17/2018   Myocardial infarction (HCC) 12/2017    [facility]   OSA (obstructive sleep apnea)    uses adaptive servo ventilation machine at night   Pneumonia    Seizure disorder (HCC)    last seizure in the 1950s-well controlled with keppra   Skin cancer    left arm   Past Surgical History:  Procedure Laterality Date   BACK SURGERY     CARDIAC CATHETERIZATION  02/09/2000   CHOLECYSTECTOMY     EYE  SURGERY Bilateral    cataracts removed   FEMUR IM NAIL Right 03/18/2021   Procedure: INTRAMEDULLARY (IM) NAIL FEMORAL;  Surgeon: Yolonda Kida, MD;  Location: [facility];  Service: Orthopedics;  Laterality: Right;   HARDWARE REMOVAL Right 05/14/2020   Procedure: HARDWARE REMOVAL RIGHT FEMUR;  Surgeon: Roby Lofts, MD;  Location: [facility] OR;  Service: Orthopedics;  Laterality: Right;   KNEE SURGERY     laparoscopic   ORIF FEMUR FRACTURE Right 12/15/2019   Procedure: OPEN REDUCTION INTERNAL FIXATION  (ORIF) DISTAL FEMUR FRACTURE;  Surgeon: Roby Lofts, MD;  Location: [facility] OR;  Service: Orthopedics;  Laterality: Right;   TOTAL ABDOMINAL HYSTERECTOMY     TOTAL KNEE ARTHROPLASTY Right 11/15/2020   Procedure: TOTAL KNEE ARTHROPLASTY;  Surgeon: Ollen Gross, MD;  Location: [facility];  Service: Orthopedics;  Laterality: Right;    WRIST FRACTURE SURGERY Bilateral    x 2   Family History  Problem Relation Age of Onset   Heart disease Father    Skin cancer Father    Skin cancer Mother    Heart attack Mother    Stroke Sister    Congenital heart disease Niece    Renal Disease Niece    Social History:  reports that she has never smoked. She has never used smokeless tobacco. She reports that she does not drink alcohol and does not use drugs.  Exam: Current vital signs: BP (!) 164/94 (BP Location: Left Arm)   Pulse (!) 104   Temp 99.6 F (37.6 C) (Rectal)   Resp 18   Ht 4\' 10"  (1.473 m)   Wt 48.3 kg   LMP  (LMP Unknown)   SpO2 100%   BMI 22.25 kg/m  Vital signs in last 24 hours: Temp:  [98.2 F (36.8 C)-101.5 F (38.6 C)] 99.6 F (37.6 C) (09/10 0858) Pulse Rate:  [68-104] 104 (09/10 0400) Resp:  [15-25] 18 (09/10 0858) BP: (152-204)/(73-124) 164/94 (09/10 0858) SpO2:  [96 %-100 %] 100 % (09/10 0858) FiO2 (%):  [21 %] 21 % (09/09 2338)  Physical Exam  Constitutional: Thin Caucasian female, acutely ill-appearing Psych: Patient is not cooperative with exam, she was acutely confused Eyes: No scleral injection HENT: No oropharyngeal obstruction.  MSK: Left lower extremity has a gauze wrap bandage in place without notable drainage Cardiovascular: Sinus tachycardia, extremities are warm Respiratory: Effort normal, non-labored breathing on room air GI: Soft.  No distension. There is no tenderness.  Skin: Warm dry and intact visible skin  Neuro: Mental Status: Patient is asleep initially, she wakes to voice.  Patient answers "yes" quietly to all orientation questions.  She  does not attempt to name objects or repeat phrases.  Patient does not follow commands. Cranial Nerves: II: PERRL III,IV, VI: EOMI, appears at times to have a left gaze with leftward head turn but with passive head turn rightward, her eyes look fully rightward as well V: symmetric sensation to eyelash brush VII: Facial movement is symmetric.  VIII: Hearing is intact to voice X: Patient is hypophonic XI: Head is turned leftward on exam XII: Does not protrude tongue to command Motor/Sensory: Tone is normal. Bulk is decreased though appropriate for age. Bilateral upper extremities will elevate antigravity briefly.  Bilateral lower extremities withdrawal to light stimuli with patient grimace Cerebellar: Does not perform  Gait:  Deferred for patient safety in the setting of acute alteration in mental status  I have reviewed labs in epic and the results pertinent to this consultation are:  CBC    Component Value Date/Time   WBC 9.4 01/02/2023 0821   RBC 4.76 01/02/2023 0821   HGB 14.0 01/02/2023 0821   HCT 42.7 01/02/2023 0821   PLT 125 (L) 01/02/2023 0821   MCV 89.7 01/02/2023 0821   MCH 29.4 01/02/2023 0821   MCHC 32.8 01/02/2023 0821   RDW 15.4 01/02/2023 0821   LYMPHSABS 1.0 12/31/2022 2032   MONOABS 0.6 12/31/2022 2032   EOSABS 0.0 12/31/2022 2032   BASOSABS 0.0 12/31/2022 2032   CMP     Component Value Date/Time   NA 132 (L) 01/02/2023 0821   K 4.2 01/02/2023 0821   CL 98 01/02/2023 0821   CO2 19 (L) 01/02/2023 0821   GLUCOSE 110 (H) 01/02/2023 0821   BUN 17 01/02/2023 0821   CREATININE 0.63 01/02/2023 0821   CALCIUM 8.5 (L) 01/02/2023 0821   PROT 8.3 (H) 12/31/2022 2032   ALBUMIN 4.6 12/31/2022 2032   AST 32 12/31/2022 2032   ALT 19 12/31/2022 2032   ALKPHOS 95 12/31/2022 2032   BILITOT 1.3 (H) 12/31/2022 2032   GFRNONAA >60 01/02/2023 0821   Urinalysis    Component Value Date/Time   COLORURINE STRAW (A) 12/31/2022 2131   APPEARANCEUR CLEAR 12/31/2022 2131    LABSPEC 1.006 12/31/2022 2131   PHURINE 8.0 12/31/2022 2131   GLUCOSEU NEGATIVE 12/31/2022 2131   HGBUR MODERATE (A) 12/31/2022 2131   BILIRUBINUR NEGATIVE 12/31/2022 2131   KETONESUR 5 (A) 12/31/2022 2131   PROTEINUR 30 (A) 12/31/2022 2131   NITRITE NEGATIVE 12/31/2022 2131   LEUKOCYTESUR NEGATIVE 12/31/2022 2131   Drugs of Abuse  No results found for: "LABOPIA", "COCAINSCRNUR", "LABBENZ", "AMPHETMU", "THCU", "LABBARB"   Results for orders placed or performed during the hospital encounter of 12/31/22  Culture, blood (Routine x 2)     Status: None (Preliminary result)   Collection Time: 12/31/22  8:25 PM   Specimen: BLOOD  Result Value Ref Range Status   Specimen Description   Final    BLOOD Performed at [facility], [address], [place], [place] [address]    Special Requests   Final    Blood Culture adequate volume BOTTLES DRAWN AEROBIC AND ANAEROBIC Performed at [facility], [address] [place], [place] [address]    Culture   Final    NO GROWTH 1 DAY Performed at [facility], [address], [place], [place] [address]    Report Status PENDING  Incomplete  Culture, blood (Routine x 2)     Status: None (Preliminary result)   Collection Time: 12/31/22  8:32 PM   Specimen: BLOOD  Result Value Ref Range Status   Specimen Description   Final    BLOOD RIGHT ANTECUBITAL Performed at [facility], [address], [place], [place] [address]    Special Requests   Final    Blood Culture adequate volume BOTTLES DRAWN AEROBIC AND ANAEROBIC Performed at [facility], [address] [place], [place] [address]    Culture   Final    NO GROWTH 1 DAY Performed at [facility], [address] [place], [place] [address]    Report Status PENDING  Incomplete  Resp panel by RT-PCR (RSV, Flu A&B, Covid) Anterior Nasal Swab     Status: None   Collection Time: 12/31/22  8:45 PM   Specimen:  Anterior Nasal Swab  Result Value Ref Range Status   SARS Coronavirus 2 by RT PCR NEGATIVE  NEGATIVE Final    Comment: (NOTE) SARS-CoV-2 target nucleic acids are NOT DETECTED.  The SARS-CoV-2 RNA is generally detectable in upper respiratory specimens during the acute phase of infection. The lowest concentration of SARS-CoV-2 viral copies this assay can detect is 138 copies/mL. A negative result does not preclude SARS-Cov-2 infection and should not be used as the sole basis for treatment or other patient management decisions. A negative result may occur with  improper specimen collection/handling, submission of specimen other than nasopharyngeal swab, presence of viral mutation(s) within the areas targeted by this assay, and inadequate number of viral copies(<138 copies/mL). A negative result must be combined with clinical observations, patient history, and epidemiological information. The expected result is Negative.  Fact Sheet for Patients:  BloggerCourse.com  Fact Sheet for Healthcare Providers:  SeriousBroker.it  This test is no t yet approved or cleared by the [place] FDA and  has been authorized for detection and/or diagnosis of SARS-CoV-2 by FDA under an Emergency Use Authorization (EUA). This EUA will remain  in effect (meaning this test can be used) for the duration of the COVID-19 declaration under Section 564(b)(1) of the Act, 21 U.S.C.section 360bbb-3(b)(1), unless the authorization is terminated  or revoked sooner.       Influenza A by PCR NEGATIVE NEGATIVE Final   Influenza B by PCR NEGATIVE NEGATIVE Final    Comment: (NOTE) The Xpert Xpress SARS-CoV-2/FLU/RSV plus assay is intended as an aid in the diagnosis of influenza from Nasopharyngeal swab specimens and should not be used as a sole basis for treatment. Nasal washings and aspirates are unacceptable for Xpert Xpress SARS-CoV-2/FLU/RSV testing.  Fact  Sheet for Patients: BloggerCourse.com  Fact Sheet for Healthcare Providers: SeriousBroker.it  This test is not yet approved or cleared by the [place] FDA and has been authorized for detection and/or diagnosis of SARS-CoV-2 by FDA under an Emergency Use Authorization (EUA). This EUA will remain in effect (meaning this test can be used) for the duration of the COVID-19 declaration under Section 564(b)(1) of the Act, 21 U.S.C. section 360bbb-3(b)(1), unless the authorization is terminated or revoked.     Resp Syncytial Virus by PCR NEGATIVE NEGATIVE Final    Comment: (NOTE) Fact Sheet for Patients: BloggerCourse.com  Fact Sheet for Healthcare Providers: SeriousBroker.it  This test is not yet approved or cleared by the [place] FDA and has been authorized for detection and/or diagnosis of SARS-CoV-2 by FDA under an Emergency Use Authorization (EUA). This EUA will remain in effect (meaning this test can be used) for the duration of the COVID-19 declaration under Section 564(b)(1) of the Act, 21 U.S.C. section 360bbb-3(b)(1), unless the authorization is terminated or revoked.  Performed at [facility], [address], [place], [place] [address]   Urine Culture     Status: Abnormal   Collection Time: 12/31/22  9:31 PM   Specimen: Urine, Catheterized  Result Value Ref Range Status   Specimen Description   Final    URINE, CATHETERIZED Performed at [facility], [address] [place], [place] [address]    Special Requests   Final    NONE Performed at [facility], [address], [place], [place] [address]    Culture MULTIPLE SPECIES PRESENT, SUGGEST RECOLLECTION (A)  Final   Report Status 01/02/2023 FINAL  Final  Respiratory (~20 pathogens) panel by PCR     Status: None   Collection Time: 01/01/23  2:41 AM    Specimen: Nasopharyngeal Swab; Respiratory  Result Value Ref Range Status   Adenovirus NOT DETECTED NOT DETECTED Final   Coronavirus 229E NOT DETECTED NOT DETECTED Final    Comment: (NOTE) The Coronavirus on the Respiratory Panel, DOES NOT test for the novel  Coronavirus (2019 nCoV)    Coronavirus HKU1 NOT DETECTED NOT DETECTED Final   Coronavirus NL63 NOT DETECTED NOT DETECTED Final   Coronavirus OC43 NOT DETECTED NOT DETECTED Final   Metapneumovirus NOT DETECTED NOT DETECTED Final   Rhinovirus / Enterovirus NOT DETECTED NOT DETECTED Final   Influenza A NOT DETECTED NOT DETECTED Final   Influenza B NOT DETECTED NOT DETECTED Final   Parainfluenza Virus 1 NOT DETECTED NOT DETECTED Final   Parainfluenza Virus 2 NOT DETECTED NOT DETECTED Final   Parainfluenza Virus 3 NOT DETECTED NOT DETECTED Final   Parainfluenza Virus 4 NOT DETECTED NOT DETECTED Final   Respiratory Syncytial Virus NOT DETECTED NOT DETECTED Final   Bordetella pertussis NOT DETECTED NOT DETECTED Final   Bordetella Parapertussis NOT DETECTED NOT DETECTED Final   Chlamydophila pneumoniae NOT DETECTED NOT DETECTED Final   Mycoplasma pneumoniae NOT DETECTED NOT DETECTED Final    Comment: Performed at [facility], [address] [place], [place] [address]  C Difficile Quick Screen w PCR reflex     Status: None   Collection Time: 01/02/23  6:07 AM   Specimen: Stool  Result Value Ref Range Status   C Diff antigen NEGATIVE NEGATIVE Final   C Diff toxin NEGATIVE NEGATIVE Final   C Diff interpretation No C. difficile detected.  Final    Comment: Performed at [facility], [address], [place], [place] [address]   I have reviewed the images obtained: MRI brain 01/01/23: No acute or reversible finding. Moderate chronic small-vessel ischemic changes of the pons and cerebral hemispheric white matter.   Routine EEG 01/01/23: "This study is cortical dysfunction arising from left hemisphere likely secondary  to underlying structural abnormality, post-ictal state. Additionally there is mild diffuse encephalopathy, nonspecific etiology. No seizures or definite epileptiform discharges were seen throughout the recording."  Impression: 89 y.o. female with PMHx of chronic diastolic heart failure, HTN, OSA on CPAP, and seizure disorder on home Keppra who presented to the ED on 9/8 for evaluation of acute alteration in mental status with fatigue and confusion and was found to be septic.  Patient had ongoing neurologic deficits this morning including aphasia, anomia, and concern for a new left gaze preference.  Due to ongoing neurologic deficits in the setting of sepsis and a negative MRI, patient was transferred to [facility] for further neurology workup and LTM EEG monitoring.  - Examination reveals patient that does not follow commands, answers "yes" to all examiner questions, and that is drowsy.  Per family at bedside, patient is acutely altered from baseline. - Initial work up concerning for fever of 101.5 F, tachycardia, and hypertensive blood pressures with SBP up to 204 mmHg. EEG findings concerning for cortical dysfunction of the left hemisphere consistent with structural abnormality versus postictal state and mild diffuse encephalopathy.  Blood cultures 1 of 4 growing gram-positive rods. MRI brain negative for acute process. -Differential diagnoses of acute encephalopathy are broad including toxic-metabolic / infectious encephalopathy (positive blood culture 1/4 for gram positive rods), meningitis/encephalitis, or seizure vs. Status epilepticus in a patient with a history of TBI and subsequent seizures. Will place patient on overnight EEG monitoring and obtain CSF for diagnostic testing.   Recommendations: - LTM EEG monitoring - Seizure precautions -  Continue Keppra 500 mg daily, IV or PO as patient tolerates - Patient will need an LP with CSF studies tomorrow as last dose of lovenox was administered this  morning (consent on chart) - Hold AM dose of lovenox - CSF: culture with gram stain, cell count with differential, protein, glucose, meningitis/encephalitis panel, - Serum RPR, B1, B12, HIV for reversible causes workup - Monitor fever curve and WBC - Continue infectious work up and treatment as you are - Agree with meningitis coverage antibiotics - Neurology will continue to follow   Lanae Boast, AGACNP-BC [facility] Pager: [phone]  Attending Neurologist's note:  I personally saw this patient, gathering history, performing a full neurologic examination, reviewing relevant labs, personally reviewing relevant imaging including MRI brain, and formulated the assessment and plan, adding the note above for completeness and clarity to accurately reflect my thoughts    Brooke Dare MD-PhD [facility] [phone] Available 7 AM to 7 PM, outside these hours please contact Neurologist on call listed on AMION

## 2023-01-02 NOTE — Hospital Course (Addendum)
Brief hospital course: PMH of HTN, seizures, OSA on BiPAP, chronic diastolic CHF presented to hospital with complaints of generalized weakness. Brought in on 9/8 with weakness and confusion.  At baseline patient is alert awake and oriented x 4, independent for mobility. Had some gradual generalized weakness for last 2 days prior to admission and presented with disorientation.  Repeating the same sentence again and again unable to identify family members.  No fall no trauma no injury reported. Admitted for a possible UTI. Due to encephalopathy as well as focal deficit MRI brain was performed which was negative for any acute stroke. EEG showed concern for postictal state.  Discussed with neurology.  Patient will be transferred to [facility] for LTM.  LP recommended to rule out meningitis. Antibiotic coverage changed to meningitis coverage. Palliative care consulted. Echo showed EF 45%.  Telemetry confirmed flutter waves.  Discussed with cardiology conservative management recommended for now.  Assessment and Plan: Severe sepsis.  Source of infection identified. Meeting SIRS criteria with fever, tachycardia and encephalopathy. Initially thought to be a UTI but chest x-ray and UA unremarkable. Urine culture grew multiple species. Blood cultures 1 out of 4 positive for gram-positive rods (concern for Listeria). COVID-negative flu negative RSV negative.  RVP negative as well. She has left lower extremity wound which family reports is almost healed and was last seen several days ago by wound care. Has chronic diarrhea.  C. difficile negative BMs are now solid. Treated with IV Rocephin and Flagyl. Given concern for encephalopathy and seizures antibiotic coverage broadened to include meningitis therapy with IV vancomycin, IV ampicillin, IV ceftriaxone as well as IV acyclovir. Repeat blood cultures tomorrow. Initiating IV hydration.  Acute encephalopathy Concern for meningitis/encephalitis History of  seizures with concern for active seizures. Per family the patient is independent at her baseline, very sharp without any significant confusion. Presents with slurred speech and sleepiness. Unable to identify family members, not oriented, unable to swallow safely, unable to identify objects. Repeating same words again again. Unable to follow complex commands. No focal deficit in motor strength, withdraws to pain. MRI brain negative for any acute stroke. EEG concerning for left hemispheric cortical dysfunction and encephalopathy. Patient is on Keppra 500 mg daily at home. Dose currently increased to 400 mg twice daily. Discussed with neurology. Requested LTM EEG monitoring. Neurology also recommended LP to rule out meningitis. Patient to be transferred to [facility].  Further workup and treatment deferred to neurology.  Dysphagia. Has prior history of dysphagia.  But was eating normal food prior to coming to the hospital. Currently unable to swallow safely. Speech evaluated on 9/9 and was recommended to have D2 nectar thick liquid diet. Will request speech reevaluation. Currently holding or hold oral medication. Family does not want to proceed with feeding tube.   New onset atrial flutter with RVR New onset acute systolic dysfunction with prior history of chronic diastolic CHF, likely cardiomyopathy from tachycardia No prior history. Discussed with cardiology. Initially concern for atrial fibrillation although EKG does not show any evidence of A-fib and clear P waves were seen. After monitoring on the telemetry and the patient appears to have atrial flutter. Her rate is currently controlled. Echocardiogram shows EF of 40 to 45%. Global wall motion abnormality. Discussed with the patient's cardiologist Dr. Rosemary Holms, recommend conservative measures as long as rate is controlled. Suspect A- flutter as well as cardiomyopathy both are incidental to infection and acute  stress. And recommended to treat the underlying condition. Recommend anticoagulation if there  is no contraindication with Eliquis when the patient is able to swallow safely. Continue as needed Lopressor. Volume level currently adequate.  Actively receiving IV hydration.  Monitor for volume overload.   Lactic acidosis Mild. Resolved.  Goals of care conversation. Per family patient is highly functional 89 year old with stable medical condition who presented with sudden change in her mental state secondary to fever/seizures/hypertensive emergency. At present the patient is encephalopathic, unable to swallow safely found to have a flutter with RVR and systolic dysfunction/cardiomyopathy. Family's goal is clear.  They do not want to put the patient through unnecessary tests or procedures but would like to treat reversible conditions. Patient has a DNR. I have given them a copy of MOST form. They do not want feeding tube. I have recommended that if the patient does not improve 24 to 48 hours with therapy for meningitis with meaningful improvement in her mentation to sustain nutrition focusing on comfort would be appropriate.  Family currently agrees with the recommendation.   Hyponatremia Mild.  Less likely related to presentation.   Hypertensive emergency. Does not take any medication at home. Blood pressure was elevated at the time of admission. Was receiving as needed IV Lopressor. MRI brain negative for any pres. Blood pressure significantly worsened overnight, will be treating aggressively with nitroglycerin ointment and clonidine patch with as needed IV hydralazine.   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 Was able to wear it last night.

## 2023-01-02 NOTE — Plan of Care (Signed)

## 2023-01-02 NOTE — Progress Notes (Signed)
Patient to transfer to [facility]. Report called and family is aware of bed

## 2023-01-02 NOTE — Progress Notes (Signed)
Consult received for PIV placement. Midline placed. Notified nurse that vancomycin can run through midline only 5 days. And PICC/CVC line must be considered d/t poor vasculature. Instructed to notify MD. Robynn Pane RN VU. Tomasita Morrow, RN VAST

## 2023-01-02 NOTE — Progress Notes (Signed)
Patient does not have PIV access - IV consult placed. IV ABX times adjusted.

## 2023-01-02 NOTE — Progress Notes (Signed)
LTM EEG hooked up and running - no initial skin breakdown - push button tested - [facility] monitoring.  

## 2023-01-02 NOTE — Progress Notes (Addendum)
PHARMACY - PHYSICIAN COMMUNICATION CRITICAL VALUE ALERT - BLOOD CULTURE IDENTIFICATION (BCID)  [name] is an 89 y.o. female who presented to [facility] on 12/31/2022 with a chief complaint of weakness and AMS. She was started on ceftriaxone and flagyl on admission for suspected sepsis. Flagyl d/ced on 01/02/23. One of four blood cx bottles collected on 12/31/22 resulted back with GPR. Micro lab does not perform BCID for GPR. MD now suspects pt has meningitis.   Name of physician (or Provider) Contacted: Dr. Allena Katz   Current antibiotics: ceftriaxone  Changes to prescribed antibiotics recommended:  - ampicillin added by Dr. Allena Katz and ceftriaxone dose changed to 2gm q12h - start vancomycin 1000mg  IV x1, then 750 mg IV q24h for trough 15-20 - start acyclovir 480 mg IV q12h for crcl 25-50; cont D5LR @ 75 ml/hr     Lucia Gaskins 01/02/2023  2:59 PM

## 2023-01-02 NOTE — Progress Notes (Signed)
Dear Doctor: This patient has been identified as a candidate for PICC/CVC for the following reason (s): IV therapy over 48 hours, drug extravasation potential with tissue necrosis (KCL, Dilantin, Dopamine, CaCl, MgSO4, chemo vesicant), poor veins/poor circulatory system (CHF, COPD, emphysema, diabetes, steroid use, IV drug abuse, etc.), restarts due to phlebitis and infiltration in 24 hours, and incompatible drugs (aminophyllin, TPN, heparin, given with an antibiotic) If you agree, please write an order for the indicated device.   Thank you for supporting the early vascular access assessment program. 

## 2023-01-02 NOTE — Progress Notes (Signed)
[facility] Progress Note Patient: [name] JYN:[id number] DOB: 1929-10-23 DOA: 12/31/2022  DOS: the patient was seen and examined on 01/02/2023  Brief hospital course: PMH of HTN, seizures, OSA on BiPAP, chronic diastolic CHF presented to hospital with complaints of generalized weakness. Brought in on 9/8 with weakness and confusion.  At baseline patient is alert awake and oriented x 4, independent for mobility. Had some gradual generalized weakness for last 2 days prior to admission and presented with disorientation.  Repeating the same sentence again and again unable to identify family members.  No fall no trauma no injury reported. Admitted for a possible UTI. Due to encephalopathy as well as focal deficit MRI brain was performed which was negative for any acute stroke. EEG showed concern for postictal state.  Discussed with neurology.  Patient will be transferred to [facility] for LTM.  LP recommended to rule out meningitis. Antibiotic coverage changed to meningitis coverage. Palliative care consulted. Echo showed EF 45%.  Telemetry confirmed flutter waves.  Discussed with cardiology conservative management recommended for now.  Assessment and Plan: Severe sepsis.  Source of infection identified. Meeting SIRS criteria with fever, tachycardia and encephalopathy. Initially thought to be a UTI but chest x-ray and UA unremarkable. Urine culture grew multiple species. Blood cultures 1 out of 4 positive for gram-positive rods (concern for Listeria). COVID-negative flu negative RSV negative.  RVP negative as well. She has left lower extremity wound which family reports is almost healed and was last seen several days ago by wound care. Has chronic diarrhea.  C. difficile negative BMs are now solid. Treated with IV Rocephin and Flagyl. Given concern for encephalopathy and seizures antibiotic coverage broadened to include meningitis therapy with IV vancomycin, IV ampicillin, IV ceftriaxone as  well as IV acyclovir. Repeat blood cultures tomorrow. Initiating IV hydration.  Acute encephalopathy Concern for meningitis/encephalitis History of seizures with concern for active seizures. Per family the patient is independent at her baseline, very sharp without any significant confusion. Presents with slurred speech and sleepiness. Unable to identify family members, not oriented, unable to swallow safely, unable to identify objects. Repeating same words again again. Unable to follow complex commands. No focal deficit in motor strength, withdraws to pain. MRI brain negative for any acute stroke. EEG concerning for left hemispheric cortical dysfunction and encephalopathy. Patient is on Keppra 500 mg daily at home. Dose currently increased to 400 mg twice daily. Discussed with neurology. Requested LTM EEG monitoring. Neurology also recommended LP to rule out meningitis. Patient to be transferred to [facility].  Further workup and treatment deferred to neurology.  Dysphagia. Has prior history of dysphagia.  But was eating normal food prior to coming to the hospital. Currently unable to swallow safely. Speech evaluated on 9/9 and was recommended to have D2 nectar thick liquid diet. Will request speech reevaluation. Currently holding or hold oral medication. Family does not want to proceed with feeding tube.   New onset atrial flutter with RVR New onset acute systolic dysfunction with prior history of chronic diastolic CHF, likely cardiomyopathy from tachycardia No prior history. Discussed with cardiology. Initially concern for atrial fibrillation although EKG does not show any evidence of A-fib and clear P waves were seen. After monitoring on the telemetry and the patient appears to have atrial flutter. Her rate is currently controlled. Echocardiogram shows EF of 40 to 45%. Global wall motion abnormality. Discussed with the patient's cardiologist Dr. Rosemary Holms,  recommend conservative measures as long as rate is controlled. Suspect A-  flutter as well as cardiomyopathy both are incidental to infection and acute stress. And recommended to treat the underlying condition. Recommend anticoagulation if there is no contraindication with Eliquis when the patient is able to swallow safely. Continue as needed Lopressor. Volume level currently adequate.  Actively receiving IV hydration.  Monitor for volume overload.   Lactic acidosis Mild. Resolved.  Goals of care conversation. Per family patient is highly functional 89 year old with stable medical condition who presented with sudden change in her mental state secondary to fever/seizures/hypertensive emergency. At present the patient is encephalopathic, unable to swallow safely found to have a flutter with RVR and systolic dysfunction/cardiomyopathy. Family's goal is clear.  They do not want to put the patient through unnecessary tests or procedures but would like to treat reversible conditions. Patient has a DNR. I have given them a copy of MOST form. They do not want feeding tube. I have recommended that if the patient does not improve 24 to 48 hours with therapy for meningitis with meaningful improvement in her mentation to sustain nutrition focusing on comfort would be appropriate.  Family currently agrees with the recommendation.   Hyponatremia Mild.  Less likely related to presentation.   Hypertensive emergency. Does not take any medication at home. Blood pressure was elevated at the time of admission. Was receiving as needed IV Lopressor. MRI brain negative for any pres. Blood pressure significantly worsened overnight, will be treating aggressively with nitroglycerin ointment and clonidine patch with as needed IV hydralazine.   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 Was able to wear it last night.    Subjective: Mentation worsened further.  Unable to identify family members.  Unable to  answer any questions.  Having difficulty following simple commands which she was able to follow.  Having difficulty swallowing safely which she was able to eat yesterday.  Physical Exam: In moderate distress.  No rash. S1-S2 present.  Rate controlled.  No murmur. Clear to auscultation. Bowel sound present. Pupils are equal and reactive to light.  Extraocular muscle movement present although left gaze preference present. Had a anomia yesterday. Unable to tell me her name today.  Unable to follow any complex commands. Bilateral equal strength today.  Data Reviewed: I have Reviewed nursing notes, Vitals, and Lab results. Since last encounter, pertinent lab results CBC and BMP   . I have ordered test including CBC and BMP  . I have discussed pt's care plan and test results with neurology  . I have ordered imaging LP  .   Disposition: Status is: Inpatient Remains inpatient appropriate because: Need further workup and if fails to progress, will require goals of care conversation clarity.  enoxaparin (LOVENOX) injection 40 mg Start: 01/01/23 1000   Family Communication: Discussed with family at bedside. Level of care: Telemetry Medical transporting to [facility] on telemetry. Vitals:   01/02/23 0858 01/02/23 1148 01/02/23 1349 01/02/23 1610  BP: (!) 164/94 (!) 195/96 (!) 153/75 (!) 157/81  Pulse:  88 88 (!) 104  Resp: 18  16 17   Temp: 99.6 F (37.6 C)  99.6 F (37.6 C) 98.4 F (36.9 C)  TempSrc: Rectal  Rectal Axillary  SpO2: 100% 99% 98% 98%  Weight:      Height:         Author: Lynden Oxford, MD 01/02/2023 4:28 PM  Please look on [email or web address] to find out who is on call.

## 2023-01-02 NOTE — Progress Notes (Signed)
PHARMACY NOTE:  ANTIMICROBIAL RENAL DOSAGE ADJUSTMENT  Current antimicrobial regimen includes a mismatch between antimicrobial dosage and estimated renal function.  As per policy approved by the Pharmacy & Therapeutics and Medical Executive Committees, the antimicrobial dosage will be adjusted accordingly.  Current antimicrobial dosage: ampicillin 2 g IV q4h  Indication: meningitis  Renal Function:  Estimated Creatinine Clearance: 30.2 mL/min (by C-G formula based on SCr of 0.63 mg/dL).     Antimicrobial dosage has been changed to:  ampicillin 2 g IV q6h    Thank you for allowing pharmacy to be a part of this patient's care.   Pricilla Riffle, PharmD, BCPS Clinical Pharmacist 01/02/2023 3:23 PM

## 2023-01-03 ENCOUNTER — Ambulatory Visit (HOSPITAL_BASED_OUTPATIENT_CLINIC_OR_DEPARTMENT_OTHER): Payer: Medicare Other | Admitting: Physician Assistant

## 2023-01-03 DIAGNOSIS — R627 Adult failure to thrive: Secondary | ICD-10-CM

## 2023-01-03 DIAGNOSIS — E876 Hypokalemia: Secondary | ICD-10-CM

## 2023-01-03 DIAGNOSIS — R569 Unspecified convulsions: Secondary | ICD-10-CM | POA: Diagnosis not present

## 2023-01-03 DIAGNOSIS — I4891 Unspecified atrial fibrillation: Secondary | ICD-10-CM | POA: Diagnosis not present

## 2023-01-03 DIAGNOSIS — R6510 Systemic inflammatory response syndrome (SIRS) of non-infectious origin without acute organ dysfunction: Secondary | ICD-10-CM | POA: Diagnosis not present

## 2023-01-03 DIAGNOSIS — G40909 Epilepsy, unspecified, not intractable, without status epilepticus: Secondary | ICD-10-CM | POA: Diagnosis not present

## 2023-01-03 DIAGNOSIS — G039 Meningitis, unspecified: Secondary | ICD-10-CM | POA: Diagnosis not present

## 2023-01-03 DIAGNOSIS — I5020 Unspecified systolic (congestive) heart failure: Secondary | ICD-10-CM | POA: Diagnosis not present

## 2023-01-03 DIAGNOSIS — R509 Fever, unspecified: Secondary | ICD-10-CM | POA: Diagnosis not present

## 2023-01-03 DIAGNOSIS — Z515 Encounter for palliative care: Secondary | ICD-10-CM

## 2023-01-03 DIAGNOSIS — Z7189 Other specified counseling: Secondary | ICD-10-CM

## 2023-01-03 LAB — COMPREHENSIVE METABOLIC PANEL
ALT: 14 U/L (ref 0–44)
AST: 20 U/L (ref 15–41)
Albumin: 3 g/dL — ABNORMAL LOW (ref 3.5–5.0)
Alkaline Phosphatase: 56 U/L (ref 38–126)
Anion gap: 11 (ref 5–15)
BUN: 22 mg/dL (ref 8–23)
CO2: 23 mmol/L (ref 22–32)
Calcium: 8.4 mg/dL — ABNORMAL LOW (ref 8.9–10.3)
Chloride: 101 mmol/L (ref 98–111)
Creatinine, Ser: 0.73 mg/dL (ref 0.44–1.00)
GFR, Estimated: >60 mL/min (ref >60)
Glucose, Bld: 124 mg/dL — ABNORMAL HIGH (ref 70–99)
Potassium: 2.3 mmol/L — CL (ref 3.5–5.1)
Sodium: 135 mmol/L (ref 135–145)
Total Bilirubin: 0.7 mg/dL (ref 0.3–1.2)
Total Protein: 5.7 g/dL — ABNORMAL LOW (ref 6.5–8.1)

## 2023-01-03 LAB — CBC WITH DIFFERENTIAL/PLATELET
Abs Immature Granulocytes: 0.03 10*3/uL (ref 0.00–0.07)
Basophils Absolute: 0 10*3/uL (ref 0.0–0.1)
Basophils Relative: 0 %
Eosinophils Absolute: 0.1 10*3/uL (ref 0.0–0.5)
Eosinophils Relative: 2 %
HCT: 34.1 % — ABNORMAL LOW (ref 36.0–46.0)
Hemoglobin: 11.6 g/dL — ABNORMAL LOW (ref 12.0–15.0)
Immature Granulocytes: 0 %
Lymphocytes Relative: 11 %
Lymphs Abs: 0.8 10*3/uL (ref 0.7–4.0)
MCH: 30.4 pg (ref 26.0–34.0)
MCHC: 34 g/dL (ref 30.0–36.0)
MCV: 89.5 fL (ref 80.0–100.0)
Monocytes Absolute: 0.7 10*3/uL (ref 0.1–1.0)
Monocytes Relative: 9 %
Neutro Abs: 5.8 10*3/uL (ref 1.7–7.7)
Neutrophils Relative %: 78 %
Platelets: 130 10*3/uL — ABNORMAL LOW (ref 150–400)
RBC: 3.81 MIL/uL — ABNORMAL LOW (ref 3.87–5.11)
RDW: 15.4 % (ref 11.5–15.5)
WBC: 7.5 10*3/uL (ref 4.0–10.5)
nRBC: 0 % (ref 0.0–0.2)

## 2023-01-03 LAB — GLUCOSE, CAPILLARY
Glucose-Capillary: 113 mg/dL — ABNORMAL HIGH (ref 70–99)
Glucose-Capillary: 143 mg/dL — ABNORMAL HIGH (ref 70–99)
Glucose-Capillary: 173 mg/dL — ABNORMAL HIGH (ref 70–99)
Glucose-Capillary: 98 mg/dL (ref 70–99)

## 2023-01-03 LAB — GASTROINTESTINAL PANEL BY PCR, STOOL (REPLACES STOOL CULTURE)

## 2023-01-03 LAB — HIV ANTIBODY (ROUTINE TESTING W REFLEX): HIV Screen 4th Generation wRfx: NONREACTIVE

## 2023-01-03 LAB — RPR: RPR Ser Ql: NONREACTIVE

## 2023-01-03 LAB — VITAMIN B12: Vitamin B-12: 343 pg/mL (ref 180–914)

## 2023-01-03 LAB — PROTIME-INR
INR: 1.3 — ABNORMAL HIGH (ref 0.8–1.2)
Prothrombin Time: 16.1 s — ABNORMAL HIGH (ref 11.4–15.2)

## 2023-01-03 LAB — AMMONIA: Ammonia: 31 umol/L (ref 9–35)

## 2023-01-03 LAB — APTT: aPTT: 30 s (ref 24–36)

## 2023-01-03 MED ORDER — ASPIRIN 300 MG RE SUPP
300.0000 mg | Freq: Every day | RECTAL | Status: DC
  Administered 2023-01-03 – 2023-01-05 (×3): 300 mg via RECTAL
  Filled 2023-01-03 (×3): qty 1

## 2023-01-03 MED ORDER — POTASSIUM CHLORIDE 2 MEQ/ML IV SOLN
INTRAVENOUS | Status: AC
  Filled 2023-01-03 (×2): qty 1000

## 2023-01-03 MED ORDER — THIAMINE HCL 100 MG/ML IJ SOLN
500.0000 mg | Freq: Every day | INTRAVENOUS | Status: DC
  Administered 2023-01-03 – 2023-01-05 (×3): 500 mg via INTRAVENOUS
  Filled 2023-01-03 (×3): qty 5

## 2023-01-03 MED ORDER — VITAMIN B-12 1000 MCG PO TABS: 1000.0000 ug | ORAL_TABLET | Freq: Every day | ORAL | Status: DC

## 2023-01-03 MED ORDER — ENOXAPARIN SODIUM 30 MG/0.3ML IJ SOSY
30.0000 mg | PREFILLED_SYRINGE | INTRAMUSCULAR | Status: DC
  Administered 2023-01-04: 30 mg via SUBCUTANEOUS
  Filled 2023-01-03: qty 0.3

## 2023-01-03 MED ORDER — FENTANYL CITRATE PF 50 MCG/ML IJ SOSY: 6.2500 ug | PREFILLED_SYRINGE | Freq: Once | INTRAMUSCULAR | Status: DC | PRN

## 2023-01-03 NOTE — Progress Notes (Signed)
Neurology Progress Note  Brief HPI: [name] is a 89 y.o. female with a medical history significant for chronic diastolic heart failure, HTN, OSA on CPAP, seizure disorder on home Keppra (previously on Dilantin but this was discontinued due to Dilantin toxicity with subsequent dizziness and recurrent falls in 2020) who presented to the ED on 9/8 for evaluation of fatigue, weakness, and confusion.  Patient initially began to complain of increased fatigue on 9/7 and on 9/8 family noted that patient was acutely confused and weak as she is unable to get herself up to the restroom. On arrival to the ED, the patient was noted to only be answering "yes" or "no" questions without further verbalization.  Initial workup at [facility] revealed febrile patient to 101.5 F, tachycardia, and hypertensive blood pressures with SBP up to 204 mmHg.  CBC was without leukocytosis.  UA negative for acute UTI.  Respiratory panel unremarkable.  EEG revealed cortical dysfunction of the left hemisphere consistent with structural abnormality versus postictal state with additional mild diffuse encephalopathy.  Due to neurologic deficits on exam 9/9 including aphasia, anomia, and left gaze preference in the setting of a negative MRI, patient was transferred to [facility] hospital for long-term EEG monitoring and further neurologic work up and evaluation.    At baseline, patient has no reported cognitive impairment and lives with her son.  She reportedly is very sharp and is independent with her ADLs.  Patient's son notes that she vacuums the house on a daily basis in her usual state of health.  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   It is unclear when/why keppra was changed from 500 BID to 500 daily but she has been tolerating this dose well for  a long time now; this medication is currently managed by her PCP   Prior AEDS: Dilantin -- stopped due to side effects Vimpat -- stopped to due to dizziness    Current Facility-Administered Medications:    acetaminophen (TYLENOL) suppository 650 mg, 650 mg, Rectal, Q4H PRN **OR** acetaminophen (TYLENOL) tablet 650 mg, 650 mg, Oral, Q4H PRN, Rolly Salter, MD   acyclovir (ZOVIRAX) 485 mg in dextrose 5 % 100 mL IVPB, 10 mg/kg, Intravenous, Q12H, Pham, Anh P, RPH, Stopped at 01/03/23 1003   ampicillin (OMNIPEN) 2 g in sodium chloride 0.9 % 100 mL IVPB, 2 g, Intravenous, Q6H, Rolly Salter, MD, Last Rate: 300 mL/hr at 01/03/23 1434, 2 g at 01/03/23 1434   aspirin suppository 300 mg, 300 mg, Rectal, Daily, Samtani, Jai-Gurmukh, MD, 300 mg at 01/03/23 1419   cefTRIAXone (ROCEPHIN) 2 g in sodium chloride 0.9 % 100 mL IVPB, 2 g, Intravenous, Q12H, Rolly Salter, MD, Last Rate: 200 mL/hr at 01/03/23 1012, 2 g at 01/03/23 1012   cloNIDine (CATAPRES - Dosed in mg/24 hr) patch 0.1 mg, 0.1 mg, Transdermal, Weekly, Rolly Salter, MD, 0.1 mg at 01/02/23 1223   [START ON 01/04/2023] enoxaparin (LOVENOX) injection 30 mg, 30 mg, Subcutaneous, Q24H, Toberman, Stevi W, NP   hydrALAZINE (APRESOLINE) injection 10 mg, 10 mg, Intravenous, Q4H PRN, Rolly Salter, MD   lactated ringers 1,000 mL with potassium chloride 40 mEq infusion, , Intravenous, Continuous, Samtani, Jai-Gurmukh, MD, Last Rate: 75 mL/hr at 01/03/23 1234, New Bag at 01/03/23 1234   levETIRAcetam (KEPPRA) IVPB  500 mg/100 mL premix, 500 mg, Intravenous, Q12H, Rolly Salter, MD, Last Rate: 400 mL/hr at 01/03/23 1110, 500 mg at 01/03/23 1110   metoprolol tartrate (LOPRESSOR) injection 2.5 mg, 2.5 mg, Intravenous, Q5 min PRN, Tu, Ching T, DO, 2.5 mg at 01/02/23 0812   nitroGLYCERIN (NITROGLYN) 2 % ointment 1 inch, 1 inch, Topical, Q6H, Rolly Salter, MD, 1 inch at 01/03/23 1626   sodium chloride flush (NS) 0.9 % injection 10-40 mL, 10-40 mL,  Intracatheter, Q12H, Ghimire, Kuber, MD, 10 mL at 01/02/23 02-04-04   sodium chloride flush (NS) 0.9 % injection 10-40 mL, 10-40 mL, Intracatheter, PRN, Dorcas Carrow, MD   vancomycin (VANCOREADY) IVPB 750 mg/150 mL, 750 mg, Intravenous, Q24H, Pham, Minh Q, RPH-CPP   Subjective: Patient seen in room.   Exam: Vitals:   01/03/23 0036 01/03/23 0405  BP: 138/72 (!) 141/70  Pulse:    Resp: (!) 22 (!) 22  Temp: 99.2 F (37.3 C) 98.6 F (37 C)  SpO2: 95% 97%   Constitutional: Thin Caucasian female, acutely ill-appearing MSK: Left lower extremity has a gauze wrap bandage in place without notable drainage Tenderness with palpation of neck, neck is stiff  Cardiovascular: Sinus tachycardia, extremities are warm Respiratory: Effort normal, non-labored breathing on room air GI: Soft.  No distension. There is no tenderness.  Skin: Warm dry and intact visible skin   Neuro: Mental Status: Patient awake, states name but does not identify family members. States "I don't remember" when asked further orientation questions.   She does not attempt to name objects or repeat phrases.  Patient does follow one step commands with encouragement Cranial Nerves: II: PERRL III,IV, VI: EOMI, appears at times to have a left gaze with leftward head turn but with passive head turn rightward, her eyes look fully rightward as well V: symmetric sensation to eyelash brush VII: Facial movement is symmetric.  VIII: Hearing is intact to voice X: Patient is hypophonic XI: Head is turned leftward on exam XII: Does not protrude tongue to command Motor/Sensory: Tone is normal. Bulk is decreased though appropriate for age. Bilateral upper extremities will elevate antigravity for 10 seconds.  Bilateral lower extremities elevate antigravity for 5 seconds with assistance in initially lifting them Cerebellar: Does not perform  Gait:  Deferred for patient safety in the setting of acute alteration in mental status   Basic  Metabolic Panel: Recent Labs  Lab 12/31/22 04-Feb-2031 01/01/23 0509 01/02/23 0821 01/03/23 0755  NA 134* 132* 132* 135  K 3.5 3.1* 4.2 2.3*  CL 97* 96* 98 101  CO2 25 22 19* 23  GLUCOSE 118* 93 110* 124*  BUN 14 11 17 22   CREATININE 0.60 0.59 0.63 0.73  CALCIUM 9.5 8.5* 8.5* 8.4*  MG  --   --  1.9  --     CBC: Recent Labs  Lab 12/31/22 2031/02/04 01/01/23 0509 01/02/23 0821 01/03/23 0755  WBC 9.2 8.7 9.4 7.5  NEUTROABS 7.6  --   --  5.8  HGB 12.9 13.1 14.0 11.6*  HCT 40.3 39.4 42.7 34.1*  MCV 91.4 90.6 89.7 89.5  PLT 165 145* 125* 130*    Coagulation Studies: Recent Labs    12/31/22 02/04/2031 01/03/23 0755  LABPROT 14.4 16.1*  INR 1.1 1.3*    B12 343 HIV neg RPR neg Ammonia 31 (neg)   Imaging Reviewed: MRI brain 01/01/23:  No acute or reversible finding. Moderate chronic small-vessel ischemic changes of the pons and cerebral hemispheric white matter.  Routine EEG 01/01/23:  This study is cortical dysfunction arising from left hemisphere likely secondary to underlying structural abnormality, post-ictal state. Additionally there is mild diffuse encephalopathy, nonspecific etiology. No seizures or definite epileptiform discharges were seen throughout the recording.  LTM EEG 01/02/2023 1815 to 01/03/2023 0600: ABNORMALITY - Spikes, generalized and maximal right frontal  - Continuous slow, generalized and lateralized left hemisphere - Background asymmetry, left<right IMPRESSION: This study showed evidence of epileptogenicity with generalized and maximal right frontal onset. Additionally there was cortical dysfunction arising from left hemisphere likely secondary to underlying structural abnormality, post-ictal state. Additionally there is mild diffuse encephalopathy, nonspecific etiology. No seizures or definite epileptiform discharges were seen throughout the recording.  Assessment: 89 y.o. female with PMHx of chronic diastolic heart failure, HTN, OSA on CPAP, and seizure  disorder on home Keppra who presented to the ED on 9/8 for evaluation of acute alteration in mental status with fatigue and confusion and was found to be septic.  Patient had ongoing neurologic deficits this morning including aphasia, anomia, and concern for a new left gaze preference.  Due to ongoing neurologic deficits in the setting of sepsis and a negative MRI, patient was transferred to [facility] for further neurology workup and LTM EEG monitoring and broadened to meningitis coverage.   Fortunately exam improving today though still far from baseline. Fever curve also improving and less hypertensive than prior  Recommendations: - LTM EEG monitoring, continue for now, will likely DC tomorrow if continues to improve - Continue Seizure precautions - Continue Keppra 500 mg daily, IV or PO as patient tolerates - Patient will need an LP with CSF studies, failed bedside today, planned for fluoro LP. Interpretation will be challenging after multiple days of antibiotics  - Family requested pain meds for LP, one time dose of 6.25 mg fentanyl ordered given advanced age and low weight - Lovenox retimed to 2200 next dose 9/12 to facilitate LP - CSF: culture with gram stain, cell count with differential in tubes 1 and 4, protein, glucose, meningitis/encephalitis panel, - B1 pending, start empiric thiamine (500 mg IV daily for now for 3 doses, 100 mg daily IV or PO if results low, dc if level results normal) - B12 low normal, 1000 mg daily supplementation for goal > 1000 - Agree with meningitis coverage antibiotics; patient appears to be improving on this - May need ID consult to help determine further antibiotic treatment tomorrow, pending preliminary CSF results  - Neurology will continue to follow    Patient seen and examined by NP/APP with MD. MD to update note as needed.   Elmer Picker, DNP, FNP-BC [facility] Pager: [phone]  Attending Neurologist's note:  I personally saw  this patient, gathering history, performing a full neurologic examination, reviewing relevant labs, personally reviewing relevant imaging including MRI brain, and formulated the assessment and plan, adding the note above for completeness and clarity to accurately reflect my thoughts   Brooke Dare MD-PhD [facility] [phone] Available 7 AM to 7 PM, outside these hours please contact Neurologist on call listed on AMION

## 2023-01-03 NOTE — Progress Notes (Signed)
Speech Language Pathology Treatment: Dysphagia  Patient Details Name: [name] MRN: [id number] DOB: 1929/09/19 Today's Date: 01/03/2023 Time: 8295-6213 SLP Time Calculation (min) (ACUTE ONLY): 18 min  Assessment / Plan / Recommendation Clinical Impression  Pt seen for clinical swallowing re-evaluation. Pt now NPO given increased difficulty swallowing yesterday. Pt alert, slow to respond, and distractible at times. Oral care provided via items from oral care kit prior to POs. Pt inconsistently able to follow commands during today's tx. Pt presents with s/sx highly concerning for worsening pharyngeal dysphagia given seemingly delayed swallow initiation, seemingly reduced laryngeal elevation, prolonged immediate and delayed coughing with POs as well as audible swallows. At present a safe oral diet cannot be recommended. Recommend NPO with plan for clinical re-assessment next date. Pt and family educated regarding results, recommendations, benefits for oral hygiene, and SLP POC. ?understanding by pt. Family verbalized understanding/agreement. Pt left in care of nursing.   HPI HPI: Pt is a 89 y.o. female who presented on 12/31/2022 with weakness and confusion. MRI ordered to r/o stroke. Significant PMH includes chronic diastolic heart failure, HTN, phayrngeal dysphagia, OSA, and seizure disorder. EEG 01/03/23, "This study showed evidence of epileptogenicity with generalized and maximal right frontal onset. Additionally there was cortical dysfunction arising from left hemisphere likely secondary to underlying structural abnormality, post-ictal state. Additionally there is mild diffuse encephalopathy, nonspecific etiology. No seizures or definite epileptiform discharges were seen throughout the recording."      SLP Plan  Continue with current plan of care      Recommendations for follow up therapy are one component of a multi-disciplinary discharge planning process, led by the attending physician.   Recommendations may be updated based on patient status, additional functional criteria and insurance authorization.    Recommendations  Medication Administration: Via alternative means                  Oral care QID;Staff/trained caregiver to provide oral care   Frequent or constant Supervision/Assistance Dysphagia, unspecified (R13.10)     Continue with current plan of care    [name], M.S., CCC-SLP Speech-Language Pathologist Secure Chat Preferred  O: [phone]  Woodroe Chen  01/03/2023, 9:13 AM

## 2023-01-03 NOTE — Progress Notes (Signed)
HOSPITALIST ROUNDING NOTE [name] RJJ:[id number]  DOB: May 11, 1929  DOA: 12/31/2022  PCP: Gwendolyn Harries, MD  01/03/2023,8:02 AM   LOS: 2 days      Code Status: DNR   From: Home  current Dispo: Likely will require skilled care at discharge     89 year old female HFpEF HTN (EF 60-65% previously OSA/CPAP IBS with chronic diarrhea CAD status post NSTEMI 2019 previous ASA/Plavix secondary to BMS DES Prior R hip fracture 02/2021 Seizure disorder on Keppra (intolerant Dilantin 2/2 dizziness falls, intolerant Vimpat 2/2 dizzy ) Came to ED 12/31/2018 for dysuria fever became very fatigued weak-note chronic diarrhea Small wound LLE followed by wound care which was not examined at family request on admission Febrile 101.5 RVR 140s Rx multiple doses Lopressor, Rocephin for presumed UTI-MRI brain negative for any acute stroke RVP negative Eventually found to have left gaze preference anomia aphasia EEG 01/01/2023 cortical dysfunction left hemispheric 2/2 underlying structural abnormality 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 other day ABD pad 9/10 transfer [facility], neurology consulted--- LP recommended CSF Gram stain cell count RPR B1 B12 HIV and antibacterials suggested to meningitic coverage, long-term EEG monitoring performed   Plan Metabolic encephalopathy likely secondary to meningitis-no other explanation for improvement in behavior--aphasia anomia and right gaze preference unfounded on MRI for stroke On meningitic coverage ampicillin 2 g ceftriaxone 2 g every 12 vancomycin and acyclovir-temperature curve improved LP attempted 9/11-appreciate neurology attempt-IR to perform under fluoroscopy a.m. Family at bedside states improvement in echolalia/repetitive behaviors but mentation still far from baseline of ambulation with walker and independent at baseline Confounded by EEG showing  epileptogenicity with cortical dysfunction from left-defer to epileptologist  CAD NSTEMI DES 2019 Cannot take oral meds given safety concerns-ASA 300 PR Continue Lopressor 2.55-minute as needed, nitroglycerin ointment every 6 hourly, transdermal clonidine 0.1 Not on PTA GDMT?  Only ASA and Zetia-Zetia held  Metabolic encephalopathy n.p.o. for now SLP to reeval   severe hypokalemia As n.p.o. K40 @ 75 cc/H, magnesium still pending  IBS D Hold Lomotil at this time  Seizure disorder on Keppra Previously seen by neurology-intolerant Dilantin, Vimpat Continue Keppra500 every 12  Prior hip fracture 02/2021 PT eval once more coherent   DVT prophylaxis: Lovenox  Status is: Inpatient Remains inpatient appropriate because:   Requires improvement-May require skilled care    Subjective: Was answering yes/no questions early this morning according to family at bedside At baseline uses a walker although does not drive does everything else and is "sharp as attack" She only responds monosyllabically clearly although she understands what I am saying and follows commands Overall I will give her GCS about a 12/15  Objective + exam Vitals:   01/02/23 1610 01/02/23 2053 01/03/23 0036 01/03/23 0405  BP: (!) 157/81 (!) 149/71 138/72 (!) 141/70  Pulse: (!) 104     Resp: 17 (!) 22 (!) 22 (!) 22  Temp: 98.4 F (36.9 C) 99.9 F (37.7 C) 99.2 F (37.3 C) 98.6 F (37 C)  TempSrc: Axillary Axillary Axillary Axillary  SpO2: 98% 96% 95% 97%  Weight:      Height:       Filed Weights   12/31/22 2112 01/01/23 0243  Weight: 57 kg 48.3 kg    Examination:  Awake frail white female sleepy but rouses readily-moderately built poorly nourished S1-S2 no murmur Abdomen soft DTRs deferred Is able to raise her arms and  legs off the bed Brudzinski equivocal did not test Kernig's Rest of exam circumscribed  Data Reviewed: reviewed   CBC    Component Value Date/Time   WBC 9.4 01/02/2023 0821    RBC 4.76 01/02/2023 0821   HGB 14.0 01/02/2023 0821   HCT 42.7 01/02/2023 0821   PLT 125 (L) 01/02/2023 0821   MCV 89.7 01/02/2023 0821   MCH 29.4 01/02/2023 0821   MCHC 32.8 01/02/2023 0821   RDW 15.4 01/02/2023 0821   LYMPHSABS 1.0 12/31/2022 2032   MONOABS 0.6 12/31/2022 2032   EOSABS 0.0 12/31/2022 2032   BASOSABS 0.0 12/31/2022 2032      Latest Ref Rng & Units 01/03/2023    7:55 AM 01/02/2023    8:21 AM 01/01/2023    5:09 AM  CMP  Glucose 70 - 99 mg/dL 324  401  93   BUN 8 - 23 mg/dL 22  17  11    Creatinine 0.44 - 1.00 mg/dL 0.27  2.53  6.64   Sodium 135 - 145 mmol/L 135  132  132   Potassium 3.5 - 5.1 mmol/L 2.3  4.2  3.1   Chloride 98 - 111 mmol/L 101  98  96   CO2 22 - 32 mmol/L 23  19  22    Calcium 8.9 - 10.3 mg/dL 8.4  8.5  8.5   Total Protein 6.5 - 8.1 g/dL 5.7     Total Bilirubin 0.3 - 1.2 mg/dL 0.7     Alkaline Phos 38 - 126 U/L 56     AST 15 - 41 U/L 20     ALT 0 - 44 U/L 14        Scheduled Meds:  aspirin  300 mg Rectal Daily   cloNIDine  0.1 mg Transdermal Weekly   [START ON 01/04/2023] enoxaparin (LOVENOX) injection  30 mg Subcutaneous Q24H   nitroGLYCERIN  1 inch Topical Q6H   sodium chloride flush  10-40 mL Intracatheter Q12H   Continuous Infusions:  acyclovir Stopped (01/03/23 0012)   ampicillin (OMNIPEN) IV 2 g (01/03/23 0140)   cefTRIAXone (ROCEPHIN)  IV 2 g (01/03/23 0013)   dextrose 5% lactated ringers 75 mL/hr at 01/02/23 2259   levETIRAcetam 500 mg (01/03/23 0053)   vancomycin      Time 50  [name], MD  [facility]

## 2023-01-03 NOTE — Progress Notes (Signed)
LTM maint complete - no skin breakdown seen. [facility] monitored, Event button test confirmed by [facility].

## 2023-01-03 NOTE — Progress Notes (Signed)
   01/03/23 0036  BiPAP/CPAP/SIPAP  Reason BIPAP/CPAP not in use Other(comment) (pt on EEG)   Pt on EEG and cannot currently use bipap

## 2023-01-03 NOTE — Consult Note (Signed)
Consultation Note Date: 01/03/2023   Patient Name: [name]  DOB: 1929-05-28  MRN: [id number]  Age / Sex: 89 y.o., female  PCP: Tracey Harries, MD Referring Physician: [name], MD  Reason for Consultation: Establishing goals of care  HPI/Patient Profile: 89 y.o. 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   Patient initially presented to [facility], [facility], [facility] and was found to meet SIRS criteria with fever, A-fib with RVR, acute metabolic encephalopathy, lactic acidosis. EEG revealed cortical dysfunction of the left hemisphere consistent with structural abnormality versus postictal state with additional mild diffuse encephalopathy.  Due to neurologic deficits on exam 9/9 including aphasia, anomia, and left gaze preference in the setting of a negative MRI, patient was transferred to [facility] hospital for long-term EEG monitoring and further neurologic work up and evaluation.   PMT has been consulted to assist with goals of care conversation.  Clinical Assessment and Goals of Care:  I have reviewed medical records including EPIC notes, labs and imaging, discussed with RN, assessed the patient and then met in the waiting room with patient's son and ex-daughter-in-law to discuss diagnosis prognosis, GOC, EOL wishes, disposition and options.  I introduced Palliative Medicine as specialized medical care for people living with serious illness. It focuses on providing relief from the symptoms and stress of a serious illness. The goal is to improve quality of life for both the patient and the family.  We discussed a brief life review of the patient and then focused on their current illness.  The natural disease trajectory and expectations at EOL were discussed.  I attempted to  elicit values and goals of care important to the patient.    Medical History Review and Understanding:  Reviewed patient's acute illness and current care plan, ongoing workup.  Family have a good understanding severity of patient's illness.  Social History: Patient has 1 son who lives with her.  She is very close to son's ex-wife/best friend Marylene Land.  She is described as a rigid rule follower.  She enjoyed reading, cleaning and a regular daily routine involving vacuuming in particular.  She may have some obsessive compulsive tendencies according to family.  Functional and Nutritional State: Patient was ambulating with a walker at baseline.  She was starting to eat better per family.  Albumin of 3.0 noted on 9/11.  Advance Directives: A detailed discussion regarding advanced directives was had.  Patient's son believes he may have these documents at home and will bring them as soon as he can.  Code Status: Concepts specific to code status, artifical feeding and hydration, and rehospitalization were considered and discussed.   Discussion: We discussed previous goals of care discussions held up until today and patient's family speak very highly of Dr. Allena Katz and the updates received in a compassionate manner.  Emotional support and therapeutic listening was provided as they reviewed their decision making process and rationale behind proceeding with workup for possible meningitis.  While they were initially hesitant, they started to realize that if patient were able to input her opinion, she would likely say "lets do it." They are very encouraged that patient is more interactive today, even laughing for the first time in decades.  They hope that she will continue to respond to antibiotics and they remain interested in proceeding with LP.  They are praying that they make the right decision on her behalf.   At  the same time, quality of life is very important and they would not want to make decisions that  resulted in patient's inability to return to her previous level of cognitive functioning.  She is a very sharp and determined woman who cares a lot of what people might think, so dignity is an important factor as well.  For example, she would never use the bedside commode under any circumstances. For now, they are taking things day by day.  She has been to SNF in the past and this would certainly be an option if she improves enough to transition out of the hospital.  Marylene Land also has tremendous caregiver experience, guiding son Clide Cliff through this process.  Hospice and Palliative Care services outpatient were explained and offered.   Discussed the importance of continued conversation with family and the medical providers regarding overall plan of care and treatment options, ensuring decisions are within the context of the patient's values and GOCs.   Questions and concerns were addressed.  Hard Choices booklet left for review. The family was encouraged to call with questions or concerns.  PMT will continue to support holistically.   SUMMARY OF RECOMMENDATIONS   -Continue DNR/DNI -Continue current care and ongoing neurological workup -Patient's family are hopeful for improvement at this time -Patient would not find it acceptable to have persistent cognitive deficits; discuss possible comfort care and hospice if no improvement -Psychosocial and emotional support provided -PMT will continue to follow and support   Prognosis:  Guarded  Discharge Planning: To Be Determined      Primary Diagnoses: Present on Admission:  Essential hypertension  Hyponatremia  Diastolic CHF (HCC)  Fever   Physical Exam Vitals and nursing note reviewed.  Constitutional:      General: She is not in acute distress.    Appearance: She is ill-appearing.  Cardiovascular:     Rate and Rhythm: Normal rate.  Pulmonary:     Effort: Pulmonary effort is normal. No respiratory distress.  Neurological:     Mental  Status: She is alert. She is confused.  Psychiatric:        Cognition and Memory: Cognition is impaired.    Vital Signs: BP 135/72 (BP Location: Right Arm)   Pulse 91   Temp 97.8 F (36.6 C) (Oral)   Resp 20   Ht 4\' 10"  (1.473 m)   Wt 48.3 kg   LMP  (LMP Unknown)   SpO2 98%   BMI 22.25 kg/m  Pain Scale: 0-10   Pain Score: 0-No pain   SpO2: SpO2: 98 % O2 Device:SpO2: 98 % O2 Flow Rate: .    Palliative Assessment/Data: TBD     Total time: I spent 75 minutes in the care of the patient today in the above activities and documenting the encounter.  MDM: High    Doc Mandala Jeni Salles, PA-C  Palliative Medicine Team Team phone # [phone]  Thank you for allowing the Palliative Medicine Team to assist in the care of this patient. Please utilize secure chat with additional questions, if there is no response within 30 minutes please call the above phone number.  Palliative Medicine Team providers are available by phone from 7am to 7pm daily and can be reached through the team cell phone.  Should this patient require assistance outside of these hours, please call the patient's attending physician.

## 2023-01-03 NOTE — Progress Notes (Signed)
PICC order received. Spoke to angela patient daughter to get PICC consent. Per daughter floor RN was able to established 2nd PIV enough for patient IV access at this time. RN made aware and will update IV team if PICC still needed. Will follow up.

## 2023-01-03 NOTE — Procedures (Signed)
Patient Name: [name]  MRN: [id number]  Epilepsy Attending: Charlsie Quest  Referring Physician/Provider: Kara Mead, NP  Duration: 01/02/2023 1815 to 01/03/2023 1815  Patient history: 89 y.o. female with PMHx of chronic diastolic heart failure, HTN, OSA on CPAP, and seizure disorder on home Keppra who presented to the ED on 9/8 for evaluation of acute alteration in mental status with fatigue and confusion and was found to be septic. EEG to evaluate for seizure.  Level of alertness: Awake, asleep  AEDs during EEG study: LEV  Technical aspects: This EEG study was done with scalp electrodes positioned according to the 10-20 International system of electrode placement. Electrical activity was reviewed with band pass filter of 1-70Hz , sensitivity of 7 uV/mm, display speed of 68mm/sec with a 60Hz  notched filter applied as appropriate. EEG data were recorded continuously and digitally stored.  Video monitoring was available and reviewed as appropriate.  Description:  The posterior dominant rhythm consists of 7 Hz activity of moderate voltage (25-35 uV) seen predominantly in posterior head regions, asymmetric ( left<right) and reactive to eye opening and eye closing. Sleep was characterized by vertex waves, sleep spindles (12 to 14 Hz), maximal frontocentral region. EEG showed continuous low amplitude 2-3hz  delta slowing as well as 6-7hz  theta slowing in right hemisphere. Generalized and maximal right frontal spikes were noted. Hyperventilation and photic stimulation were not performed.      ABNORMALITY - Spikes, generalized and maximal right frontal  - Continuous slow, generalized and lateralized left hemisphere - Background asymmetry, left<right   IMPRESSION: This study showed evidence of epileptogenicity with generalized and maximal right frontal onset. Additionally there was cortical dysfunction arising from left hemisphere likely secondary to underlying structural abnormality,  post-ictal state. Additionally there is mild diffuse encephalopathy, nonspecific etiology. No seizures were seen throughout the recording.   [name] [name]

## 2023-01-03 NOTE — Progress Notes (Signed)
RT at bedside this AM to assess pt. RT found pt on room air tolerating well w/SVS and no distress.

## 2023-01-03 NOTE — Procedures (Addendum)
LUMBAR PUNCTURE (SPINAL TAP) PROCEDURE NOTE  Indication: meningitis   Proceduralists: Dr Lindie Spruce  Time of procedure: 1500   Risks of the procedure were dicussed with the patient including post-LP headache, bleeding, infection, weakness/numbness of legs(radiculopathy), death.    Consent obtained from: relative    Procedure Note The patient was prepped and draped, and using sterile technique a 20 gauge quinke spinal needle was inserted in the L4-5 space.   Unfortunately the procedure was NOT successful Opening pressure was not measured No CSF were obtained. Patient tolerated the procedure well and blood loss was minimal.  Contacted IR for fluoro guided LP. Will schedule for tomorrow   [name] [name]

## 2023-01-04 ENCOUNTER — Inpatient Hospital Stay (HOSPITAL_COMMUNITY): Payer: Medicare Other

## 2023-01-04 DIAGNOSIS — E872 Acidosis, unspecified: Secondary | ICD-10-CM | POA: Diagnosis not present

## 2023-01-04 DIAGNOSIS — I4891 Unspecified atrial fibrillation: Secondary | ICD-10-CM | POA: Diagnosis not present

## 2023-01-04 DIAGNOSIS — G9341 Metabolic encephalopathy: Secondary | ICD-10-CM | POA: Diagnosis not present

## 2023-01-04 DIAGNOSIS — Z515 Encounter for palliative care: Secondary | ICD-10-CM

## 2023-01-04 DIAGNOSIS — R569 Unspecified convulsions: Secondary | ICD-10-CM | POA: Diagnosis not present

## 2023-01-04 DIAGNOSIS — R6510 Systemic inflammatory response syndrome (SIRS) of non-infectious origin without acute organ dysfunction: Secondary | ICD-10-CM | POA: Diagnosis not present

## 2023-01-04 DIAGNOSIS — G40909 Epilepsy, unspecified, not intractable, without status epilepticus: Secondary | ICD-10-CM | POA: Diagnosis not present

## 2023-01-04 LAB — CSF CELL COUNT WITH DIFFERENTIAL
RBC Count, CSF: 293 /mm3 — ABNORMAL HIGH
Tube #: 3
WBC, CSF: 5 /mm3 (ref 0–5)

## 2023-01-04 LAB — BASIC METABOLIC PANEL
Anion gap: 14 (ref 5–15)
BUN: 13 mg/dL (ref 8–23)
CO2: 20 mmol/L — ABNORMAL LOW (ref 22–32)
Calcium: 8.2 mg/dL — ABNORMAL LOW (ref 8.9–10.3)
Chloride: 102 mmol/L (ref 98–111)
Creatinine, Ser: 0.64 mg/dL (ref 0.44–1.00)
GFR, Estimated: >60 mL/min (ref >60)
Glucose, Bld: 184 mg/dL — ABNORMAL HIGH (ref 70–99)
Potassium: 2.8 mmol/L — ABNORMAL LOW (ref 3.5–5.1)
Sodium: 136 mmol/L (ref 135–145)

## 2023-01-04 LAB — PROTEIN AND GLUCOSE, CSF
Glucose, CSF: 51 mg/dL (ref 40–70)
Total  Protein, CSF: 81 mg/dL — ABNORMAL HIGH (ref 15–45)

## 2023-01-04 LAB — GLUCOSE, CAPILLARY
Glucose-Capillary: 97 mg/dL (ref 70–99)
Glucose-Capillary: 90 mg/dL (ref 70–99)
Glucose-Capillary: 113 mg/dL — ABNORMAL HIGH (ref 70–99)
Glucose-Capillary: 103 mg/dL — ABNORMAL HIGH (ref 70–99)
Glucose-Capillary: 101 mg/dL — ABNORMAL HIGH (ref 70–99)

## 2023-01-04 MED ORDER — LIDOCAINE 1 % OPTIME INJ - NO CHARGE
5.0000 mL | Freq: Once | INTRAMUSCULAR | Status: AC
  Administered 2023-01-04: 2 mL via INTRADERMAL
  Filled 2023-01-04: qty 6

## 2023-01-04 MED ORDER — KETOROLAC TROMETHAMINE 15 MG/ML IJ SOLN
15.0000 mg | Freq: Once | INTRAMUSCULAR | Status: AC
  Administered 2023-01-04: 15 mg via INTRAVENOUS
  Filled 2023-01-04: qty 1

## 2023-01-04 MED ORDER — SODIUM CHLORIDE 0.9 % IV SOLN: INTRAVENOUS | Status: DC

## 2023-01-04 MED ORDER — CYANOCOBALAMIN 1000 MCG/ML IJ SOLN
1000.0000 ug | Freq: Once | INTRAMUSCULAR | Status: AC
  Administered 2023-01-04: 1000 ug via INTRAMUSCULAR
  Filled 2023-01-04: qty 1

## 2023-01-04 MED ORDER — POTASSIUM CHLORIDE 2 MEQ/ML IV SOLN: INTRAVENOUS | Status: DC

## 2023-01-04 MED ORDER — LIDOCAINE 5 % EX PTCH
1.0000 | MEDICATED_PATCH | CUTANEOUS | Status: DC
  Administered 2023-01-04 – 2023-01-05 (×2): 1 via TRANSDERMAL
  Filled 2023-01-04 (×2): qty 1

## 2023-01-04 MED ORDER — GADOBUTROL 1 MMOL/ML IV SOLN
4.0000 mL | Freq: Once | INTRAVENOUS | Status: AC | PRN
  Administered 2023-01-04: 4 mL via INTRAVENOUS

## 2023-01-04 MED ORDER — POTASSIUM CHLORIDE 2 MEQ/ML IV SOLN
INTRAVENOUS | Status: DC
  Filled 2023-01-04 (×2): qty 1000

## 2023-01-04 NOTE — Progress Notes (Signed)
   01/04/23 2234  Assess: MEWS Score  BP 114/61  MAP (mmHg) 77  Pulse Rate (!) 106  ECG Heart Rate (!) 109  Resp 20  Level of Consciousness Alert  SpO2 96 %  O2 Device Bi-PAP  Assess: MEWS Score  MEWS Temp 0  MEWS Systolic 0  MEWS Pulse 1  MEWS RR 0  MEWS LOC 0  MEWS Score 1  MEWS Score Color Green   Repeat VS after Metoprolol PRN noted as shown above. Green MEWS now noted.

## 2023-01-04 NOTE — Progress Notes (Signed)
HOSPITALIST ROUNDING NOTE [name] AOZ:[id number]  DOB: [birth year]  DOA: 12/31/2022  PCP: Tracey Harries, MD  01/04/2023,4:28 PM   LOS: 3 days      Code Status: DNR   From: Home  current Dispo: Likely will require skilled care at discharge     89 year old female HFpEF HTN (EF 60-65% previously OSA/CPAP IBS with chronic diarrhea CAD status post NSTEMI 2019 previous ASA/Plavix secondary to BMS DES Prior R hip fracture 02/2021 Seizure disorder on Keppra (intolerant Dilantin 2/2 dizziness falls, intolerant Vimpat 2/2 dizzy ) Came to ED 12/31/2018 for dysuria fever became very fatigued weak-note chronic diarrhea Small wound LLE followed by wound care which was not examined at family request on admission Febrile 101.5 RVR 140s Rx multiple doses Lopressor, Rocephin for presumed UTI-MRI brain negative for any acute stroke RVP negative Eventually found to have left gaze preference anomia aphasia EEG 01/01/2023 cortical dysfunction left hemispheric 2/2 underlying structural abnormality 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 other day ABD pad 9/10 transfer [facility], neurology consulted--- LP recommended CSF Gram stain cell count RPR B1 B12 HIV and antibacterials suggested to meningitic coverage, long-term EEG monitoring performed  9/11 LP unsuccessful 9/12 LP performed under fluoroscopy-results pending Foley placed  Plan Metabolic encephalopathy likely secondary to meningitis-no other explanation -MRI   [-] For stroke On meningitic coverage ampicillin 2 g ceftriaxone 2 g every 12 vancomycin and acyclovir-temperature curve improved Follow all labs on CSF-meningitis encephalitis protein glucose culture Gram stain cell count etc. etc. HIV negative RPR negative B12 normal ammonia normal Await b1  CAD NSTEMI DES 2019 Cannot take oral meds given safety concerns-ASA 300 PR Continue Lopressor 2.5 x  5-minute as needed, nitroglycerin ointment every 6 hourly, transdermal clonidine 0.1 Not on PTA GDMT?  Only ASA and Zetia-Zetia held  Metabolic encephalopathy n.p.o. for now SLP to reeval patient is more awake and alert-not safe to eat   severe hypokalemia As n.p.o. K40 @ 75 cc/H, magnesium apparently was never done Potassium is improving slowly continue above  IBS D Hold Lomotil at this time  Seizure disorder on Keppra Previously seen by neurology-intolerant Dilantin, Vimpat Continue Keppra500 every 12 IV although B12 was normal- can give empiric try of subcu x 1 as is n.p.o. Confounded by EEG showing epileptogenicity with cortical dysfunction from left-defer to epileptologist  LUTS Retaining urine and In-N-Out cath several times so we finally put a Foley in 9/12 Start Flomax 0.4-voiding trial in 24 to 48 hours otherwise we will need appreciate urology input  Lower extremity wound LLE Clean left lower leg wound with NS, apply Xeroform to entire area every other day, cover with ABD pad and secure with Kerlix roll gauze beginning just above toes and ending right below knee.  Cover with Ace wrap in same fashion as Kerlix for some light compression.  Wound appears clean  Prior hip fracture 02/2021 PT eval once more coherent   DVT prophylaxis: Lovenox  Status is: Inpatient Remains inpatient appropriate because:   Requires improvement-May require skilled care    Subjective:  Definitely more coherent at bedside having some neck pain She is breathing fine right now not sure what was seen overnight?  Unclear if she was put on her usual CPAP? Still a little sleepy received fentanyl so speech therapy was not able to work with patient after the LP LP results still pending Overall has improved on broad-spectrum  antibiotics She was retaining urine earlier and so we placed a Foley  Objective + exam Vitals:   01/04/23 0850 01/04/23 0907 01/04/23 1045 01/04/23 1534  BP: (!) 147/97   (!) 151/88 129/76  Pulse:      Resp:  (!) 23  20  Temp:      TempSrc:      SpO2:    97%  Weight:      Height:       Filed Weights   12/31/22 2112 01/01/23 0243  Weight: 57 kg 48.3 kg    Examination:  Awake frail white female more coherent S1-S2 no murmur, midline in place Chest is clear posterolaterally with intermittent wheeze Left lower extremity is wrapped in a [place] She is able to move her limbs She is able to follow commands and knows time place person etc. which is much better than prior  Data Reviewed: reviewed   CBC    Component Value Date/Time   WBC 7.5 01/03/2023 0755   RBC 3.81 (L) 01/03/2023 0755   HGB 11.6 (L) 01/03/2023 0755   HCT 34.1 (L) 01/03/2023 0755   PLT 130 (L) 01/03/2023 0755   MCV 89.5 01/03/2023 0755   MCH 30.4 01/03/2023 0755   MCHC 34.0 01/03/2023 0755   RDW 15.4 01/03/2023 0755   LYMPHSABS 0.8 01/03/2023 0755   MONOABS 0.7 01/03/2023 0755   EOSABS 0.1 01/03/2023 0755   BASOSABS 0.0 01/03/2023 0755      Latest Ref Rng & Units 01/04/2023    9:51 AM 01/03/2023    7:55 AM 01/02/2023    8:21 AM  CMP  Glucose 70 - 99 mg/dL 914  782  956   BUN 8 - 23 mg/dL 13  22  17    Creatinine 0.44 - 1.00 mg/dL 2.13  0.86  5.78   Sodium 135 - 145 mmol/L 136  135  132   Potassium 3.5 - 5.1 mmol/L 2.8  2.3  4.2   Chloride 98 - 111 mmol/L 102  101  98   CO2 22 - 32 mmol/L 20  23  19    Calcium 8.9 - 10.3 mg/dL 8.2  8.4  8.5   Total Protein 6.5 - 8.1 g/dL  5.7    Total Bilirubin 0.3 - 1.2 mg/dL  0.7    Alkaline Phos 38 - 126 U/L  56    AST 15 - 41 U/L  20    ALT 0 - 44 U/L  14       Scheduled Meds:  aspirin  300 mg Rectal Daily   cloNIDine  0.1 mg Transdermal Weekly   cyanocobalamin  1,000 mcg Intramuscular Once   enoxaparin (LOVENOX) injection  30 mg Subcutaneous Q24H   lidocaine  1 patch Transdermal Q24H   nitroGLYCERIN  1 inch Topical Q6H   sodium chloride flush  10-40 mL Intracatheter Q12H   Continuous Infusions:  acyclovir 485 mg (01/04/23  0924)   ampicillin (OMNIPEN) IV 2 g (01/04/23 1531)   cefTRIAXone (ROCEPHIN)  IV 2 g (01/04/23 0926)   lactated ringers 1,000 mL with potassium chloride 40 mEq/L Pediatric IV infusion     levETIRAcetam 500 mg (01/04/23 1348)   thiamine (VITAMIN B1) injection 500 mg (01/04/23 1053)   vancomycin Stopped (01/03/23 2205)    Time 30  [name], MD  [facility]

## 2023-01-04 NOTE — TOC Progression Note (Signed)
Transition of Care [facility]) - Progression Note    Patient Details  Name: [name] MRN: [id number] Date of Birth: 07/27/1929  [facility] of [facility]) CM/SW Contact  Kermit Balo, RN Phone Number: 01/04/2023, 1:54 PM  Clinical Narrative:     Continuing to follow for d/c needs.   Expected Discharge Plan: Home/Self Care    Expected Discharge Plan and Services                                               Social Determinants of Health (SDOH) Interventions SDOH Screenings   Food Insecurity: No Food Insecurity (01/01/2023)  Housing: Low Risk  (01/01/2023)  Transportation Needs: No Transportation Needs (01/01/2023)  Utilities: Not At Risk (01/01/2023)  Financial Resource Strain: Low Risk  (08/08/2022)   Received from [facility]  Physical Activity: Insufficiently Active (01/25/2022)   Received from [facility], [facility]  Social Connections: Moderately Integrated (01/25/2022)   Received from [facility], [facility]  Stress: Stress Concern Present (01/25/2022)   Received from [facility], [facility]  Tobacco Use: Low Risk  (12/31/2022)    Readmission Risk Interventions    01/01/2023    3:04 PM  Readmission Risk Prevention Plan  Post Dischage Appt Complete  Medication Screening Complete  Transportation Screening Complete

## 2023-01-04 NOTE — Progress Notes (Signed)
Family at the bedside states concern about the pt's breathing. She states that the pt is breathing rapid and shallow. Assessment done. Pt is resting quietly, respirations equal, rate of 22 cycles per minute. SPO2 96% at room air, lung sounds clear.

## 2023-01-04 NOTE — Progress Notes (Signed)
Bladder scan showed.>475ml, So, placed 14Fr foley catheter as per order, patient tolerated well, of output noted, will continue to monitor

## 2023-01-04 NOTE — Procedures (Addendum)
PROCEDURE SUMMARY:  Successful LP at L5-S1.  Initially fluid blood-tinged but cleared to colorless fluid.  Opening pressure 17.  Total of 12mL removed for sample.  No complications.  EBL 0mL  Hoyt Koch PA-C 01/04/2023 3:24 PM

## 2023-01-04 NOTE — Plan of Care (Signed)
  Problem: Education: Goal: Knowledge of General Education information will improve Description Including pain rating scale, medication(s)/side effects and non-pharmacologic comfort measures Outcome: Progressing   Problem: Elimination: Goal: Will not experience complications related to bowel motility Outcome: Progressing Goal: Will not experience complications related to urinary retention Outcome: Progressing   Problem: Safety: Goal: Ability to remain free from injury will improve Outcome: Progressing   Problem: Skin Integrity: Goal: Risk for impaired skin integrity will decrease Outcome: Progressing

## 2023-01-04 NOTE — Progress Notes (Signed)
LTM EEG discontinued - no skin breakdown at unhook.   

## 2023-01-04 NOTE — Progress Notes (Signed)
Patient back to the unit.

## 2023-01-04 NOTE — Progress Notes (Signed)
Patient off the unit for LP.

## 2023-01-04 NOTE — Progress Notes (Signed)
   01/04/23 2219  Assess: MEWS Score  BP (!) 120/91  MAP (mmHg) 99  Pulse Rate (!) 131  ECG Heart Rate (!) 144  Resp (!) 22  FiO2 (%) 21 %   Vital signs above showing Red MEWS. PRN Metoprolol 2.5mg  IV given. Provider notified. Will monitor

## 2023-01-04 NOTE — Progress Notes (Addendum)
Neurology Progress Note  Brief HPI: [name] is a 89 y.o. female with a medical history significant for chronic diastolic heart failure, HTN, OSA on CPAP, seizure disorder on home Keppra (previously on Dilantin but this was discontinued due to Dilantin toxicity with subsequent dizziness and recurrent falls in 2020) who presented to the ED on 9/8 for evaluation of fatigue, weakness, and confusion.  Patient initially began to complain of increased fatigue on 9/7 and on 9/8 family noted that patient was acutely confused and weak as she is unable to get herself up to the restroom. On arrival to the ED, the patient was noted to only be answering "yes" or "no" questions without further verbalization.  Initial workup at [facility] revealed febrile patient to 101.5 F, tachycardia, and hypertensive blood pressures with SBP up to 204 mmHg.  CBC was without leukocytosis.  UA negative for acute UTI.  Respiratory panel unremarkable.  EEG revealed cortical dysfunction of the left hemisphere consistent with structural abnormality versus postictal state with additional mild diffuse encephalopathy.  Due to neurologic deficits on exam 9/9 including aphasia, anomia, and left gaze preference in the setting of a negative MRI, patient was transferred to [facility] hospital for long-term EEG monitoring and further neurologic work up and evaluation.    At baseline, patient has no reported cognitive impairment and lives with her son.  She reportedly is very sharp and is independent with her ADLs.  Patient's son notes that she vacuums the house on a daily basis in her usual state of health.  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   It is unclear when/why keppra was changed from 500 BID to 500 daily but she has been tolerating this dose well for  a long time now; this medication is currently managed by her PCP   Prior AEDS: Dilantin -- stopped due to side effects Vimpat -- stopped to due to dizziness    Current Facility-Administered Medications:    0.9 %  sodium chloride infusion, , Intravenous, Continuous, Hammons, Kimberly B, RPH   acetaminophen (TYLENOL) suppository 650 mg, 650 mg, Rectal, Q4H PRN **OR** acetaminophen (TYLENOL) tablet 650 mg, 650 mg, Oral, Q4H PRN, Rolly Salter, MD   acyclovir (ZOVIRAX) 485 mg in dextrose 5 % 100 mL IVPB, 10 mg/kg, Intravenous, Q12H, Pham, Anh P, RPH, Stopped at 01/03/23 2311   ampicillin (OMNIPEN) 2 g in sodium chloride 0.9 % 100 mL IVPB, 2 g, Intravenous, Q6H, Rolly Salter, MD, Last Rate: 300 mL/hr at 01/04/23 0840, 2 g at 01/04/23 0840   aspirin suppository 300 mg, 300 mg, Rectal, Daily, Mahala Menghini, Jai-Gurmukh, MD, 300 mg at 01/03/23 1419   cefTRIAXone (ROCEPHIN) 2 g in sodium chloride 0.9 % 100 mL IVPB, 2 g, Intravenous, Q12H, Rolly Salter, MD, Stopped at 01/03/23 2348   cloNIDine (CATAPRES - Dosed in mg/24 hr) patch 0.1 mg, 0.1 mg, Transdermal, Weekly, Rolly Salter, MD, 0.1 mg at 01/02/23 1223   cyanocobalamin (VITAMIN B12) tablet 1,000 mcg, 1,000 mcg, Oral, Daily, Sheneika Walstad L, MD   enoxaparin (LOVENOX) injection 30 mg, 30 mg, Subcutaneous, Q24H, Milka Windholz L, MD   fentaNYL (SUBLIMAZE) injection 6.5 mcg, 6.5 mcg, Intravenous, Once PRN, Benedicto Capozzi L, MD   hydrALAZINE (APRESOLINE) injection 10 mg, 10 mg, Intravenous, Q4H PRN, Rolly Salter,  MD, 10 mg at 01/04/23 0834   levETIRAcetam (KEPPRA) IVPB 500 mg/100 mL premix, 500 mg, Intravenous, Q12H, Rolly Salter, MD, Stopped at 01/04/23 0030   metoprolol tartrate (LOPRESSOR) injection 2.5 mg, 2.5 mg, Intravenous, Q5 min PRN, Tu, Ching T, DO, 2.5 mg at 01/02/23 0812   nitroGLYCERIN (NITROGLYN) 2 % ointment 1 inch, 1 inch, Topical, Q6H, Rolly Salter, MD, 1 inch at 01/04/23 0241   sodium chloride flush (NS) 0.9 %  injection 10-40 mL, 10-40 mL, Intracatheter, Q12H, Ghimire, Lyndel Safe, MD, 10 mL at 01/03/23 2216   sodium chloride flush (NS) 0.9 % injection 10-40 mL, 10-40 mL, Intracatheter, PRN, Dorcas Carrow, MD   thiamine (VITAMIN B1) 500 mg in sodium chloride 0.9 % 50 mL IVPB, 500 mg, Intravenous, Daily, Pilar Westergaard L, MD, Stopped at 01/03/23 2025   vancomycin (VANCOREADY) IVPB 750 mg/150 mL, 750 mg, Intravenous, Q24H, Pham, Minh Q, RPH-CPP, Stopped at 01/03/23 2205   Subjective: Low grade fever. 99.3. Hypertensive today as well. Family at the bedside states that they had a good conversation this morning and then she became tired. Was able to recognize her this morning and remembered going to the hospital Sunday. On my exam she is drowsy but does awaken to voice.  Worsening hypertension  Exam: Vitals:   01/04/23 0751 01/04/23 0830  BP: (!) 194/100 (!) 192/93  Pulse: 62   Resp:    Temp: 99.3 F (37.4 C)   SpO2: 96%    Constitutional: Thin Caucasian female, acutely ill-appearing MSK: Left lower extremity has a gauze wrap bandage in place without notable drainage Tenderness with palpation of neck, neck is stiff  Cardiovascular: Sinus tachycardia, extremities are warm Respiratory: Wheezing audibly, appears short of breath  GI: Soft.  No distension. There is no tenderness.  Skin: Warm dry and intact visible skin   Neuro: Mental Status: Patient awake, states name and age correctly. Knows her birthday is in [birth year]. Unable to tell me location, situation, date. On attending exam later, states she is in [place], cannot state hospital, much more interactive then 9/11 generally Cranial Nerves: II: PERRL III,IV, VI: EOMI to orienting to examiner VII: Facial movement is symmetric.  VIII: Hearing is intact to voice X: Patient is hypophonic XI: Head is turned leftward on exam. Resists turn to the right for NP. Head midline for MD XII: Does not protrude tongue to command Motor/Sensory: Tone is  normal. Bulk is decreased though appropriate for age. Bilateral upper extremities will elevate antigravity for 10 seconds.   Bilateral lower extremities elevate antigravity for 5 seconds with assistance in initially lifting them Cerebellar: Does not perform  Gait:  Deferred for patient safety in the setting of acute alteration in mental status   Basic Metabolic Panel: Recent Labs  Lab 12/31/22 2032 01/01/23 0509 01/02/23 0821 01/03/23 0755  NA 134* 132* 132* 135  K 3.5 3.1* 4.2 2.3*  CL 97* 96* 98 101  CO2 25 22 19* 23  GLUCOSE 118* 93 110* 124*  BUN 14 11 17 22   CREATININE 0.60 0.59 0.63 0.73  CALCIUM 9.5 8.5* 8.5* 8.4*  MG  --   --  1.9  --     CBC: Recent Labs  Lab 12/31/22 2032 01/01/23 0509 01/02/23 0821 01/03/23 0755  WBC 9.2 8.7 9.4 7.5  NEUTROABS 7.6  --   --  5.8  HGB 12.9 13.1 14.0 11.6*  HCT 40.3 39.4 42.7 34.1*  MCV 91.4 90.6 89.7 89.5  PLT 165 145* 125* 130*  Coagulation Studies: Recent Labs    01/03/23 0755  LABPROT 16.1*  INR 1.3*    B12 343 HIV neg RPR neg Ammonia 31 (neg)   Imaging Reviewed: MRI brain 01/01/23:  No acute or reversible finding. Moderate chronic small-vessel ischemic changes of the pons and cerebral hemispheric white matter.    Routine EEG 01/01/23:  This study is cortical dysfunction arising from left hemisphere likely secondary to underlying structural abnormality, post-ictal state. Additionally there is mild diffuse encephalopathy, nonspecific etiology. No seizures or definite epileptiform discharges were seen throughout the recording.  LTM EEG 01/02/2023 1815 to 01/03/2023 0600: ABNORMALITY - Spikes, generalized and maximal right frontal  - Continuous slow, generalized and lateralized left hemisphere - Background asymmetry, left<right IMPRESSION: This study showed evidence of epileptogenicity with generalized and maximal right frontal onset. Additionally there was cortical dysfunction arising from left hemisphere  likely secondary to underlying structural abnormality, post-ictal state. Additionally there is mild diffuse encephalopathy, nonspecific etiology. No seizures or definite epileptiform discharges were seen throughout the recording.  LTM EEG 01/03/2023- read pending   Assessment: 89 y.o. female with PMHx of chronic diastolic heart failure, HTN, OSA on CPAP, and seizure disorder on home Keppra who presented to the ED on 9/8 for evaluation of acute alteration in mental status with fatigue and confusion and was found to be septic.  Patient had ongoing neurologic deficits this morning including aphasia, anomia, and concern for a new left gaze preference.  Due to ongoing neurologic deficits in the setting of sepsis and a negative MRI, patient was transferred to [facility] for further neurology workup and LTM EEG monitoring and broadened to meningitis coverage.  Neuro exam continues to slightly improved today, remains hypertensive with low grade fever -- also now having increased respiratory symptoms on exam As mental status is improving, continued neck pain out of proportion to what I'd expect for meningitis after several days of treatment, will obtain further imaging  Impression: Fever of unclear etiology, improving on broad spectrum meningitic coverage Mild respiratory distress new this morning Worsening hypertension Severe neck pain Well controlled seizures  Recommendations: - DC LTM - MRI C-spine w/ and w/o to rule out abscess  - Continue Seizure precautions - Continue Keppra 500 mg daily, IV or PO as patient tolerates - Patient will need an LP with CSF studies, failed bedside today, planned for fluoro LP. Interpretation will be challenging after multiple days of antibiotics  - Lovenox retimed to 2200 next dose 9/12 to facilitate LP - CSF: culture with gram stain, cell count with differential in tubes 1 and 4, protein, glucose, meningitis/encephalitis panel, - B1 pending, start empiric thiamine  (500 mg IV daily for now for 3 doses, 100 mg daily IV or PO if results low, dc if level results normal) - B12 low normal, 1000 mg daily supplementation for goal > 1000 - Continue meningitis coverage, depending on most likely organism treatment range is typically 7-14 days though some select organisms are adequately treated after 5 days - May need ID consult to help determine further antibiotic treatment tomorrow, pending preliminary CSF results  - Appreciate primary team management of BP (goal normotension) and respiratory status as well as other comorbidities - Neurology will continue to follow, discussed with primary via secure chat   Patient seen and examined by NP/APP with MD. MD to update note as needed.   Elmer Picker, DNP, FNP-BC [facility] Pager: [phone]  Attending Neurologist's note:  I personally saw this patient, gathering history, performing a  full neurologic examination, reviewing relevant labs, personally reviewing relevant imaging including MRI brain, and formulated the assessment and plan, adding the note above for completeness and clarity to accurately reflect my thoughts

## 2023-01-04 NOTE — Progress Notes (Signed)
Patient off the unit for MRI.

## 2023-01-04 NOTE — Progress Notes (Signed)
SLP Cancellation Note  Patient Details Name: [name] MRN: [id number] DOB: June 02, 1929   Cancelled treatment:       Reason Eval/Treat Not Completed: Patient's level of consciousness. Patient is asleep, sedated from LP. SLP will continue to follow.  Angela Nevin, MA, CCC-SLP Speech Therapy

## 2023-01-04 NOTE — Progress Notes (Signed)
Daily Progress Note   Patient Name: [name]       Date: 01/04/2023 DOB: April 07, 1930  Age: 89 y.o. MRN#: [id number] Attending Physician: [name], MD Primary Care Physician: Tracey Harries, MD Admit Date: 12/31/2022  Reason for Consultation/Follow-up: Establishing goals of care  Subjective: Medical records reviewed including progress notes, labs, imaging. Patient assessed at the bedside. No family present during my visit.  Called patient's daughter-in-law Marylene Land for ongoing palliative support. Created space and opportunity for her thoughts and feelings on patient's current illness.  She has no further questions after our conversation yesterday, appreciates all the support received.  She understands current plan for MRI and LP today and remains interested in ongoing workup with hope for further improvement.  Shared that I would not be on service until 9/17 and she is agreeable to reconvening at that time based on clinical course.  Encouraged her to reach out to PMT should needs arise in the interim.  Questions and concerns addressed. PMT will continue to support holistically.   Length of Stay: 3   Physical Exam Vitals and nursing note reviewed.  Constitutional:      General: She is in acute distress.     Appearance: She is ill-appearing.  Cardiovascular:     Rate and Rhythm: Normal rate.  Pulmonary:     Effort: Pulmonary effort is normal. No respiratory distress.  Neurological:     Mental Status: She is alert. She is disoriented and confused.            Vital Signs: BP (!) 192/93 (BP Location: Right Arm)   Pulse 62   Temp 99.3 F (37.4 C) (Oral)   Resp 16   Ht 4\' 10"  (1.473 m)   Wt 48.3 kg   LMP  (LMP Unknown)   SpO2 96%   BMI 22.25 kg/m  SpO2: SpO2: 96  % O2 Device: O2 Device: Room Air O2 Flow Rate:        Palliative Assessment/Data: TBD   Palliative Care Assessment & Plan   Patient Profile: 89 y.o. 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    Patient initially presented to [facility] and was found to meet SIRS criteria with fever, A-fib  with RVR, acute metabolic encephalopathy, lactic acidosis. EEG revealed cortical dysfunction of the left hemisphere consistent with structural abnormality versus postictal state with additional mild diffuse encephalopathy.  Due to neurologic deficits on exam 9/9 including aphasia, anomia, and left gaze preference in the setting of a negative MRI, patient was transferred to [facility] hospital for long-term EEG monitoring and further neurologic work up and evaluation.    PMT has been consulted to assist with goals of care conversation.  Assessment: Goals of care conversation Concern for meningitis Seizure disorder on home Keppra  Recommendations/Plan: Continue DNR/DNI Continue current care Patient's family remains hopeful for improvement.  Allow additional time for outcomes Patient would not find it acceptable to have persistent cognitive deficits; discuss possible comfort care and hospice if no improvement  PMT will continue to follow and support.  Next visit on 9/17.  Please do not hesitate to reach out if urgent needs arise   Prognosis: Guarded  Discharge Planning: To Be Determined  Care plan was discussed with patient's daughter-in-law   Total time: I spent 35 minutes in the care of the patient today in the above activities and documenting the encounter.          Oniel Meleski Jeni Salles, PA-C  Palliative Medicine Team Team phone # [phone]  Thank you for allowing the Palliative Medicine Team to assist in the care of this patient. Please utilize  secure chat with additional questions, if there is no response within 30 minutes please call the above phone number.  Palliative Medicine Team providers are available by phone from 7am to 7pm daily and can be reached through the team cell phone.  Should this patient require assistance outside of these hours, please call the patient's attending physician.

## 2023-01-04 NOTE — Procedures (Addendum)
Patient Name: [name]  MRN: [id number]  Epilepsy Attending: Charlsie Quest  Referring Physician/Provider: Kara Mead, NP  Duration: 01/03/2023 1815 to 01/04/2023 1003   Patient history: 89 y.o. female with PMHx of chronic diastolic heart failure, HTN, OSA on CPAP, and seizure disorder on home Keppra who presented to the ED on 9/8 for evaluation of acute alteration in mental status with fatigue and confusion and was found to be septic. EEG to evaluate for seizure.   Level of alertness: Awake   AEDs during EEG study: LEV   Technical aspects: This EEG study was done with scalp electrodes positioned according to the 10-20 International system of electrode placement. Electrical activity was reviewed with band pass filter of 1-70Hz , sensitivity of 7 uV/mm, display speed of 69mm/sec with a 60Hz  notched filter applied as appropriate. EEG data were recorded continuously and digitally stored.  Video monitoring was available and reviewed as appropriate.   Description: The posterior dominant rhythm consists of 7 Hz activity of moderate voltage (25-35 uV) seen predominantly in posterior head regions, asymmetric ( left<right) and reactive to eye opening and eye closing. EEG showed continuous low amplitude 2-3hz  delta slowing as well as 6-7hz  theta slowing in right hemisphere. Hyperventilation and photic stimulation were not performed.      ABNORMALITY - Continuous slow, generalized and lateralized left hemisphere - Background asymmetry, left<right   IMPRESSION: This study showed evidence cortical dysfunction arising from left hemisphere likely secondary to underlying structural abnormality, post-ictal state. Additionally there is mild diffuse encephalopathy, nonspecific etiology. No seizures or definite epileptiform discharges were seen throughout the recording.   Caia Lofaro [name]

## 2023-01-05 DIAGNOSIS — R6510 Systemic inflammatory response syndrome (SIRS) of non-infectious origin without acute organ dysfunction: Secondary | ICD-10-CM | POA: Diagnosis not present

## 2023-01-05 LAB — GLUCOSE, CAPILLARY
Glucose-Capillary: 177 mg/dL — ABNORMAL HIGH (ref 70–99)
Glucose-Capillary: 69 mg/dL — ABNORMAL LOW (ref 70–99)
Glucose-Capillary: 72 mg/dL (ref 70–99)
Glucose-Capillary: 82 mg/dL (ref 70–99)
Glucose-Capillary: 86 mg/dL (ref 70–99)
Glucose-Capillary: 96 mg/dL (ref 70–99)

## 2023-01-05 LAB — MENINGITIS/ENCEPHALITIS PANEL (CSF)
Cryptococcus neoformans/gattii (CSF): NOT DETECTED
Cryptococcus neoformans/gattii (CSF): NOT DETECTED
Cytomegalovirus (CSF): NOT DETECTED
Cytomegalovirus (CSF): NOT DETECTED
Enterovirus (CSF): NOT DETECTED
Enterovirus (CSF): NOT DETECTED
Escherichia coli K1 (CSF): NOT DETECTED
Escherichia coli K1 (CSF): NOT DETECTED
Haemophilus influenzae (CSF): NOT DETECTED
Haemophilus influenzae (CSF): NOT DETECTED
Herpes simplex virus 1 (CSF): NOT DETECTED
Herpes simplex virus 1 (CSF): NOT DETECTED
Herpes simplex virus 2 (CSF): NOT DETECTED
Herpes simplex virus 2 (CSF): NOT DETECTED
Human herpesvirus 6 (CSF): NOT DETECTED
Human herpesvirus 6 (CSF): NOT DETECTED
Human parechovirus (CSF): NOT DETECTED
Human parechovirus (CSF): NOT DETECTED
Listeria monocytogenes (CSF): NOT DETECTED
Listeria monocytogenes (CSF): NOT DETECTED
Neisseria meningitis (CSF): NOT DETECTED
Neisseria meningitis (CSF): NOT DETECTED
Streptococcus agalactiae (CSF): NOT DETECTED
Streptococcus agalactiae (CSF): NOT DETECTED
Streptococcus pneumoniae (CSF): NOT DETECTED
Streptococcus pneumoniae (CSF): NOT DETECTED
Varicella zoster virus (CSF): NOT DETECTED
Varicella zoster virus (CSF): NOT DETECTED

## 2023-01-05 LAB — CULTURE, BLOOD (ROUTINE X 2): Special Requests: ADEQUATE

## 2023-01-05 LAB — MAGNESIUM: Magnesium: 1.6 mg/dL — ABNORMAL LOW (ref 1.7–2.4)

## 2023-01-05 LAB — VDRL, CSF: VDRL Quant, CSF: NONREACTIVE

## 2023-01-05 LAB — CYTOLOGY - NON PAP

## 2023-01-05 MED ORDER — LORAZEPAM 2 MG/ML PO CONC: 1.0000 mg | ORAL | Status: DC | PRN

## 2023-01-05 MED ORDER — ALBUTEROL SULFATE (2.5 MG/3ML) 0.083% IN NEBU: 2.5000 mg | INHALATION_SOLUTION | RESPIRATORY_TRACT | Status: DC | PRN

## 2023-01-05 MED ORDER — MORPHINE SULFATE (CONCENTRATE) 10 MG/0.5ML PO SOLN: 5.0000 mg | ORAL | Status: DC | PRN

## 2023-01-05 MED ORDER — LORAZEPAM 1 MG PO TABS: 1.0000 mg | ORAL_TABLET | Freq: Four times a day (QID) | ORAL | Status: DC | PRN

## 2023-01-05 MED ORDER — OLANZAPINE 5 MG PO TBDP: 5.0000 mg | ORAL_TABLET | Freq: Every day | ORAL | 0 refills | Status: AC

## 2023-01-05 MED ORDER — ATROPINE SULFATE 1 % OP SOLN: 4.0000 [drp] | OPHTHALMIC | Status: DC | PRN

## 2023-01-05 MED ORDER — OLANZAPINE 5 MG PO TBDP
5.0000 mg | ORAL_TABLET | Freq: Every day | ORAL | Status: DC
  Administered 2023-01-05: 5 mg via ORAL
  Filled 2023-01-05: qty 1

## 2023-01-05 MED ORDER — MORPHINE SULFATE (PF) 2 MG/ML IV SOLN
2.0000 mg | INTRAVENOUS | Status: DC | PRN
  Administered 2023-01-05: 2 mg via INTRAVENOUS
  Filled 2023-01-05: qty 1

## 2023-01-05 MED ORDER — LORAZEPAM 1 MG PO TABS: 1.0000 mg | ORAL_TABLET | ORAL | 0 refills | Status: AC | PRN

## 2023-01-05 MED ORDER — BIOTENE DRY MOUTH MT LIQD: 15.0000 mL | OROMUCOSAL | Status: DC | PRN

## 2023-01-05 MED ORDER — LORAZEPAM 2 MG/ML IJ SOLN
1.0000 mg | INTRAMUSCULAR | Status: DC | PRN
  Administered 2023-01-05: 1 mg via INTRAVENOUS
  Filled 2023-01-05: qty 1

## 2023-01-05 MED ORDER — DEXTROSE 50 % IV SOLN
1.0000 | Freq: Once | INTRAVENOUS | Status: AC
  Administered 2023-01-05: 50 mL via INTRAVENOUS
  Filled 2023-01-05: qty 50

## 2023-01-05 MED ORDER — MORPHINE SULFATE (CONCENTRATE) 10 MG/0.5ML PO SOLN
5.0000 mg | ORAL | Status: DC | PRN
  Filled 2023-01-05: qty 0.5

## 2023-01-05 MED ORDER — LORAZEPAM 1 MG PO TABS: 1.0000 mg | ORAL_TABLET | ORAL | Status: DC | PRN

## 2023-01-05 MED ORDER — MORPHINE SULFATE (CONCENTRATE) 10 MG/0.5ML PO SOLN: 5.0000 mg | ORAL | 0 refills | Status: AC | PRN

## 2023-01-05 NOTE — Plan of Care (Signed)
Patient is now hospice level of care. Please call neurology with questions as needed. Plan discussed with Dr. Mahala Menghini   -- Milon Dikes, MD Neurologist [facility] Pager: [phone]

## 2023-01-05 NOTE — TOC Transition Note (Signed)
Transition of Care [facility]) - CM/SW Discharge Note   Patient Details  Name: [name] MRN: [id number] Date of Birth: 10-13-1929  Transition of [facility]) CM/SW Contact:  Kermit Balo, RN Phone Number: 01/05/2023, 4:12 PM   Clinical Narrative:     Pt is discharging to [facility] today. Family is aware. Pt will transport via [facility].   Number for report: [phone]  Final next level of care: Hospice Medical Facility Barriers to Discharge: No Barriers Identified   Patient Goals and CMS Choice CMS Medicare.gov Compare Post Acute Care list provided to:: Patient Represenative (must comment) Choice offered to / list presented to : Adult Children  Discharge Placement                         Discharge Plan and Services Additional resources added to the After Visit Summary for     Discharge Planning Services: CM Consult Post Acute Care Choice: Hospice                               Social Determinants of Health (SDOH) Interventions SDOH Screenings   Food Insecurity: No Food Insecurity (01/01/2023)  Housing: Low Risk  (01/01/2023)  Transportation Needs: No Transportation Needs (01/01/2023)  Utilities: Not At Risk (01/01/2023)  Financial Resource Strain: Low Risk  (08/08/2022)   Received from [facility], [facility]  Physical Activity: Insufficiently Active (01/25/2022)   Received from [facility], [facility]  Social Connections: Moderately Integrated (01/25/2022)   Received from [facility], [facility]  Stress: Stress Concern Present (01/25/2022)   Received from [place], [facility]  Tobacco Use: Low Risk  (12/31/2022)     Readmission Risk Interventions    01/01/2023    3:04 PM  Readmission Risk Prevention Plan  Post Dischage Appt Complete  Medication Screening Complete  Transportation Screening Complete

## 2023-01-05 NOTE — Consult Note (Signed)
CARDIOLOGY CONSULT NOTE  Patient ID: [name] MRN: [id number] DOB/AGE: Nov 13, 1929 89 y.o.  Admit date: 12/31/2022 Referring Physician  Pleas Koch, MD Primary Physician:  Tracey Harries, MD Reason for Consultation  New onset A. Fib with RVR  Patient ID: [name], female    DOB: 22-Dec-1929, 89 y.o.   MRN: [id number]  Chief Complaint  Patient presents with   Weakness   HPI:    [name]  is a 89 y.o. Caucasian female patient who is fairly active, was recently seen by Dr. Rosemary Holms, was doing well without recurrence of angina pectoris, has history of NSTEMI treated medically, in view of advanced age, not on any aggressive medical therapy either.  Now admitted with osteomyelitis of the spine and meningitis, was found to be in A-fib with RVR, I was asked to see the patient in a plan regarding atrial fibrillation.  Past Medical History:  Diagnosis Date   Abdominal pain, other specified site    Allergic rhinitis    Anemia    Arthritis    CAD (coronary artery disease) 12/2017   s/p stent   Chronic kidney disease    stage 3 kidney disease per patient on 05/12/20   Diastolic CHF (HCC) 12/2017   grade 1   Essential hypertension, benign 06/17/2018   History of non-ST elevation myocardial infarction (NSTEMI) 06/17/2018   HLD (hyperlipidemia)    Hypertension    Irregular heart rate    Ischemic cardiomyopathy 06/17/2018   Myocardial infarction (HCC) 12/2017    [facility]   OSA (obstructive sleep apnea)    uses adaptive servo ventilation machine at night   Pneumonia    Seizure disorder (HCC)    last seizure in the 1950s-well controlled with keppra   Skin cancer    left arm   Past Surgical History:  Procedure Laterality Date   BACK SURGERY     CARDIAC CATHETERIZATION  02/09/2000   CHOLECYSTECTOMY     EYE SURGERY Bilateral    cataracts removed   FEMUR IM NAIL Right 03/18/2021   Procedure: INTRAMEDULLARY (IM) NAIL FEMORAL;  Surgeon: Yolonda Kida,  MD;  Location: [facility];  Service: Orthopedics;  Laterality: Right;   HARDWARE REMOVAL Right 05/14/2020   Procedure: HARDWARE REMOVAL RIGHT FEMUR;  Surgeon: Roby Lofts, MD;  Location: [facility];  Service: Orthopedics;  Laterality: Right;   KNEE SURGERY     laparoscopic   ORIF FEMUR FRACTURE Right 12/15/2019   Procedure: OPEN REDUCTION INTERNAL FIXATION (ORIF) DISTAL FEMUR FRACTURE;  Surgeon: Roby Lofts, MD;  Location: [facility];  Service: Orthopedics;  Laterality: Right;   TOTAL ABDOMINAL HYSTERECTOMY     TOTAL KNEE ARTHROPLASTY Right 11/15/2020   Procedure: TOTAL KNEE ARTHROPLASTY;  Surgeon: Ollen Gross, MD;  Location: [facility];  Service: Orthopedics;  Laterality: Right;    WRIST FRACTURE SURGERY Bilateral    x 2   Social History   Tobacco Use   Smoking status: Never   Smokeless tobacco: Never  Substance Use Topics   Alcohol use: No    Family History  Problem Relation Age of Onset   Heart disease Father    Skin cancer Father    Skin cancer Mother    Heart attack Mother    Stroke Sister    Congenital heart disease Niece    Renal Disease Niece     Marital Status: Widowed  ROS  Review of Systems  Cardiovascular:  Negative for chest pain, dyspnea on exertion  and leg swelling.  Musculoskeletal:  Positive for back pain, joint pain and myalgias.   Objective      01/05/2023    7:20 AM 01/05/2023    3:49 AM 01/04/2023   11:59 PM  Vitals with BMI  Systolic 154 118 160  Diastolic 83 73 77  Pulse 94 114 110    Blood pressure (!) 154/83, pulse 94, temperature 98.8 F (37.1 C), temperature source Axillary, resp. rate 19, height 4\' 10"  (1.473 m), weight 48.3 kg, SpO2 98%.    Physical Exam Constitutional:      Appearance: She is underweight. She is ill-appearing.     Comments: Frail and sedate but easily responds appropriately  Neck:     Vascular: No carotid bruit or JVD.  Cardiovascular:     Rate and Rhythm: Tachycardia present. Rhythm irregular.     Pulses:           Dorsalis pedis pulses are 0 on the right side and 0 on the left side.       Posterior tibial pulses are 0 on the right side and 0 on the left side.     Heart sounds: No murmur heard. Pulmonary:     Effort: Pulmonary effort is normal.     Breath sounds: Normal breath sounds.  Abdominal:     General: Bowel sounds are normal.     Palpations: Abdomen is soft.  Musculoskeletal:     Right lower leg: No edema.     Left lower leg: No edema.  Skin:    Capillary Refill: Capillary refill takes less than 2 seconds.    Laboratory examination:   Recent Labs    01/02/23 0821 01/03/23 0755 01/04/23 0951  NA 132* 135 136  K 4.2 2.3* 2.8*  CL 98 101 102  CO2 19* 23 20*  GLUCOSE 110* 124* 184*  BUN 17 22 13   CREATININE 0.63 0.73 0.64  CALCIUM 8.5* 8.4* 8.2*  GFRNONAA >60 >60 >60   estimated creatinine clearance is 30.2 mL/min (by C-G formula based on SCr of 0.64 mg/dL).     Latest Ref Rng & Units 01/04/2023    9:51 AM 01/03/2023    7:55 AM 01/02/2023    8:21 AM  CMP  Glucose 70 - 99 mg/dL 109  323  557   BUN 8 - 23 mg/dL 13  22  17    Creatinine 0.44 - 1.00 mg/dL 3.22  0.25  4.27   Sodium 135 - 145 mmol/L 136  135  132   Potassium 3.5 - 5.1 mmol/L 2.8  2.3  4.2   Chloride 98 - 111 mmol/L 102  101  98   CO2 22 - 32 mmol/L 20  23  19    Calcium 8.9 - 10.3 mg/dL 8.2  8.4  8.5   Total Protein 6.5 - 8.1 g/dL  5.7    Total Bilirubin 0.3 - 1.2 mg/dL  0.7    Alkaline Phos 38 - 126 U/L  56    AST 15 - 41 U/L  20    ALT 0 - 44 U/L  14        Latest Ref Rng & Units 01/03/2023    7:55 AM 01/02/2023    8:21 AM 01/01/2023    5:09 AM  CBC  WBC 4.0 - 10.5 K/uL 7.5  9.4  8.7   Hemoglobin 12.0 - 15.0 g/dL 06.2  37.6  28.3   Hematocrit 36.0 - 46.0 % 34.1  42.7  39.4  Platelets 150 - 400 K/uL 130  125  145    Lipid Panel No results for input(s): "CHOL", "TRIG", "LDLCALC", "VLDL", "HDL", "CHOLHDL", "LDLDIRECT" in the last 8760 hours.  HEMOGLOBIN A1C No results found for: "HGBA1C",  "MPG" TSH Recent Labs    01/01/23 0808  TSH 2.260   BNP (last 3 results) Recent Labs    12/31/22 2233  BNP 496.1*   Cardiac Panel (last 3 results) No results for input(s): "CKTOTAL", "CKMB", "TROPONINIHS", "RELINDX" in the last 72 hours.   Medications and allergies   Allergies  Allergen Reactions   Codeine Hypertension and Other (See Comments)    Causes a sensation of "pressure" within her head  Other Reaction(s): Not available, Other, Unknown  Other Reaction(s): Hypertension    Causes a sensation of "pressure" within her head    Causes a sensation of "pressure" within her head Causes a sensation of "pressure" within her head Causes a sensation of "pressure" within her head   Latex Itching    Other Reaction(s): Unknown   Tape Itching   Wound Dressing Adhesive Itching     Current Meds  Medication Sig   aspirin EC 81 MG tablet Take 81 mg by mouth daily. Swallow whole.   diphenoxylate-atropine (LOMOTIL) 2.5-0.025 MG tablet Take 1 tablet by mouth 4 (four) times daily as needed for diarrhea or loose stools.   ezetimibe (ZETIA) 10 MG tablet Take 1 tablet (10 mg total) by mouth daily.   gabapentin (NEURONTIN) 100 MG capsule Take 100 mg by mouth at bedtime.   levETIRAcetam (KEPPRA) 500 MG tablet Take 1 tablet (500 mg total) by mouth 2 (two) times daily. (Patient taking differently: Take 500 mg by mouth daily.)   nitroGLYCERIN (NITROSTAT) 0.4 MG SL tablet Place 1 tablet (0.4 mg total) under the tongue every 5 (five) minutes as needed for chest pain.   oxybutynin (DITROPAN XL) 15 MG 24 hr tablet Take 15 mg by mouth at bedtime.    Scheduled Meds:  aspirin  300 mg Rectal Daily   cloNIDine  0.1 mg Transdermal Weekly   enoxaparin (LOVENOX) injection  30 mg Subcutaneous Q24H   lidocaine  1 patch Transdermal Q24H   nitroGLYCERIN  1 inch Topical Q6H   sodium chloride flush  10-40 mL Intracatheter Q12H   Continuous Infusions:  acyclovir 485 mg (01/05/23 0922)   ampicillin  (OMNIPEN) IV 2 g (01/05/23 0815)   cefTRIAXone (ROCEPHIN)  IV 2 g (01/05/23 0932)   levETIRAcetam Stopped (01/04/23 2354)   thiamine (VITAMIN B1) injection Stopped (01/04/23 1123)   vancomycin Stopped (01/04/23 2046)   PRN Meds:.acetaminophen **OR** acetaminophen, fentaNYL (SUBLIMAZE) injection, hydrALAZINE, metoprolol tartrate, sodium chloride flush   I/O last 3 completed shifts: In: 4108.1 [I.V.:2084.9; IV Piggyback:2023.2] Out: 1300 [Urine:1300] No intake/output data recorded.  Net IO Since Admission: 4,755.96 mL [01/05/23 1011]   Radiology:   MR CERVICAL SPINE W WO CONTRAST 01/04/2023  1. Contrast enhancement associated with the C6 and C7 vertebral bodies and C6-C7 disc space, as well as the C6 and C7 spinous processes, is concerning for early discitis/osteomyelitis. No evidence of epidural abscess. Recommend correlation with ESR and CRP. 2. Moderate spinal canal narrowing at C3-C4.   Cardiac Studies:   Echocardiogram 01/01/2023  1. Difficult acoustic windows. EF challenging with an atrial arrhythmia. Left ventricular ejection fraction, by estimation, is 40 to 45%. The left ventricle has mildly decreased function. The left ventricle demonstrates global hypokinesis. Left ventricular diastolic parameters are indeterminate.  2. Right ventricular systolic function is  normal. The right ventricular size is normal.  3. Left atrial size was severely dilated.  4. Right atrial size was severely dilated.  5. The mitral valve is degenerative. No evidence of mitral valve regurgitation.  6. The aortic valve is tricuspid. Aortic valve regurgitation is not visualized.  7. The inferior vena cava is normal in size with <50% respiratory variability, suggesting right atrial pressure of 8 mmHg.  EKG:  EKG 12/31/2022: Atrial fibrillation/atypical atrial flutter with variable AV conduction, ventricular rate 116 bpm.  Normal axis.  Nonspecific T abnormality.  Single PVC.  Compared to 02/01/2022, sinus rhythm  has been replaced.  Assessment   1.  Atrial fibrillation with rapid ventricular response 2.  Osteomyelitis of the spine along with early discitis findings by MRI of the cervical spine, meningitis and sepsis. 3.  End-of-life discussions  Recommendations:   Patient's son is present and patient's ex daughter-in-law also present, I had a very long discussion with them that she has osteomyelitis and she is right now in 8 out of 10 back pain, very advanced age, appears very frail and hence pain control and confusional issues becomes a major problem, also with her advanced age, with back pain, she will not removal for many weeks if not at least many days.  This will lead to potential for skin breakdown, aspiration pneumonia, other complications related to immobility as well.  In view of osteomyelitis and discitis that I am seeing and also being so frail, do not think anticoagulation would be appropriate at this time and would in fact consider palliative care/hospice care.  I had a very long discussion with the patient's family and they prefer to withdraw support and involve hospice as they feel it is appropriate not to let her suffer so we could start her on high-dose pain medications as well.  Given this, I do believe that it is appropriate to consider palliative care and comfort measures.  I have discussed with primary team as well.   Yates Decamp, MD, [facility] 01/05/2023, 10:11 AM Office: [phone]

## 2023-01-05 NOTE — Progress Notes (Signed)
HOSPITALIST ROUNDING NOTE [name] MWN:[id number]  DOB: 03/31/1930  DOA: 12/31/2022  PCP: Tracey Harries, MD  01/05/2023,1:54 PM   LOS: 4 days      Code Status: DNR   From: Home  current Dispo: Likely will require skilled care at discharge     89 year old female HFpEF HTN (EF 60-65% previously OSA/CPAP IBS with chronic diarrhea CAD status post NSTEMI 2019 previous ASA/Plavix secondary to BMS DES Prior R hip fracture 02/2021 Seizure disorder on Keppra (intolerant Dilantin 2/2 dizziness falls, intolerant Vimpat 2/2 dizzy ) Came to ED 12/31/2018 for dysuria fever became very fatigued weak-note chronic diarrhea Small wound LLE followed by wound care which was not examined at family request on admission Febrile 101.5 RVR 140s Rx multiple doses Lopressor, Rocephin for presumed UTI-MRI brain negative for any acute stroke RVP negative Eventually found to have left gaze preference anomia aphasia EEG 01/01/2023 cortical dysfunction left hemispheric 2/2 underlying structural abnormality 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 other day ABD pad 9/10 transfer [facility], neurology consulted--- LP recommended CSF Gram stain cell count RPR B1 B12 HIV and antibacterials suggested to meningitic coverage, long-term EEG monitoring performed  9/11 LP unsuccessful 9/12 LP performed under fluoroscopy-results pending Foley placed 9/13 MRI performed shows osteomyelitis versus discitis-decision made for hospice by family  Plan  Sepsis on admission secondary to discitis osteomyelitis and probable meningitis CAD NSTEMI DES 2019 Severe hypokalemia IBS-D Seizure disorder on Keppra LUTS with Foley in place now Lower extremity wound likely nidus for discitis  Long discussion with family after return of MRI and after visit by Dr. Jeanella Cara who called me after evaluating patient because of tachyarrhythmia    overnight new onset A-fib and severe agitation additionally I echoed Dr. Verl Dicker thoughts with regards to goals of care and family is interested in freestanding hospice at [facility] I have started the palliative care focused order set and discontinued no nonessential meds including antibiotics and family seems to clearly understand her frailty    DVT prophylaxis: Lovenox  Status is: Inpatient Remains inpatient appropriate because:   Requires improvement-May require skilled care    Subjective:  Rough night overnight according to family was hallucinating and was agitated according to daughter-in-law -she is little bit more awake  Family seems amenable to hospice   Objective + exam Vitals:   01/05/23 0300 01/05/23 0349 01/05/23 0720 01/05/23 1209  BP:  118/73 (!) 154/83 (!) 159/107  Pulse:  (!) 114 94 (!) 141  Resp: (!) 24 19    Temp:  98.1 F (36.7 C) 98.8 F (37.1 C) 98.3 F (36.8 C)  TempSrc:  Oral Axillary Axillary  SpO2:  97% 98% 98%  Weight:      Height:       Filed Weights   12/31/22 2112 01/01/23 0243  Weight: 57 kg 48.3 kg    Examination:  Sleeping when I saw her Tachycardic with A-fib on monitors Abdomen soft no rebound Rest of exam deferred  Data Reviewed: reviewed   CBC    Component Value Date/Time   WBC 7.5 01/03/2023 0755   RBC 3.81 (L) 01/03/2023 0755   HGB 11.6 (L) 01/03/2023 0755   HCT 34.1 (L) 01/03/2023 0755   PLT 130 (L) 01/03/2023 0755   MCV 89.5 01/03/2023 0755   MCH 30.4 01/03/2023 0755   MCHC 34.0 01/03/2023 0755   RDW 15.4 01/03/2023 0755  LYMPHSABS 0.8 01/03/2023 0755   MONOABS 0.7 01/03/2023 0755   EOSABS 0.1 01/03/2023 0755   BASOSABS 0.0 01/03/2023 0755      Latest Ref Rng & Units 01/04/2023    9:51 AM 01/03/2023    7:55 AM 01/02/2023    8:21 AM  CMP  Glucose 70 - 99 mg/dL 161  096  045   BUN 8 - 23 mg/dL 13  22  17    Creatinine 0.44 - 1.00 mg/dL 4.09  8.11  9.14   Sodium 135 - 145 mmol/L 136  135  132   Potassium  3.5 - 5.1 mmol/L 2.8  2.3  4.2   Chloride 98 - 111 mmol/L 102  101  98   CO2 22 - 32 mmol/L 20  23  19    Calcium 8.9 - 10.3 mg/dL 8.2  8.4  8.5   Total Protein 6.5 - 8.1 g/dL  5.7    Total Bilirubin 0.3 - 1.2 mg/dL  0.7    Alkaline Phos 38 - 126 U/L  56    AST 15 - 41 U/L  20    ALT 0 - 44 U/L  14       Scheduled Meds:  lidocaine  1 patch Transdermal Q24H   OLANZapine zydis  5 mg Oral Daily   Continuous Infusions:  levETIRAcetam 500 mg (01/05/23 1138)    Time 45  [name], MD  [facility]

## 2023-01-05 NOTE — Discharge Summary (Signed)
Physician Discharge Summary  [name] NFA:[id number] DOB: [birth year] DOA: 12/31/2022  PCP: Tracey Harries, MD  Admit date: 12/31/2022 Discharge date: 01/05/2023  Time spent: 40 minutes  Recommendations for Outpatient Follow-up:  Discharging for hospice level care at [facility]  Discharge Diagnoses:  MAIN problem for hospitalization   Sepsis secondary to presumed meningitis osteomyelitis discitis of C6-C7 Possible meningitis Adult failure to thrive Seizure disorder on Keppra Prior NSTEMI 2019  Please see below for itemized issues addressed in HOpsital- refer to other progress notes for clarity if needed  Discharge Condition: Guarded  Diet recommendation:   [facility] Weights   12/31/22 2112 01/01/23 0243  Weight: 57 kg 48.3 kg    History of present illness:  89 year old female multiple medical problems HFpEF EF 60-65% NSTEMI 2019 BMS Prior hip fracture 2022 IBS and multiple others Also left lower extremity wound under care of wound care as an outpatient Admitted 9/8 at [facility] dysuria fever chronic diarrhea-had subsequently a left gaze preference aphasia and on EEG 9/9 cortical dysfunction 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 Wound seemed relatively clean Kept on broad-spectrum antibiotics Patient was transferred to [facility] for eventual LP which showed predominant protein 84, glucose 54 although had been on treatment Patient was encephalopathic for some parts of hospital stay mentation vacillated-patient became more encephalopathic confused and eventually flipped into A-fib RVR cardiology consulted Long discussions with family yielded their preference for patient not to suffer further workup-patient was not a great candidate for back surgery Patient was felt to be a good candidate for [facility] and was discharged there on comfort trajectory meds DNR was signed I had long conversations with family with regards to this and they  understand the plan and trajectory   Discharge Exam: Vitals:   01/05/23 1209 01/05/23 1623  BP: (!) 159/107 (!) 160/76  Pulse: (!) 141 (!) 127  Resp:    Temp: 98.3 F (36.8 C) 99.1 F (37.3 C)  SpO2: 98% 97%    Subj on day of d/c   EOMI NCAT some verbal but a little confused  General Exam on discharge  See prior assessment  Discharge Instructions   Discharge Instructions     Diet - low sodium heart healthy   Complete by: As directed    Discharge wound care:   Complete by: As directed    01/03/23 0500    Wound care  Every other day    Comments: Clean left lower leg wound with NS, apply Xeroform to entire area every other day, cover with ABD pad and secure with Kerlix roll gauze beginning just above toes and ending right below knee.  Cover with Ace wrap in same fashion as Kerlix for some light compression.   Increase activity slowly   Complete by: As directed       Allergies as of 01/05/2023       Reactions   Codeine Hypertension, Other (See Comments)   Causes a sensation of "pressure" within her head Other Reaction(s): Not available, Other, Unknown Other Reaction(s): Hypertension    Causes a sensation of "pressure" within her head    Causes a sensation of "pressure" within her head Causes a sensation of "pressure" within her head Causes a sensation of "pressure" within her head   Latex Itching   Other Reaction(s): Unknown   Tape Itching   Wound Dressing Adhesive Itching        Medication List     STOP taking these medications  aspirin EC 81 MG tablet   diphenoxylate-atropine 2.5-0.025 MG tablet Commonly known as: LOMOTIL   ezetimibe 10 MG tablet Commonly known as: ZETIA   ferrous sulfate 325 (65 FE) MG EC tablet   gabapentin 100 MG capsule Commonly known as: NEURONTIN   nitroGLYCERIN 0.4 MG SL tablet Commonly known as: NITROSTAT   oxybutynin 15 MG 24 hr tablet Commonly known as: DITROPAN XL       TAKE these medications     levETIRAcetam 500 MG tablet Commonly known as: KEPPRA Take 1 tablet (500 mg total) by mouth 2 (two) times daily. What changed: when to take this   LORazepam 1 MG tablet Commonly known as: ATIVAN Take 1 tablet (1 mg total) by mouth every 4 (four) hours as needed for anxiety.   morphine CONCENTRATE 10 MG/0.5ML Soln concentrated solution Take 0.25 mLs (5 mg total) by mouth every 2 (two) hours as needed for moderate pain (or dyspnea).   OLANZapine zydis 5 MG disintegrating tablet Commonly known as: ZYPREXA Take 1 tablet (5 mg total) by mouth daily. Start taking on: 2023/01/25               Discharge Care Instructions  (From admission, onward)           Start     Ordered   01/05/23 0000  Discharge wound care:       Comments: 01/03/23 0500    Wound care  Every other day    Comments: Clean left lower leg wound with NS, apply Xeroform to entire area every other day, cover with ABD pad and secure with Kerlix roll gauze beginning just above toes and ending right below knee.  Cover with Ace wrap in same fashion as Kerlix for some light compression.   01/05/23 1628           Allergies  Allergen Reactions   Codeine Hypertension and Other (See Comments)    Causes a sensation of "pressure" within her head  Other Reaction(s): Not available, Other, Unknown  Other Reaction(s): Hypertension    Causes a sensation of "pressure" within her head    Causes a sensation of "pressure" within her head Causes a sensation of "pressure" within her head Causes a sensation of "pressure" within her head   Latex Itching    Other Reaction(s): Unknown   Tape Itching   Wound Dressing Adhesive Itching      The results of significant diagnostics from this hospitalization (including imaging, microbiology, ancillary and laboratory) are listed below for reference.    Significant Diagnostic Studies: MR CERVICAL SPINE W WO CONTRAST  Result Date: 01/04/2023 CLINICAL DATA:  Neck  pain, fever, rule out abcess EXAM: MRI CERVICAL SPINE WITHOUT AND WITH CONTRAST TECHNIQUE: Multiplanar and multiecho pulse sequences of the cervical spine, to include the craniocervical junction and cervicothoracic junction, were obtained without and with intravenous contrast. CONTRAST:  4mL GADAVIST GADOBUTROL 1 MMOL/ML IV SOLN COMPARISON:  None Available. FINDINGS: Alignment: Physiologic. Vertebrae: No fracture. There is T2/stir hyperintense signal in the inferior and superior endplates of C6 and C7. There is also contrast enhancement associated with the vertebral bodies. Contrast enhancement is also present within the C6-C7 disc space. Similar findings are also seen at the C6 and C7 spinous processes (series 13, image 8). Cord: Normal signal and morphology. Posterior Fossa, vertebral arteries, paraspinal tissues: Negative Disc levels: There is moderate spinal canal narrowing at C3-C4. IMPRESSION: 1. Contrast enhancement associated with the C6 and C7 vertebral bodies and C6-C7 disc  space, as well as the C6 and C7 spinous processes, is concerning for early discitis/osteomyelitis. No evidence of epidural abscess. Recommend correlation with ESR and CRP. 2. Moderate spinal canal narrowing at C3-C4. Electronically Signed   By: Lorenza Cambridge M.D.   On: 01/04/2023 17:08   DG FL GUIDED LUMBAR PUNCTURE  Result Date: 01/04/2023 CLINICAL DATA:  89 year old female with confusion. Request for lumbar puncture to rule out meningitis. EXAM: LUMBAR PUNCTURE UNDER FLUOROSCOPY PROCEDURE: An appropriate skin entry site was determined fluoroscopically. Operator donned sterile gloves and mask. Skin site was marked, then prepped with Betadine, draped in usual sterile fashion, and infiltrated locally with 1% lidocaine. A 20 gauge spinal needle advanced into the thecal sac at L5-S1 from a right interlaminar approach. Clear colorless CSF spontaneously returned, with opening pressure of 17 cm water. 12 ml CSF were collected and divided  among 4 sterile vials for the requested laboratory studies. The needle was then removed. The patient tolerated the procedure well and there were no complications. FLUOROSCOPY: Radiation Exposure Index (as provided by the fluoroscopic device): 14.1 mGy Kerma IMPRESSION: Technically successful lumbar puncture under fluoroscopy. This exam was performed by Loyce Dys PA-C, and was supervised and interpreted by Acquanetta Belling, MD. Electronically Signed   By: Acquanetta Belling M.D.   On: 01/04/2023 15:12   Overnight EEG with video  Result Date: 01/03/2023 Charlsie Quest, MD     01/04/2023  9:47 AM Patient Name: [name] MRN: [id number] Epilepsy Attending: Charlsie Quest Referring Physician/Provider: Kara Mead, NP Duration: 01/02/2023 1815 to 01/03/2023 1815 Patient history: 89 y.o. female with PMHx of chronic diastolic heart failure, HTN, OSA on CPAP, and seizure disorder on home Keppra who presented to the ED on 9/8 for evaluation of acute alteration in mental status with fatigue and confusion and was found to be septic. EEG to evaluate for seizure. Level of alertness: Awake, asleep AEDs during EEG study: LEV Technical aspects: This EEG study was done with scalp electrodes positioned according to the 10-20 International system of electrode placement. Electrical activity was reviewed with band pass filter of 1-70Hz , sensitivity of 7 uV/mm, display speed of 69mm/sec with a 60Hz  notched filter applied as appropriate. EEG data were recorded continuously and digitally stored.  Video monitoring was available and reviewed as appropriate. Description:  The posterior dominant rhythm consists of 7 Hz activity of moderate voltage (25-35 uV) seen predominantly in posterior head regions, asymmetric ( left<right) and reactive to eye opening and eye closing. Sleep was characterized by vertex waves, sleep spindles (12 to 14 Hz), maximal frontocentral region. EEG showed continuous low amplitude 2-3hz  delta slowing as  well as 6-7hz  theta slowing in right hemisphere. Generalized and maximal right frontal spikes were noted. Hyperventilation and photic stimulation were not performed.    ABNORMALITY - Spikes, generalized and maximal right frontal - Continuous slow, generalized and lateralized left hemisphere - Background asymmetry, left<right  IMPRESSION: This study showed evidence of epileptogenicity with generalized and maximal right frontal onset. Additionally there was cortical dysfunction arising from left hemisphere likely secondary to underlying structural abnormality, post-ictal state. Additionally there is mild diffuse encephalopathy, nonspecific etiology. No seizures were seen throughout the recording.  Charlsie Quest    [place] EKG SITE RITE  Result Date: 01/02/2023 If Site Rite image not attached, placement could not be confirmed due to current cardiac rhythm.  DG CHEST PORT 1 VIEW  Result Date: 01/02/2023 CLINICAL DATA:  Aspiration. EXAM: PORTABLE CHEST 1 VIEW COMPARISON:  12/31/2022. FINDINGS: Heart is enlarged and the mediastinal contour is stable. There is atherosclerotic calcification of the aorta. Hyperinflation of the lungs is noted. No consolidation, effusion, or pneumothorax. No acute osseous abnormality is seen. IMPRESSION: No active disease. Electronically Signed   By: Thornell Sartorius M.D.   On: 01/02/2023 01:35   EEG adult  Result Date: 01/01/2023 Charlsie Quest, MD     01/01/2023  7:02 PM Patient Name: [name] MRN: [id number] Epilepsy Attending: Charlsie Quest Referring Physician/Provider: Rolly Salter, MD Date: 01/01/2023 Duration: 27.24 mins Patient history: 89yo F with aphasia, anomia, left gaze preference. EEG to evaluate for seizure. Level of alertness: Awake AEDs during EEG study: LEV Technical aspects: This EEG study was done with scalp electrodes positioned according to the 10-20 International system of electrode placement. Electrical activity was reviewed with band pass filter of  1-70Hz , sensitivity of 7 uV/mm, display speed of 70mm/sec with a 60Hz  notched filter applied as appropriate. EEG data were recorded continuously and digitally stored.  Video monitoring was available and reviewed as appropriate. Description: The posterior dominant rhythm consists of 7 Hz activity of moderate voltage (25-35 uV) seen predominantly in posterior head regions, asymmetric ( left<right) and reactive to eye opening and eye closing. EEG showed continuous low amplitude 2-3hz  delta slowing as well as 6-7hz  theta slowing in right hemisphere Sharp transients were noted in right frontal region. Hyperventilation and photic stimulation were not performed.   ABNORMALITY - Continuous slow, generalized and lateralized left hemisphere - background asymmetry, left<right IMPRESSION: This study is cortical dysfunction arising from left hemisphere likely secondary to underlying structural abnormality, post-ictal state. Additionally there is mild diffuse encephalopathy, nonspecific etiology. No seizures or definite epileptiform discharges were seen throughout the recording. Charlsie Quest   ECHOCARDIOGRAM COMPLETE  Result Date: 01/01/2023    ECHOCARDIOGRAM REPORT   Patient Name:   [name] Reddick Date of Exam: 01/01/2023 Medical Rec #:  [id number]         Height:       58.0 in Accession #:    [id number]        Weight:       106.5 lb Date of Birth:  [birth year]         BSA:          1.393 m Patient Age:    89 years          BP:           151/87 mmHg Patient Gender: F                 HR:           75 bpm. Exam Location:  Inpatient Procedure: 2D Echo, Color Doppler and Cardiac Doppler Indications:    Afib  History:        Patient has prior history of Echocardiogram examinations, most                 recent 12/20/2019. Previous Myocardial Infarction, Lactic                 Acidosis, Arrythmias:Atrial Fibrillation; Risk                 Factors:Hypertension and Dyslipidemia.  Sonographer:    Milbert Coulter Referring Phys: [id number]  CHING T TU IMPRESSIONS  1. Difficult acoustic windows. EF challenging with an atrial arrhythmia. Left ventricular ejection fraction, by estimation, is 40 to 45%. The left ventricle has mildly decreased function. The left ventricle  demonstrates global hypokinesis. Left ventricular diastolic parameters are indeterminate.  2. Right ventricular systolic function is normal. The right ventricular size is normal.  3. Left atrial size was severely dilated.  4. Right atrial size was severely dilated.  5. The mitral valve is degenerative. No evidence of mitral valve regurgitation.  6. The aortic valve is tricuspid. Aortic valve regurgitation is not visualized.  7. The inferior vena cava is normal in size with <50% respiratory variability, suggesting right atrial pressure of 8 mmHg. FINDINGS  Left Ventricle: Difficult acoustic windows. EF challenging with an atrial arrhythmia. Left ventricular ejection fraction, by estimation, is 40 to 45%. The left ventricle has mildly decreased function. The left ventricle demonstrates global hypokinesis. The left ventricular internal cavity size was normal in size. There is no left ventricular hypertrophy. Left ventricular diastolic parameters are indeterminate. Right Ventricle: The right ventricular size is normal. Right ventricular systolic function is normal. Left Atrium: Left atrial size was severely dilated. Right Atrium: Right atrial size was severely dilated. Pericardium: There is no evidence of pericardial effusion. Mitral Valve: The mitral valve is degenerative in appearance. No evidence of mitral valve regurgitation. Tricuspid Valve: The tricuspid valve is grossly normal. Tricuspid valve regurgitation is mild. Aortic Valve: The aortic valve is tricuspid. Aortic valve regurgitation is not visualized. Aortic valve mean gradient measures 3.0 mmHg. Aortic valve peak gradient measures 4.6 mmHg. Aortic valve area, by VTI measures 1.47 cm. Pulmonic Valve: Pulmonic valve regurgitation is  not visualized. Aorta: The aortic root is normal in size and structure. Venous: The inferior vena cava is normal in size with less than 50% respiratory variability, suggesting right atrial pressure of 8 mmHg. IAS/Shunts: No atrial level shunt detected by color flow Doppler.  LEFT VENTRICLE PLAX 2D LVIDd:         3.90 cm   Diastology LVIDs:         3.40 cm   LV e' medial:    5.44 cm/s LV PW:         0.70 cm   LV E/e' medial:  20.8 LV IVS:        0.80 cm   LV e' lateral:   7.72 cm/s LVOT diam:     1.70 cm   LV E/e' lateral: 14.6 LV SV:         31 LV SV Index:   22 LVOT Area:     2.27 cm  RIGHT VENTRICLE RV Basal diam:  3.20 cm RV Mid diam:    2.30 cm RV S prime:     10.70 cm/s TAPSE (M-mode): 1.6 cm LEFT ATRIUM             Index        RIGHT ATRIUM           Index LA diam:        3.00 cm 2.15 cm/m   RA Area:     17.30 cm LA Vol (A2C):   78.8 ml 56.56 ml/m  RA Volume:   45.20 ml  32.44 ml/m LA Vol (A4C):   47.0 ml 33.74 ml/m LA Biplane Vol: 66.4 ml 47.66 ml/m  AORTIC VALVE AV Area (Vmax):    1.56 cm AV Area (Vmean):   1.38 cm AV Area (VTI):     1.47 cm AV Vmax:           107.00 cm/s AV Vmean:          74.700 cm/s AV VTI:  0.208 m AV Peak Grad:      4.6 mmHg AV Mean Grad:      3.0 mmHg LVOT Vmax:         73.70 cm/s LVOT Vmean:        45.400 cm/s LVOT VTI:          0.135 m LVOT/AV VTI ratio: 0.65  AORTA Ao Root diam: 3.10 cm MITRAL VALVE MV Area (PHT): 5.23 cm     SHUNTS MV Decel Time: 145 msec     Systemic VTI:  0.14 m MV E velocity: 113.00 cm/s  Systemic Diam: 1.70 cm MV A velocity: 45.40 cm/s MV E/A ratio:  2.49 [place] signed by Carolan Clines Signature Date/Time: 01/01/2023/4:10:28 PM    Final    MR BRAIN WO CONTRAST  Result Date: 01/01/2023 CLINICAL DATA:  Neuro deficit, acute, stroke suspected. Mental status change. EXAM: MRI HEAD WITHOUT CONTRAST TECHNIQUE: Multiplanar, multiecho pulse sequences of the brain and surrounding structures were obtained without intravenous  contrast. COMPARISON:  Head CT 12/14/2019 FINDINGS: Brain: Diffusion imaging does not show any acute or subacute infarction. Mild chronic small-vessel ischemic changes affect the pons. No focal cerebellar insult. Cerebral hemispheres show moderate chronic small-vessel ischemic changes of the white matter. No large vessel territory stroke. No mass, recent hemorrhage, hydrocephalus or extra-axial collection. Vascular: Major vessels at the base of the brain show flow. Skull and upper cervical spine: Negative Sinuses/Orbits: Clear/normal Other: None IMPRESSION: No acute or reversible finding. Moderate chronic small-vessel ischemic changes of the pons and cerebral hemispheric white matter. Electronically Signed   By: Paulina Fusi M.D.   On: 01/01/2023 15:29   DG Chest Port 1 View  Result Date: 12/31/2022 CLINICAL DATA:  [id number] with weakness and confusion. EXAM: PORTABLE CHEST 1 VIEW COMPARISON:  Portable chest 03/17/2021 FINDINGS: The heart is moderately enlarged. Stable mediastinum with aortic tortuosity and calcific plaques. There is mild perihilar vascular congestion. There is mild interstitial consolidation similar to the prior study, difficult to say if this is due to interstitial edema or chronic interstitial lung disease but there is no significant pleural effusion. The lungs are emphysematous without evidence of focal infiltrates. There is osteopenia and chronic healed fracture deformity of the proximal right clavicle. No new osseous findings.  Overlying monitor wires. IMPRESSION: 1. Cardiomegaly with mild perihilar vascular congestion. 2. Mild interstitial consolidation similar to the prior study, difficult to say if this is due to interstitial edema or chronic interstitial lung disease but there is no substantial pleural effusion. 3. Emphysema. 4. Aortic atherosclerosis. Electronically Signed   By: Almira Bar M.D.   On: 12/31/2022 21:22    Microbiology: Recent Results (from the past 240 hour(s))   Culture, blood (Routine x 2)     Status: Abnormal   Collection Time: 12/31/22  8:25 PM   Specimen: BLOOD  Result Value Ref Range Status   Specimen Description   Final    BLOOD Performed at [facility], [address] [place], [place] [address]    Special Requests   Final    Blood Culture adequate volume BOTTLES DRAWN AEROBIC AND ANAEROBIC Performed at [facility], [address], [place], [place] [address]    Culture  Setup Time   Final    GRAM POSITIVE RODS AEROBIC BOTTLE ONLY CRITICAL RESULT CALLED TO, READ BACK BY AND VERIFIED WITH: A. PHAM PHARMD, AT 1420 01/02/23 D. VANHOOK    Culture (A)  Final    DIPHTHEROIDS(CORYNEBACTERIUM SPECIES) Standardized  susceptibility testing for this organism is not available. Performed at [facility], [address], [place], [place] [address]    Report Status 01/05/2023 FINAL  Final  Culture, blood (Routine x 2)     Status: None (Preliminary result)   Collection Time: 12/31/22  8:32 PM   Specimen: BLOOD  Result Value Ref Range Status   Specimen Description   Final    BLOOD RIGHT ANTECUBITAL Performed at [facility], [address], [place], [place] [address]    Special Requests   Final    Blood Culture adequate volume BOTTLES DRAWN AEROBIC AND ANAEROBIC Performed at [facility], [address], [place], [place] [address]    Culture   Final    NO GROWTH 4 DAYS Performed at [facility], [address], [place], [place] [address]    Report Status PENDING  Incomplete  Resp panel by RT-PCR (RSV, Flu A&B, Covid) Anterior Nasal Swab     Status: None   Collection Time: 12/31/22  8:45 PM   Specimen: Anterior Nasal Swab  Result Value Ref Range Status   SARS Coronavirus 2 by RT PCR NEGATIVE NEGATIVE Final    Comment: (NOTE) SARS-CoV-2 target nucleic acids are NOT DETECTED.  The SARS-CoV-2 RNA is generally detectable in upper respiratory specimens  during the acute phase of infection. The lowest concentration of SARS-CoV-2 viral copies this assay can detect is 138 copies/mL. A negative result does not preclude SARS-Cov-2 infection and should not be used as the sole basis for treatment or other patient management decisions. A negative result may occur with  improper specimen collection/handling, submission of specimen other than nasopharyngeal swab, presence of viral mutation(s) within the areas targeted by this assay, and inadequate number of viral copies(<138 copies/mL). A negative result must be combined with clinical observations, patient history, and epidemiological information. The expected result is Negative.  Fact Sheet for Patients:  BloggerCourse.com  Fact Sheet for Healthcare Providers:  SeriousBroker.it  This test is no t yet approved or cleared by the [place] FDA and  has been authorized for detection and/or diagnosis of SARS-CoV-2 by FDA under an Emergency Use Authorization (EUA). This EUA will remain  in effect (meaning this test can be used) for the duration of the COVID-19 declaration under Section 564(b)(1) of the Act, 21 U.S.C.section 360bbb-3(b)(1), unless the authorization is terminated  or revoked sooner.       Influenza A by PCR NEGATIVE NEGATIVE Final   Influenza B by PCR NEGATIVE NEGATIVE Final    Comment: (NOTE) The Xpert Xpress SARS-CoV-2/FLU/RSV plus assay is intended as an aid in the diagnosis of influenza from Nasopharyngeal swab specimens and should not be used as a sole basis for treatment. Nasal washings and aspirates are unacceptable for Xpert Xpress SARS-CoV-2/FLU/RSV testing.  Fact Sheet for Patients: BloggerCourse.com  Fact Sheet for Healthcare Providers: SeriousBroker.it  This test is not yet approved or cleared by the [place] FDA and has been authorized for detection  and/or diagnosis of SARS-CoV-2 by FDA under an Emergency Use Authorization (EUA). This EUA will remain in effect (meaning this test can be used) for the duration of the COVID-19 declaration under Section 564(b)(1) of the Act, 21 U.S.C. section 360bbb-3(b)(1), unless the authorization is terminated or revoked.     Resp Syncytial Virus by PCR NEGATIVE NEGATIVE Final    Comment: (NOTE) Fact Sheet for Patients: BloggerCourse.com  Fact Sheet for Healthcare Providers: SeriousBroker.it  This test is not yet  approved or cleared by the [place] and has been authorized for detection and/or diagnosis of SARS-CoV-2 by FDA under an Emergency Use Authorization (EUA). This EUA will remain in effect (meaning this test can be used) for the duration of the COVID-19 declaration under Section 564(b)(1) of the Act, 21 U.S.C. section 360bbb-3(b)(1), unless the authorization is terminated or revoked.  Performed at [facility], [address], [place], [place] [address]   Urine Culture     Status: Abnormal   Collection Time: 12/31/22  9:31 PM   Specimen: Urine, Catheterized  Result Value Ref Range Status   Specimen Description   Final    URINE, CATHETERIZED Performed at [facility], [address] [place], [place] [address]    Special Requests   Final    NONE Performed at [facility], [address] [place], [place] [address]    Culture MULTIPLE SPECIES PRESENT, SUGGEST RECOLLECTION (A)  Final   Report Status 01/02/2023 FINAL  Final  Respiratory (~20 pathogens) panel by PCR     Status: None   Collection Time: 01/01/23  2:41 AM   Specimen: Nasopharyngeal Swab; Respiratory  Result Value Ref Range Status   Adenovirus NOT DETECTED NOT DETECTED Final   Coronavirus 229E NOT DETECTED NOT DETECTED Final    Comment: (NOTE) The Coronavirus on the Respiratory Panel, DOES NOT test for  the novel  Coronavirus (2019 nCoV)    Coronavirus HKU1 NOT DETECTED NOT DETECTED Final   Coronavirus NL63 NOT DETECTED NOT DETECTED Final   Coronavirus OC43 NOT DETECTED NOT DETECTED Final   Metapneumovirus NOT DETECTED NOT DETECTED Final   Rhinovirus / Enterovirus NOT DETECTED NOT DETECTED Final   Influenza A NOT DETECTED NOT DETECTED Final   Influenza B NOT DETECTED NOT DETECTED Final   Parainfluenza Virus 1 NOT DETECTED NOT DETECTED Final   Parainfluenza Virus 2 NOT DETECTED NOT DETECTED Final   Parainfluenza Virus 3 NOT DETECTED NOT DETECTED Final   Parainfluenza Virus 4 NOT DETECTED NOT DETECTED Final   Respiratory Syncytial Virus NOT DETECTED NOT DETECTED Final   Bordetella pertussis NOT DETECTED NOT DETECTED Final   Bordetella Parapertussis NOT DETECTED NOT DETECTED Final   Chlamydophila pneumoniae NOT DETECTED NOT DETECTED Final   Mycoplasma pneumoniae NOT DETECTED NOT DETECTED Final    Comment: Performed at [facility], [address] [place], [place] [address]  Gastrointestinal Panel by PCR , Stool     Status: None   Collection Time: 01/02/23  6:07 AM   Specimen: Stool  Result Value Ref Range Status   Campylobacter species NOT DETECTED NOT DETECTED Final   Plesimonas shigelloides NOT DETECTED NOT DETECTED Final   Salmonella species NOT DETECTED NOT DETECTED Final   Yersinia enterocolitica NOT DETECTED NOT DETECTED Final   Vibrio species NOT DETECTED NOT DETECTED Final   Vibrio cholerae NOT DETECTED NOT DETECTED Final   Enteroaggregative E coli (EAEC) NOT DETECTED NOT DETECTED Final   Enteropathogenic E coli (EPEC) NOT DETECTED NOT DETECTED Final   Enterotoxigenic E coli (ETEC) NOT DETECTED NOT DETECTED Final   Shiga like toxin producing E coli (STEC) NOT DETECTED NOT DETECTED Final   Shigella/Enteroinvasive E coli (EIEC) NOT DETECTED NOT DETECTED Final   Cryptosporidium NOT DETECTED NOT DETECTED Final   Cyclospora cayetanensis NOT DETECTED NOT DETECTED Final    Entamoeba histolytica NOT DETECTED NOT DETECTED Final   Giardia lamblia NOT DETECTED NOT DETECTED Final   Adenovirus F40/41 NOT DETECTED NOT  DETECTED Final   Astrovirus NOT DETECTED NOT DETECTED Final   Norovirus GI/GII NOT DETECTED NOT DETECTED Final   Rotavirus A NOT DETECTED NOT DETECTED Final   Sapovirus (I, II, IV, and V) NOT DETECTED NOT DETECTED Final    Comment: Performed at [facility], [address] [place], [place] [address]  C Difficile Quick Screen w PCR reflex     Status: None   Collection Time: 01/02/23  6:07 AM   Specimen: Stool  Result Value Ref Range Status   C Diff antigen NEGATIVE NEGATIVE Final   C Diff toxin NEGATIVE NEGATIVE Final   C Diff interpretation No C. difficile detected.  Final    Comment: Performed at [facility], [address] [place], [place] [address]  CSF culture w Gram Stain     Status: None (Preliminary result)   Collection Time: 01/04/23  3:16 PM   Specimen: PATH Cytology CSF; Cerebrospinal Fluid  Result Value Ref Range Status   Specimen Description CSF  Final   Special Requests NONE  Final   Gram Stain   Final    WBC PRESENT, PREDOMINANTLY PMN NO ORGANISMS SEEN CYTOSPIN SMEAR    Culture   Final    NO GROWTH < 24 HOURS Performed at [facility], [address], [place], [place] [address]    Report Status PENDING  Incomplete     Labs: Basic Metabolic Panel: Recent Labs  Lab 12/31/22 2032 01/01/23 0509 01/02/23 0821 01/03/23 0755 01/04/23 0951 01/05/23 1217  NA 134* 132* 132* 135 136  --   K 3.5 3.1* 4.2 2.3* 2.8*  --   CL 97* 96* 98 101 102  --   CO2 25 22 19* 23 20*  --   GLUCOSE 118* 93 110* 124* 184*  --   BUN 14 11 17 22 13   --   CREATININE 0.60 0.59 0.63 0.73 0.64  --   CALCIUM 9.5 8.5* 8.5* 8.4* 8.2*  --   MG  --   --  1.9  --   --  1.6*   Liver Function Tests: Recent Labs  Lab 12/31/22 2032 01/03/23 0755  AST 32 20  ALT 19 14  ALKPHOS 95 56  BILITOT 1.3* 0.7  PROT 8.3*  5.7*  ALBUMIN 4.6 3.0*   No results for input(s): "LIPASE", "AMYLASE" in the last 168 hours. Recent Labs  Lab 01/03/23 0755  AMMONIA 31   CBC: Recent Labs  Lab 12/31/22 2032 01/01/23 0509 01/02/23 0821 01/03/23 0755  WBC 9.2 8.7 9.4 7.5  NEUTROABS 7.6  --   --  5.8  HGB 12.9 13.1 14.0 11.6*  HCT 40.3 39.4 42.7 34.1*  MCV 91.4 90.6 89.7 89.5  PLT 165 145* 125* 130*   Cardiac Enzymes: Recent Labs  Lab 01/01/23 0811  CKTOTAL 130   BNP: BNP (last 3 results) Recent Labs    12/31/22 2233  BNP 496.1*    ProBNP (last 3 results) No results for input(s): "PROBNP" in the last 8760 hours.  CBG: Recent Labs  Lab 01/04/23 1959 01/05/23 0001 01/05/23 0350 01/05/23 0812 01/05/23 1207  GLUCAP 101* 96 82 72 86       Signed:  [name] MD   [facility] 01/05/2023, 4:29 PM
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
## 2023-01-05 NOTE — Progress Notes (Signed)
Heart Failure Navigator Progress Note  Assessed for Heart & Vascular TOC clinic readiness.  Patient does not meet criteria due to requiring SNF at discharge and ongoing cognitive issues per MD.   Navigator will sign off at this time.  Nichol Ator,RN, BSN,MSN Heart Failure Nurse Navigator. Contact by secure chat only.

## 2023-01-05 NOTE — Progress Notes (Signed)
[facility] called for patient transport to [facility]

## 2023-01-06 LAB — CULTURE, BLOOD (ROUTINE X 2)
Culture: NO GROWTH
Special Requests: ADEQUATE

## 2023-01-07 LAB — VITAMIN B1: Vitamin B1 (Thiamine): 93.3 nmol/L (ref 66.5–200.0)

## 2023-01-08 LAB — CSF CULTURE W GRAM STAIN: Culture: NO GROWTH

## 2023-01-10 ENCOUNTER — Ambulatory Visit (HOSPITAL_BASED_OUTPATIENT_CLINIC_OR_DEPARTMENT_OTHER): Payer: Medicare Other | Admitting: Physician Assistant

## 2023-01-17 ENCOUNTER — Ambulatory Visit (HOSPITAL_BASED_OUTPATIENT_CLINIC_OR_DEPARTMENT_OTHER): Payer: Medicare Other | Admitting: Physician Assistant

## 2023-01-23 DEATH — deceased

## 2023-02-01 ENCOUNTER — Ambulatory Visit: Payer: Medicare Other | Admitting: Cardiology
